# Patient Record
Sex: Male | Born: 1935 | Race: White | Hispanic: No | Marital: Married | Smoking: Former smoker
Health system: Southern US, Community
[De-identification: ages and names within clinical notes are randomized; demographics above are authoritative.]

## PROBLEM LIST (undated history)

## (undated) DIAGNOSIS — I471 Supraventricular tachycardia, unspecified: Secondary | ICD-10-CM

## (undated) DIAGNOSIS — K219 Gastro-esophageal reflux disease without esophagitis: Secondary | ICD-10-CM

## (undated) DIAGNOSIS — E785 Hyperlipidemia, unspecified: Secondary | ICD-10-CM

## (undated) DIAGNOSIS — K222 Esophageal obstruction: Secondary | ICD-10-CM

## (undated) DIAGNOSIS — I10 Essential (primary) hypertension: Secondary | ICD-10-CM

## (undated) DIAGNOSIS — N400 Enlarged prostate without lower urinary tract symptoms: Secondary | ICD-10-CM

## (undated) DIAGNOSIS — I2510 Atherosclerotic heart disease of native coronary artery without angina pectoris: Secondary | ICD-10-CM

## (undated) DIAGNOSIS — I6529 Occlusion and stenosis of unspecified carotid artery: Secondary | ICD-10-CM

## (undated) DIAGNOSIS — D126 Benign neoplasm of colon, unspecified: Secondary | ICD-10-CM

## (undated) DIAGNOSIS — K4090 Unilateral inguinal hernia, without obstruction or gangrene, not specified as recurrent: Secondary | ICD-10-CM

## (undated) DIAGNOSIS — N529 Male erectile dysfunction, unspecified: Secondary | ICD-10-CM

## (undated) DIAGNOSIS — K5790 Diverticulosis of intestine, part unspecified, without perforation or abscess without bleeding: Secondary | ICD-10-CM

## (undated) DIAGNOSIS — K589 Irritable bowel syndrome without diarrhea: Secondary | ICD-10-CM

## (undated) DIAGNOSIS — E119 Type 2 diabetes mellitus without complications: Secondary | ICD-10-CM

## (undated) DIAGNOSIS — K449 Diaphragmatic hernia without obstruction or gangrene: Secondary | ICD-10-CM

## (undated) DIAGNOSIS — F419 Anxiety disorder, unspecified: Secondary | ICD-10-CM

## (undated) HISTORY — DX: Diaphragmatic hernia without obstruction or gangrene: K44.9

## (undated) HISTORY — PX: INGUINAL HERNIA REPAIR: SUR1180

## (undated) HISTORY — DX: Diverticulosis of intestine, part unspecified, without perforation or abscess without bleeding: K57.90

## (undated) HISTORY — DX: Supraventricular tachycardia: I47.1

## (undated) HISTORY — DX: Anxiety disorder, unspecified: F41.9

## (undated) HISTORY — DX: Atherosclerotic heart disease of native coronary artery without angina pectoris: I25.10

## (undated) HISTORY — DX: Benign neoplasm of colon, unspecified: D12.6

## (undated) HISTORY — DX: Gastro-esophageal reflux disease without esophagitis: K21.9

## (undated) HISTORY — DX: Esophageal obstruction: K22.2

## (undated) HISTORY — DX: Irritable bowel syndrome, unspecified: K58.9

## (undated) HISTORY — DX: Benign prostatic hyperplasia without lower urinary tract symptoms: N40.0

## (undated) HISTORY — DX: Occlusion and stenosis of unspecified carotid artery: I65.29

## (undated) HISTORY — DX: Essential (primary) hypertension: I10

## (undated) HISTORY — DX: Supraventricular tachycardia, unspecified: I47.10

## (undated) HISTORY — DX: Male erectile dysfunction, unspecified: N52.9

## (undated) HISTORY — DX: Hyperlipidemia, unspecified: E78.5

## (undated) HISTORY — DX: Type 2 diabetes mellitus without complications: E11.9

## (undated) HISTORY — DX: Unilateral inguinal hernia, without obstruction or gangrene, not specified as recurrent: K40.90

---

## 2004-05-18 DIAGNOSIS — D126 Benign neoplasm of colon, unspecified: Secondary | ICD-10-CM

## 2004-05-18 HISTORY — DX: Benign neoplasm of colon, unspecified: D12.6

## 2004-06-14 LAB — HM COLONOSCOPY

## 2004-11-28 ENCOUNTER — Ambulatory Visit: Payer: Self-pay | Admitting: Internal Medicine

## 2004-12-17 ENCOUNTER — Ambulatory Visit: Payer: Self-pay | Admitting: Internal Medicine

## 2005-01-09 ENCOUNTER — Ambulatory Visit: Payer: Self-pay | Admitting: Internal Medicine

## 2005-04-25 ENCOUNTER — Ambulatory Visit: Payer: Self-pay | Admitting: Internal Medicine

## 2005-07-25 ENCOUNTER — Ambulatory Visit: Payer: Self-pay | Admitting: Internal Medicine

## 2005-10-23 ENCOUNTER — Ambulatory Visit: Payer: Self-pay | Admitting: Internal Medicine

## 2006-07-08 ENCOUNTER — Ambulatory Visit: Payer: Self-pay | Admitting: Internal Medicine

## 2006-11-19 ENCOUNTER — Ambulatory Visit: Payer: Self-pay | Admitting: Internal Medicine

## 2006-11-19 LAB — CONVERTED CEMR LAB
AST: 25 units/L (ref 0–37)
Albumin: 3.9 g/dL (ref 3.5–5.2)
BUN: 17 mg/dL (ref 6–23)
Bilirubin Urine: NEGATIVE
CO2: 30 meq/L (ref 19–32)
Calcium: 9.3 mg/dL (ref 8.4–10.5)
Chol/HDL Ratio, serum: 4.4
Cholesterol: 148 mg/dL (ref 0–200)
Creatinine, Ser: 1.1 mg/dL (ref 0.4–1.5)
Eosinophil percent: 3.8 % (ref 0.0–5.0)
Glucose, Bld: 135 mg/dL — ABNORMAL HIGH (ref 70–99)
Hemoglobin: 15 g/dL (ref 13.0–17.0)
Hgb A1c MFr Bld: 6.3 % — ABNORMAL HIGH (ref 4.6–6.0)
LDL Cholesterol: 89 mg/dL (ref 0–99)
Lymphocytes Relative: 21.2 % (ref 12.0–46.0)
MCV: 86.7 fL (ref 78.0–100.0)
Monocytes Relative: 8.4 % (ref 3.0–11.0)
Neutro Abs: 3.9 10*3/uL (ref 1.4–7.7)
Neutrophils Relative %: 65.8 % (ref 43.0–77.0)
PSA: 2.38 ng/mL
Potassium: 4.2 meq/L (ref 3.5–5.1)
RBC: 5.22 M/uL (ref 4.22–5.81)
TSH: 1.67 microintl units/mL (ref 0.35–5.50)
Total Bilirubin: 0.8 mg/dL (ref 0.3–1.2)
Total Protein, Urine: NEGATIVE mg/dL
Urobilinogen, UA: 0.2 (ref 0.0–1.0)

## 2006-11-24 ENCOUNTER — Ambulatory Visit: Payer: Self-pay | Admitting: Internal Medicine

## 2006-11-28 ENCOUNTER — Ambulatory Visit: Payer: Self-pay

## 2006-11-28 ENCOUNTER — Ambulatory Visit: Payer: Self-pay | Admitting: Cardiovascular Disease

## 2006-12-15 ENCOUNTER — Ambulatory Visit: Payer: Self-pay | Admitting: Cardiovascular Disease

## 2007-07-22 ENCOUNTER — Ambulatory Visit: Payer: Self-pay | Admitting: Internal Medicine

## 2007-07-22 ENCOUNTER — Encounter: Payer: Self-pay | Admitting: Internal Medicine

## 2007-07-22 DIAGNOSIS — K219 Gastro-esophageal reflux disease without esophagitis: Secondary | ICD-10-CM | POA: Insufficient documentation

## 2007-07-22 DIAGNOSIS — E119 Type 2 diabetes mellitus without complications: Secondary | ICD-10-CM | POA: Insufficient documentation

## 2007-07-22 DIAGNOSIS — J310 Chronic rhinitis: Secondary | ICD-10-CM | POA: Insufficient documentation

## 2007-07-22 DIAGNOSIS — Z8719 Personal history of other diseases of the digestive system: Secondary | ICD-10-CM

## 2007-07-22 DIAGNOSIS — K5730 Diverticulosis of large intestine without perforation or abscess without bleeding: Secondary | ICD-10-CM | POA: Insufficient documentation

## 2007-07-22 DIAGNOSIS — E785 Hyperlipidemia, unspecified: Secondary | ICD-10-CM | POA: Insufficient documentation

## 2007-07-22 DIAGNOSIS — Z860100 Personal history of colon polyps, unspecified: Secondary | ICD-10-CM | POA: Insufficient documentation

## 2007-07-22 DIAGNOSIS — I10 Essential (primary) hypertension: Secondary | ICD-10-CM | POA: Insufficient documentation

## 2007-07-22 DIAGNOSIS — M545 Low back pain: Secondary | ICD-10-CM

## 2007-07-22 DIAGNOSIS — F528 Other sexual dysfunction not due to a substance or known physiological condition: Secondary | ICD-10-CM

## 2007-07-22 DIAGNOSIS — K589 Irritable bowel syndrome without diarrhea: Secondary | ICD-10-CM

## 2007-07-22 LAB — CONVERTED CEMR LAB
Crystals: NEGATIVE
Hemoglobin, Urine: NEGATIVE
Ketones, ur: NEGATIVE mg/dL
Leukocytes, UA: NEGATIVE
Mucus, UA: NEGATIVE
Urine Glucose: NEGATIVE mg/dL

## 2007-07-23 ENCOUNTER — Encounter: Payer: Self-pay | Admitting: Internal Medicine

## 2007-07-27 ENCOUNTER — Encounter: Admission: RE | Admit: 2007-07-27 | Discharge: 2007-07-27 | Payer: Self-pay | Admitting: Internal Medicine

## 2007-10-28 ENCOUNTER — Ambulatory Visit: Payer: Self-pay | Admitting: Internal Medicine

## 2007-10-28 DIAGNOSIS — F411 Generalized anxiety disorder: Secondary | ICD-10-CM | POA: Insufficient documentation

## 2007-10-28 DIAGNOSIS — K222 Esophageal obstruction: Secondary | ICD-10-CM | POA: Insufficient documentation

## 2007-10-28 DIAGNOSIS — Z87442 Personal history of urinary calculi: Secondary | ICD-10-CM | POA: Insufficient documentation

## 2007-11-26 ENCOUNTER — Ambulatory Visit: Payer: Self-pay | Admitting: Internal Medicine

## 2007-11-27 LAB — CONVERTED CEMR LAB
BUN: 24 mg/dL — ABNORMAL HIGH (ref 6–23)
Bilirubin, Direct: 0.1 mg/dL (ref 0.0–0.3)
CO2: 27 meq/L (ref 19–32)
Calcium: 9.2 mg/dL (ref 8.4–10.5)
Chloride: 99 meq/L (ref 96–112)
Direct LDL: 114.3 mg/dL
Eosinophils Absolute: 0.2 10*3/uL (ref 0.0–0.6)
Eosinophils Relative: 2.2 % (ref 0.0–5.0)
Glucose, Bld: 129 mg/dL — ABNORMAL HIGH (ref 70–99)
HCT: 46.1 % (ref 39.0–52.0)
Ketones, ur: NEGATIVE mg/dL
Leukocytes, UA: NEGATIVE
MCV: 86.7 fL (ref 78.0–100.0)
Neutro Abs: 4.8 10*3/uL (ref 1.4–7.7)
Neutrophils Relative %: 63.4 % (ref 43.0–77.0)
Nitrite: NEGATIVE
PSA: 2.1 ng/mL (ref 0.10–4.00)
Platelets: 188 10*3/uL (ref 150–400)
Potassium: 4.2 meq/L (ref 3.5–5.1)
Total Protein, Urine: NEGATIVE mg/dL
Urobilinogen, UA: 0.2 (ref 0.0–1.0)
VLDL: 57 mg/dL — ABNORMAL HIGH (ref 0–40)
pH: 6 (ref 5.0–8.0)

## 2007-11-30 ENCOUNTER — Ambulatory Visit: Payer: Self-pay | Admitting: Internal Medicine

## 2008-03-31 ENCOUNTER — Ambulatory Visit: Payer: Self-pay | Admitting: Internal Medicine

## 2008-03-31 DIAGNOSIS — R350 Frequency of micturition: Secondary | ICD-10-CM

## 2008-04-01 LAB — CONVERTED CEMR LAB
BUN: 26 mg/dL — ABNORMAL HIGH (ref 6–23)
CO2: 30 meq/L (ref 19–32)
Calcium: 9.7 mg/dL (ref 8.4–10.5)
Chloride: 104 meq/L (ref 96–112)
Creatinine, Ser: 1.1 mg/dL (ref 0.4–1.5)
Direct LDL: 115.7 mg/dL
Glucose, Bld: 310 mg/dL — ABNORMAL HIGH (ref 70–99)
Hgb A1c MFr Bld: 9.8 % — ABNORMAL HIGH (ref 4.6–6.0)
Ketones, ur: 15 mg/dL — AB
Triglycerides: 320 mg/dL (ref 0–149)
Urine Glucose: >=1000 mg/dL — CR
Urobilinogen, UA: 1 (ref 0.0–1.0)
VLDL: 64 mg/dL — ABNORMAL HIGH (ref 0–40)

## 2008-04-06 ENCOUNTER — Encounter: Payer: Self-pay | Admitting: Internal Medicine

## 2008-05-04 ENCOUNTER — Ambulatory Visit: Payer: Self-pay | Admitting: Internal Medicine

## 2008-05-04 DIAGNOSIS — M79609 Pain in unspecified limb: Secondary | ICD-10-CM

## 2008-06-08 ENCOUNTER — Ambulatory Visit: Payer: Self-pay | Admitting: Internal Medicine

## 2008-06-09 LAB — CONVERTED CEMR LAB
AST: 18 units/L (ref 0–37)
Albumin: 3.9 g/dL (ref 3.5–5.2)
BUN: 28 mg/dL — ABNORMAL HIGH (ref 6–23)
Bilirubin, Direct: 0.1 mg/dL (ref 0.0–0.3)
Chloride: 107 meq/L (ref 96–112)
Cholesterol: 112 mg/dL (ref 0–200)
Creatinine, Ser: 1.1 mg/dL (ref 0.4–1.5)
GFR calc non Af Amer: 70 mL/min
Hgb A1c MFr Bld: 7.5 % — ABNORMAL HIGH (ref 4.6–6.0)
Triglycerides: 105 mg/dL (ref 0–149)
VLDL: 21 mg/dL (ref 0–40)

## 2008-06-16 ENCOUNTER — Telehealth (INDEPENDENT_AMBULATORY_CARE_PROVIDER_SITE_OTHER): Payer: Self-pay | Admitting: *Deleted

## 2008-09-22 ENCOUNTER — Ambulatory Visit: Payer: Self-pay | Admitting: Gastroenterology

## 2008-10-06 ENCOUNTER — Encounter: Payer: Self-pay | Admitting: Internal Medicine

## 2008-10-06 ENCOUNTER — Ambulatory Visit: Payer: Self-pay | Admitting: Gastroenterology

## 2008-10-06 ENCOUNTER — Encounter: Payer: Self-pay | Admitting: Gastroenterology

## 2008-10-07 ENCOUNTER — Encounter: Payer: Self-pay | Admitting: Gastroenterology

## 2008-10-18 HISTORY — PX: CORONARY STENT PLACEMENT: SHX1402

## 2008-10-28 ENCOUNTER — Ambulatory Visit: Payer: Self-pay | Admitting: Internal Medicine

## 2008-10-28 DIAGNOSIS — K4090 Unilateral inguinal hernia, without obstruction or gangrene, not specified as recurrent: Secondary | ICD-10-CM | POA: Insufficient documentation

## 2008-11-02 DIAGNOSIS — I2510 Atherosclerotic heart disease of native coronary artery without angina pectoris: Secondary | ICD-10-CM

## 2008-11-02 DIAGNOSIS — I25119 Atherosclerotic heart disease of native coronary artery with unspecified angina pectoris: Secondary | ICD-10-CM | POA: Insufficient documentation

## 2008-11-02 HISTORY — DX: Atherosclerotic heart disease of native coronary artery with unspecified angina pectoris: I25.119

## 2008-11-03 ENCOUNTER — Encounter: Payer: Self-pay | Admitting: Internal Medicine

## 2008-11-03 ENCOUNTER — Encounter (INDEPENDENT_AMBULATORY_CARE_PROVIDER_SITE_OTHER): Payer: Self-pay | Admitting: *Deleted

## 2008-11-03 ENCOUNTER — Ambulatory Visit: Payer: Self-pay

## 2008-11-07 ENCOUNTER — Telehealth: Payer: Self-pay | Admitting: Internal Medicine

## 2008-11-07 DIAGNOSIS — R9439 Abnormal result of other cardiovascular function study: Secondary | ICD-10-CM | POA: Insufficient documentation

## 2008-11-22 ENCOUNTER — Ambulatory Visit: Payer: Self-pay | Admitting: Cardiovascular Disease

## 2008-11-22 LAB — CONVERTED CEMR LAB
Basophils Relative: 0.6 % (ref 0.0–3.0)
Chloride: 107 meq/L (ref 96–112)
Creatinine, Ser: 1.1 mg/dL (ref 0.4–1.5)
Eosinophils Absolute: 0.2 10*3/uL (ref 0.0–0.7)
Eosinophils Relative: 2.4 % (ref 0.0–5.0)
GFR calc Af Amer: 84 mL/min
Glucose, Bld: 138 mg/dL — ABNORMAL HIGH (ref 70–99)
HCT: 46.1 % (ref 39.0–52.0)
Hemoglobin: 16.1 g/dL (ref 13.0–17.0)
Lymphocytes Relative: 24.3 % (ref 12.0–46.0)
MCHC: 35 g/dL (ref 30.0–36.0)
MCV: 86.9 fL (ref 78.0–100.0)
Monocytes Relative: 10.2 % (ref 3.0–12.0)
Platelets: 165 10*3/uL (ref 150–400)
Prothrombin Time: 10.5 s — ABNORMAL LOW (ref 10.9–13.3)

## 2008-11-25 ENCOUNTER — Ambulatory Visit: Payer: Self-pay | Admitting: Cardiovascular Disease

## 2008-11-25 ENCOUNTER — Inpatient Hospital Stay (HOSPITAL_BASED_OUTPATIENT_CLINIC_OR_DEPARTMENT_OTHER): Admission: RE | Admit: 2008-11-25 | Discharge: 2008-11-25 | Payer: Self-pay | Admitting: Cardiovascular Disease

## 2008-12-07 ENCOUNTER — Encounter: Payer: Self-pay | Admitting: Internal Medicine

## 2008-12-09 ENCOUNTER — Ambulatory Visit: Payer: Self-pay | Admitting: Cardiovascular Disease

## 2008-12-13 ENCOUNTER — Ambulatory Visit: Payer: Self-pay | Admitting: Cardiovascular Disease

## 2008-12-13 ENCOUNTER — Inpatient Hospital Stay (HOSPITAL_COMMUNITY): Admission: RE | Admit: 2008-12-13 | Discharge: 2008-12-14 | Payer: Self-pay | Admitting: Cardiovascular Disease

## 2008-12-20 ENCOUNTER — Ambulatory Visit: Payer: Self-pay | Admitting: Internal Medicine

## 2008-12-21 LAB — CONVERTED CEMR LAB
ALT: 24 units/L (ref 0–53)
Albumin: 4 g/dL (ref 3.5–5.2)
Basophils Absolute: 0.1 10*3/uL (ref 0.0–0.1)
Basophils Relative: 1.1 % (ref 0.0–3.0)
Bilirubin Urine: NEGATIVE
Cholesterol: 116 mg/dL (ref 0–200)
Creatinine, Ser: 1.2 mg/dL (ref 0.4–1.5)
Creatinine,U: 166.4 mg/dL
GFR calc Af Amer: 76 mL/min
HCT: 47.3 % (ref 39.0–52.0)
Hemoglobin: 16.4 g/dL (ref 13.0–17.0)
LDL Cholesterol: 48 mg/dL (ref 0–99)
Leukocytes, UA: NEGATIVE
Lymphocytes Relative: 16.1 % (ref 12.0–46.0)
Microalb, Ur: 1.8 mg/dL (ref 0.0–1.9)
Neutrophils Relative %: 72.9 % (ref 43.0–77.0)
Nitrite: NEGATIVE
PSA: 2.91 ng/mL (ref 0.10–4.00)
RBC: 5.41 M/uL (ref 4.22–5.81)
Sodium: 140 meq/L (ref 135–145)
Total Bilirubin: 0.6 mg/dL (ref 0.3–1.2)
Total Protein, Urine: NEGATIVE mg/dL
Total Protein: 6.8 g/dL (ref 6.0–8.3)
Urine Glucose: NEGATIVE mg/dL
Urobilinogen, UA: 1 (ref 0.0–1.0)
VLDL: 39 mg/dL (ref 0–40)
WBC: 7.9 10*3/uL (ref 4.5–10.5)

## 2008-12-29 ENCOUNTER — Encounter: Payer: Self-pay | Admitting: Cardiovascular Disease

## 2008-12-29 ENCOUNTER — Ambulatory Visit: Payer: Self-pay | Admitting: Cardiovascular Disease

## 2009-01-31 ENCOUNTER — Telehealth (INDEPENDENT_AMBULATORY_CARE_PROVIDER_SITE_OTHER): Payer: Self-pay | Admitting: *Deleted

## 2009-07-19 ENCOUNTER — Telehealth: Payer: Self-pay | Admitting: Internal Medicine

## 2009-07-31 ENCOUNTER — Ambulatory Visit: Payer: Self-pay | Admitting: Internal Medicine

## 2009-07-31 ENCOUNTER — Ambulatory Visit: Payer: Self-pay | Admitting: Cardiovascular Disease

## 2009-07-31 DIAGNOSIS — M549 Dorsalgia, unspecified: Secondary | ICD-10-CM | POA: Insufficient documentation

## 2009-11-21 ENCOUNTER — Telehealth: Payer: Self-pay | Admitting: Cardiovascular Disease

## 2009-11-29 ENCOUNTER — Encounter: Payer: Self-pay | Admitting: Internal Medicine

## 2009-11-30 ENCOUNTER — Encounter: Payer: Self-pay | Admitting: Cardiovascular Disease

## 2009-12-19 ENCOUNTER — Telehealth (INDEPENDENT_AMBULATORY_CARE_PROVIDER_SITE_OTHER): Payer: Self-pay | Admitting: *Deleted

## 2009-12-21 ENCOUNTER — Telehealth (INDEPENDENT_AMBULATORY_CARE_PROVIDER_SITE_OTHER): Payer: Self-pay | Admitting: *Deleted

## 2009-12-21 ENCOUNTER — Ambulatory Visit: Payer: Self-pay | Admitting: Internal Medicine

## 2009-12-25 ENCOUNTER — Ambulatory Visit: Payer: Self-pay

## 2009-12-25 ENCOUNTER — Encounter (HOSPITAL_COMMUNITY): Admission: RE | Admit: 2009-12-25 | Discharge: 2010-02-21 | Payer: Self-pay | Admitting: Cardiovascular Disease

## 2009-12-25 ENCOUNTER — Ambulatory Visit: Payer: Self-pay | Admitting: Cardiology

## 2010-02-16 ENCOUNTER — Ambulatory Visit: Payer: Self-pay | Admitting: Internal Medicine

## 2010-03-14 ENCOUNTER — Emergency Department (HOSPITAL_COMMUNITY): Admission: EM | Admit: 2010-03-14 | Discharge: 2010-03-14 | Payer: Self-pay | Admitting: Emergency Medicine

## 2010-03-14 ENCOUNTER — Telehealth: Payer: Self-pay | Admitting: Internal Medicine

## 2010-03-15 ENCOUNTER — Ambulatory Visit: Payer: Self-pay | Admitting: Cardiovascular Disease

## 2010-04-19 ENCOUNTER — Ambulatory Visit: Payer: Self-pay | Admitting: Internal Medicine

## 2010-04-19 DIAGNOSIS — R197 Diarrhea, unspecified: Secondary | ICD-10-CM

## 2010-06-26 ENCOUNTER — Ambulatory Visit: Payer: Self-pay | Admitting: Internal Medicine

## 2010-06-26 DIAGNOSIS — N400 Enlarged prostate without lower urinary tract symptoms: Secondary | ICD-10-CM | POA: Insufficient documentation

## 2010-06-26 LAB — CONVERTED CEMR LAB
CO2: 30 meq/L (ref 19–32)
Calcium: 9.8 mg/dL (ref 8.4–10.5)
Glucose, Bld: 126 mg/dL — ABNORMAL HIGH (ref 70–99)
Potassium: 4.8 meq/L (ref 3.5–5.1)
Sodium: 143 meq/L (ref 135–145)

## 2010-12-14 ENCOUNTER — Ambulatory Visit
Admission: RE | Admit: 2010-12-14 | Discharge: 2010-12-14 | Payer: Self-pay | Source: Home / Self Care | Attending: Internal Medicine | Admitting: Internal Medicine

## 2010-12-14 ENCOUNTER — Other Ambulatory Visit: Payer: Self-pay | Admitting: Internal Medicine

## 2010-12-14 DIAGNOSIS — R079 Chest pain, unspecified: Secondary | ICD-10-CM | POA: Insufficient documentation

## 2010-12-14 DIAGNOSIS — R109 Unspecified abdominal pain: Secondary | ICD-10-CM | POA: Insufficient documentation

## 2010-12-16 LAB — CONVERTED CEMR LAB
ALT: 29 units/L (ref 0–53)
BUN: 19 mg/dL (ref 6–23)
Basophils Relative: 0.1 % (ref 0.0–3.0)
Bilirubin Urine: NEGATIVE
Calcium: 9.5 mg/dL (ref 8.4–10.5)
Cholesterol: 153 mg/dL (ref 0–200)
Creatinine, Ser: 1 mg/dL (ref 0.4–1.5)
Eosinophils Absolute: 0.1 10*3/uL (ref 0.0–0.7)
Eosinophils Relative: 2.3 % (ref 0.0–5.0)
Glucose, Bld: 138 mg/dL — ABNORMAL HIGH (ref 70–99)
HCT: 48.8 % (ref 39.0–52.0)
Hemoglobin, Urine: NEGATIVE
Ketones, ur: NEGATIVE mg/dL
Lymphocytes Relative: 19.2 % (ref 12.0–46.0)
Microalb, Ur: 1 mg/dL (ref 0.0–1.9)
Monocytes Relative: 7.3 % (ref 3.0–12.0)
Nitrite: NEGATIVE
PSA: 2.4 ng/mL (ref 0.10–4.00)
Platelets: 174 10*3/uL (ref 150.0–400.0)
RBC: 5.32 M/uL (ref 4.22–5.81)
Total Bilirubin: 0.8 mg/dL (ref 0.3–1.2)
Total CHOL/HDL Ratio: 4
Total Protein, Urine: NEGATIVE mg/dL
Triglycerides: 120 mg/dL (ref 0.0–149.0)
Urine Glucose: NEGATIVE mg/dL
Urobilinogen, UA: 0.2 (ref 0.0–1.0)
WBC: 6 10*3/uL (ref 4.5–10.5)

## 2010-12-19 ENCOUNTER — Ambulatory Visit
Admission: RE | Admit: 2010-12-19 | Discharge: 2010-12-19 | Disposition: A | Discharge status: Home / Self Care | Payer: Medicare HMO | Source: Ambulatory Visit | Attending: Internal Medicine | Admitting: Internal Medicine

## 2010-12-19 NOTE — Progress Notes (Signed)
Summary: Nuclear Pre-Procedure  Phone Note Outgoing Call Call back at [facility] Phone [phone] Call back at [phone] Cell   Call placed by: Stanton Kidney, EMT-P,  December 21, 2009 12:54 PM Action Taken: Phone Call Completed Summary of Call: Reviewed information on Myoview Information Sheet (see scanned document for further details).  Spoke with Patient on cell#.     Nuclear Med Background Indications for Stress Test: Evaluation for Ischemia, Stent Patency   History: Heart Catheterization, Myocardial Perfusion Study, Stents  History Comments: 11/12/08 MPS: EF= 67%, abnormal, sm. apical anterolateral ischemia > Heart Cath: Severe LAD > Stents: LAD, DX1     Nuclear Pre-Procedure Cardiac Risk Factors: History of Smoking, Hypertension, Lipids, NIDDM Height (in): 72

## 2010-12-19 NOTE — Miscellaneous (Signed)
Summary: Flu Vaccination/[facility]  Flu Vaccination/[facility]   Imported By: Sherian Rein 12/14/2009 11:52:10  _____________________________________________________________________  External Attachment:    Type:   Image     Comment:   External Document

## 2010-12-19 NOTE — Assessment & Plan Note (Signed)
Summary: YEARLY FU/ LABS AFTER / MEDICARE/ NWS  #   Vital Signs:  Patient profile:   77 year old male Height:      70 inches Weight:      170 pounds BMI:     24.48 O2 Sat:      98 % on Room air Temp:     97 degrees F oral Pulse rate:   68 / minute BP sitting:   138 / 70  (left arm) Cuff size:   regular  Vitals Entered ByZella Ball Ewing (December 21, 2009 8:39 AM)  O2 Flow:  Room air  CC: yearly/RE   Primary Care Provider:  [name] MD  CC:  yearly/RE.  History of Present Illness: to see dr cooper mon ofr stress test ocnsideration and ? come off plavix;  needs DOT form filled out;  overall doing well, Pt denies CP, sob, doe, wheezing, orthopnea, pnd, worsening LE edema, palps, dizziness or syncope   Pt denies new neuro symptoms such as headache, facial or extremity weakness  Pt denies polydipsia, polyuria, or low sugar symptoms such as shakiness improved with eating.  Overall good compliance with meds, trying to follow low chol, DM diet, wt stable, little excercise however CBG;'s range form 80 to 140.  Wt overall stable - running in the low 170 's for severeal years after some initial wt loss years ago from approx 205 when fist started here. 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  Azor now too expensive due to his insurance wont pay,  Lipitor now too expensive as well.  wants to avoid diuretic effect due to his driving job.    Problems Prior to Update: 1)  Rash-nonvesicular  (ICD-782.1) 2)  Back Pain  (ICD-724.5) 3)  Preventive Health Care  (ICD-V70.0) 4)  Cad, Native Vessel  (ICD-414.01) 5)  Myocardial Perfusion Scan, With Stress Test, Abnormal  (ICD-794.39) 6)  Preoperative Examination  (ICD-V72.84) 7)  Inguinal Hernia, Right  (ICD-550.90) 8)  Leg Pain, Left  (ICD-729.5) 9)  Frequency, Urinary  (ICD-788.41) 10)  Preventive Health Care   (ICD-V70.0) 11)  Special Screening Malignant Neoplasm of Prostate  (ICD-V76.44) 12)  Routine General Medical Exam@health  Care Facl  (ICD-V70.0) 13)  Esophageal Stricture  (ICD-530.3) 14)  Anxiety  (ICD-300.00) 15)  Rash-nonvesicular  (ICD-782.1) 16)  Nephrolithiasis, Hx of  (ICD-V13.01) 17)  Diverticulosis, Colon  (ICD-562.10) 18)  Colonic Polyps, Hx of  (ICD-V12.72) 19)  Erectile Dysfunction  (ICD-302.72) 20)  Hypertension  (ICD-401.9) 21)  Gerd  (ICD-530.81) 22)  Allergic Rhinitis  (ICD-477.9) 23)  Ibs  (ICD-564.1) 24)  Hemorrhoids, Hx of  (ICD-V12.79) 25)  Low Back Pain  (ICD-724.2) 26)  Hyperlipidemia  (ICD-272.4) 27)  Diabetes Mellitus, Type II  (ICD-250.00)  Medications Prior to Update: 1)  Metformin Hcl 500 Mg Tb24 (Metformin Hcl) .... 2 By Mouth Once Daily 2)  Viagra 100 Mg Tabs (Sildenafil Citrate) .... As Directed 3)  Azor 10-40 Mg  Tabs (Amlodipine-Olmesartan) .... Take 1 By Mouth Qd 4)  Lansoprazole 30 Mg Cpdr (Lansoprazole) 5)  Aspirin 81 Mg Tbec (Aspirin) .... Take One Tablet By Mouth Daily 6)  Onetouch Ultra Test   Strp (Glucose Blood) .... Use Asd Bid 7)  Onetouch Lancets   Misc (Lancets) .... Use Asd Bid 8)  Lipitor 10 Mg Tabs (Atorvastatin Calcium) .Marland KitchenMarland KitchenMarland Kitchen  1 By Mouth Once Daily 9)  Triamcinolone Acetonide 0.1 % Crea (Triamcinolone Acetonide) .... Use Asd Two Times A Day As Needed 10)  Plavix 75 Mg Tabs (Clopidogrel Bisulfate) .... Take One Tablet By Mouth Daily  Current Medications (verified): 1)  Metformin Hcl 500 Mg Tb24 (Metformin Hcl) .... 2 By Mouth Once Daily 2)  Viagra 100 Mg Tabs (Sildenafil Citrate) .... As Directed 3)  Amlodipine Besylate 10 Mg Tabs (Amlodipine Besylate) .Marland Kitchen.. 1 By Mouth Once Daily 4)  Lansoprazole 30 Mg Cpdr (Lansoprazole) .Marland Kitchen.. 1po Once Daily 5)  Aspirin 81 Mg Tbec (Aspirin) .... Take One Tablet By Mouth Daily 6)  Onetouch Ultra Test   Strp (Glucose Blood) .... Use Asd Bid 7)  Onetouch Lancets   Misc (Lancets) .... Use Asd Two Times A  Day 8)  Lipitor 10 Mg Tabs (Atorvastatin Calcium) .Marland Kitchen.. 1 By Mouth Once Daily 9)  Plavix 75 Mg Tabs (Clopidogrel Bisulfate) .... Take One Tablet By Mouth Daily 10)  Losartan Potassium 100 Mg Tabs (Losartan Potassium) .Marland Kitchen.. 1 By Mouth Once Daily  Allergies (verified): 1)  ! Mevacor  Past History:  Past Medical History: Last updated: 07/31/2009 Diabetes mellitus, type II Hyperlipidemia Low back pain H/O Hemorrhoids IBS Allergic rhinitis GERD Hypertension Erectile Dysfunction Colonic polyps, hx of Diverticulosis, colon Nephrolithiasis, hx of Anxiety esophageal stricture Coronary artery disease, S/P PCI of the LAD and Diagonal 10/2008 - drug-eluting stents  Past Surgical History: Last updated: 12/20/2008 s/p coronary stent x 19 Oct 2008  Family History: Last updated: Nov 24, 2007 mother died at 34 with cancer father died at 92 natural causes brother died at 31 unknown cause sister died at 40 with cancer  Social History: Last updated: 11/30/2007 Former Smoker Alcohol use-no Married 3 children  drives bus for [employer]  Risk Factors: Smoking Status: quit (11/24/07)  Review of Systems  The patient denies anorexia, fever, weight loss, weight gain, vision loss, decreased hearing, hoarseness, chest pain, syncope, dyspnea on exertion, peripheral edema, prolonged cough, headaches, hemoptysis, abdominal pain, melena, hematochezia, severe indigestion/heartburn, hematuria, incontinence, muscle weakness, suspicious skin lesions, transient blindness, difficulty walking, depression, unusual weight change, abnormal bleeding, enlarged lymph nodes, and angioedema.         all otherwise negative per pt -   Physical Exam  General:  alert and well-developed.   Head:  normocephalic and atraumatic.   Eyes:  vision grossly intact, pupils equal, and pupils round.   Ears:  R ear normal and L ear normal.   Nose:  no external deformity and no nasal discharge.   Mouth:  no gingival  abnormalities and pharynx pink and moist.   Neck:  supple and no masses.   Lungs:  normal respiratory effort and normal breath sounds.   Heart:  normal rate and regular rhythm.   Abdomen:  soft, non-tender, and normal bowel sounds.   Msk:  no joint tenderness and no joint swelling.   Extremities:  no edema, no erythema  Neurologic:  cranial nerves II-XII intact and strength normal in all extremities.   Psych:  not anxious appearing and not depressed appearing.     Impression & Recommendations:  Problem # 1:  Preventive Health Care (ICD-V70.0)  Overall doing well, age appropriate education and counseling updated and referral for appropriate preventive services done unless declined, immunizations up to date or declined, diet counseling done if overweight, urged to quit smoking if smokes , most recent labs reviewed and current ordered if appropriate, ecg reviewed or declined (interpretation per ECG  scanned in the EMR if done); information regarding Medicare Prevention requirements given if appropriate   Orders: EKG w/ Interpretation (93000) TLB-BMP (Basic Metabolic Panel-BMET) (80048-METABOL) TLB-CBC Platelet - w/Differential (85025-CBCD) TLB-Hepatic/Liver Function Pnl (80076-HEPATIC) TLB-Lipid Panel (80061-LIPID) TLB-TSH (Thyroid Stimulating Hormone) (84443-TSH) TLB-PSA (Prostate Specific Antigen) (84153-PSA) TLB-Udip ONLY (81003-UDIP)  Problem # 2:  HYPERTENSION (ICD-401.9)  His updated medication list for this problem includes:    Amlodipine Besylate 10 Mg Tabs (Amlodipine besylate) .Marland Kitchen... 1 by mouth once daily    Losartan Potassium 100 Mg Tabs (Losartan potassium) .Marland Kitchen... 1 by mouth once daily due to insurance and cost, we are forced to change as above  Problem # 3:  HYPERLIPIDEMIA (ICD-272.4)  His updated medication list for this problem includes:    Lipitor 10 Mg Tabs (Atorvastatin calcium) .Marland Kitchen... 1 by mouth once daily adivsed to try to stay with the lipitor for now as it will be  generic later this year  Problem # 4:  DIABETES MELLITUS, TYPE II (ICD-250.00)  His updated medication list for this problem includes:    Metformin Hcl 500 Mg Tb24 (Metformin hcl) .Marland Kitchen... 2 by mouth once daily    Aspirin 81 Mg Tbec (Aspirin) .Marland Kitchen... Take one tablet by mouth daily    Losartan Potassium 100 Mg Tabs (Losartan potassium) .Marland Kitchen... 1 by mouth once daily  Labs Reviewed: Creat: 1.2 (12/20/2008)    Reviewed HgBA1c results: 6.9 (12/20/2008)  7.5 (06/08/2008) stable overall by hx and exam, ok to continue meds/tx as is , Pt to cont DM diet, excercise, wt loss efforts; to check labs today   Orders: TLB-A1C / Hgb A1C (Glycohemoglobin) (83036-A1C) TLB-Microalbumin/Creat Ratio, Urine (82043-MALB)  Complete Medication List: 1)  Metformin Hcl 500 Mg Tb24 (Metformin hcl) .... 2 by mouth once daily 2)  Viagra 100 Mg Tabs (Sildenafil citrate) .... As directed 3)  Amlodipine Besylate 10 Mg Tabs (Amlodipine besylate) .Marland Kitchen.. 1 by mouth once daily 4)  Lansoprazole 30 Mg Cpdr (Lansoprazole) .Marland Kitchen.. 1po once daily 5)  Aspirin 81 Mg Tbec (Aspirin) .... Take one tablet by mouth daily 6)  Onetouch Ultra Test Strp (Glucose blood) .... Use asd bid 7)  Onetouch Lancets Misc (Lancets) .... Use asd two times a day 8)  Lipitor 10 Mg Tabs (Atorvastatin calcium) .Marland Kitchen.. 1 by mouth once daily 9)  Plavix 75 Mg Tabs (Clopidogrel bisulfate) .... Take one tablet by mouth daily 10)  Losartan Potassium 100 Mg Tabs (Losartan potassium) .Marland Kitchen.. 1 by mouth once daily  Patient Instructions: 1)  stop the azor when you run out 2)  after that, start the amlodipine 10 mg and the losartan 100 mg per day 3)  Continue all previous medications as before this visit  4)  you are given samples today 5)  your form is filled out today 6)  Please schedule a follow-up appointment in 6 months with: 7)  BMP prior to visit, ICD-9: 250.02 8)  Lipid Panel prior to visit, ICD-9: 9)  HbgA1C prior to visit, ICD-9: Prescriptions: PLAVIX 75 MG TABS  (CLOPIDOGREL BISULFATE) Take one tablet by mouth daily  #90 x 3   Entered and Authorized by:   [name] MD   Signed by:   [name] MD on 12/21/2009   Method used:   Print then Give to Patient   RxID:   [id number] ONETOUCH LANCETS   MISC (LANCETS) use asd two times a day  #200 x 11   Entered and Authorized by:   [name] MD   Signed  by:   [name] MD on 12/21/2009   Method used:   Print then Give to Patient   RxID:   [id number] ONETOUCH ULTRA TEST   STRP (GLUCOSE BLOOD) use asd bid  #200 x 11   Entered and Authorized by:   [name] MD   Signed by:   [name] MD on 12/21/2009   Method used:   Print then Give to Patient   RxID:   [id number] LANSOPRAZOLE 30 MG CPDR (LANSOPRAZOLE) 1po once daily  #90 x 3   Entered and Authorized by:   [name] MD   Signed by:   [name] MD on 12/21/2009   Method used:   Print then Give to Patient   RxID:   [id number] VIAGRA 100 MG TABS (SILDENAFIL CITRATE) As directed  #5 x 11   Entered and Authorized by:   [name] MD   Signed by:   [name] MD on 12/21/2009   Method used:   Print then Give to Patient   RxID:   [id number] METFORMIN HCL 500 MG TB24 (METFORMIN HCL) 2 by mouth once daily  #180 x 3   Entered and Authorized by:   [name] MD   Signed by:   [name] MD on 12/21/2009   Method used:   Print then Give to Patient   RxID:   [id number] JJOACZYS POTASSIUM 100 MG TABS (LOSARTAN POTASSIUM) 1 by mouth once daily  #90 x 3   Entered and Authorized by:   [name] MD   Signed by:   [name] MD on 12/21/2009   Method used:   Print then Give to Patient   RxID:   [id number] LIPITOR 10 MG TABS (ATORVASTATIN CALCIUM) 1 by mouth once daily  #90 x 3   Entered and Authorized by:   [name] MD   Signed by:   [name] MD on 12/21/2009   Method used:   Print then Give to Patient   RxID:   [id number] AMLODIPINE BESYLATE 10 MG TABS (AMLODIPINE BESYLATE)  1 by mouth once daily  #90 x 3   Entered and Authorized by:   [name] MD   Signed by:   [name] MD on 12/21/2009   Method used:   Print then Give to Patient   RxID:   [id number]    Immunization History:  Influenza Immunization History:    Influenza:  historical (11/18/2009)

## 2010-12-19 NOTE — Letter (Signed)
Summary: Department of Motor [employer]icles  [employer] [facility] [employer] [facility]   Imported By: Lester Marvin 12/22/2009 09:19:23  _____________________________________________________________________  External Attachment:    Type:   Image     Comment:   External Document

## 2010-12-19 NOTE — Assessment & Plan Note (Signed)
Summary: intestinal bug?/cd   Vital Signs:  Patient profile:   77 year old male Height:      72 inches Weight:      166 pounds BMI:     22.60 O2 Sat:      96 % on Room air Temp:     98.1 degrees F oral Pulse rate:   72 / minute BP sitting:   132 / 70  (left arm) Cuff size:   regular  Vitals Entered ByZella Ball Ewing (April 19, 2010 1:36 PM)  O2 Flow:  Room air CC: Diarrhea for 2 days, discuss medications/RE   Primary Care Provider:  [name] MD  CC:  Diarrhea for 2 days and discuss medications/RE.  History of Present Illness: here with acute visit for diarrhea despite kaopectate;  symptoms began x 3-4 days with multple episode watery stool, small and larger volumes, with mild crampy abd pains; mild nausea, no blood;  food seems to go right through, has significant gas and belching;  no vomiting, no fever, no recent sick contacts  mentions vicodin causes constipation so cannot take  also 5 wks ago in [place] was working on a ladder at the sons house, when the sons ladder broke and fell on his right side causing right clavicle fracture, seeing ortho, supposed to be back to work june 27  Pt denies CP, sob, doe, wheezing, orthopnea, pnd, worsening LE edema, palps, dizziness or syncope   Pt denies new neuro symptoms such as headache, facial or extremity weakness     Problems Prior to Update: 1)  Myocardial Perfusion Scan, With Stress Test, Abnormal  (ICD-794.39) 2)  Hypertension  (ICD-401.9) 3)  Hyperlipidemia  (ICD-272.4) 4)  Gerd  (ICD-530.81) 5)  Diabetes Mellitus, Type II  (ICD-250.00) 6)  Anxiety  (ICD-300.00) 7)  Leg Pain, Left  (ICD-729.5) 8)  Cad, Native Vessel  (ICD-414.01) 9)  Back Pain  (ICD-724.5) 10)  Rash-nonvesicular  (ICD-782.1) 11)  Back Pain  (ICD-724.5) 12)  Preventive Health Care  (ICD-V70.0) 13)  Preoperative Examination  (ICD-V72.84) 14)  Inguinal Hernia, Right  (ICD-550.90) 15)  Frequency, Urinary  (ICD-788.41) 16)  Preventive Health Care   (ICD-V70.0) 17)  Special Screening Malignant Neoplasm of Prostate  (ICD-V76.44) 18)  Routine General Medical Exam@health  Care Facl  (ICD-V70.0) 19)  Esophageal Stricture  (ICD-530.3) 20)  Rash-nonvesicular  (ICD-782.1) 21)  Nephrolithiasis, Hx of  (ICD-V13.01) 22)  Diverticulosis, Colon  (ICD-562.10) 23)  Colonic Polyps, Hx of  (ICD-V12.72) 24)  Erectile Dysfunction  (ICD-302.72) 25)  Allergic Rhinitis  (ICD-477.9) 26)  Ibs  (ICD-564.1) 27)  Hemorrhoids, Hx of  (ICD-V12.79) 28)  Low Back Pain  (ICD-724.2)  Medications Prior to Update: 1)  Metformin Hcl 500 Mg Tb24 (Metformin Hcl) .... 2 By Mouth Once Daily 2)  Viagra 100 Mg Tabs (Sildenafil Citrate) .... As Directed 3)  Amlodipine Besylate 10 Mg Tabs (Amlodipine Besylate) .Marland Kitchen.. 1 By Mouth Once Daily 4)  Lansoprazole 30 Mg Cpdr (Lansoprazole) .Marland Kitchen.. 1po Once Daily 5)  Aspirin 81 Mg Tbec (Aspirin) .... Take One Tablet By Mouth Daily 6)  Onetouch Ultra Test   Strp (Glucose Blood) .... Use Asd Bid 7)  Onetouch Lancets   Misc (Lancets) .... Use Asd Two Times A Day 8)  Lipitor 10 Mg Tabs (Atorvastatin Calcium) .Marland Kitchen.. 1 By Mouth Once Daily 9)  Plavix 75 Mg Tabs (Clopidogrel Bisulfate) .... Take One Tablet By Mouth Daily 10)  Losartan Potassium 100 Mg Tabs (Losartan Potassium) .Marland Kitchen.. 1 By Mouth  Once Daily 11)  Fluticasone Propionate 50 Mcg/act Susp (Fluticasone Propionate) .... 2 Spray/side Once Daily  Current Medications (verified): 1)  Metformin Hcl 500 Mg Tb24 (Metformin Hcl) .... 2 By Mouth Once Daily 2)  Viagra 100 Mg Tabs (Sildenafil Citrate) .... As Directed 3)  Amlodipine Besylate 10 Mg Tabs (Amlodipine Besylate) .Marland Kitchen.. 1 By Mouth Once Daily 4)  Lansoprazole 30 Mg Cpdr (Lansoprazole) .Marland Kitchen.. 1po Once Daily 5)  Aspirin 81 Mg Tbec (Aspirin) .... Take One Tablet By Mouth Daily 6)  Onetouch Ultra Test   Strp (Glucose Blood) .... Use Asd Bid 7)  Onetouch Lancets   Misc (Lancets) .... Use Asd Two Times A Day 8)  Lipitor 10 Mg Tabs (Atorvastatin  Calcium) .Marland Kitchen.. 1 By Mouth Once Daily 9)  Plavix 75 Mg Tabs (Clopidogrel Bisulfate) .... Take One Tablet By Mouth Daily 10)  Losartan Potassium 100 Mg Tabs (Losartan Potassium) .Marland Kitchen.. 1 By Mouth Once Daily 11)  Fluticasone Propionate 50 Mcg/act Susp (Fluticasone Propionate) .... 2 Spray/side Once Daily 12)  Lomotil 2.5-0.025 Mg Tabs (Diphenoxylate-Atropine) .Marland Kitchen.. 1 By Mouth As Needed Loose Stool - Max 8 Tabs Per 24 Hrs  Allergies (verified): 1)  ! Mevacor 2)  ! Crestor  Past History:  Past Medical History: Last updated: 03/14/2010 Current Problems:  MYOCARDIAL PERFUSION SCAN, WITH STRESS TEST, ABNORMAL (ICD-794.39) HYPERTENSION (ICD-401.9) HYPERLIPIDEMIA (ICD-272.4) GERD (ICD-530.81) DIABETES MELLITUS, TYPE II (ICD-250.00) ANXIETY (ICD-300.00) LEG PAIN, LEFT (ICD-729.5) CAD, NATIVE VESSEL (ICD-414.01) BACK PAIN (ICD-724.5) RASH-NONVESICULAR (ICD-782.1) BACK PAIN (ICD-724.5) PREVENTIVE HEALTH CARE (ICD-V70.0) PREOPERATIVE EXAMINATION (ICD-V72.84) INGUINAL HERNIA, RIGHT (ICD-550.90) FREQUENCY, URINARY (ICD-788.41) PREVENTIVE HEALTH CARE (ICD-V70.0) SPECIAL SCREENING MALIGNANT NEOPLASM OF PROSTATE (ICD-V76.44) ROUTINE GENERAL MEDICAL EXAM@HEALTH  CARE FACL (ICD-V70.0) ESOPHAGEAL STRICTURE (ICD-530.3) RASH-NONVESICULAR (ICD-782.1) NEPHROLITHIASIS, HX OF (ICD-V13.01) DIVERTICULOSIS, COLON (ICD-562.10) COLONIC POLYPS, HX OF (ICD-V12.72) ERECTILE DYSFUNCTION (ICD-302.72) ALLERGIC RHINITIS (ICD-477.9) IBS (ICD-564.1) HEMORRHOIDS, HX OF (ICD-V12.79) LOW BACK PAIN (ICD-724.2)  Past Surgical History: Last updated: 03/14/2010 s/p coronary stent x 19 Oct 2008  Social History: Last updated: 03/14/2010 Former Smoker Alcohol use-no 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   Head:  normocephalic and  atraumatic.   Eyes:  vision grossly intact, pupils equal, and pupils round.   Ears:  R ear normal and L ear normal.   Nose:  no external deformity and no nasal discharge.   Mouth:  no gingival abnormalities and pharynx pink and moist.   Neck:  supple and no masses.   Lungs:  normal respiratory effort and normal breath sounds.   Heart:  normal rate and regular rhythm.   Abdomen:  soft and normal bowel sounds.  with mild diffuse abd tender, without guarding, rebound Msk:  no joint tenderness and no joint swelling.  except for fractured right clavice deformity mild tender Extremities:  no edema, no erythema  Psych:  not depressed appearing and slightly anxious.     Impression & Recommendations:  Problem # 1:  DIARRHEA OF PRESUMED INFECTIOUS ORIGIN (ICD-009.3)  His updated medication list for this problem includes:    Lomotil 2.5-0.025 Mg Tabs (Diphenoxylate-atropine) .Marland Kitchen... 1 by mouth as needed loose stool - max 8 tabs per 24 hrs c/w viral;  treat as above, f/u any worsening signs or symptoms , currently afebrile and exam relatively benign  Problem # 2:  HYPERTENSION (ICD-401.9)  His updated medication list for this problem includes:    Amlodipine Besylate 10 Mg Tabs (Amlodipine besylate) .Marland Kitchen... 1 by mouth once daily    Losartan Potassium 100 Mg Tabs (Losartan potassium) .Marland Kitchen... 1 by mouth once daily  BP today: 132/70 Prior BP: 142/74 (03/15/2010)  Labs Reviewed: K+: 4.4 (12/21/2009) Creat: : 1.0 (12/21/2009)   Chol: 153 (12/21/2009)   HDL: 39.90 (12/21/2009)   LDL: 89 (12/21/2009)   TG: 120.0 (12/21/2009) improved and stable overall by hx and exam, ok to continue meds/tx as is   Problem # 3:  DIABETES MELLITUS, TYPE II (ICD-250.00)  His updated medication list for this problem includes:    Metformin Hcl 500 Mg Tb24 (Metformin hcl) .Marland Kitchen... 2 by mouth once daily    Aspirin 81 Mg Tbec (Aspirin) .Marland Kitchen... Take one tablet by mouth daily    Losartan Potassium 100 Mg Tabs (Losartan potassium)  .Marland Kitchen... 1 by mouth once daily  Labs Reviewed: Creat: 1.0 (12/21/2009)    Reviewed HgBA1c results: 6.6 (12/21/2009)  6.9 (12/20/2008) stable overall by hx and exam, ok to continue meds/tx as is, to call for polydipsia, polyuria or cbg > 200  Complete Medication List: 1)  Metformin Hcl 500 Mg Tb24 (Metformin hcl) .... 2 by mouth once daily 2)  Viagra 100 Mg Tabs (Sildenafil citrate) .... As directed 3)  Amlodipine Besylate 10 Mg Tabs (Amlodipine besylate) .Marland Kitchen.. 1 by mouth once daily 4)  Lansoprazole 30 Mg Cpdr (Lansoprazole) .Marland Kitchen.. 1po once daily 5)  Aspirin 81 Mg Tbec (Aspirin) .... Take one tablet by mouth daily 6)  Onetouch Ultra Test Strp (Glucose blood) .... Use asd bid 7)  Onetouch Lancets Misc (Lancets) .... Use asd two times a day 8)  Lipitor 10 Mg Tabs (Atorvastatin calcium) .Marland Kitchen.. 1 by mouth once daily 9)  Plavix 75 Mg Tabs (Clopidogrel bisulfate) .... Take one tablet by mouth daily 10)  Losartan Potassium 100 Mg Tabs (Losartan potassium) .Marland Kitchen.. 1 by mouth once daily 11)  Fluticasone Propionate 50 Mcg/act Susp (Fluticasone propionate) .... 2 spray/side once daily 12)  Lomotil 2.5-0.025 Mg Tabs (Diphenoxylate-atropine) .Marland Kitchen.. 1 by mouth as needed loose stool - max 8 tabs per 24 hrs  Patient Instructions: 1)  please hold on taking the kaopectate for now 2)  Please take all new medications as prescribed 3)  Continue all previous medications as before this visit  4)  Please schedule a follow-up appointment as needed, for any worsening or persistence Prescriptions: LOMOTIL 2.5-0.025 MG TABS (DIPHENOXYLATE-ATROPINE) 1 by mouth as needed loose stool - max 8 tabs per 24 hrs  #50 x 1   Entered and Authorized by:   [name] MD   Signed by:   [name] MD on 04/19/2010   Method used:   Print then Give to Patient   RxID:   [id number]

## 2010-12-19 NOTE — Assessment & Plan Note (Signed)
Summary: Cardiology Nuclear Study  Nuclear Med Background Indications for Stress Test: Evaluation for Ischemia, Stent Patency   History: Heart Catheterization, Myocardial Perfusion Study, Stents  History Comments: 12/09 ZOX:WRUEA apical antero-lateral ischemia, EF=67%>Stent-prox. LAD, DX1   Symptoms Comments: No cardiac complaints.   Nuclear Pre-Procedure Cardiac Risk Factors: History of Smoking, Hypertension, Lipids, NIDDM Caffeine/Decaff Intake: None NPO After: 8:00 PM Lungs: Clear IV 0.9% NS with Angio Cath: 22g     IV Site: (R) AC IV Started by: Irean Hong RN Chest Size (in) 42     Height (in): 72 Weight (lb): 169 BMI: 23.00  Nuclear Med Study 1 or 2 day study:  1 day     Stress Test Type:  Stress Reading MD:  Olga Millers, MD     Referring MD:  [name], MD Resting Radionuclide:  Technetium 7m Tetrofosmin     Resting Radionuclide Dose:  10.4 mCi  Stress Radionuclide:  Technetium 68m Tetrofosmin     Stress Radionuclide Dose:  33.0 mCi   Stress Protocol Exercise Time (min):  8:00 min     Max HR:  142 bpm     Predicted Max HR:  145 bpm  Max Systolic BP: 185 mm Hg     Percent Max HR:  97.93 %     METS: 10.1 Rate Pressure Product:  54098    Stress Test Technologist:  Rea College CMA-N     Nuclear Technologist:  Burna Mortimer Deal RT-N  Rest Procedure  Myocardial perfusion imaging was performed at rest 45 minutes following the intravenous administration of Myoview Technetium 12m Tetrofosmin.  Stress Procedure  The patient exercised for eight minutes.  The patient stopped due to fatigue and denied any chest pain.  There were  marked ST-T wave changes with exercise.  Myoview was injected at peak exercise and myocardial perfusion imaging was performed after a brief delay.  QPS Raw Data Images:  Acuisition technically good; normal left ventricular size. Stress Images:  There is decreased uptake in the inferoseptal wall. Rest Images:  There is decreased uptake in the  inferoseptal wall. Subtraction (SDS):  No evidence of ischemia. Transient Ischemic Dilatation:  1.03  (Normal <1.22)  Lung/Heart Ratio:  .23  (Normal <0.45)  Quantitative Gated Spect Images QGS EDV:  65 ml QGS ESV:  22 ml QGS EF:  66 % QGS cine images:  Normal wall motion.   Overall Impression  Exercise Capacity: Fair exercise capacity. BP Response: Normal blood pressure response. Clinical Symptoms: No chest pain ECG Impression: Significant ST abnormalities consistent with ischemia. Overall Impression: Low risk stress nuclear study with inferoseptal thinning; no ischemia.  Appended Document: Cardiology Nuclear Study low-risk study - continue current medical management.  Appended Document: Cardiology Nuclear Study Pt's wife aware of results by phone.  Pt has upcoming appt with Dr [name] on 01/16/10.

## 2010-12-19 NOTE — Assessment & Plan Note (Signed)
Summary: PER WIFE BACK PAIN  D/T  STC   Vital Signs:  Patient profile:   77 year old male Height:      72 inches Weight:      175 pounds BMI:     23.82 O2 Sat:      97 % on Room air Temp:     97.7 degrees F oral Pulse rate:   68 / minute BP sitting:   136 / 70  (left arm) Cuff size:   regular  Vitals Entered ByZella Ball Ewing (February 16, 2010 9:53 AM)  O2 Flow:  Room air  CC: Sinus congestion, prescription refills/RE   Primary Care Provider:  [name] MD  CC:  Sinus congestion and prescription refills/RE.  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 allergies involved, but seemed to start after getting a URI a few wks ago as well.  Claritin OTC no help.  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    asks for azor samples since he is here; Pt denies new neuro symptoms such as headache, facial or extremity weakness   also wtih rash flare to the bilat lower back areas with diffuse erythema, quite pruritic and similar to that he had in the past better with a steroid cream in the past per derm;  nonvesicaular and no fever,  pain  Problems Prior to Update: 1)  Back Pain  (ICD-724.5) 2)  Rash-nonvesicular  (ICD-782.1) 3)  Back Pain  (ICD-724.5) 4)  Preventive Health Care  (ICD-V70.0) 5)  Cad, Native Vessel  (ICD-414.01) 6)  Myocardial Perfusion Scan, With Stress Test, Abnormal  (ICD-794.39) 7)  Preoperative Examination  (ICD-V72.84) 8)  Inguinal Hernia, Right  (ICD-550.90) 9)  Leg Pain, Left  (ICD-729.5) 10)  Frequency, Urinary  (ICD-788.41) 11)  Preventive Health Care  (ICD-V70.0) 12)  Special Screening Malignant Neoplasm of Prostate  (ICD-V76.44) 13)  Routine General Medical Exam@health  Care [facility]  (ICD-V70.0) 14)  Esophageal Stricture  (ICD-530.3) 15)  Anxiety  (ICD-300.00) 16)  Rash-nonvesicular  (ICD-782.1) 17)  Nephrolithiasis, Hx of  (ICD-V13.01) 18)  Diverticulosis, Colon  (ICD-562.10) 19)  Colonic Polyps, Hx of  (ICD-V12.72) 20)  Erectile Dysfunction  (ICD-302.72) 21)  Hypertension  (ICD-401.9) 22)  Gerd  (ICD-530.81) 23)  Allergic Rhinitis  (ICD-477.9) 24)  Ibs  (ICD-564.1) 25)  Hemorrhoids, Hx of  (ICD-V12.79) 26)  Low Back Pain  (ICD-724.2) 27)  Hyperlipidemia  (ICD-272.4) 28)  Diabetes Mellitus, Type II  (ICD-250.00)  Medications Prior to Update: 1)  Metformin Hcl 500 Mg  Tb24 (Metformin Hcl) .... 2 By Mouth Once Daily 2)  Viagra 100 Mg Tabs (Sildenafil Citrate) .... As Directed 3)  Amlodipine Besylate 10 Mg Tabs (Amlodipine Besylate) .Marland Kitchen.. 1 By Mouth Once Daily 4)  Lansoprazole 30 Mg Cpdr (Lansoprazole) .Marland Kitchen.. 1po Once Daily 5)  Aspirin 81 Mg Tbec (Aspirin) .... Take One Tablet By Mouth Daily 6)  Onetouch Ultra Test   Strp (Glucose Blood) .... Use Asd Bid 7)  Onetouch Lancets   Misc (Lancets) .... Use Asd Two Times A Day 8)  Lipitor 10 Mg Tabs (Atorvastatin Calcium) .Marland Kitchen.. 1 By Mouth Once Daily 9)  Plavix 75 Mg Tabs (Clopidogrel Bisulfate) .... Take One Tablet By Mouth Daily 10)  Losartan Potassium 100 Mg Tabs (Losartan Potassium) .Marland Kitchen.. 1 By Mouth Once Daily  Current Medications  (verified): 1)  Metformin Hcl 500 Mg Tb24 (Metformin Hcl) .... 2 By Mouth Once Daily 2)  Viagra 100 Mg Tabs (Sildenafil Citrate) .... As Directed 3)  Amlodipine Besylate 10 Mg Tabs (Amlodipine Besylate) .Marland Kitchen.. 1 By Mouth Once Daily 4)  Lansoprazole 30 Mg Cpdr (Lansoprazole) .Marland Kitchen.. 1po Once Daily 5)  Aspirin 81 Mg Tbec (Aspirin) .... Take One Tablet By Mouth Daily 6)  Onetouch Ultra Test   Strp (Glucose Blood) .... Use Asd Bid 7)  Onetouch Lancets   Misc (Lancets) .... Use Asd Two Times A Day 8)  Lipitor 10 Mg Tabs (Atorvastatin Calcium) .Marland Kitchen.. 1 By Mouth Once Daily 9)  Plavix 75 Mg Tabs (Clopidogrel Bisulfate) .... Take One Tablet By Mouth Daily 10)  Losartan Potassium 100 Mg Tabs (Losartan Potassium) .Marland Kitchen.. 1 By Mouth Once Daily 11)  Fluticasone Propionate 50 Mcg/act Susp (Fluticasone Propionate) .... 2 Spray/side Once Daily 12)  Prednisone 10 Mg Tabs (Prednisone) .... 3po Qd For 3days, Then 2po Qd For 3days, Then 1po Qd For 3days, Then Stop  Allergies (verified): 1)  ! Mevacor 2)  ! Crestor  Past History:  Past Surgical History: Last updated: 12/20/2008 s/p coronary stent x 19 Oct 2008  Social History: Last updated: 11/30/2007 Former Smoker Alcohol use-no Married 3 children  drives bus for [employer]  Risk Factors: Smoking Status: quit (10/28/2007)  Past Medical History: Diabetes mellitus, type II Hyperlipidemia Low back pain H/O Hemorrhoids IBS Allergic rhinitis GERD Hypertension Erectile Dysfunction Colonic polyps, hx of Diverticulosis, colon Nephrolithiasis, hx of Anxiety esophageal stricture Coronary artery disease, S/P PCI of the LAD and Diagonal 10/2008 - drug-eluting stents rash - Dr nolan/derm  Review of Systems       all otherwise negative per pt -    Physical Exam  General:  alert and well-developed.   Head:  normocephalic and atraumatic.   Eyes:  vision grossly intact, pupils equal, and pupils round.   Ears:  bilat tm's mild red, sinus  nontender Nose:  nasal dischargemucosal pallor and mucosal edema.   Mouth:  pharyngeal erythema and fair dentition.   Neck:  supple and no masses.   Lungs:  normal respiratory effort and normal breath sounds.   Heart:  normal rate and regular rhythm.   Abdomen:  soft, non-tender, and normal bowel sounds.   Msk:  no joint tenderness and no joint swelling.  , does have some left lateral side tender just above the ischium  Extremities:  no edema, no erythema  Skin:  color normal.  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 2 spray/side once daily  Orders: Depo- Medrol 40mg  (J1030) Depo- Medrol 80mg  (J1040) Admin of Therapeutic Inj  intramuscular or subcutaneous (66440)  Problem # 3:  BACK PAIN (ICD-724.5)  His updated medication list for this problem includes:    Aspirin 81 Mg Tbec (Aspirin) .Marland Kitchen... Take one tablet by mouth daily c/w msk strain, ok for tylenol, has been takin advil as needed - to use sparingly if at all in the face of age and plavix use  Problem # 4:  HYPERTENSION (ICD-401.9)  His updated medication list for this problem includes:    Amlodipine Besylate 10 Mg Tabs (Amlodipine besylate) .Marland Kitchen... 1 by mouth once daily    Losartan Potassium 100 Mg Tabs (Losartan potassium) .Marland Kitchen... 1 by mouth once daily ok for azor samples today, to hold meds above while taking azor 10/40 - 1 per day  BP today: 136/70 Prior BP: 138/70 (12/21/2009)  Labs Reviewed: K+: 4.4 (12/21/2009) Creat: :  1.0 (12/21/2009)   Chol: 153 (12/21/2009)   HDL: 39.90 (12/21/2009)   LDL: 89 (12/21/2009)   TG: 120.0 (12/21/2009)  Complete Medication List: 1)  Metformin Hcl 500 Mg Tb24 (Metformin hcl) .... 2 by mouth once daily 2)  Viagra 100 Mg Tabs (Sildenafil citrate) .... As directed 3)  Amlodipine Besylate 10 Mg Tabs (Amlodipine besylate) .Marland Kitchen.. 1 by mouth once daily 4)  Lansoprazole 30 Mg Cpdr (Lansoprazole) .Marland Kitchen.. 1po once daily 5)  Aspirin 81 Mg Tbec (Aspirin) .... Take one tablet by mouth daily 6)  Onetouch Ultra Test Strp (Glucose blood) .... Use asd bid 7)  Onetouch Lancets Misc (Lancets) .... Use asd two times a day 8)  Lipitor 10 Mg Tabs (Atorvastatin calcium) .Marland Kitchen.. 1 by mouth once daily 9)  Plavix 75 Mg Tabs (Clopidogrel bisulfate) .... Take one tablet by mouth daily 10)  Losartan Potassium 100 Mg Tabs (Losartan potassium) .Marland Kitchen.. 1 by mouth once daily 11)  Fluticasone Propionate 50 Mcg/act Susp (Fluticasone propionate) .... 2 spray/side once daily 12)  Prednisone 10 Mg Tabs (Prednisone) .... 3po qd for 3days, then 2po qd for 3days, then 1po qd for 3days, then stop  Patient Instructions: 1)  you had the steroid shot today 2)  Please take all new medications as prescribed - the prednisone, and the nasal spray 3)  You should also take the Allegra 180 mg per day (OTC) as long as it does not cause sleepiness (it should not) 4)  please stop the OTC nasal decongestant 5)  you are also given the azor sample - 1 per day;  remember to NOT take the losartan AND the amlodipine while you take the samples 6)  Continue all previous medications as before this visit  7)  Please schedule a follow-up appointment as needed. Prescriptions: PREDNISONE 10 MG TABS (PREDNISONE) 3po qd for 3days, then 2po qd for 3days, then 1po qd for 3days, then stop  #18 x 0   Entered and Authorized by:   [name] MD   Signed by:   [name] MD on 02/16/2010   Method used:   Print then Give to Patient   RxID:    [id number]  FLUTICASONE PROPIONATE 50 MCG/ACT SUSP (FLUTICASONE PROPIONATE) 2 spray/side once daily  #1 x 11   Entered and Authorized by:   [name] MD   Signed by:   [name] MD on 02/16/2010   Method used:   Print then Give to Patient   RxID:   [id number]    Medication Administration  Injection # 1:    Medication: Depo- Medrol 40mg     Diagnosis: ALLERGIC RHINITIS (ICD-477.9)    Route: IM    Site: LUOQ gluteus    Exp Date: 09/2012    Lot #: [id number]    Mfr: [employer]    Given by: Zella Ball Ewing (February 16, 2010 11:07 AM)  Injection # 2:    Medication: Depo- Medrol 80mg     Diagnosis: ALLERGIC RHINITIS (ICD-477.9)    Route: IM    Site: LUOQ gluteus    Exp Date: 09/2012    Lot #: [id number]    Mfr: [employer]    Given by: Zella Ball Ewing (February 16, 2010 11:07 AM)  Orders Added: 1)  Depo- Medrol 40mg  [J1030] 2)  Depo- Medrol 80mg  [J1040] 3)  Admin of Therapeutic Inj  intramuscular or subcutaneous [96372] 4)  Est. Patient Level IV [32440]

## 2010-12-19 NOTE — Assessment & Plan Note (Signed)
Summary: 6 MOROV /NWS  #   RS'D PER PT/NWS   Vital Signs:  Patient profile:   77 year old male Height:      72 inches Weight:      172.25 pounds BMI:     23.45 O2 Sat:      97 % on Room air Temp:     97.5 degrees F oral Pulse rate:   61 / minute BP sitting:   140 / 74  (left arm) Cuff size:   regular  Vitals Entered By: Zella Ball Ewing CMA Duncan Dull) (June 26, 2010 8:13 AM)  O2 Flow:  Room air  CC: 6 month ROV/RE   Primary Care Provider:  [name] MD  CC:  6 month ROV/RE.  History of Present Illness: here to f/u; does mention chronic left side abd pain that seems worse to stand up, but with BM's as well.  Recnt CT april 2011 neg from apr 27. No bowel change.  no blood. No fever, wt loss, night sweats, loss of appetite or other constitutional symptoms .  Last colonsocpy 2009 - not due for f/u until 2014.  Has knoen RIH but asympt and deferring surgury for now.  Has known BPH but no prostatism symptoms such as urinary retention.  Even LBP chronic due due deg changes not active in the past few motnhs exccep when sitting too long and still and pain to get up and walk.  No LE pain, weak. numb or falls,  or injury.  Currently off the plavix - no overt bleeding, bruising. Taking ASA 81 mg - Pt denies CP, sob, doe, wheezing, orthopnea, pnd, worsening LE edema, palps, dizziness or syncope  Pt denies new neuro symptoms such as headache, facial or extremity weakness  Pt denies polydipsia, polyuria, or low sugar symptoms such as shakiness improved with eating.  Overall good compliance with meds, trying to follow low chol, DM diet, wt stable, little excercise however   CBG's most usually in the very loww 100's.    Preventive Screening-Counseling & Management      Drug Use:  no.    Problems Prior to Update: 1)  Inguinal Hernia, Right  (ICD-550.90) 2)  Benign Prostatic Hypertrophy  (ICD-600.00) 3)  Diarrhea of Presumed Infectious Origin  (ICD-009.3) 4)  Myocardial Perfusion Scan, With Stress  Test, Abnormal  (ICD-794.39) 5)  Hypertension  (ICD-401.9) 6)  Hyperlipidemia  (ICD-272.4) 7)  Gerd  (ICD-530.81) 8)  Diabetes Mellitus, Type II  (ICD-250.00) 9)  Anxiety  (ICD-300.00) 10)  Leg Pain, Left  (ICD-729.5) 11)  Cad, Native Vessel  (ICD-414.01) 12)  Back Pain  (ICD-724.5) 13)  Rash-nonvesicular  (ICD-782.1) 14)  Back Pain  (ICD-724.5) 15)  Preventive Health Care  (ICD-V70.0) 16)  Preoperative Examination  (ICD-V72.84) 17)  Inguinal Hernia, Right  (ICD-550.90) 18)  Frequency, Urinary  (ICD-788.41) 19)  Preventive Health Care  (ICD-V70.0) 20)  Special Screening Malignant Neoplasm of Prostate  (ICD-V76.44) 21)  Routine General Medical Exam@health  Care Facl  (ICD-V70.0) 22)  Esophageal Stricture  (ICD-530.3) 23)  Rash-nonvesicular  (ICD-782.1) 24)  Nephrolithiasis, Hx of  (ICD-V13.01) 25)  Diverticulosis, Colon  (ICD-562.10) 26)  Colonic Polyps, Hx of  (ICD-V12.72) 27)  Erectile Dysfunction  (ICD-302.72) 28)  Allergic Rhinitis  (ICD-477.9) 29)  Ibs  (ICD-564.1) 30)  Hemorrhoids, Hx of  (ICD-V12.79) 31)  Low Back Pain  (ICD-724.2)  Medications Prior to Update: 1)  Metformin Hcl 500 Mg Tb24 (Metformin Hcl) .... 2 By Mouth Once Daily 2)  Viagra 100 Mg Tabs (Sildenafil Citrate) .... As Directed 3)  Amlodipine Besylate 10 Mg Tabs (Amlodipine Besylate) .Marland Kitchen.. 1 By Mouth Once Daily 4)  Lansoprazole 30 Mg Cpdr (Lansoprazole) .Marland Kitchen.. 1po Once Daily 5)  Aspirin 81 Mg Tbec (Aspirin) .... Take One Tablet By Mouth Daily 6)  Onetouch Ultra Test   Strp (Glucose Blood) .... Use Asd Bid 7)  Onetouch Lancets   Misc (Lancets) .... Use Asd Two Times A Day 8)  Lipitor 10 Mg Tabs (Atorvastatin Calcium) .Marland Kitchen.. 1 By Mouth Once Daily 9)  Plavix 75 Mg Tabs (Clopidogrel Bisulfate) .... Take One Tablet By Mouth Daily 10)  Losartan Potassium 100 Mg Tabs (Losartan Potassium) .Marland Kitchen.. 1 By Mouth Once Daily 11)  Fluticasone Propionate 50 Mcg/act Susp (Fluticasone Propionate) .... 2 Spray/side Once Daily 12)   Lomotil 2.5-0.025 Mg Tabs (Diphenoxylate-Atropine) .Marland Kitchen.. 1 By Mouth As Needed Loose Stool - Max 8 Tabs Per 24 Hrs  Current Medications (verified): 1)  Metformin Hcl 500 Mg Tb24 (Metformin Hcl) .... 2 By Mouth Once Daily 2)  Viagra 100 Mg Tabs (Sildenafil Citrate) .... As Directed 3)  Amlodipine Besylate 10 Mg Tabs (Amlodipine Besylate) .Marland Kitchen.. 1 By Mouth Once Daily 4)  Lansoprazole 30 Mg Cpdr (Lansoprazole) .Marland Kitchen.. 1po Once Daily 5)  Aspirin 81 Mg Tbec (Aspirin) .... Take One Tablet By Mouth Daily 6)  Onetouch Ultra Test   Strp (Glucose Blood) .... Use Asd Bid 7)  Onetouch Lancets   Misc (Lancets) .... Use Asd Two Times A Day 8)  Lipitor 10 Mg Tabs (Atorvastatin Calcium) .Marland Kitchen.. 1 By Mouth Once Daily 9)  Losartan Potassium 100 Mg Tabs (Losartan Potassium) .Marland Kitchen.. 1 By Mouth Once Daily 10)  Fluticasone Propionate 50 Mcg/act Susp (Fluticasone Propionate) .... 2 Spray/side Once Daily 11)  Lomotil 2.5-0.025 Mg Tabs (Diphenoxylate-Atropine) .Marland Kitchen.. 1 By Mouth As Needed Loose Stool - Max 8 Tabs Per 24 Hrs  Allergies (verified): 1)  ! Mevacor 2)  ! 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[employer] Drug use-no  Risk Factors: Smoking Status: quit (10/28/2007)  Past Medical History: Current Problems:  MYOCARDIAL PERFUSION SCAN, WITH STRESS TEST, ABNORMAL (ICD-794.39) HYPERTENSION (ICD-401.9) HYPERLIPIDEMIA (ICD-272.4) GERD (ICD-530.81) DIABETES MELLITUS, TYPE II (ICD-250.00) ANXIETY (ICD-300.00) LEG PAIN, LEFT (ICD-729.5) CAD, NATIVE VESSEL (ICD-414.01) BACK PAIN (ICD-724.5) RASH-NONVESICULAR (ICD-782.1) BACK PAIN (ICD-724.5) PREVENTIVE HEALTH CARE (ICD-V70.0) PREOPERATIVE EXAMINATION (ICD-V72.84) INGUINAL HERNIA, RIGHT (ICD-550.90) FREQUENCY, URINARY (ICD-788.41) PREVENTIVE HEALTH CARE (ICD-V70.0) SPECIAL SCREENING  MALIGNANT NEOPLASM OF PROSTATE (ICD-V76.44) ROUTINE GENERAL MEDICAL EXAM@HEALTH  CARE FACL (ICD-V70.0) ESOPHAGEAL STRICTURE (ICD-530.3) RASH-NONVESICULAR (ICD-782.1) NEPHROLITHIASIS, HX OF (ICD-V13.01) DIVERTICULOSIS, COLON (ICD-562.10) COLONIC POLYPS, HX OF (ICD-V12.72) ERECTILE DYSFUNCTION (ICD-302.72) ALLERGIC RHINITIS (ICD-477.9) IBS (ICD-564.1) HEMORRHOIDS, HX OF (ICD-V12.79) LOW BACK PAIN (ICD-724.2) chronic left abd pain - c/w ? MSK 2011 Benign prostatic hypertrophy right inguinal hernia - known from 2009 - deferring surgury   MD Roster:   Dr [name] - card                       optho -  Dr on [facility]                      Derm -  Dr Lonni Fix  Gen Surgury - [employer]  Social History: Reviewed history from 03/14/2010 and no changes required. 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   Head:  normocephalic and atraumatic.   Eyes:  vision grossly intact, pupils equal, and pupils round.   Ears:  R ear normal and L ear normal.   Nose:  no external deformity and no nasal discharge.   Mouth:  no gingival abnormalities and pharynx pink and moist.   Neck:  supple and no masses.   Lungs:  normal respiratory effort and normal breath sounds.   Heart:  normal rate and regular rhythm.   Abdomen:  soft, non-tender, and normal bowel sounds.   Msk:  no spine tender, or paravertebral tender, swelling or erythema Extremities:  no edema, no erythema  Neurologic:  cranial nerves II-XII intact and strength normal in all extremities.     Impression & Recommendations:  Problem # 1:  HYPERTENSION (ICD-401.9)  His updated medication list for this problem includes:    Amlodipine Besylate 10 Mg Tabs (Amlodipine besylate) .Marland Kitchen... 1 by mouth once daily    Losartan Potassium 100  Mg Tabs (Losartan potassium) .Marland Kitchen... 1 by mouth once daily  BP today: 140/74 Prior BP: 132/70 (04/19/2010)  Labs Reviewed: K+: 4.4 (12/21/2009) Creat: : 1.0 (12/21/2009)   Chol: 153 (12/21/2009)   HDL: 39.90 (12/21/2009)   LDL: 89 (12/21/2009)   TG: 120.0 (12/21/2009) stable overall by hx and exam, ok to continue meds/tx as is   Problem # 2:  HYPERLIPIDEMIA (ICD-272.4)  His updated medication list for this problem includes:    Lipitor 10 Mg Tabs (Atorvastatin calcium) .Marland Kitchen... 1 by mouth once daily  Labs Reviewed: SGOT: 25 (12/21/2009)   SGPT: 29 (12/21/2009)   HDL:39.90 (12/21/2009), 29.1 (12/20/2008)  LDL:89 (12/21/2009), 48 (12/20/2008)  Chol:153 (12/21/2009), 116 (12/20/2008)  Trig:120.0 (12/21/2009), 197 (12/20/2008) stable overall by hx and exam, ok to continue meds/tx as is   Problem # 3:  DIABETES MELLITUS, TYPE II (ICD-250.00)  His updated medication list for this problem includes:    Metformin Hcl 500 Mg Tb24 (Metformin hcl) .Marland Kitchen... 2 by mouth once daily    Aspirin 81 Mg Tbec (Aspirin) .Marland Kitchen... Take one tablet by mouth daily    Losartan Potassium 100 Mg Tabs (Losartan potassium) .Marland Kitchen... 1 by mouth once daily  Orders: TLB-BMP (Basic Metabolic Panel-BMET) (80048-METABOL) TLB-Lipid Panel (80061-LIPID) TLB-A1C / Hgb A1C (Glycohemoglobin) (83036-A1C)  Labs Reviewed: Creat: 1.0 (12/21/2009)    Reviewed HgBA1c results: 6.6 (12/21/2009)  6.9 (12/20/2008) stable overall by hx and exam, ok to continue meds/tx as is   Complete Medication List: 1)  Metformin Hcl 500 Mg Tb24 (Metformin hcl) .... 2 by mouth once daily 2)  Viagra 100 Mg Tabs (Sildenafil citrate) .... As directed 3)  Amlodipine Besylate 10 Mg Tabs (Amlodipine besylate) .Marland Kitchen.. 1 by mouth once daily 4)  Lansoprazole 30 Mg Cpdr (Lansoprazole) .Marland Kitchen.. 1po once daily 5)  Aspirin 81 Mg Tbec (Aspirin) .... Take one tablet by mouth daily 6)  Onetouch Ultra Test Strp (Glucose blood) .... Use asd bid 7)  Onetouch Lancets Misc (Lancets)  .... Use asd two times a day 8)  Lipitor 10 Mg Tabs (Atorvastatin calcium) .Marland Kitchen.. 1 by mouth once daily 9)  Losartan Potassium 100 Mg Tabs (Losartan potassium) .Marland KitchenMarland KitchenMarland Kitchen  1 by mouth once daily 10)  Fluticasone Propionate 50 Mcg/act Susp (Fluticasone propionate) .... 2 spray/side once daily 11)  Lomotil 2.5-0.025 Mg Tabs (Diphenoxylate-atropine) .Marland Kitchen.. 1 by mouth as needed loose stool - max 8 tabs per 24 hrs  Patient Instructions: 1)  Continue all previous medications as before this visit  2)  Please go to the Lab in the basement for your blood and/or urine tests today 3)  Please schedule a follow-up appointment in 6 months for your "yearly medicare exam'

## 2010-12-19 NOTE — Letter (Signed)
Summary: Generic Letter  Architectural technologist, Main Office  [address]   [place], [place] [address]   Phone: [phone]  Fax: [phone]        March 15, 2010 MRN: [id number]    Keith Herrera 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  If you have any further questions please call the office at [phone].  Sincerely,    Veverly Fells. [name], MD

## 2010-12-19 NOTE — Assessment & Plan Note (Signed)
Summary: f14m      Allergies Added:   Visit Type:  Follow-up Primary Provider:  [name] MD  CC:  Follow up on stress test.  6 month follow up.  Pt has no cardiac concerns at this time.  Does have questions about Plavix.  History of Present Illness: Keith Herrera is a 77 year old gentleman with coronary artery disease who initially presented with an abnormal Myoview stress scan in the absence of chest pain. He was found to have severe proximal LAD stenosis at cardiac catheterization. He underwent two-vessel PCI involving the proximal LAD and first diagonal branches. He was treated with drug-eluting stents - the procedure was performed in January 2010.  The patient is been doing well from a cardiac perspective. He denies chest pain, dyspnea, edema, palpitations, or syncope. He has had problems with left flank pain and was evaluated at [facility] yesterday.  He reportedly underwent a CT scan which was normal by his report.  The patient has a right inguinal hernia that has been stable over the past year. He has minimal pain. He may move forward with surgery in the near future.      Current Medications (verified): 1)  Metformin Hcl 500 Mg Tb24 (Metformin Hcl) .... 2 By Mouth Once Daily 2)  Viagra 100 Mg Tabs (Sildenafil Citrate) .... As Directed 3)  Amlodipine Besylate 10 Mg Tabs (Amlodipine Besylate) .Marland Kitchen.. 1 By Mouth Once Daily 4)  Lansoprazole 30 Mg Cpdr (Lansoprazole) .Marland Kitchen.. 1po Once Daily 5)  Aspirin 81 Mg Tbec (Aspirin) .... Take One Tablet By Mouth Daily 6)  Onetouch Ultra Test   Strp (Glucose Blood) .... Use Asd Bid 7)  Onetouch Lancets   Misc (Lancets) .... Use Asd Two Times A Day 8)  Lipitor 10 Mg Tabs (Atorvastatin Calcium) .Marland Kitchen.. 1 By Mouth Once Daily 9)  Plavix 75 Mg Tabs (Clopidogrel Bisulfate) .... Take One Tablet By Mouth Daily 10)  Losartan Potassium 100 Mg Tabs (Losartan Potassium) .Marland Kitchen.. 1 By Mouth Once Daily 11)  Fluticasone Propionate 50 Mcg/act Susp (Fluticasone  Propionate) .... 2 Spray/side Once Daily  Allergies (verified): 1)  ! Mevacor 2)  ! Crestor  Past History:  Past medical history reviewed for relevance to current acute and chronic problems.  Past Medical History: Reviewed history from 03/14/2010 and no changes required. Current Problems:  MYOCARDIAL PERFUSION SCAN, WITH STRESS TEST, ABNORMAL (ICD-794.39) HYPERTENSION (ICD-401.9) HYPERLIPIDEMIA (ICD-272.4) GERD (ICD-530.81) DIABETES MELLITUS, TYPE II (ICD-250.00) ANXIETY (ICD-300.00) LEG PAIN, LEFT (ICD-729.5) CAD, NATIVE VESSEL (ICD-414.01) BACK PAIN (ICD-724.5) RASH-NONVESICULAR (ICD-782.1) BACK PAIN (ICD-724.5) PREVENTIVE HEALTH CARE (ICD-V70.0) PREOPERATIVE EXAMINATION (ICD-V72.84) INGUINAL HERNIA, RIGHT (ICD-550.90) FREQUENCY, URINARY (ICD-788.41) PREVENTIVE HEALTH CARE (ICD-V70.0) SPECIAL SCREENING MALIGNANT NEOPLASM OF PROSTATE (ICD-V76.44) ROUTINE GENERAL MEDICAL EXAM@HEALTH  CARE FACL (ICD-V70.0) ESOPHAGEAL STRICTURE (ICD-530.3) RASH-NONVESICULAR (ICD-782.1) NEPHROLITHIASIS, HX OF (ICD-V13.01) DIVERTICULOSIS, COLON (ICD-562.10) COLONIC POLYPS, HX OF (ICD-V12.72) ERECTILE DYSFUNCTION (ICD-302.72) ALLERGIC RHINITIS (ICD-477.9) IBS (ICD-564.1) HEMORRHOIDS, HX OF (ICD-V12.79) LOW BACK PAIN (ICD-724.2)  Review of Systems       Negative except as per HPI   Vital Signs:  Patient profile:   77 year old male Height:      72 inches Weight:      165 pounds BMI:     22.46 Pulse rate:   76 / minute Pulse rhythm:   regular Resp:     16 per minute BP sitting:   142 / 74  (left arm) Cuff size:   regular  Vitals Entered By: Judithe Modest CMA (March 15, 2010 10:09 AM)  Physical Exam  General:  Pt is alert and oriented, in no acute distress. HEENT: normal Neck: normal carotid upstrokes without bruits, JVP normal Lungs: CTA CV: RRR without murmur or gallop Abd: soft, NT, positive BS, no bruit, no organomegaly Ext: no clubbing, cyanosis, or edema. peripheral  pulses 2+ and equal Skin: warm and dry without rash     Nuclear ETT  Procedure date:  12/25/2009  Findings:      Exercise Capacity: Fair exercise capacity. BP Response: Normal blood pressure response. Clinical Symptoms: No chest pain ECG Impression: Significant ST abnormalities consistent with ischemia. Overall Impression: Low risk stress nuclear study with inferoseptal thinning; no ischemia.   EKG  Procedure date:  03/15/2010  Findings:      Normal sinus rhythm, heart rate 76 beats per minute, within normal limits  Impression & Recommendations:  Problem # 1:  CAD, NATIVE VESSEL (ICD-414.01) The patient is stable without angina. Myoview stress test results were reviewed and showed no ischemia. Recommend continue current medical therapy. We had a long discussion regarding the risks and benefits of continued dual antiplatelet therapy with Plavix. The patient's drug eluding stents were implanted greater than one year ago and it would be acceptable for him to discontinue Plavix at this point. We discussed the finite risk of late stent thrombosis weight against increased bleeding risk on both antiplatelet medications. He wishes to continue Plavix at present, but could hold this medication perioperatively if he requires inguinal hernia surgery.  His updated medication list for this problem includes:    Amlodipine Besylate 10 Mg Tabs (Amlodipine besylate) .Marland Kitchen... 1 by mouth once daily    Aspirin 81 Mg Tbec (Aspirin) .Marland Kitchen... Take one tablet by mouth daily    Plavix 75 Mg Tabs (Clopidogrel bisulfate) .Marland Kitchen... Take one tablet by mouth daily  Orders: EKG w/ Interpretation (93000)  Problem # 2:  HYPERTENSION (ICD-401.9) Blood pressure very mildly increased today. We'll continue to monitor on an ambulatory basis and increase antihypertensive medications if systolic readings remain above 140  His updated medication list for this problem includes:    Amlodipine Besylate 10 Mg Tabs (Amlodipine  besylate) .Marland Kitchen... 1 by mouth once daily    Aspirin 81 Mg Tbec (Aspirin) .Marland Kitchen... Take one tablet by mouth daily    Losartan Potassium 100 Mg Tabs (Losartan potassium) .Marland Kitchen... 1 by mouth once daily  BP today: 142/74 Prior BP: 136/70 (02/16/2010)  Labs Reviewed: K+: 4.4 (12/21/2009) Creat: : 1.0 (12/21/2009)   Chol: 153 (12/21/2009)   HDL: 39.90 (12/21/2009)   LDL: 89 (12/21/2009)   TG: 120.0 (12/21/2009)  Problem # 3:  HYPERLIPIDEMIA (ICD-272.4) LDL cholesterol is less than 100. He did not tolerate Crestor secondary to myalgias. Lipids followed by [name]. Continue low dose Lipitor His updated medication list for this problem includes:    Lipitor 10 Mg Tabs (Atorvastatin calcium) .Marland Kitchen... 1 by mouth once daily  CHOL: 153 (12/21/2009)   LDL: 89 (12/21/2009)   HDL: 39.90 (12/21/2009)   TG: 120.0 (12/21/2009)  Patient Instructions: 1)  Your physician recommends that you schedule a follow-up appointment in: 12 months. The office will mail you a reminder letter  2 months prior appointment date 2)  Your physician recommends that you continue on your current medications as directed. Please refer to the Current Medication list given to you today.

## 2010-12-19 NOTE — Progress Notes (Signed)
Summary: LEFT SIDE PAIN GOING TO CHEST  Phone Note Call from Patient   Caller: Patient Summary of Call: PT CALLED FOR AN APPT.  HE WAS HAVING SEVERE PAIN IN LEFT SIDE GOING UP TO CHEST CAUSING HIM TO BREAK OUT IN SWEATS DURING THE NIGHT.  HE WAS TOLD TO GO TO THE ER PER ROBIN.  PT SAID HE WILL GO. Initial call taken by: Hilarie Fredrickson,  March 14, 2010 8:45 AM  Follow-up for Phone Call        i agree Follow-up by: [name] MD,  March 14, 2010 11:57 AM

## 2010-12-19 NOTE — Progress Notes (Signed)
Summary: refill  Medications Added PLAVIX 75 MG TABS (CLOPIDOGREL BISULFATE) Take one tablet by mouth daily       Phone Note Refill Request Message from:  Pharmacy/Jennifer on November 21, 2009 3:28 PM  Refills Requested: Medication #1:  plavix 75 mg 1 tab daily   [facility]ly Requested[facility] [place] [id number]   Method Requested: Fax to Local Pharmacy Initial call taken by: Migdalia Dk,  November 21, 2009 3:29 PM  Follow-up for Phone Call        According  to med list, pt. is not on plavix.  Message left pt. to call me back Follow-up by: Vikki Ports,  November 21, 2009 4:14 PM  Additional Follow-up for Phone Call Additional follow up Details #1::        Patient phoned.Marland KitchenMarland KitchenAccording to patient he does take Plavix 75 mg please call patient [phone] Additional Follow-up by: Burnard Leigh,  November 22, 2009 8:34 AM    Additional Follow-up for Phone Call Additional follow up Details #2::    I spoke with the pt's wife and she said the pt had not stopped Plavix.  [facility] Follow-up by: Julieta Gutting, RN, BSN,  November 22, 2009 1:28 PM  New/Updated Medications: PLAVIX 75 MG TABS (CLOPIDOGREL BISULFATE) Take one tablet by mouth daily Prescriptions: PLAVIX 75 MG TABS (CLOPIDOGREL BISULFATE) Take one tablet by mouth daily  #30 x 11   Entered by:   Julieta Gutting, RN, BSN   Authorized by:   Norva Karvonen, MD   Signed by:   Julieta Gutting, RN, BSN on 11/22/2009   Method used:   [facility]        [facility] [phone][facility] (retail)       [address]       [place], [place]  [address]       Ph: [phone] or [phone]       Fax: [phone]   RxID:   [id number]

## 2010-12-19 NOTE — Progress Notes (Signed)
Summary: refill   Phone Note Refill Request   Refills Requested: Medication #1:  PLAVIX 75 MG TABS Take one tablet by mouth daily.   [facility] [place]   Method Requested: Fax to Local Pharmacy Initial call taken by: Migdalia Dk,  December 19, 2009 1:30 PM  Follow-up for Phone Call        Rx faxed to pharmacy Follow-up by: Vikki Ports,  December 19, 2009 4:54 PM    Prescriptions: PLAVIX 75 MG TABS (CLOPIDOGREL BISULFATE) Take one tablet by mouth daily  #7 x 0   Entered by:   Vikki Ports   Authorized by:   Norva Karvonen, MD   Signed by:   Vikki Ports on 12/19/2009   Method used:   [facility] ...       [facility]  [address] [phone][facility] ([facility]ail[facility]       [address]       [place], [place]  [address]       Ph: [phone] or [phone]       Fax: [phone]   RxID:   [id number]

## 2010-12-19 NOTE — Miscellaneous (Signed)
Summary: Orders Update--Myoview  Clinical Lists Changes  Orders: Added new Referral order of Nuclear Stress Test (Nuc Stress Test) - Signed

## 2010-12-20 NOTE — Assessment & Plan Note (Signed)
Summary: [facility]/CD   Vital Signs:  Patient profile:   77 year old male Height:      72 inches Weight:      177.25 pounds BMI:     24.13 O2 Sat:      97 % on Room air Temp:     97.8 degrees F oral Pulse rate:   69 / minute BP sitting:   144 / 70  (left arm) Cuff size:   regular  Vitals Entered By: Zella Ball Ewing CMA Duncan Dull) (December 14, 2010 1:33 PM)  O2 Flow:  Room air [facility]: [facility]   Primary Care Provider:  [name] MD  [facility].  History of Present Illness: here to f/u afer attending a fmily wedding near [place], grabbed sopme cheese and food from the table and went into the church, while sitting in the church developed mid ant CP, taken by EMS to the ER with ECG, blood work and Ct chest ;  given ham sandwich in the ER without diffictuly and released to go home; still having "gas" after the contrast for the CT, and requesting eval further for GB;  has appt on 14th here but came early due to his symptoms and ER MD recommendation;  Pt denies futher CP, worsening sob, doe, wheezing, orthopnea, pnd, worsening LE edema, palps, dizziness or syncope Pt denies new neuro symptoms such as headache, facial or extremity weakness  Pt denies polydipsia, polyuria,  Overall good compliance with meds, trying to follow low chol, DM diet, wt stable, little excercise however .  Pt also remembers blood sugar 123 per EMS when he was first checked out; since then cbg's have been in the very low 100's.   Feb 2o11 stress neg.  Does not have reflux or esoph spasm or dysphagia in the past.  Did have EGD in the past with Sam Sterling - has hiatal hernia, and cont's with PPI.  No fever, wt loss, night sweats, loss of appetite or other constitutional symptoms   Problems Prior to Update: 1)  Abdominal Pain, Upper  (ICD-789.09) 2)  Chest Pain  (ICD-786.50) 3)  Inguinal Hernia, Right  (ICD-550.90) 4)  Benign Prostatic Hypertrophy  (ICD-600.00) 5)  Diarrhea of Presumed Infectious  Origin  (ICD-009.3) 6)  Myocardial Perfusion Scan, With Stress Test, Abnormal  (ICD-794.39) 7)  Hypertension  (ICD-401.9) 8)  Hyperlipidemia  (ICD-272.4) 9)  Gerd  (ICD-530.81) 10)  Diabetes Mellitus, Type II  (ICD-250.00) 11)  Anxiety  (ICD-300.00) 12)  Leg Pain, Left  (ICD-729.5) 13)  Cad, Native Vessel  (ICD-414.01) 14)  Back Pain  (ICD-724.5) 15)  Rash-nonvesicular  (ICD-782.1) 16)  Back Pain  (ICD-724.5) 17)  Preventive Health Care  (ICD-V70.0) 18)  Preoperative Examination  (ICD-V72.84) 19)  Inguinal Hernia, Right  (ICD-550.90) 20)  Frequency, Urinary  (ICD-788.41) 21)  Preventive Health Care  (ICD-V70.0) 22)  Special Screening Malignant Neoplasm of Prostate  (ICD-V76.44) 23)  Routine General Medical Exam@health  Care Facl  (ICD-V70.0) 24)  Esophageal Stricture  (ICD-530.3) 25)  Rash-nonvesicular  (ICD-782.1) 26)  Nephrolithiasis, Hx of  (ICD-V13.01) 27)  Diverticulosis, Colon  (ICD-562.10) 28)  Colonic Polyps, Hx of  (ICD-V12.72) 29)  Erectile Dysfunction  (ICD-302.72) 30)  Allergic Rhinitis  (ICD-477.9) 31)  Ibs  (ICD-564.1) 32)  Hemorrhoids, Hx of  (ICD-V12.79) 33)  Low Back Pain  (ICD-724.2)  Medications Prior to Update: 1)  Metformin Hcl 500 Mg Tb24 (Metformin Hcl) .... 2 By Mouth Once Daily 2)  Viagra 100 Mg  Tabs (Sildenafil Citrate) .... As Directed 3)  Amlodipine Besylate 10 Mg Tabs (Amlodipine Besylate) .Marland Kitchen.. 1 By Mouth Once Daily 4)  Lansoprazole 30 Mg Cpdr (Lansoprazole) .Marland Kitchen.. 1po Once Daily 5)  Aspirin 81 Mg Tbec (Aspirin) .... Take One Tablet By Mouth Daily 6)  Onetouch Ultra Test   Strp (Glucose Blood) .... Use Asd Bid 7)  Onetouch Lancets   Misc (Lancets) .... Use Asd Two Times A Day 8)  Lipitor 10 Mg Tabs (Atorvastatin Calcium) .Marland Kitchen.. 1 By Mouth Once Daily 9)  Losartan Potassium 100 Mg Tabs (Losartan Potassium) .Marland Kitchen.. 1 By Mouth Once Daily 10)  Fluticasone Propionate 50 Mcg/act Susp (Fluticasone Propionate) .... 2 Spray/side Once Daily 11)  Lomotil 2.5-0.025 Mg  Tabs (Diphenoxylate-Atropine) .Marland Kitchen.. 1 By Mouth As Needed Loose Stool - Max 8 Tabs Per 24 Hrs  Current Medications (verified): 1)  Metformin Hcl 500 Mg Tb24 (Metformin Hcl) .... 2 By Mouth Once Daily 2)  Viagra 100 Mg Tabs (Sildenafil Citrate) .... As Directed 3)  Amlodipine Besylate 10 Mg Tabs (Amlodipine Besylate) .Marland Kitchen.. 1 By Mouth Once Daily 4)  Lansoprazole 30 Mg Cpdr (Lansoprazole) .Marland Kitchen.. 1po Once Daily 5)  Aspirin 81 Mg Tbec (Aspirin) .... Take One Tablet By Mouth Daily 6)  Onetouch Ultra Test   Strp (Glucose Blood) .... Use Asd Bid 7)  Onetouch Lancets   Misc (Lancets) .... Use Asd Two Times A Day 8)  Lipitor 10 Mg Tabs (Atorvastatin Calcium) .Marland Kitchen.. 1 By Mouth Once Daily 9)  Losartan Potassium 100 Mg Tabs (Losartan Potassium) .Marland Kitchen.. 1 By Mouth Once Daily 10)  Fluticasone Propionate 50 Mcg/act Susp (Fluticasone Propionate) .... 2 Spray/side Once Daily 11)  Lomotil 2.5-0.025 Mg Tabs (Diphenoxylate-Atropine) .Marland Kitchen.. 1 By Mouth As Needed Loose Stool - Max 8 Tabs Per 24 Hrs 12)  Triamcinolone Acetonide 0.1 % Crea (Triamcinolone Acetonide) .... Use Asd Two Times A Day As Needed  Allergies (verified): 1)  ! Mevacor 2)  ! Crestor  Past History:  Past Medical History: Last updated: 06/26/2010 Current Problems:  MYOCARDIAL PERFUSION SCAN, WITH STRESS TEST, ABNORMAL (ICD-794.39) HYPERTENSION (ICD-401.9) HYPERLIPIDEMIA (ICD-272.4) GERD (ICD-530.81) DIABETES MELLITUS, TYPE II (ICD-250.00) ANXIETY (ICD-300.00) LEG PAIN, LEFT (ICD-729.5) CAD, NATIVE VESSEL (ICD-414.01) BACK PAIN (ICD-724.5) RASH-NONVESICULAR (ICD-782.1) BACK PAIN (ICD-724.5) PREVENTIVE HEALTH CARE (ICD-V70.0) PREOPERATIVE EXAMINATION (ICD-V72.84) INGUINAL HERNIA, RIGHT (ICD-550.90) FREQUENCY, URINARY (ICD-788.41) PREVENTIVE HEALTH CARE (ICD-V70.0) SPECIAL SCREENING MALIGNANT NEOPLASM OF PROSTATE (ICD-V76.44) ROUTINE GENERAL MEDICAL EXAM@HEALTH  CARE FACL (ICD-V70.0) ESOPHAGEAL STRICTURE (ICD-530.3) RASH-NONVESICULAR  (ICD-782.1) NEPHROLITHIASIS, HX OF (ICD-V13.01) DIVERTICULOSIS, COLON (ICD-562.10) COLONIC POLYPS, HX OF (ICD-V12.72) ERECTILE DYSFUNCTION (ICD-302.72) ALLERGIC RHINITIS (ICD-477.9) IBS (ICD-564.1) HEMORRHOIDS, HX OF (ICD-V12.79) LOW BACK PAIN (ICD-724.2) chronic left abd pain - c/w ? [facility] 2011 Benign prostatic hypertrophy right inguinal hernia - known from 2009 - deferring surgury   MD Roster:   Dr [name] - card                       optho -  Dr on [address]                      Derm -  Dr Pat Patrick Surgury - [facility] [place]  Past Surgical History: Last updated: 03/14/2010 s/p coronary stent x 19 Oct 2008  Social History: Last updated: 06/26/2010 Former Smoker Alcohol use-no Married 3 children  drives bus for [employer]  Drug use-no  Risk Factors: Smoking Status: quit (10/28/2007)  Review of Systems       all otherwise negative per pt -    Physical Exam  General:  alert and well-developed.   Head:  normocephalic and atraumatic.   Eyes:  vision grossly intact, pupils equal, and pupils round.   Ears:  R ear normal and L ear normal.   Nose:  no external deformity and no nasal discharge.   Mouth:  no gingival abnormalities and pharynx pink and moist.   Neck:  supple and no masses.   Lungs:  normal respiratory effort and normal breath sounds.   Heart:  normal rate and regular rhythm.   Abdomen:  soft, non-tender, and normal bowel sounds.   Msk:  no spine tender, or paravertebral tender, swelling or erythema Extremities:  no edema, no erythema    Impression & Recommendations:  Problem # 1:  CHEST PAIN (ICD-786.50) ok for GB u/s, but suspect esoph spasm by hx - Continue all previous medications as before this visit   Problem # 2:  HYPERTENSION (ICD-401.9)  His updated medication list for this problem includes:    Amlodipine Besylate 10 Mg Tabs (Amlodipine besylate) .Marland Kitchen... 1 by mouth once daily    Losartan Potassium 100 Mg Tabs  (Losartan potassium) .Marland Kitchen... 1 by mouth once daily  BP today: 144/70 Prior BP: 140/74 (06/26/2010)  Labs Reviewed: K+: 4.8 (06/26/2010) Creat: : 1.0 (06/26/2010)   Chol: 158 (06/26/2010)   HDL: 34.40 (06/26/2010)   LDL: 88 (06/26/2010)   TG: 176.0 (06/26/2010) stable overall by hx and exam, ok to continue meds/tx as is   Problem # 3:  DIABETES MELLITUS, TYPE II (ICD-250.00)  His updated medication list for this problem includes:    Metformin Hcl 500 Mg Tb24 (Metformin hcl) .Marland Kitchen... 2 by mouth once daily    Aspirin 81 Mg Tbec (Aspirin) .Marland Kitchen... Take one tablet by mouth daily    Losartan Potassium 100 Mg Tabs (Losartan potassium) .Marland Kitchen... 1 by mouth once daily  Labs Reviewed: Creat: 1.0 (06/26/2010)    Reviewed HgBA1c results: 6.2 (06/26/2010)  6.6 (12/21/2009) stable overall by hx and exam, ok to continue meds/tx as is  - Pt to cont DM diet, excercise, wt control efforts; to check labs next visit - declines labs today  Problem # 4:  HYPERLIPIDEMIA (ICD-272.4)  His updated medication list for this problem includes:    Lipitor 10 Mg Tabs (Atorvastatin calcium) .Marland Kitchen... 1 by mouth once daily  Labs Reviewed: SGOT: 25 (12/21/2009)   SGPT: 29 (12/21/2009)   HDL:34.40 (06/26/2010), 39.90 (12/21/2009)  LDL:88 (06/26/2010), 89 (12/21/2009)  Chol:158 (06/26/2010), 153 (12/21/2009)  Trig:176.0 (06/26/2010), 120.0 (12/21/2009) stable overall by hx and exam, ok to continue meds/tx as is , Pt to continue diet efforts, good med tolerance; to check labs - goal LDL less than 70   Complete Medication List: 1)  Metformin Hcl 500 Mg Tb24 (Metformin hcl) .... 2 by mouth once daily 2)  Viagra 100 Mg Tabs (Sildenafil citrate) .... As directed 3)  Amlodipine Besylate 10 Mg Tabs (Amlodipine besylate) .Marland Kitchen.. 1 by mouth once daily 4)  Lansoprazole 30 Mg Cpdr (Lansoprazole) .Marland Kitchen.. 1po once daily 5)  Aspirin 81 Mg Tbec (Aspirin) .... Take one tablet by mouth daily 6)  Onetouch Ultra Test Strp (Glucose blood) .... Use asd  bid 7)  Onetouch Lancets Misc (Lancets) .... Use asd two times a day 8)  Lipitor 10 Mg Tabs (Atorvastatin calcium) .Marland Kitchen.. 1 by mouth once daily 9)  Losartan Potassium 100 Mg Tabs (Losartan potassium) .Marland Kitchen.. 1 by mouth once daily 10)  Fluticasone Propionate 50 Mcg/act Susp (Fluticasone propionate) .... 2 spray/side once daily 11)  Lomotil 2.5-0.025 Mg Tabs (Diphenoxylate-atropine) .Marland Kitchen.. 1 by mouth as needed loose stool - max 8 tabs per 24 hrs 12)  Triamcinolone Acetonide 0.1 % Crea (Triamcinolone acetonide) .... Use asd two times a day as needed  Other Orders: Radiology Referral (Radiology)  Patient Instructions: 1)  You will be contacted about the referral(s) to: ultrasound for the gallbladder and abdomen 2)  Continue all previous medications as before this visit  3)  Please schedule a follow-up appointment as planned next month Prescriptions: TRIAMCINOLONE ACETONIDE 0.1 % CREA (TRIAMCINOLONE ACETONIDE) use asd two times a day as needed  #1large x 1   Entered and Authorized by:   [name] MD   Signed by:   [name] MD on 12/14/2010   Method used:   Electronically to        [facility] [phone]* (retail)       [address]       [place], [place]  [address]       Ph: [phone] or [phone]       Fax: [phone]   RxID:   [id number]    Orders Added: 1)  Radiology Referral [Radiology] 2)  Est. Patient Level IV [93818]

## 2010-12-31 ENCOUNTER — Encounter (INDEPENDENT_AMBULATORY_CARE_PROVIDER_SITE_OTHER): Payer: Self-pay | Admitting: *Deleted

## 2010-12-31 ENCOUNTER — Encounter: Payer: Self-pay | Admitting: Internal Medicine

## 2010-12-31 ENCOUNTER — Other Ambulatory Visit: Payer: Medicare HMO

## 2010-12-31 ENCOUNTER — Ambulatory Visit (INDEPENDENT_AMBULATORY_CARE_PROVIDER_SITE_OTHER): Payer: Medicare HMO | Admitting: Internal Medicine

## 2010-12-31 ENCOUNTER — Other Ambulatory Visit: Payer: Self-pay | Admitting: Internal Medicine

## 2010-12-31 DIAGNOSIS — E119 Type 2 diabetes mellitus without complications: Secondary | ICD-10-CM

## 2010-12-31 DIAGNOSIS — Z0000 Encounter for general adult medical examination without abnormal findings: Secondary | ICD-10-CM

## 2010-12-31 DIAGNOSIS — Z125 Encounter for screening for malignant neoplasm of prostate: Secondary | ICD-10-CM

## 2010-12-31 LAB — CBC WITH DIFFERENTIAL/PLATELET
Basophils Absolute: 0 10*3/uL (ref 0.0–0.1)
Eosinophils Absolute: 0.1 10*3/uL (ref 0.0–0.7)
Eosinophils Relative: 1.9 % (ref 0.0–5.0)
HCT: 49 % (ref 39.0–52.0)
Monocytes Absolute: 0.5 10*3/uL (ref 0.1–1.0)
Monocytes Relative: 7 % (ref 3.0–12.0)
Neutro Abs: 4.9 10*3/uL (ref 1.4–7.7)
Neutrophils Relative %: 67.9 % (ref 43.0–77.0)
WBC: 7.2 10*3/uL (ref 4.5–10.5)

## 2010-12-31 LAB — HEMOGLOBIN A1C: Hgb A1c MFr Bld: 7.1 % — ABNORMAL HIGH (ref 4.6–6.5)

## 2010-12-31 LAB — MICROALBUMIN / CREATININE URINE RATIO: Creatinine,U: 223.4 mg/dL

## 2010-12-31 LAB — URINALYSIS
Leukocytes, UA: NEGATIVE
Specific Gravity, Urine: >=1.03 (ref 1.000–1.030)
Total Protein, Urine: NEGATIVE

## 2010-12-31 LAB — BASIC METABOLIC PANEL
CO2: 26 mEq/L (ref 19–32)
Chloride: 101 mEq/L (ref 96–112)
GFR: 73.64 mL/min (ref >60.00)
Glucose, Bld: 133 mg/dL — ABNORMAL HIGH (ref 70–99)

## 2010-12-31 LAB — LIPID PANEL
Cholesterol: 149 mg/dL (ref 0–200)
Triglycerides: 186 mg/dL — ABNORMAL HIGH (ref 0.0–149.0)
VLDL: 37.2 mg/dL (ref 0.0–40.0)

## 2010-12-31 LAB — HEPATIC FUNCTION PANEL: Albumin: 3.9 g/dL (ref 3.5–5.2)

## 2010-12-31 LAB — TSH: TSH: 1.53 u[IU]/mL (ref 0.35–5.50)

## 2011-01-15 NOTE — Assessment & Plan Note (Signed)
Summary: YEARLY FU/MEDIARE/LABS SAME DAY/NWS #   Vital Signs:  Patient profile:   77 year old male Height:      72 inches Weight:      172.25 pounds BMI:     23.45 O2 Sat:      97 % on Room air Temp:     97.7 degrees F oral Pulse rate:   67 / minute BP sitting:   120 / 70  (left arm) Cuff size:   regular  Vitals Entered By: Zella Ball Ewing CMA Duncan Dull) (December 31, 2010 8:14 AM)  O2 Flow:  Room air  CC: Yearly/RE   Primary Care Provider:  [name] MD  CC:  Ulla Potash.  History of Present Illness: here for wellness and f/u -   c/o ongoing months of  burping and reflux despite good complaince with PPI;  no haorseness, cough, wt loss, abd pain, no f/c/n/v/d/c, dysphagisa or blood.     Had some soreness to the upper abd once last wk now improved.   No overt blood.   Last EGD yrs ago.    Pt denies CP, worsening sob, doe, wheezing, orthopnea, pnd, worsening LE edema, palps, dizziness or syncope  Pt denies new neuro symptoms such as headache, facial or extremity weakness  Pt denies polydipsia, polyuria  Overall good compliance with meds, trying to follow low chol, DM diet, wt stable, little excercise however .  No fever, wt loss, night sweats, loss of appetite or other constitutional symptoms  Overall good compliance with meds, and good tolerability.  Denies worsening depressive symptoms, suicidal ideation, or panic.   Pt states good ability with ADL's, low fall risk, home safety reviewed and adequate, no significant change in hearing or vision, trying to follow lower chol diet, and occasionally active only with regular excercise.  Has known right inguinal hernia, still asymptomatic -   Problems Prior to Update: 1)  Abdominal Pain, Upper  (ICD-789.09) 2)  Chest Pain  (ICD-786.50) 3)  Inguinal Hernia, Right  (ICD-550.90) 4)  Benign Prostatic Hypertrophy  (ICD-600.00) 5)  Diarrhea of Presumed Infectious Origin  (ICD-009.3) 6)  Myocardial Perfusion Scan, With Stress Test, Abnormal   (ICD-794.39) 7)  Hypertension  (ICD-401.9) 8)  Hyperlipidemia  (ICD-272.4) 9)  Gerd  (ICD-530.81) 10)  Diabetes Mellitus, Type II  (ICD-250.00) 11)  Anxiety  (ICD-300.00) 12)  Leg Pain, Left  (ICD-729.5) 13)  Cad, Native Vessel  (ICD-414.01) 14)  Back Pain  (ICD-724.5) 15)  Rash-nonvesicular  (ICD-782.1) 16)  Back Pain  (ICD-724.5) 17)  Preventive Health Care  (ICD-V70.0) 18)  Preoperative Examination  (ICD-V72.84) 19)  Inguinal Hernia, Right  (ICD-550.90) 20)  Frequency, Urinary  (ICD-788.41) 21)  Preventive Health Care  (ICD-V70.0) 22)  Special Screening Malignant Neoplasm of Prostate  (ICD-V76.44) 23)  Routine General Medical Exam@health  Care Facl  (ICD-V70.0) 24)  Esophageal Stricture  (ICD-530.3) 25)  Rash-nonvesicular  (ICD-782.1) 26)  Nephrolithiasis, Hx of  (ICD-V13.01) 27)  Diverticulosis, Colon  (ICD-562.10) 28)  Colonic Polyps, Hx of  (ICD-V12.72) 29)  Erectile Dysfunction  (ICD-302.72) 30)  Allergic Rhinitis  (ICD-477.9) 31)  Ibs  (ICD-564.1) 32)  Hemorrhoids, Hx of  (ICD-V12.79) 33)  Low Back Pain  (ICD-724.2)  Medications Prior to Update: 1)  Metformin Hcl 500 Mg Tb24 (Metformin Hcl) .... 2 By Mouth Once Daily 2)  Viagra 100 Mg Tabs (Sildenafil Citrate) .... As Directed 3)  Amlodipine Besylate 10 Mg Tabs (Amlodipine Besylate) .Marland Kitchen.. 1 By Mouth Once Daily 4)  Lansoprazole 30 Mg Cpdr (  Lansoprazole) .Marland Kitchen.. 1po Once Daily 5)  Aspirin 81 Mg Tbec (Aspirin) .... Take One Tablet By Mouth Daily 6)  Onetouch Ultra Test   Strp (Glucose Blood) .... Use Asd Bid 7)  Onetouch Lancets   Misc (Lancets) .... Use Asd Two Times A Day 8)  Lipitor 10 Mg Tabs (Atorvastatin Calcium) .Marland Kitchen.. 1 By Mouth Once Daily 9)  Losartan Potassium 100 Mg Tabs (Losartan Potassium) .Marland Kitchen.. 1 By Mouth Once Daily 10)  Fluticasone Propionate 50 Mcg/act Susp (Fluticasone Propionate) .... 2 Spray/side Once Daily 11)  Lomotil 2.5-0.025 Mg Tabs (Diphenoxylate-Atropine) .Marland Kitchen.. 1 By Mouth As Needed Loose Stool - Max 8  Tabs Per 24 Hrs 12)  Triamcinolone Acetonide 0.1 % Crea (Triamcinolone Acetonide) .... Use Asd Two Times A Day As Needed  Current Medications (verified): 1)  Metformin Hcl 500 Mg Tb24 (Metformin Hcl) .... 2 By Mouth Once Daily 2)  Viagra 100 Mg Tabs (Sildenafil Citrate) .... As Directed 3)  Amlodipine Besylate 10 Mg Tabs (Amlodipine Besylate) .Marland Kitchen.. 1 By Mouth Once Daily 4)  Lansoprazole 30 Mg Cpdr (Lansoprazole) .Marland Kitchen.. 1po Once Two Times A Day 5)  Aspirin 81 Mg Tbec (Aspirin) .... Take One Tablet By Mouth Daily 6)  Onetouch Ultra Test   Strp (Glucose Blood) .... Use Asd Bid 7)  Onetouch Lancets   Misc (Lancets) .... Use Asd Two Times A Day 8)  Lipitor 10 Mg Tabs (Atorvastatin Calcium) .Marland Kitchen.. 1 By Mouth Once Daily 9)  Losartan Potassium 100 Mg Tabs (Losartan Potassium) .Marland Kitchen.. 1 By Mouth Once Daily 10)  Fluticasone Propionate 50 Mcg/act Susp (Fluticasone Propionate) .... 2 Spray/side Once Daily 11)  Lomotil 2.5-0.025 Mg Tabs (Diphenoxylate-Atropine) .Marland Kitchen.. 1 By Mouth As Needed Loose Stool - Max 8 Tabs Per 24 Hrs 12)  Triamcinolone Acetonide 0.1 % Crea (Triamcinolone Acetonide) .... Use Asd Two Times A Day As Needed  Allergies (verified): 1)  ! Mevacor 2)  ! Crestor  Past History:  Past Medical History: Last updated: 06/26/2010 Current Problems:  MYOCARDIAL PERFUSION SCAN, WITH STRESS TEST, ABNORMAL (ICD-794.39) HYPERTENSION (ICD-401.9) HYPERLIPIDEMIA (ICD-272.4) GERD (ICD-530.81) DIABETES MELLITUS, TYPE II (ICD-250.00) ANXIETY (ICD-300.00) LEG PAIN, LEFT (ICD-729.5) CAD, NATIVE VESSEL (ICD-414.01) BACK PAIN (ICD-724.5) RASH-NONVESICULAR (ICD-782.1) BACK PAIN (ICD-724.5) PREVENTIVE HEALTH CARE (ICD-V70.0) PREOPERATIVE EXAMINATION (ICD-V72.84) INGUINAL HERNIA, RIGHT (ICD-550.90) FREQUENCY, URINARY (ICD-788.41) PREVENTIVE HEALTH CARE (ICD-V70.0) SPECIAL SCREENING MALIGNANT NEOPLASM OF PROSTATE (ICD-V76.44) ROUTINE GENERAL MEDICAL EXAM@HEALTH  CARE FACL (ICD-V70.0) ESOPHAGEAL STRICTURE  (ICD-530.3) RASH-NONVESICULAR (ICD-782.1) NEPHROLITHIASIS, HX OF (ICD-V13.01) DIVERTICULOSIS, COLON (ICD-562.10) COLONIC POLYPS, HX OF (ICD-V12.72) ERECTILE DYSFUNCTION (ICD-302.72) ALLERGIC RHINITIS (ICD-477.9) IBS (ICD-564.1) HEMORRHOIDS, HX OF (ICD-V12.79) LOW BACK PAIN (ICD-724.2) chronic left abd pain - c/w ? [facility] 2011 Benign prostatic hypertrophy right inguinal hernia - known from 2009 - deferring surgury   MD Roster:   Dr [name] - card                       optho -  Dr on [address]                      Derm -  Dr Pat Patrick Surgury - [facility]  Past Surgical History: Last updated: Apr 07, 2010 s/p coronary stent x 19 Oct 2008  Family History: Last updated: 04/07/2010 mother died at 37 with cancer father died at 39 natural causes brother died at 74 unknown cause sister  died at 53 with cancer  Social History: Last updated: 06/26/2010 Former Smoker Alcohol use-no Married 3 children  drives bus for [employer] Drug use-no  Risk Factors: Smoking Status: quit (10/28/2007)  Review of Systems  The patient denies anorexia, fever, vision loss, decreased hearing, hoarseness, chest pain, syncope, dyspnea on exertion, peripheral edema, prolonged cough, headaches, hemoptysis, melena, hematochezia, severe indigestion/heartburn, hematuria, muscle weakness, suspicious skin lesions, transient blindness, difficulty walking, depression, unusual weight change, abnormal bleeding, enlarged lymph nodes, and angioedema.         Marland Kitchenall otherwise negative per pt -    Physical Exam  General:  alert and well-developed.   Head:  normocephalic and atraumatic.   Eyes:  vision grossly intact, pupils equal, and pupils round.   Ears:  R ear normal and L ear normal.   Nose:  no external deformity and no nasal discharge.   Mouth:  no gingival abnormalities and pharynx pink and moist.   Neck:  supple and no masses.   Lungs:  normal respiratory effort and  normal breath sounds.   Heart:  normal rate and regular rhythm.   Abdomen:  soft, non-tender, and normal bowel sounds.   Genitalia:  Testes bilaterally descended without nodularity, tenderness or masses. No scrotal masses or lesions. No penis lesions or urethral discharge. No hernia noted today, nontender, nonswollen Msk:  no spine tender, or paravertebral tender, swelling or erythema Extremities:  no edema, no erythema  Neurologic:  cranial nerves II-XII intact and strength normal in all extremities.   Skin:  color normalno suspicious lesions.     Impression & Recommendations:  Problem # 1:  Preventive Health Care (ICD-V70.0) Overall doing well, age appropriate education and counseling updated, referral for preventive services and immunizations addressed, dietary counseling and smoking status adressed , most recent labs reviewed I have personally reviewed and have noted 1.The patient's medical and social history 2.Their use of alcohol, tobacco or illicit drugs 3.Their current medications and supplements 4. Functional ability including ADL's, fall risk, home safety risk, hearing & visual impairment  5.Diet and physical activities 6.Evidence for depression or mood disorders The patients weight, height, BMI  have been recorded in the chart I have made referrals, counseling and provided education to the patient based review of the above  Orders: TLB-BMP (Basic Metabolic Panel-BMET) (80048-METABOL) TLB-CBC Platelet - w/Differential (85025-CBCD) TLB-Hepatic/Liver Function Pnl (80076-HEPATIC) TLB-Lipid Panel (80061-LIPID) TLB-TSH (Thyroid Stimulating Hormone) (84443-TSH) TLB-PSA (Prostate Specific Antigen) (84153-PSA) TLB-Udip ONLY (81003-UDIP)  Problem # 2:  INGUINAL HERNIA, RIGHT (ICD-550.90) asympt in terms of pain, I suspect can hold on further eval and tx at this time  Problem # 3:  DIABETES MELLITUS, TYPE II (ICD-250.00)  His updated medication list for this problem includes:     Metformin Hcl 500 Mg Tb24 (Metformin hcl) .Marland Kitchen... 2 by mouth once daily    Aspirin 81 Mg Tbec (Aspirin) .Marland Kitchen... Take one tablet by mouth daily    Losartan Potassium 100 Mg Tabs (Losartan potassium) .Marland Kitchen... 1 by mouth once daily  Orders: TLB-A1C / Hgb A1C (Glycohemoglobin) (83036-A1C) TLB-Microalbumin/Creat Ratio, Urine (82043-MALB)  Labs Reviewed: Creat: 1.0 (06/26/2010)    Reviewed HgBA1c results: 6.2 (06/26/2010)  6.6 (12/21/2009) stable overall by hx and exam, ok to continue meds/tx as is ,Pt to cont DM diet, excercise, wt control efforts; to check labs today   Problem # 4:  GERD (ICD-530.81)  His updated medication list for this problem includes:    Lansoprazole 30 Mg Cpdr (Lansoprazole) .Marland Kitchen... 1po once two times  a day still symptomatic  - fo two times a day dosing, refer GI per pt request  Orders: Gastroenterology Referral (GI)  Labs Reviewed: Hgb: 15.6 (12/21/2009)   Hct: 48.8 (12/21/2009)  Complete Medication List: 1)  Metformin Hcl 500 Mg Tb24 (Metformin hcl) .... 2 by mouth once daily 2)  Viagra 100 Mg Tabs (Sildenafil citrate) .... As directed 3)  Amlodipine Besylate 10 Mg Tabs (Amlodipine besylate) .Marland Kitchen.. 1 by mouth once daily 4)  Lansoprazole 30 Mg Cpdr (Lansoprazole) .Marland Kitchen.. 1po once two times a day 5)  Aspirin 81 Mg Tbec (Aspirin) .... Take one tablet by mouth daily 6)  Onetouch Ultra Test Strp (Glucose blood) .... Use asd bid 7)  Onetouch Lancets Misc (Lancets) .... Use asd two times a day 8)  Lipitor 10 Mg Tabs (Atorvastatin calcium) .Marland Kitchen.. 1 by mouth once daily 9)  Losartan Potassium 100 Mg Tabs (Losartan potassium) .Marland Kitchen.. 1 by mouth once daily 10)  Fluticasone Propionate 50 Mcg/act Susp (Fluticasone propionate) .... 2 spray/side once daily 11)  Lomotil 2.5-0.025 Mg Tabs (Diphenoxylate-atropine) .Marland Kitchen.. 1 by mouth as needed loose stool - max 8 tabs per 24 hrs 12)  Triamcinolone Acetonide 0.1 % Crea (Triamcinolone acetonide) .... Use asd two times a day as needed  Patient  Instructions: 1)  increase the acid reflux medication to two times a day  2)  You will be contacted about the referral(s) to: GI 3)  Please go to the Lab in the basement for your blood and/or urine tests today 4)  Please call the number on the [facility] Card for results of your testing  5)  Continue all previous medications as before this visit  6)  Please schedule a follow-up appointment in 6 months with: 7)  BMP prior to visit, ICD-9: 250.02 8)  Lipid Panel prior to visit, ICD-9: 9)  HbgA1C prior to visit, ICD-9: Prescriptions: LANSOPRAZOLE 30 MG CPDR (LANSOPRAZOLE) 1po once two times a day  #60 x 11   Entered and Authorized by:   [name] MD   Signed by:   [name] MD on 12/31/2010   Method used:   Print then Give to Patient   RxID:   [id number]    Orders Added: 1)  TLB-BMP (Basic Metabolic Panel-BMET) [80048-METABOL] 2)  TLB-CBC Platelet - w/Differential [85025-CBCD] 3)  TLB-Hepatic/Liver Function Pnl [80076-HEPATIC] 4)  TLB-Lipid Panel [80061-LIPID] 5)  TLB-TSH (Thyroid Stimulating Hormone) [84443-TSH] 6)  TLB-PSA (Prostate Specific Antigen) [84153-PSA] 7)  TLB-Udip ONLY [81003-UDIP] 8)  TLB-A1C / Hgb A1C (Glycohemoglobin) [83036-A1C] 9)  TLB-Microalbumin/Creat Ratio, Urine [82043-MALB] 10)  Gastroenterology Referral [GI] 11)  Est. Patient 65& > [56213]

## 2011-01-30 ENCOUNTER — Encounter: Payer: Self-pay | Admitting: Gastroenterology

## 2011-02-05 LAB — POCT I-STAT, CHEM 8
Calcium, Ion: 1.18 mmol/L (ref 1.12–1.32)
Chloride: 107 mEq/L (ref 96–112)
HCT: 49 % (ref 39.0–52.0)
Sodium: 141 mEq/L (ref 135–145)
TCO2: 28 mmol/L (ref 0–100)

## 2011-02-05 LAB — DIFFERENTIAL
Lymphocytes Relative: 17 % (ref 12–46)
Lymphs Abs: 1.2 10*3/uL (ref 0.7–4.0)
Neutrophils Relative %: 75 % (ref 43–77)

## 2011-02-05 LAB — HEPATIC FUNCTION PANEL
ALT: 24 U/L (ref 0–53)
Alkaline Phosphatase: 47 U/L (ref 39–117)
Indirect Bilirubin: 0.6 mg/dL (ref 0.3–0.9)
Total Protein: 6.7 g/dL (ref 6.0–8.3)

## 2011-02-05 LAB — CBC
Platelets: 176 10*3/uL (ref 150–400)
WBC: 7.2 10*3/uL (ref 4.0–10.5)

## 2011-02-05 LAB — URINALYSIS, ROUTINE W REFLEX MICROSCOPIC
Bilirubin Urine: NEGATIVE
Hgb urine dipstick: NEGATIVE
Specific Gravity, Urine: 1.02 (ref 1.005–1.030)
Urobilinogen, UA: 1 mg/dL (ref 0.0–1.0)

## 2011-02-05 LAB — POCT CARDIAC MARKERS
CKMB, poc: 1.4 ng/mL (ref 1.0–8.0)
Myoglobin, poc: 62.6 ng/mL (ref 12–200)
Troponin i, poc: <0.05 ng/mL (ref 0.00–0.09)

## 2011-02-06 ENCOUNTER — Ambulatory Visit: Payer: Medicare HMO | Admitting: Gastroenterology

## 2011-02-08 ENCOUNTER — Encounter: Payer: Self-pay | Admitting: Gastroenterology

## 2011-03-04 LAB — GLUCOSE, CAPILLARY
Glucose-Capillary: 156 mg/dL — ABNORMAL HIGH (ref 70–99)
Glucose-Capillary: 169 mg/dL — ABNORMAL HIGH (ref 70–99)
Glucose-Capillary: 185 mg/dL — ABNORMAL HIGH (ref 70–99)
Glucose-Capillary: 226 mg/dL — ABNORMAL HIGH (ref 70–99)

## 2011-03-04 LAB — CBC
Hemoglobin: 15.4 g/dL (ref 13.0–17.0)
MCHC: 34.5 g/dL (ref 30.0–36.0)
MCV: 85.8 fL (ref 78.0–100.0)
Platelets: 165 10*3/uL (ref 150–400)
RBC: 5.23 MIL/uL (ref 4.22–5.81)
WBC: 6 10*3/uL (ref 4.0–10.5)

## 2011-03-04 LAB — BASIC METABOLIC PANEL
BUN: 16 mg/dL (ref 6–23)
CO2: 21 mEq/L (ref 19–32)
CO2: 26 mEq/L (ref 19–32)
Calcium: 8.8 mg/dL (ref 8.4–10.5)
Calcium: 9.3 mg/dL (ref 8.4–10.5)
Creatinine, Ser: 0.97 mg/dL (ref 0.4–1.5)
GFR calc Af Amer: >60 mL/min (ref >60)
GFR calc Af Amer: >60 mL/min (ref >60)
GFR calc non Af Amer: >60 mL/min (ref >60)
Sodium: 138 mEq/L (ref 135–145)

## 2011-03-04 LAB — PROTIME-INR: INR: 1 (ref 0.00–1.49)

## 2011-03-08 ENCOUNTER — Other Ambulatory Visit: Payer: Self-pay | Admitting: Internal Medicine

## 2011-03-29 ENCOUNTER — Other Ambulatory Visit: Payer: Self-pay | Admitting: Internal Medicine

## 2011-04-02 NOTE — Cardiovascular Report (Signed)
NAMEMIKLOS, BIDINGER              ACCOUNT NO.:  [id number]   MEDICAL RECORD NO.:  [id number]          PATIENT TYPE:  INP   LOCATION:  2507                         FACILITY:  [facility]   PHYSICIAN:  Veverly Fells. [name], MD  DATE OF BIRTH:  03/14/1932   DATE OF PROCEDURE:  12/13/2008  DATE OF DISCHARGE:                            CARDIAC CATHETERIZATION   PROCEDURES:  1. Percutaneous transluminal coronary angioplasty and stenting of the      first diagonal.  2. Percutaneous transluminal coronary angioplasty and stenting of the      proximal left anterior descending.  3. Angio-Seal right femoral artery.   INDICATION:  Mr. Dollar is a 77 year old gentleman who presented with  an abnormal Myoview.  It demonstrated anterolateral ischemia.  He  underwent diagnostic catheterization that showed severe stenosis of the  proximal LAD and first diagonal branches.  He has a small right inguinal  hernia and was evaluated by Dr. Dwain Sarna who felt that surgery could be  deferred as the patient was asymptomatic.  After careful review with the  patient, we elected to proceed with PCI of the proximal LAD and first  diagonal bifurcation.   Risks and indications of procedure were reviewed with the patient.  Informed consent was obtained.  The right groin was prepped and draped,  anesthetized 1% lidocaine.  The left groin then used for the previous  procedure and I felt that right groin could be safely accessed below the  area of the inguinal hernia.  Using modified Seldinger technique, a 6-  French sheath was placed in the right femoral artery without difficulty.  A 6-French XB LAD 3.0-cm guide catheter was used.  Angiomax was used for  anticoagulation.  Angiography was performed in multiple projections to  lay out the lesion.  There is tight stenosis involving the most proximal  portion of the LAD as well as the proximal first diagonal, which has a  very high origin and nearly represents an intermediate  vessel.  It  appears there is enough room to stent the diagonal branch and stay away  from the ostium of that vessel.  So, that the LAD can be treated as  well.  The LAD will have to be stented across the diagonal origin.  Initially, a Cougar guidewire was advanced into the diagonal branch.  A  2.0 x 12-mm apex balloon was used to predilate the vessel and the vessel  was dilated to 6 atmospheres.  The vessel appeared to be less than 2.5  mm and I  treated it with a 2.25 x 13-mm Cypher stent.  The stent was  carefully positioned, so that it was clear of the LAD and it was then  deployed at 12 atmospheres.  There was a good angiographic result.  The  stented segment was then postdilated with a 2.5 x 8-mm Voyager Clifton, which  was taken to 16 atmospheres in the midportion of the stent and the  proximal and distal edges were both dilated to 12 atmospheres.  There  was a total of 3 inflations performed and the stent appeared well  expanded.  At  that point, the LAD was wired with a second Cougar  guidewire.  The diagonal wire was left in place.  I predilated the LAD  with a 2.5 x 15-mm apex balloon, which was taken to 8 atmospheres.  It  appeared that lesion could be easily covered with a shorter stent.  I  elected to use a 3.0 x 13-mm Cypher, which was carefully positioned and  deployed at 14 atmospheres.  It also appeared well expanded.  The  diagonal was not compromised.  This LAD stent was postdilated with a  3.25 x 12-mm Voyager Bennington balloon, which was taken to 16 atmospheres on a  single inflation.  At the completion of the procedure, there was an  excellent angiographic result with widely patent stents in the LAD and  first diagonal branches.  The patient tolerated the procedure well.  A  femoral angiogram demonstrated common femoral artery access and the  arteriotomy was closed with an Angio-Seal device.   CONCLUSION:  Successful stenting of the proximal LAD and first diagonal  branches  using Cypher drug-eluting stents.   RECOMMENDATIONS:  Recommend 12 months dual antiplatelet therapy with  aspirin and Plavix.      Veverly Fells. [name], MD  Electronically Signed     MDC/MEDQ  D:  12/13/2008  T:  12/13/2008  Job:  [id number]   cc:   [name], MD  Juanetta Gosling, MD

## 2011-04-02 NOTE — Assessment & Plan Note (Signed)
[facility] HEALTHCARE                            CARDIOLOGY OFFICE NOTE   Keith, Herrera                     MRN:          [id number]  DATE:11/22/2008                            DOB:          Dec 22, 1931    REASON FOR VISIT:  Abnormal Myoview and stress scan.   HISTORY OF PRESENT ILLNESS:  Keith Herrera is a very nice 77 year old  gentleman who was seen here in 2008 for symptomatic palpitations.  His  palpitations have resolved.  His only symptom at present is that of  exertional dyspnea.  He denies chest pain or chest pressure.  He  recently underwent an exercise Myoview stress scan on November 03, 2008,  for preoperative evaluation for possible right inguinal hernia repair.  He exercised for just over 5 minutes and had ST depression with  exercise.  His stress images showed a small reversible defect in the  apical and anterolateral walls.  His EKG changes persisted 4 minutes  into recovery.  The ischemic region appeared relatively small based on  the imaging portion of the exam.   The patient is otherwise felt well.  He denies lightheadedness,  palpitations, orthopnea, PND, or edema.  He has been able to do physical  work such as chopping wood without chest pain.  He describes exertional  dyspnea with moderate level activity such as walking in a brisk pace.   CURRENT MEDICATIONS:  1. Prevacid 30 mg daily.  2. Azor 10/40 mg daily.  3. Metformin 500 mg b.i.d.  4. Aspirin 81 mg daily.  5. Crestor 20 mg daily.  6. Multivitamin 1 daily.   ALLERGIES:  LOVASTATIN.   PAST MEDICAL HISTORY:  Hypertension, dyslipidemia, low back pain, type 2  diabetes, gastroesophageal reflux disease, esophageal stricture, and  allergic rhinitis.   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  He is married, with 3 grown children.  He is a previous  smoker.   PHYSICAL EXAMINATION:  GENERAL:  The patient is alert and oriented.  He  is a very pleasant male, in no acute distress.  VITAL SIGNS:  Weight 177 pounds, blood pressure 140/70, heart rate 76,  and respiratory rate 16.  HEENT:  Normal.  NECK:  Normal carotid upstrokes.  No bruits.  JVP normal.  LUNGS:  Clear bilaterally.  HEART:  Regular rate and rhythm.  No murmurs or gallops.  ABDOMEN:  Soft, nontender.  No organomegaly.  EXTREMITIES:  No clubbing, cyanosis, or edema.  Femoral pulses are 2+  with bilateral bruits.  Pedal pulses are 2+ and equal.  SKIN:  Warm and  dry without rash.   ASSESSMENT:  1. This is a 77 year old gentleman with hypertension, diabetes, and      dyslipidemia who presents for evaluation of an abnormal Myoview      stress study.  Keith Herrera does not have major symptoms.  However,      he is a diabetic and certainly could have silent ischemia.  With      his significant ST changes as well as his ischemic defect on a  stress imaging study, I think cardiac catheterization is warranted.      In addition, he works as a Pharmacologist and I think, defining      his coronary anatomy is important.  This was all reviewed with the      patient and his wife in detail and they agreed to proceed.  Risks,      indications, and alternatives of cardiac catheterization were      reviewed.  The patient will be scheduled in the outpatient cardiac      catheterization lab.  He should continue on his current therapy,      which includes daily aspirin.  2. Hypertension.  The patient is treated by [name] and is currently      on Azor, continue at present.  3. Hyperlipidemia.  Continue with Crestor and further followup per Dr.      Jonny Ruiz.   We will determine further followup based on findings at cardiac  catheterization.     Veverly Fells. [name], MD  Electronically Signed    MDC/MedQ  DD: 11/22/2008  DT: 11/23/2008  Job #: [id number]   cc:   [name], MD

## 2011-04-02 NOTE — Assessment & Plan Note (Signed)
[facility]                                 ON-CALL NOTE   JARMEL, LINHARDT                     MRN:          [id number]  DATE:12/10/2008                            DOB:          Aug 08, 1932    PRIMARY CARDIOLOGIST:  Veverly Fells. [name], MD   I was contacted by [facility] because they had never received a  prescription for Plavix from Dr. Earmon Phoenix office.  Dr. Earmon Phoenix note  from December 09, 2008 states that the patient is supposed to be on  Plavix 75 mg daily.  This was called in with 30 tablets and 11 refills.      Theodore Demark, PA-C  Electronically Signed      Luis Abed, MD, [facility]  Electronically Signed   RB/MedQ  DD: 12/10/2008  DT: 12/10/2008  Job #: [id number]

## 2011-04-02 NOTE — Discharge Summary (Signed)
NAMEHARTFORD, MAULDEN              ACCOUNT NO.:  [id number]   MEDICAL RECORD NO.:  [id number]          PATIENT TYPE:  INP   LOCATION:  2507                         FACILITY:  [facility]   PHYSICIAN:  Veverly Fells. [name], MD  DATE OF BIRTH:  [birth year]   DATE OF ADMISSION:  12/13/2008  DATE OF DISCHARGE:  12/14/2008                               DISCHARGE SUMMARY   FINAL DIAGNOSIS:  Coronary artery disease.   SECONDARY DIAGNOSES:  1. Type 2 diabetes.  2. Hypertension.  3. Dyslipidemia.  4. Gastroesophageal reflux disease.   REASON FOR ADMISSION:  Mr. Kinker is a 77 year old gentleman with  obstructive CAD.  He had an abnormal stress test and underwent  diagnostic angiography that showed severe stenosis in the LAD and  diagonal branches.  He was admitted for planned angioplasty and stenting  of those vessels.   HOSPITAL COURSE:  The patient underwent two-vessel PCI with stenting of  the proximal LAD and first diagonal branches.  Cypher drug-eluting  stents were placed in each vessel.  The patient's procedure was  uncomplicated.  An Angio-Seal device was used to close his femoral  arteriotomy.  He had an uncomplicated overnight hospital course and was  stable the following morning with no complaints.  He will continue on  aspirin and Plavix and follow up in the office in 2 weeks.   Lab work at the day of discharge showed a potassium of 5.8, creatinine  0.97, BUN 16, glucose 153, and sodium was 137.  The potassium the day  before was 3.8.  I suspect this is a hemolyzed specimen.  The patient  was given a potassium supplementation and has normal creatinine.  We  will obtain a followup potassium when he returns for followup.  His EKG  showed no signs of hyperkalemia.   EKG showed normal sinus rhythm with sinus arrhythmia and nonspecific ST  change.   DISCHARGE MEDICATIONS:  1. Prevacid 30 mg daily.  2. Azor 10/40 mg daily.  3. Crestor 20 mg daily.  4. Multivitamin 1 daily.  5.  Metformin 500 mg b.i.d. to be resumed on December 16, 2008.  6. Plavix 75 mg daily.  7. Aspirin 325 mg daily.   HOSPITAL FOLLOWUP:  The patient will see Dr. [name] on December 29, 2008  at 2:15 p.m.  He should keep his regularly scheduled followup with Dr.  Jonny Ruiz.   SPECIAL INSTRUCTIONS:  He was instructed that he could return to work in  1 week on December 21, 2008.  Other routine post PCI instructions were  given.   CONDITION ON DISCHARGE:  Stable.      Veverly Fells. [name], MD  Electronically Signed     MDC/MEDQ  D:  12/14/2008  T:  12/14/2008  Job:  [id number]   cc:   [name], MD

## 2011-04-02 NOTE — Cardiovascular Report (Signed)
NAMEMARX, DOIG              ACCOUNT NO.:  [id number]   MEDICAL RECORD NO.:  [id number]          PATIENT TYPE:  OIB   LOCATION:  1966                         FACILITY:  [facility]   PHYSICIAN:  Veverly Fells. [name], MD  DATE OF BIRTH:  01/13/1932   DATE OF PROCEDURE:  11/25/2008  DATE OF DISCHARGE:  11/25/2008                            CARDIAC CATHETERIZATION   PROCEDURES:  1. Left heart catheterization.  2. Selective coronary angiography.  3. Left ventricular angiography.   INDICATIONS:  Mr. Gidney is a 77 year old gentleman who was recently  diagnosed with a right inguinal hernia.  He has multiple cardiac risk  factors and was referred for a Myoview stress scan.  His stress study  showed anterolateral ischemia and he was referred for cardiac  catheterization.   Risks and indications of the procedure were reviewed with the patient  and informed consent was obtained.  The right groin was prepped, draped,  and anesthetized with 1% lidocaine using modified Seldinger technique.  A 4-French sheath was placed in the right femoral artery.  Standard 4-  French Judkins catheters were used for coronary angiography and left  ventriculography.  A pullback across the aortic valve was performed.  All catheter exchanges were performed over a guidewire.  The patient  tolerated the procedure well.  There were no immediate complications.   FINDINGS:  Aortic pressure 130/66 with a mean of 92 and left ventricular  pressure 131/18.   Left ventriculography:  Left ventricular function is normal.  The LV is  poorly visualized due to catheter placement just across the aortic  valve.  However, there did not appear to be any regional wall motion  abnormalities and the LVEF is estimated at 60%.  There is no mitral  regurgitation.   Coronary angiography:  The LAD and left circumflex have separate ostia.  Left circumflex:  The circumflex is dominant.  The vessel has minor  luminal irregularities, but no  significant stenoses.  The mid-to-distal  AV groove circumflex has no more than 20% stenosis with luminal  irregularities throughout that area.  There are small first and second  OM branches.  There is a large left posterolateral branch and a moderate-  size left PDA.  There are no significant stenoses identified.   LAD:  The LAD has its own origin.  The vessel supplies a high diagonal  branch that has an 80% proximal stenosis.  The diagonal is moderate  size.  The most proximal portion of the LAD has a 75-80% stenosis  present.  The vessel then has luminal irregularity through the remaining  proximal and midportions.  There are no other significant stenoses  identified.  The second diagonal branch also arises from the proximal  portion of the LAD.  That branch is moderate size and has some diffuse  disease.  There is a 70% ostial stenosis followed by an 80% mid stenosis  just where the vessel bifurcates into 2 small branches.   Right coronary artery:  The right coronary artery is codominant with the  circumflex.  The vessel is small and diffusely diseased.  The midportion  has 50% stenosis and the distal portion has diffuse nonobstructive  stenosis present.   ASSESSMENT:  1. Severe left anterior descending artery and diagonal stenosis.  2. Nonobstructive left circumflex and right coronary artery stenosis.  3. Normal left ventricular function.   PLAN:  Mr. Heinz has significant LAD territory stenosis.  He has an  upcoming surgical evaluation for his right inguinal hernia.  We will  consider his revascularization options after his surgical assessment.  The patient is asymptomatic, but his anatomy is fairly high risk.  The  fact that the most proximal portion of the LAD is involved with 2  diagonal branches makes his anatomy somewhat unfavorable for PCI, but  this can still be considered.  We will continue his current medical  therapy and review his revascularization options as  detailed.      Veverly Fells. [name], MD  Electronically Signed     MDC/MEDQ  D:  11/30/2008  T:  11/30/2008  Job:  [id number]   cc:   [name], MD

## 2011-04-02 NOTE — Assessment & Plan Note (Signed)
[facility]                            CARDIOLOGY OFFICE NOTE   ZAKIAH, BECKERMAN                     MRN:          [id number]  DATE:12/09/2008                            DOB:          [birth year]    REASON FOR VISIT:  Followup CAD.   HISTORY OF PRESENT ILLNESS:  Mr. Gaus is a 77 year old gentleman who  underwent a Myoview stress scan demonstrating anterolateral ischemia.  He has multiple cardiac risk factors including diabetes, dyslipidemia,  and hypertension.  He had no cardiac symptoms, but had the stress test  performed for potential upcoming inguinal hernia repair.  He had  ischemic EKG changes with low-level exercise at 5 minutes and 15  seconds.  He did not have chest pain.  There was a reversible defect in  the apical and anterolateral walls.   Mr. Cape cardiac catheterization demonstrated severe stenosis of  the proximal LAD and first diagonal branch.  Since he was not having  symptoms, I elected to wait until he was seen by Dr. Dwain Sarna for  evaluation of his inguinal hernia in order to help determine his best  revascularization strategy.  The patient has a small asymptomatic right  inguinal hernia.  I discussed this with Dr. Dwain Sarna this morning and  he feels like this can be watched at the present time.  His risk of an  incarcerated hernia is extremely low.  The patient continues to have no  symptoms of chest pain, dyspnea, orthopnea, PND, or edema.   CURRENT MEDICATIONS:  1. Prevacid 30 mg daily.  2. AZOR 10/40 mg daily.  3. Metformin 500 mg b.i.d.  4. Aspirin 81 mg daily.  5. Crestor 20 mg daily.  6. Multivitamin 1 daily.   ALLERGIES:  LOVASTATIN.   PHYSICAL EXAMINATION:  GENERAL:  On exam, the patient is alert and  oriented.  No acute distress.  VITAL SIGNS:  Weight 179 pounds, blood pressure 144/80, heart rate 67,  respiratory rate 12.  HEENT:  Normal.  NECK:  Normal carotid upstrokes.  JVP normal.  LUNGS:   Clear.  HEART:  Regular rate and rhythm.  ABDOMEN:  Soft, nontender.  EXTREMITIES:  No clubbing, cyanosis, or edema.  SKIN:  Warm and dry without rash.   ASSESSMENT:  Mr. Ausborn has coronary artery disease as outlined.  He  has severe stenosis of the proximal left anterior descending and first  diagonal branches.  His risk of needing surgery in the next year is very  low based on my discussion with Dr. Dwain Sarna.  I think we should treat  him with percutaneous intervention and he will require stenting of the  LAD and diagonal branches.  I will plan on treating him with drug-  eluting stents.  The cath films were reviewed with both the patient and  his wife today.  There is some increased risk based on his anatomy.  The  lesion involves the very proximal aspect of the LAD, but there is a  separate origin of the LAD and left circumflex.  The diagonal lesion  spares the ostium, but it also is in the  proximal portion of the  diagonal branch.  The patient was started on Plavix today in the office.  He was given 300 mg and then will start 75 mg daily.  He was scheduled  for PCI next week.  Risks and indications were reviewed in detail and  the patient and his wife agreed to proceed.  Further follow up at the  time of PCI.     Veverly Fells. [name], MD  Electronically Signed    MDC/MedQ  DD: 12/09/2008  DT: 12/10/2008  Job #: [id number]   cc:   [name], MD  Juanetta Gosling, MD

## 2011-04-05 NOTE — Assessment & Plan Note (Signed)
[facility]                            CARDIOLOGY OFFICE NOTE   COSTA, JHA                     MRN:          [id number]  DATE:12/15/2006                            DOB:          June 06, 1932    Mr. Kuhnert is a 77 year old gentleman who presents today for followup  after a recent Holter monitoring.  He was initially evaluated here on  November 28, 2006 for palpitations.  He also has had some difficulty with  blood pressure control and has been on a few different anti-hypertensive  regimens for that.   He continues to have occasional resting palpitations.  Otherwise, he has  no problems.  Mr. Savage remains physically active.  He was recently  out chopping wood for several hours and had no chest pain, dyspnea or  other symptoms with that.   Regarding his anti-hypertensive regimen, he had been on Azor which is a  combination antihypertensive and had some difficulty tolerating that.  He switched back to Exforge on his own, but again has changed back to  Azor and has been doing fine on this medicine for the last 4 days.  He  reports that after doing work, he has elevated blood pressures in the  range of 140 to 160s systolic and at rest his blood pressure is  generally within normal limits.   CURRENT MEDICINES:  Include:  1. Metformin 500 mg daily.  2. Prevacid 30 mg daily.  3. Lovastatin 40 mg daily.  4. Aspirin 81 mg daily.  5. Azor.  6. Nasacort.   ALLERGIES:  NKDA.   PHYSICAL EXAMINATION:  The patient is alert and oriented.  He is in no  acute distress.  His initial blood pressure today was 149/77.  Blood pressure on my  recheck was 136/76.  Heart rate is 76.  Respiratory rate is 12.  HEENT:  Is normal.  NECK:  Normal carotid upstrokes without bruits.  Jugular venous pressure  is normal.  LUNGS:  Are clear to auscultation bilaterally.  CARDIOVASCULAR:  The heart is regular rate and rhythm without murmurs or  gallops.  ABDOMEN:   Is soft, nontender, no organomegaly.  EXTREMITIES: No clubbing, cyanosis or edema. Peripheral pulses are 2+  and equal throughout.   A 24 hour Holter monitor was reviewed. This was performed on January 11  and demonstrated that the basic rhythm was normal sinus.  There were  occasional single PVCs as well as occasional runs of SVT.  There was no  complex arrhythmia and there were no recordings in the diary.   ASSESSMENT:  1. Benign palpitations.  2. Essential hypertension.  3. Dyslipidemia.  4. Type 2 diabetes.   I have recommended a trial of beta blocker therapy, both for better  blood pressure control as well as relief of palpitations.  The patient  was started on atenolol 25 mg daily.  This could be titrated upward as  tolerated and as needed for symptoms and blood pressure control. I asked  him to continue his Azor at the current dose.  No other changes in his  medication regimen were made  today.  Mr. Erbes will follow up with Dr.  Jonny Ruiz for his regular medical care.  If he has any problems I would be  happy to see him in the future.  I did not schedule any follow up today  as I will plan on returning his regular medical care to [name] and I  will see him on a p.r.n. basis in the future.     Veverly Fells. [name], MD  Electronically Signed    MDC/MedQ  DD: 12/15/2006  DT: 12/15/2006  Job #: [id number]   cc:   [name], MD

## 2011-04-05 NOTE — Assessment & Plan Note (Signed)
[facility]                            CARDIOLOGY OFFICE NOTE   Keith Herrera, Keith Herrera                     MRN:          [id number]  DATE:11/28/2006                            DOB:          1932-04-21    CHIEF COMPLAINT:  Palpitations.   HISTORY OF PRESENT ILLNESS:  [name] is a very nice 77 year old  gentleman who presents today in self-referral for palpitations.  He has  had recurrent episodes of feeling as if his heart races and skips beats.  His symptoms have always occurred at rest.  They are fairly new onset  and he has only noticed them over the past few weeks.  They occur on a  daily basis.  He has no associated symptoms.   He has had some difficulty with tolerance of his blood pressure medicine  recently.  He has been on a combination medication called Exforge that  is a combination of amlodipine and an angiotensin receptor blocker.  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  He was then switched to a medication called Azor, which  is a similar combination antihypertensive.  He did not tolerate this  medication well secondary to side effects of feeling breathlessness, as  well as feeling like his heart is pounding in his head.  He has switched  back to Exforge on his own.   His final complaint is that of ringing in his ears and dizziness when he  lies in a supine position.   [name] has remained very active over the years.  He still runs a  tractor and cuts wood.  He works on his farm frequently and is  asymptomatic with vigorous physical activity.   CURRENT MEDICATIONS:  1. Metformin 500 mg daily.  2. Prevacid 30 mg daily.  3. Lovastatin 40 mg daily.  4. Aspirin 81 mg daily.  5. Exforge 5/320 mg daily.  6. Nasacort daily.   ALLERGIES:  NKDA.   PAST MEDICAL HISTORY:  1. Essential hypertension.  2.  Dyslipidemia.  3. Type 2 diabetes.  4. Gastroesophageal reflux disease.  5. Remote back injury.   SOCIAL HISTORY:  The patient lives alone.  He has 3 children.  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  He does not smoke  cigarettes or drink alcohol.   FAMILY HISTORY:  His mother died at age 54 of cancer.  His father died  at age 77 of natural causes.  He has a brother who passed away at age 72  of unknown causes, and a sister who passed away at age 20 of cancer.   REVIEW OF SYSTEMS:  A complete 12-point review of systems was performed.  Pertinent positives included light-headedness and gastroesophageal  reflux disease.  All other systems are reviewed and are negative, except  as described above.   PHYSICAL EXAMINATION:  The patient is alert and oriented.  He is in no  acute distress.  Weight is 174  pounds, blood pressure 139/70, heart rate 72, respiratory  rate is 12.  HEENT:  Normal.  NECK:  Normal carotid upstrokes without bruits.  Jugular venous pressure  is normal.  LUNGS:  Clear to auscultation bilaterally.  HEART:  PMI is non-displaced.  Heart is regular rate and rhythm without  murmurs or gallops.  ABDOMEN:  Soft and nontender.  No organomegaly.  No abdominal bruits.  EXTREMITIES:  No cyanosis, clubbing, or edema.  Peripheral pulses are 2+  and equal throughout.  SKIN:  Warm and dry.  LYMPHATICS:  No adenopathy.  NEUROLOGIC:  Cranial nerves 2-12 are intact.  Strength is 5/5 in the  arms and legs bilaterally.   Orthostatic vital signs showed a supine heart rate of 72, blood pressure  138/72; Sitting: Heart rate 74, blood pressure 152/74; Standing:  Blood  pressure 147/73, heart rate 71.   EKG shows normal sinus rhythm and is within normal limits.   ASSESSMENT:  [name] is a 77 year old gentleman with the following  cardiovascular problems.  1. Palpitations.  2. Essential hypertension.  3. Dyslipidemia.  4. Type 2 diabetes.    DISCUSSION:  I suspect his palpitations are benign.  We will order a 24h  Holter monitor since he has been having daily symptoms to rule out any  significant arrhythmia.  Could consider a trial of low-dose beta blocker  if he is having symptomatic PVCs or even premature atrial contractions.  Will use the Holter monitor to guide whether any therapy is necessary.  In regard to his hypertension, he appears to have reasonable control at  present with his current therapy.  Will not make any changes, and will  leave this to Dr. Raphael Gibney discretion.   I will plan on seeing [name] back in the clinic after the results  of his Holter monitor are available.     Veverly Fells. [name], MD  Electronically Signed    MDC/MedQ  DD: 11/28/2006  DT: 11/29/2006  Job #: [id number]   cc:   [name], MD

## 2011-04-29 ENCOUNTER — Encounter: Payer: Self-pay | Admitting: Cardiovascular Disease

## 2011-04-30 ENCOUNTER — Encounter: Payer: Self-pay | Admitting: Cardiovascular Disease

## 2011-04-30 ENCOUNTER — Ambulatory Visit (INDEPENDENT_AMBULATORY_CARE_PROVIDER_SITE_OTHER): Payer: Medicare HMO | Admitting: Cardiovascular Disease

## 2011-04-30 VITALS — BP 144/86 | HR 75 | Ht 72.0 in | Wt 170.0 lb

## 2011-04-30 DIAGNOSIS — I10 Essential (primary) hypertension: Secondary | ICD-10-CM

## 2011-04-30 DIAGNOSIS — I2510 Atherosclerotic heart disease of native coronary artery without angina pectoris: Secondary | ICD-10-CM

## 2011-04-30 NOTE — Assessment & Plan Note (Signed)
The patient is stable without angina. His electrocardiogram is normal. However, his initial presentation was also without symptoms. Recommend exercise treadmill stress testing to evaluate exercise capacity and rule out significant angina or EKG changes with exertion. Otherwise he should continue his current medical program. The patient is on low-dose aspirin and has been off of Plavix for about one year now.

## 2011-04-30 NOTE — Assessment & Plan Note (Signed)
The patient is on losartan. His office blood pressure is slightly above goal. Will continue to monitor and will assess his blood pressure response to exercise with his treadmill study.

## 2011-04-30 NOTE — Progress Notes (Signed)
HPI:  This is a 77 year old gentleman presented for followup evaluation. Patient has CAD and he underwent stenting of the proximal LAD and proximal diagonal branch back in 2010. The procedure was uncomplicated and he has done well without recurrent ischemia since then. He discontinued Plavix about one year ago. He denies chest pain, dyspnea, edema, palpitations, lightheadedness, or syncope. He exercises regularly and denies exertional symptoms. He has had a right inguinal hernia that has been followed conservatively and really does not cause him much pain or difficulty.  Outpatient Encounter Prescriptions as of 04/30/2011  Medication Sig Dispense Refill  . aspirin 81 MG tablet Take 81 mg by mouth daily.        Marland Kitchen atorvastatin (LIPITOR) 10 MG tablet Take 10 mg by mouth daily.        . lansoprazole (PREVACID) 30 MG capsule 30 mg. One tablet by mouth twice a day       . losartan (COZAAR) 100 MG tablet TAKE 1 TABLET EVERY DAY  90 tablet  3  . metFORMIN (GLUCOPHAGE) 500 MG tablet 500 mg. 2 tablets by mouth once daily       . sildenafil (VIAGRA) 100 MG tablet Take 100 mg by mouth daily as needed.        . triamcinolone (KENALOG) 0.1 % cream Apply 1 application topically as needed.       Marland Kitchen DISCONTD: amLODipine (NORVASC) 10 MG tablet Take 10 mg by mouth daily.        Marland Kitchen DISCONTD: diphenoxylate-atropine (LOMOTIL) 2.5-0.025 MG per tablet One tablet by mouth as needed for loose stool-max 8 tabs per 24 hours       . DISCONTD: fluticasone (FLONASE) 50 MCG/ACT nasal spray 2 sprays by Nasal route daily.        Marland Kitchen DISCONTD: glucose blood test strip 1 each. Use two times daily       . DISCONTD: LIPITOR 10 MG tablet TAKE 1 TABLET EVERY DAY  90 tablet  1  . DISCONTD: ONE TOUCH LANCETS MISC Used two times daily         Allergies  Allergen Reactions  . Crestor (Rosuvastatin Calcium)   . Lovastatin     REACTION: rash    Past Medical History  Diagnosis Date  . GERD (gastroesophageal reflux disease)   . Hiatal  hernia   . Hemorrhoids   . Adenomatous polyp of colon 05/2004  . Diverticulosis   . Hypertension   . Hyperlipidemia   . Diabetes type 2, controlled   . Anxiety   . CAD (coronary artery disease)   . Right inguinal hernia   . Esophageal stricture   . Personal history of urinary calculi   . ED (erectile dysfunction)   . Allergic rhinitis   . IBS (irritable bowel syndrome)   . BPH (benign prostatic hyperplasia)     ROS: Negative except as per HPI  BP 144/86  Pulse 75  Ht 6' (1.829 m)  Wt 170 lb (77.111 kg)  BMI 23.06 kg/m2  PHYSICAL EXAM: Pt is alert and oriented, NAD HEENT: normal Neck: JVP - normal, carotids 2+= without bruits Lungs: CTA bilaterally CV: RRR without murmur or gallop Abd: soft, NT, Positive BS, no hepatomegaly Ext: no C/C/E, distal pulses intact and equal Skin: warm/dry no rash  EKG:  Normal sinus rhythm 64 beats per minute, within normal limits.  ASSESSMENT AND PLAN:

## 2011-04-30 NOTE — Patient Instructions (Signed)
Your physician has requested that you have an exercise tolerance test with Tereso Newcomer PA-C. For further information please visit [email or web address] Please also follow instruction sheet, as given.  Your physician wants you to follow-up in: 1 YEAR with Dr [name].  You will receive a reminder letter in the mail two months in advance. If you don't receive a letter, please call our office to schedule the follow-up appointment.  Your physician recommends that you continue on your current medications as directed. Please refer to the Current Medication list given to you today.

## 2011-05-14 ENCOUNTER — Encounter: Payer: Self-pay | Admitting: *Deleted

## 2011-05-14 ENCOUNTER — Ambulatory Visit (INDEPENDENT_AMBULATORY_CARE_PROVIDER_SITE_OTHER): Payer: Medicare HMO | Admitting: Physician Assistant

## 2011-05-14 DIAGNOSIS — I2510 Atherosclerotic heart disease of native coronary artery without angina pectoris: Secondary | ICD-10-CM

## 2011-05-14 LAB — BASIC METABOLIC PANEL
CO2: 27 mEq/L (ref 19–32)
Calcium: 9.3 mg/dL (ref 8.4–10.5)
Chloride: 105 mEq/L (ref 96–112)
Glucose, Bld: 91 mg/dL (ref 70–99)
Sodium: 142 mEq/L (ref 135–145)

## 2011-05-14 LAB — CBC WITH DIFFERENTIAL/PLATELET
Basophils Absolute: 0.1 10*3/uL (ref 0.0–0.1)
Basophils Relative: 1.4 % (ref 0.0–3.0)
Eosinophils Absolute: 0.1 10*3/uL (ref 0.0–0.7)
Hemoglobin: 16.7 g/dL (ref 13.0–17.0)
Lymphocytes Relative: 25.6 % (ref 12.0–46.0)
Lymphs Abs: 1.7 10*3/uL (ref 0.7–4.0)
MCHC: 34.4 g/dL (ref 30.0–36.0)
MCV: 89.3 fl (ref 78.0–100.0)
Monocytes Absolute: 0.6 10*3/uL (ref 0.1–1.0)
Neutro Abs: 4.3 10*3/uL (ref 1.4–7.7)
RDW: 13.3 % (ref 11.5–14.6)

## 2011-05-14 NOTE — Patient Instructions (Signed)
Your physician has requested that you have a cardiac catheterization 05/17/11 @ 10:30 with Dr. [name]. Cardiac catheterization is used to diagnose and/or treat various heart conditions. Doctors may recommend this procedure for a number of different reasons. The most common reason is to evaluate chest pain. Chest pain can be a symptom of coronary artery disease (CAD), and cardiac catheterization can show whether plaque is narrowing or blocking your heart's arteries. This procedure is also used to evaluate the valves, as well as measure the blood flow and oxygen levels in different parts of your heart. For further information please visit [email or web address] Please follow instruction sheet, as given.   Your physician recommends that you return for lab work in: TODAY BMET, CBC W/DIFF, PT/INR PRE CATH LABS

## 2011-05-14 NOTE — Progress Notes (Signed)
Exercise Treadmill Test  Pre-Exercise Testing Evaluation Rhythm: normal sinus  Rate: 66   PR:  .13 QRS:  .08  QT:  .39 QTc: .41     Test  Exercise Tolerance Test Ordering MD: [name], MD  Interpreting MD:  Tereso Newcomer PA-C  Unique Test No: 1  Treadmill:  1  Indication for ETT: CAD  Contraindication to ETT: No   Stress Modality: exercise - treadmill  Cardiac Imaging Performed: non   Protocol: standard Bruce - maximal  Max BP:192/72  Max MPHR (bpm):  143 85% MPR (bpm):  121  MPHR obtained (bpm):142 % MPHR obtained:  99  Reached 85% MPHR (min:sec): 4:50 Total Exercise Time (min-sec):  7:30  Workload in METS:  9.1 Borg Scale: 15  Reason ETT Terminated:  desired heart rate attained    ST Segment Analysis At Rest: normal ST segments - no evidence of significant ST depression With Exercise: significant ischemic ST depression  Other Information Arrhythmia:  No Angina during ETT:  absent (0) Quality of ETT:  diagnostic  ETT Interpretation:  abnormal - evidence of ST depression consistent with ischemia  Comments: Good exercise tolerance. No chest pain. Normal BP response to exercise. 1 mm ST segment depression in infero-lateral leads.  Recommendations: Discussed with Dr. [name] who also saw patient. Patient contemplating hernia repair. He had no symptoms with prior event. Will set him up for cardiac cath with Dr. [name]. Risks and benefits of cardiac catheterization have been discussed with the patient.  These include bleeding, infection, kidney damage, stroke, heart attack, death.  The patient understands these risks and is willing to proceed. Remain on ASA.  Will give him NTG to use PRN. 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  We have advised him to cancel his trip until he has his cardiac cath.

## 2011-05-15 ENCOUNTER — Telehealth: Payer: Self-pay | Admitting: *Deleted

## 2011-05-15 NOTE — Telephone Encounter (Signed)
Pre- cath labs reviewed with Dr. Johney Frame DOD. BUN 25(H) Dr. Johney Frame recommends for pt. To drink plenty of water prior cath. Pt' wife aware. She verbalized understanding.

## 2011-05-17 ENCOUNTER — Encounter: Payer: Self-pay | Admitting: *Deleted

## 2011-05-17 ENCOUNTER — Inpatient Hospital Stay (HOSPITAL_BASED_OUTPATIENT_CLINIC_OR_DEPARTMENT_OTHER)
Admission: RE | Admit: 2011-05-17 | Discharge: 2011-05-17 | Disposition: A | Discharge status: Home / Self Care | Payer: Medicare HMO | Source: Ambulatory Visit | Attending: Cardiovascular Disease | Admitting: Cardiovascular Disease

## 2011-05-17 DIAGNOSIS — E119 Type 2 diabetes mellitus without complications: Secondary | ICD-10-CM | POA: Insufficient documentation

## 2011-05-17 DIAGNOSIS — Z9861 Coronary angioplasty status: Secondary | ICD-10-CM | POA: Insufficient documentation

## 2011-05-17 DIAGNOSIS — I2510 Atherosclerotic heart disease of native coronary artery without angina pectoris: Secondary | ICD-10-CM

## 2011-05-17 DIAGNOSIS — R9439 Abnormal result of other cardiovascular function study: Secondary | ICD-10-CM | POA: Insufficient documentation

## 2011-05-30 ENCOUNTER — Encounter: Payer: Self-pay | Admitting: Physician Assistant

## 2011-06-05 ENCOUNTER — Encounter (INDEPENDENT_AMBULATORY_CARE_PROVIDER_SITE_OTHER): Payer: Self-pay | Admitting: General Surgery

## 2011-06-05 ENCOUNTER — Ambulatory Visit (INDEPENDENT_AMBULATORY_CARE_PROVIDER_SITE_OTHER): Payer: Medicare HMO | Admitting: General Surgery

## 2011-06-05 ENCOUNTER — Ambulatory Visit: Payer: Medicare HMO | Admitting: Physician Assistant

## 2011-06-05 VITALS — BP 148/68 | HR 64 | Temp 96.2°F | Ht 70.5 in | Wt 171.4 lb

## 2011-06-05 DIAGNOSIS — R21 Rash and other nonspecific skin eruption: Secondary | ICD-10-CM

## 2011-06-05 DIAGNOSIS — K4030 Unilateral inguinal hernia, with obstruction, without gangrene, not specified as recurrent: Secondary | ICD-10-CM

## 2011-06-05 NOTE — Progress Notes (Signed)
Keith Herrera is a 77 y.o. male.    Chief Complaint  Patient presents with  . Other    eval of rih    HPI HPI This is a 77 year old male who Y. know from a previous visit. He has coronary artery disease and is followed by Dr. [name]. He had a right groin hernia previously that has been present for some time now. This causes some occasional soreness. It still goes back in at night per his report. He had an episode and sure whether the last couple of months where it sounds like this area would not reduce and it was eventually reduced by the emergency room he felt much better. He's had a recent cardiac catheterization which he said was normal also. He comes in today to discuss having this repaired. He has no trouble voiding and has no change in his bowel movements.  Past Medical History  Diagnosis Date  . GERD (gastroesophageal reflux disease)   . Hiatal hernia   . Hemorrhoids   . Adenomatous polyp of colon 05/2004  . Diverticulosis     colon  . Hypertension   . Hyperlipidemia   . Diabetes type 2, controlled   . Anxiety   . CAD (coronary artery disease)   . Right inguinal hernia   . Esophageal stricture   . Personal history of urinary calculi   . ED (erectile dysfunction)   . Allergic rhinitis   . IBS (irritable bowel syndrome)   . BPH (benign prostatic hyperplasia)   . Other nonspecific abnormal cardiovascular system function study   . Pain in limb     left leg  . Back pain   . Routine general medical examination at a health care facility   . Preoperative examination, unspecified   . Special screening for malignant neoplasm of prostate   . Rash and other nonspecific skin eruption   . Urinary frequency     Past Surgical History  Procedure Date  . Coronary stent placement 10/2008    x 2    Family History  Problem Relation Age of Onset  . Cancer Mother 74    unknown cancer  . Cancer Sister 70    unknown cancer    Social History History  Substance Use  Topics  . Smoking status: Former Games developer  . Smokeless tobacco: Not on file  . Alcohol Use: No    Allergies  Allergen Reactions  . Crestor (Rosuvastatin Calcium) Rash    All over body  . Lovastatin     REACTION: rash    Current Outpatient Prescriptions  Medication Sig Dispense Refill  . aspirin 81 MG tablet Take 81 mg by mouth daily.        Marland Kitchen atorvastatin (LIPITOR) 10 MG tablet Take 10 mg by mouth daily.        . lansoprazole (PREVACID) 30 MG capsule 30 mg. One tablet by mouth twice a day       . losartan (COZAAR) 100 MG tablet TAKE 1 TABLET EVERY DAY  90 tablet  3  . metFORMIN (GLUCOPHAGE) 500 MG tablet 500 mg. 2 tablets by mouth once daily       . ONE TOUCH LANCETS MISC by Does not apply route 2 (two) times daily as needed.        . sildenafil (VIAGRA) 100 MG tablet Take 100 mg by mouth daily as needed.        Marland Kitchen amLODipine (NORVASC) 10 MG tablet Take 10 mg by mouth daily.        Marland Kitchen  diphenoxylate-atropine (LOMOTIL) 2.5-0.025 MG per tablet Take 1 tablet by mouth 4 (four) times daily as needed.        . fluticasone (FLONASE) 50 MCG/ACT nasal spray Place 2 sprays into the nose daily.        Marland Kitchen triamcinolone (KENALOG) 0.1 % cream Apply 1 application topically as needed.         Review of Systems Review of Systems  Constitutional: Negative.   HENT: Negative.   Eyes: Negative.   Respiratory: Negative.   Cardiovascular: Negative.   Gastrointestinal: Negative.   Genitourinary: Negative.   Musculoskeletal: Negative.   Neurological: Negative.   Endo/Heme/Allergies: Negative.     Physical Exam Physical Exam  Constitutional: He appears well-developed and well-nourished.  HENT:  Head: Normocephalic and atraumatic.  Eyes: No scleral icterus.  Neck: Neck supple.  Cardiovascular: Normal rate, regular rhythm and normal heart sounds.   Respiratory: Effort normal and breath sounds normal.  GI: Soft. Bowel sounds are normal. He exhibits no distension. There is no tenderness. There is no  rebound and no guarding. A hernia is present. Hernia confirmed positive in the right inguinal area (chronically incarcerated scrotal hernia). Hernia confirmed negative in the left inguinal area.  Lymphadenopathy:    He has no cervical adenopathy.       Right: No inguinal adenopathy present.       Left: No inguinal adenopathy present.     Blood pressure 148/68, pulse 64, temperature 96.2 F (35.7 C), temperature source Temporal, height 5' 10.5" (1.791 m), weight 171 lb 6.4 oz (77.747 kg).  Assessment/Plan Right inguinal hernia, chronically incarcerated  I recommended today and open right heel repair. This is a scrotal hernia that has become very symptomatic. I told him that observation was not really an option for this and I strongly recommended in having it repaired at this point. Discussed the risk including bleeding, infection, recurrence and chronic groin pain. I also discussed the cardiac risks. He's been catheterized recently by Dr. [name] and he appears to be as well as ischemic it from his standpoint. I may keep him on his 81 mg of aspirin throughout his operation.  Donyale Falcon 06/05/2011, 2:22 PM

## 2011-06-06 ENCOUNTER — Ambulatory Visit: Payer: Medicare HMO | Admitting: Physician Assistant

## 2011-06-06 NOTE — Cardiovascular Report (Signed)
Keith Herrera, Keith Herrera              ACCOUNT NO.:  [id number]  MEDICAL RECORD NO.:  [id number]  LOCATION:                                 FACILITY:  PHYSICIAN:  Veverly Fells. [name], MD  DATE OF BIRTH:  January 03, 1932  DATE OF PROCEDURE:  05/17/2011 DATE OF DISCHARGE:                           CARDIAC CATHETERIZATION   PROCEDURES: 1. Left heart catheterization. 2. Selective coronary angiography. 3. Left ventricular angiography.  PROCEDURAL INDICATION:  Keith Herrera is a 77 year old gentleman with type 2 diabetes who has previously had significant asymptomatic coronary ischemia.  He underwent a complex stenting procedure of the LAD/diagonal branch a few years back.  He underwent an exercise treadmill study for coronary surveillance in the setting of his previous asymptomatic presentation.  The study was abnormal with significant ST depression at peak exercise.  He was referred back for cardiac cath to fully assess.  PROCEDURAL DETAILS:  Risks and indications of procedure were reviewed with the patient.  Informed consent was obtained.  The left groin was prepped, draped, and anesthetized with 1% lidocaine using modified Seldinger technique.  A 4-French sheath was placed in the left femoral artery.  Standard Judkins catheters were used for coronary angiography and left ventriculography.  The procedure was well tolerated without immediate complications.  PROCEDURAL FINDINGS:  Aortic pressure 142/68 with a mean of 97, left ventricular pressure is 148/15.  Left ventriculography shows normal LV function.  The ejection fraction is 65%.  Coronary angiography.  The LAD and left circumflex have separate ostia. The circumflex has no obstructive disease.  It courses down and supplies a small first OM branch, a large left posterolateral branch and the left PDA.  There is no obstructive disease throughout the course of the left circumflex.  LAD:  The LAD has a proximal/ostial stent that is  widely patent.  There is no restenosis.  Just beyond the stent, there is 30% stenosis at the origin of the first diagonal.  The remaining portions of the mid distal LAD have no significant obstructive disease.  The first diagonal which is very proximally originating diagonal has a widely patent stent with no significant in-stent restenosis.  The second diagonal branch has severe ostial stenosis of 90% and mid stenosis of 80-90%.  This is a small caliber, diffusely diseased vessel.  Right coronary artery.  The right coronary artery is of moderate caliber.  It is a nondominant RCA with an 80-90% mid stenosis.  FINAL ASSESSMENT: 1. Wide patency of the stents in the left anterior descending and     diagonal branches. 2. Patent, dominant left circumflex with no obstructive disease. 3. Severe second diagonal stenosis (small vessel). 4. Severe stenosis of a nondominant right coronary artery. 5. Normal left ventricular function.  The patient stents are widely patent.  His diagonal is small and not amenable to PCI.  The right coronary artery is also a small vessel width and it is nondominant.  The patient should continue with medical therapy.  He has an upcoming evaluation for right inguinal hernia repair and considering his lack of symptoms and stability of his coronary artery disease, he should be at low risk of perioperative complications and can proceed without further  testing.     Veverly Fells. [name], MD     MDC/MEDQ  D:  05/17/2011  T:  05/18/2011  Job:  [id number]  cc:   [name], MD Juanetta Gosling, MD Tereso Newcomer, PA-C  Electronically Signed by [name] MD on 06/06/2011 12:36:21 AM

## 2011-06-11 ENCOUNTER — Encounter (INDEPENDENT_AMBULATORY_CARE_PROVIDER_SITE_OTHER): Payer: Self-pay

## 2011-06-13 ENCOUNTER — Encounter: Payer: Self-pay | Admitting: Physician Assistant

## 2011-06-13 ENCOUNTER — Encounter: Payer: Self-pay | Admitting: Cardiovascular Disease

## 2011-06-19 ENCOUNTER — Ambulatory Visit: Payer: Medicare HMO | Admitting: Physician Assistant

## 2011-06-24 ENCOUNTER — Other Ambulatory Visit: Payer: Self-pay | Admitting: Internal Medicine

## 2011-06-24 ENCOUNTER — Other Ambulatory Visit: Payer: Medicare HMO

## 2011-06-25 ENCOUNTER — Telehealth: Payer: Self-pay | Admitting: Cardiovascular Disease

## 2011-06-25 NOTE — Telephone Encounter (Signed)
All Cardiac faxed to [phone]  06/25/11/km

## 2011-07-01 ENCOUNTER — Other Ambulatory Visit (INDEPENDENT_AMBULATORY_CARE_PROVIDER_SITE_OTHER): Payer: Self-pay | Admitting: General Surgery

## 2011-07-01 ENCOUNTER — Ambulatory Visit (INDEPENDENT_AMBULATORY_CARE_PROVIDER_SITE_OTHER): Payer: Medicare HMO | Admitting: Internal Medicine

## 2011-07-01 ENCOUNTER — Encounter: Payer: Self-pay | Admitting: Internal Medicine

## 2011-07-01 ENCOUNTER — Encounter (HOSPITAL_COMMUNITY): Payer: Medicare HMO

## 2011-07-01 ENCOUNTER — Ambulatory Visit (HOSPITAL_COMMUNITY)
Admission: RE | Admit: 2011-07-01 | Discharge: 2011-07-01 | Disposition: A | Discharge status: Home / Self Care | Payer: Medicare HMO | Source: Ambulatory Visit | Attending: General Surgery | Admitting: General Surgery

## 2011-07-01 VITALS — BP 138/70 | HR 63 | Temp 97.0°F | Ht 71.0 in | Wt 174.1 lb

## 2011-07-01 DIAGNOSIS — K4090 Unilateral inguinal hernia, without obstruction or gangrene, not specified as recurrent: Secondary | ICD-10-CM | POA: Insufficient documentation

## 2011-07-01 DIAGNOSIS — Z01812 Encounter for preprocedural laboratory examination: Secondary | ICD-10-CM | POA: Insufficient documentation

## 2011-07-01 DIAGNOSIS — I10 Essential (primary) hypertension: Secondary | ICD-10-CM

## 2011-07-01 DIAGNOSIS — Z0000 Encounter for general adult medical examination without abnormal findings: Secondary | ICD-10-CM | POA: Insufficient documentation

## 2011-07-01 DIAGNOSIS — Z01818 Encounter for other preprocedural examination: Secondary | ICD-10-CM

## 2011-07-01 DIAGNOSIS — E785 Hyperlipidemia, unspecified: Secondary | ICD-10-CM

## 2011-07-01 DIAGNOSIS — Z0001 Encounter for general adult medical examination with abnormal findings: Secondary | ICD-10-CM | POA: Insufficient documentation

## 2011-07-01 DIAGNOSIS — E119 Type 2 diabetes mellitus without complications: Secondary | ICD-10-CM

## 2011-07-01 LAB — BASIC METABOLIC PANEL
BUN: 17 mg/dL (ref 6–23)
CO2: 29 mEq/L (ref 19–32)
Calcium: 9.9 mg/dL (ref 8.4–10.5)
Chloride: 105 mEq/L (ref 96–112)
Creatinine, Ser: 0.9 mg/dL (ref 0.50–1.35)

## 2011-07-01 LAB — HEMOGLOBIN A1C: Hgb A1c MFr Bld: 7.7 % — ABNORMAL HIGH (ref <5.7)

## 2011-07-01 LAB — SURGICAL PCR SCREEN
MRSA, PCR: NEGATIVE
Staphylococcus aureus: NEGATIVE

## 2011-07-01 LAB — DIFFERENTIAL
Eosinophils Absolute: 0.1 10*3/uL (ref 0.0–0.7)
Eosinophils Relative: 2 % (ref 0–5)
Lymphocytes Relative: 21 % (ref 12–46)
Lymphs Abs: 1.3 10*3/uL (ref 0.7–4.0)
Monocytes Relative: 8 % (ref 3–12)
Neutrophils Relative %: 69 % (ref 43–77)

## 2011-07-01 LAB — LIPID PANEL
LDL Cholesterol: 91 mg/dL (ref 0–99)
Total CHOL/HDL Ratio: 4.4 RATIO
VLDL: 33 mg/dL (ref 0–40)

## 2011-07-01 LAB — CBC
HCT: 45.5 % (ref 39.0–52.0)
MCH: 29.5 pg (ref 26.0–34.0)
MCV: 86.5 fL (ref 78.0–100.0)
Platelets: 154 10*3/uL (ref 150–400)
RBC: 5.26 MIL/uL (ref 4.22–5.81)
WBC: 6.3 10*3/uL (ref 4.0–10.5)

## 2011-07-01 NOTE — Assessment & Plan Note (Signed)
stable overall by hx and exam, most recent data reviewed with pt, and pt to continue medical treatment as before  Lab Results  Component Value Date   LDLCALC 81 12/31/2010

## 2011-07-01 NOTE — Assessment & Plan Note (Signed)
stable overall by hx and exam, most recent data reviewed with pt, and pt to continue medical treatment as before  Lab Results  Component Value Date   HGBA1C 7.1* 12/31/2010   To  Check a1c and lipids today with preop labs at [facility] long lab

## 2011-07-01 NOTE — Assessment & Plan Note (Signed)
stable overall by hx and exam, most recent data reviewed with pt, and pt to continue medical treatment as before  BP Readings from Last 3 Encounters:  07/01/11 138/70  06/05/11 148/68  04/30/11 144/86

## 2011-07-01 NOTE — Patient Instructions (Signed)
Continue all other medications as before Please present the order for your labs today at the [facility] after you leave here today Please return in 6 mo with Lab testing done 3-5 days before

## 2011-07-01 NOTE — Progress Notes (Signed)
Subjective:    Patient ID: Keith Herrera, male    DOB: 08-06-1932, 77 y.o.   MRN: [id number]  HPI  Here to f/u; overall doing ok,  Pt denies chest pain, increased sob or doe, wheezing, orthopnea, PND, increased LE swelling, palpitations, dizziness or syncope.  Pt denies new neurological symptoms such as new headache, or facial or extremity weakness or numbness   Pt denies polydipsia, polyuria, or low sugar symptoms such as weakness or confusion improved with po intake.  Pt states overall good compliance with meds, trying to follow lower cholesterol, diabetic diet, wt overall stable but little exercise however.  Most cbg's in the lower 10-0's  Due for preop lbas later today, due to Inguinald hernia repair aug 22 per Dr Dwain Sarna.  S/p recent catheterization. Past Medical History  Diagnosis Date  . GERD (gastroesophageal reflux disease)   . Hiatal hernia   . Hemorrhoids   . Adenomatous polyp of colon 05/2004  . Diverticulosis     colon  . Hypertension   . Hyperlipidemia   . Diabetes type 2, controlled   . Anxiety   . CAD (coronary artery disease)   . Right inguinal hernia   . Esophageal stricture   . Personal history of urinary calculi   . ED (erectile dysfunction)   . Allergic rhinitis   . IBS (irritable bowel syndrome)   . BPH (benign prostatic hyperplasia)   . Other nonspecific abnormal cardiovascular system function study   . Pain in limb     left leg  . Back pain   . Routine general medical examination at a health care facility   . Preoperative examination, unspecified   . Special screening for malignant neoplasm of prostate   . Rash and other nonspecific skin eruption   . Urinary frequency    Past Surgical History  Procedure Date  . Coronary stent placement 10/2008    x 2    reports that he has quit smoking. He does not have any smokeless tobacco history on file. He reports that he does not drink alcohol or use illicit drugs. family history includes Cancer (age of  onset:79) in his mother and Cancer (age of onset:80) in his sister. Allergies  Allergen Reactions  . Crestor (Rosuvastatin Calcium) Rash    All over body  . Lovastatin     REACTION: rash   Current Outpatient Prescriptions on File Prior to Visit  Medication Sig Dispense Refill  . amLODipine (NORVASC) 10 MG tablet Take 10 mg by mouth daily.        Marland Kitchen aspirin 81 MG tablet Take 81 mg by mouth daily.        Marland Kitchen atorvastatin (LIPITOR) 10 MG tablet Take 10 mg by mouth daily.        . lansoprazole (PREVACID) 30 MG capsule 30 mg. One tablet by mouth twice a day       . losartan (COZAAR) 100 MG tablet TAKE 1 TABLET EVERY DAY  90 tablet  3  . metFORMIN (GLUCOPHAGE) 500 MG tablet 500 mg. 2 tablets by mouth once daily       . ONE TOUCH LANCETS MISC by Does not apply route 2 (two) times daily as needed.        . sildenafil (VIAGRA) 100 MG tablet Take 100 mg by mouth daily as needed.        . triamcinolone (KENALOG) 0.1 % cream Apply 1 application topically as needed.        Review of Systems  Review of Systems  Constitutional: Negative for diaphoresis and unexpected weight change.  HENT: Negative for drooling and tinnitus.   Eyes: Negative for photophobia and visual disturbance.  Respiratory: Negative for choking and stridor.   Gastrointestinal: Negative for vomiting and blood in stool.  Genitourinary: Negative for hematuria and decreased urine volume.       Objective:   Physical Exam BP 138/70  Pulse 63  Temp(Src) 97 F (36.1 C) (Oral)  Ht 5\' 11"  (1.803 m)  Wt 174 lb 2 oz (78.983 kg)  BMI 24.29 kg/m2  SpO2 96% Physical Exam  VS noted Constitutional: Pt appears well-developed and well-nourished.  HENT: Head: Normocephalic.  Right Ear: External ear normal.  Left Ear: External ear normal.  Eyes: Conjunctivae and EOM are normal. Pupils are equal, round, and reactive to light.  Neck: Normal range of motion. Neck supple.  Cardiovascular: Normal rate and regular rhythm.   Pulmonary/Chest:  Effort normal and breath sounds normal.  Abd:  Soft, NT, non-distended, + BS Neurological: Pt is alert. No cranial nerve deficit.  Skin: Skin is warm. No erythema.  Psychiatric: Pt behavior is normal. Thought content normal.  Inguinal area not examined       Assessment & Plan:

## 2011-07-10 ENCOUNTER — Inpatient Hospital Stay (HOSPITAL_COMMUNITY)
Admission: RE | Admit: 2011-07-10 | Discharge: 2011-07-11 | DRG: 988 | Disposition: A | Discharge status: Home / Self Care | Payer: Medicare HMO | Source: Ambulatory Visit | Attending: General Surgery | Admitting: General Surgery

## 2011-07-10 DIAGNOSIS — Z01812 Encounter for preprocedural laboratory examination: Secondary | ICD-10-CM

## 2011-07-10 DIAGNOSIS — Z79899 Other long term (current) drug therapy: Secondary | ICD-10-CM

## 2011-07-10 DIAGNOSIS — Z7982 Long term (current) use of aspirin: Secondary | ICD-10-CM

## 2011-07-10 DIAGNOSIS — K589 Irritable bowel syndrome without diarrhea: Secondary | ICD-10-CM | POA: Diagnosis present

## 2011-07-10 DIAGNOSIS — N400 Enlarged prostate without lower urinary tract symptoms: Secondary | ICD-10-CM | POA: Diagnosis present

## 2011-07-10 DIAGNOSIS — E119 Type 2 diabetes mellitus without complications: Secondary | ICD-10-CM | POA: Diagnosis present

## 2011-07-10 DIAGNOSIS — E785 Hyperlipidemia, unspecified: Secondary | ICD-10-CM | POA: Diagnosis present

## 2011-07-10 DIAGNOSIS — Z9861 Coronary angioplasty status: Secondary | ICD-10-CM

## 2011-07-10 DIAGNOSIS — F411 Generalized anxiety disorder: Secondary | ICD-10-CM | POA: Diagnosis present

## 2011-07-10 DIAGNOSIS — I2510 Atherosclerotic heart disease of native coronary artery without angina pectoris: Secondary | ICD-10-CM | POA: Diagnosis present

## 2011-07-10 DIAGNOSIS — K5730 Diverticulosis of large intestine without perforation or abscess without bleeding: Secondary | ICD-10-CM | POA: Diagnosis present

## 2011-07-10 DIAGNOSIS — I447 Left bundle-branch block, unspecified: Secondary | ICD-10-CM | POA: Diagnosis present

## 2011-07-10 DIAGNOSIS — I4949 Other premature depolarization: Secondary | ICD-10-CM

## 2011-07-10 DIAGNOSIS — K4030 Unilateral inguinal hernia, with obstruction, without gangrene, not specified as recurrent: Secondary | ICD-10-CM | POA: Diagnosis present

## 2011-07-10 DIAGNOSIS — K219 Gastro-esophageal reflux disease without esophagitis: Secondary | ICD-10-CM | POA: Diagnosis present

## 2011-07-10 DIAGNOSIS — K4090 Unilateral inguinal hernia, without obstruction or gangrene, not specified as recurrent: Secondary | ICD-10-CM

## 2011-07-10 DIAGNOSIS — I10 Essential (primary) hypertension: Secondary | ICD-10-CM | POA: Diagnosis present

## 2011-07-10 DIAGNOSIS — K449 Diaphragmatic hernia without obstruction or gangrene: Secondary | ICD-10-CM | POA: Diagnosis present

## 2011-07-10 DIAGNOSIS — I491 Atrial premature depolarization: Principal | ICD-10-CM | POA: Diagnosis present

## 2011-07-10 LAB — BASIC METABOLIC PANEL
BUN: 24 mg/dL — ABNORMAL HIGH (ref 6–23)
CO2: 28 mEq/L (ref 19–32)
Chloride: 104 mEq/L (ref 96–112)
GFR calc non Af Amer: >60 mL/min (ref >60)
Glucose, Bld: 158 mg/dL — ABNORMAL HIGH (ref 70–99)
Potassium: 4.2 mEq/L (ref 3.5–5.1)
Sodium: 139 mEq/L (ref 135–145)

## 2011-07-10 LAB — CARDIAC PANEL(CRET KIN+CKTOT+MB+TROPI)
CK, MB: 3.4 ng/mL (ref 0.3–4.0)
CK, MB: 3.2 ng/mL (ref 0.3–4.0)
Relative Index: INVALID (ref 0.0–2.5)
Total CK: 85 U/L (ref 7–232)
Troponin I: <0.3 ng/mL (ref <0.30)

## 2011-07-10 LAB — GLUCOSE, CAPILLARY
Glucose-Capillary: 168 mg/dL — ABNORMAL HIGH (ref 70–99)
Glucose-Capillary: 224 mg/dL — ABNORMAL HIGH (ref 70–99)

## 2011-07-11 LAB — GLUCOSE, CAPILLARY: Glucose-Capillary: 160 mg/dL — ABNORMAL HIGH (ref 70–99)

## 2011-07-15 NOTE — Consult Note (Signed)
NAMEZAKIAH, BECKERMAN NO.:  [id number]  MEDICAL RECORD NO.:  [id number]  LOCATION:  1441                         FACILITY:  [facility]  PHYSICIAN:  Berdia Lachman M. [place], M.D.  DATE OF BIRTH:  03/22/1932  DATE OF CONSULTATION:  07/10/2011 DATE OF DISCHARGE:                                CONSULTATION   HISTORY OF PRESENT ILLNESS:  Mr. Pidcock is a 77 year old white male with a known history of coronary artery disease who underwent a right inguinal herniorrhaphy today.  In the postoperative acute care unit, the patient was noted to have wide-complex rhythm with a rate of 60 beats per minute.  No P waves were seen.  His rhythm was transient and then return to sinus rhythm.  The patient has been asymptomatic.  He denies any chest pain, shortness of breath, palpitations, or dizziness.  His blood pressure was stable throughout.  He feels well now.  The patient has been on no rate slowing medications.  PAST MEDICAL HISTORY: 1. Coronary artery disease, status post stenting of the proximal LAD     and the first diagonal branch in 2010 with Cypher drug-eluting     stents.  Cardiac catheterization in February 2012 showed that the     stents were patent. 2. Hyperlipidemia. 3. Hypertension. 4. Diabetes mellitus, type 2. 5. GERD. 6. History of esophageal stricture.  He also has a history of, 1. Hiatal hernia. 2. Hemorrhoids. 3. Colon polyps. 4. Diverticulosis. 5. Anxiety. 6. Renal calculi. 7. Irritable bowel syndrome. 8. BPH.  ALLERGIES:  He is allergic to CRESTOR and LOVASTATIN.  MEDICATIONS PRIOR TO ADMISSION: 1. Aspirin 81 mg daily. 2. Lipitor 10 mg daily. 3. Prevacid 30 mg daily. 4. Losartan 100 mg daily. 5. Metformin 500 mg 2 tablets by mouth daily. 6. Viagra p.r.n.  PAST SURGICAL HISTORY:  None.  FAMILY HISTORY:  There is a family history of cancer.  There is no family history of coronary artery disease.  SOCIAL HISTORY:  The patient is retired.  He is a  former smoker.  He currently denies any alcohol or tobacco use.  REVIEW OF SYSTEMS:  He does have some soreness in his right groin site. All other systems were reviewed and are negative.  PHYSICAL EXAMINATION:  GENERAL:  The patient is a well-developed white male in no acute distress. VITAL SIGNS:  His blood pressure is 134/64, his pulse is 68 and irregular. HEENT:  Normocephalic, atraumatic.  Pupils are equal, round, and reactive to light accommodation.  Sclerae clear.  Oropharynx is clear. NECK:  Supple without JVD, adenopathy, thyromegaly, or bruits. LUNGS:  Clear. CARDIAC:  A regular rate and rhythm without gallop, murmur, or click. ABDOMEN:  Soft and nontender.  There are no masses or bruits. EXTREMITIES:  Femoral and pedal pulses are 2+ and symmetric.  Pedal pulses are good.  He has no edema.  There is no cyanosis. SKIN:  Warm and dry. NEUROLOGIC:  He is alert and oriented x3.  Cranial nerves II through XII are intact.  He has no focal neurologic findings.  LABORATORY DATA:  Review of his monitor demonstrates sinus rhythm with PACs and sinus arrhythmia.  His preoperative ECG was normal. Postoperative  ECG showed an idioventricular rhythm transitioning to sinus rhythm.  Cardiac enzymes are negative x1.  Magnesium is 1.9, sodium 139, potassium 4.2, chloride 104, CO2 of 28, BUN 24, creatinine 1.05, glucose is 158.  IMPRESSION: 1. Transient idioventricular rhythm.  This was asymptomatic. 2. Premature atrial contractions. 3. History of coronary disease, status post drug-eluting stents of the     LAD and diagonal branches in 2010.  Cardiac catheterization in     February of this year showed continued stent patency. 4. Diabetes mellitus, type 2. 5. Hyperlipidemia. 6. Hypertension. 7. Status post right inguinal herniorrhaphy.  PLAN:  We will observe the patient on telemetry tonight.  We will repeat cardiac enzymes in 8 hours.  We will follow up an ECG in the morning.  I would  continue with his preoperative medications.  I think it is unlikely that the patient will require further evaluation and treatment.          ______________________________ Jernee Murtaugh M. [place], M.D.     PMJ/MEDQ  D:  07/10/2011  T:  07/11/2011  Job:  [id number]  cc:   Juanetta Gosling, MD [address] [place] [address]  Veverly Fells. [name], MD [address] [place], [place] [address]  [name], MD [address] [place] [address]  Electronically Signed by Meriam Chojnowski [place] M.D. on 07/15/2011 02:15:44 PM

## 2011-07-15 NOTE — Op Note (Signed)
[name], Herrera NO.:  [id number]  MEDICAL RECORD NO.:  [id number]  LOCATION:                               FACILITY:  [facility]  PHYSICIAN:  Juanetta Gosling, MDDATE OF BIRTH:  [birth year]  DATE OF PROCEDURE:  07/10/2011 DATE OF DISCHARGE:                              OPERATIVE REPORT   PREOPERATIVE DIAGNOSIS:  Chronically incarcerated right inguinal hernia.  POSTOPERATIVE DIAGNOSIS:  Chronically incarcerated right inguinal hernia.  PROCEDURE:  Right inguinal hernia repair with UltraPro mesh patch.  SURGEON:  Juanetta Gosling, MD  ASSISTANT:  None.  ANESTHESIA:  General.  SPECIMENS:  None.  DRAINS:  None.  COMPLICATIONS:  None.  ESTIMATED BLOOD LOSS:  Minimal.  COUNTS:  Sponge and needle counts correct x2 at the end of operation.  DISPOSITION:  The patient to recovery room in stable condition.  INDICATIONS:  This is a 77 year old male with a chronically incarcerated right groin hernia.  He also had some coronary artery disease for which he has been evaluated for preoperatively.  He and I discussed repair, given his symptoms as well as the chronic incarceration of this right inguinal hernia in an open fashion.  PROCEDURE IN DETAIL:  After informed consent was obtained from the patient, he was taken to the operating room.  He was administered 1 g of intravenous cefazolin.  Sequential compression devices were placed on the lower extremities prior to induction with anesthesia.  He was then placed under general endotracheal anesthesia without complication.  His groin and scrotum were then prepped and draped in the standard sterile surgical fashion.  Surgical time-out was then performed.  I made a right groin incision and carried this down to the level of the external abdominal oblique.  This was entered sharply through the external ring.  He was noted to have a very large indirect hernia. Spermatic cord was encircled along with the hernia  sac.  This was a chronically incarcerated hernia that traveled down into his scrotum.  I eventually, with some difficulty, was able to separate the sac from the remaining cord structures.  All of these cord structures were preserved. I then opened the sac.  There were no contents at that time.  I ligated it and dunked it back into the abdomen.  I then placed an UltraPro mesh patch overlying the floor.  I secured this with 2-0 Prolene to the pubic tubercle, inguinal ligament to numerous positions.  I made a T cut through the mesh and wrapped around the spermatic cord.  I then secured it to the internal oblique superiorly.  I laid it flat behind the external oblique laterally.  This was in good position.  Hemostasis was observed.  Irrigation was performed.  I then closed this with 2-0 Vicryl for the external oblique, 3-0 Vicryl for Scarpa fascia, and 4-0 Monocryl for the skin.  Dermabond was placed over this.  I then infiltrated Exparel throughout the wound as well as performed an ilioinguinal nerve block.  Dermabond was then placed over the wound.  He tolerated this well, was extubated and transferred to the recovery room in stable condition.  He did have a new left bundle-branch block for which  we are going to get an EKG in the recovery room as well as leave him on telemetry overnight and we will get Cardiology involved as needed.     Juanetta Gosling, MD     MCW/MEDQ  D:  07/10/2011  T:  07/10/2011  Job:  [id number]  cc:   [name], MD [address] [place] [address]  Veverly Fells. [name], MD [address] [place], [place] [address]  Electronically Signed by Emelia Loron MD on 07/15/2011 08:32:37 AM

## 2011-07-17 ENCOUNTER — Other Ambulatory Visit: Payer: Self-pay

## 2011-07-17 MED ORDER — GLUCOSE BLOOD VI STRP: ORAL_STRIP | Status: DC

## 2011-07-26 NOTE — Discharge Summary (Signed)
  Keith Herrera, Keith Herrera              ACCOUNT NO.:  [id number]  MEDICAL RECORD NO.:  [id number]  LOCATION:  1441                         FACILITY:  [facility]  PHYSICIAN:  Juanetta Gosling, MDDATE OF BIRTH:  12-06-1931  DATE OF ADMISSION:  07/10/2011 DATE OF DISCHARGE:  07/11/2011                              DISCHARGE SUMMARY   ADMISSION DIAGNOSES: 1. Symptomatic chronically incarcerated right inguinal hernia. 2. Gastroesophageal reflux disease. 3. Hemorrhoids. 4. Hypertension. 5. Hyperlipidemia. 6. Type 2 diabetes. 7. Coronary artery disease.  DISCHARGE DIAGNOSES: 1. Symptomatic chronically incarcerated right inguinal hernia. 2. Gastroesophageal reflux disease. 3. Hemorrhoids. 4. Hypertension. 5. Hyperlipidemia. 6. Type 2 diabetes. 7. Coronary artery disease.  PROCEDURES PERFORMED:  July 10, 2011, right inguinal hernia repair with Ultrapro mesh patch.  HISTORY AND HOSPITAL COURSE:  This is a 77 year old male who had a chronically incarcerated right groin hernia that had begun to be symptomatic that we discussed repair.  I took him to the operative room on Apr 09, 2011 for an uneventful right inguinal hernia repair with mesh.  Postoperatively, he had an idioventricular rhythm that was treated.  He had been asymptomatic.  The cardiology saw him for and this was thought to be normal.  They followed up with him the next day and cleared him for discharge.  He was doing well and I discharged him the following day.  CONSULTS:  Cardiology.  PERTINENT LABORATORY EVALUATION:  His creatinine was 1.05.  PERTINENT RADIOLOGIC EVALUATION:  None.  DISCHARGE MEDICATIONS: 1. Lipitor. 2. Triamcinolone. 3. Psyllium. 4. Prevacid. 5. Multivitamin. 6. 81 mg aspirin. 7. Losartan 100 mg. 8. Lisinopril 30 mg. 9. Metformin 500 mg b.i.d. 10.Percocet.  FOLLOW-UP:  With [facility], Dr. Dwain Sarna in 2 or 3 weeks.     Juanetta Gosling, MD     MCW/MEDQ  D:   07/26/2011  T:  07/26/2011  Job:  [id number]  Electronically Signed by Emelia Loron MD on 07/26/2011 02:26:27 PM

## 2011-07-29 ENCOUNTER — Other Ambulatory Visit: Payer: Self-pay | Admitting: Internal Medicine

## 2011-07-31 ENCOUNTER — Encounter (INDEPENDENT_AMBULATORY_CARE_PROVIDER_SITE_OTHER): Payer: Self-pay | Admitting: General Surgery

## 2011-07-31 ENCOUNTER — Ambulatory Visit (INDEPENDENT_AMBULATORY_CARE_PROVIDER_SITE_OTHER): Payer: Medicare HMO | Admitting: General Surgery

## 2011-07-31 VITALS — BP 132/68 | HR 68

## 2011-07-31 DIAGNOSIS — Z09 Encounter for follow-up examination after completed treatment for conditions other than malignant neoplasm: Secondary | ICD-10-CM

## 2011-07-31 NOTE — Progress Notes (Signed)
Subjective:     Patient ID: Keith Herrera, male   DOB: 1932-05-10, 77 y.o.   MRN: [id number]  HPI This is a 77 year old male who had a chronically incarcerated right groin hernia. He underwent repair about 3 weeks ago. I fixed in with a ultra Pro mesh patch. He's done well and returns today without any significant complaints.  Review of Systems     Objective:   Physical Exam Well healed right groin incision, no infection or edema    Assessment:     S/p RIH repair    Plan:     Return to work Monday with restrictions for 3 more weeks RTC as needed

## 2011-08-20 LAB — GLUCOSE, CAPILLARY: Glucose-Capillary: 144 — ABNORMAL HIGH

## 2011-08-30 ENCOUNTER — Other Ambulatory Visit: Payer: Self-pay | Admitting: Internal Medicine

## 2011-11-25 ENCOUNTER — Other Ambulatory Visit: Payer: Self-pay | Admitting: Internal Medicine

## 2011-12-18 ENCOUNTER — Ambulatory Visit (INDEPENDENT_AMBULATORY_CARE_PROVIDER_SITE_OTHER): Payer: Medicare Other | Admitting: Internal Medicine

## 2011-12-18 ENCOUNTER — Other Ambulatory Visit (INDEPENDENT_AMBULATORY_CARE_PROVIDER_SITE_OTHER): Payer: Medicare Other

## 2011-12-18 ENCOUNTER — Encounter: Payer: Self-pay | Admitting: Internal Medicine

## 2011-12-18 VITALS — BP 138/82 | HR 65 | Temp 97.3°F | Ht 72.0 in | Wt 170.5 lb

## 2011-12-18 DIAGNOSIS — Z125 Encounter for screening for malignant neoplasm of prostate: Secondary | ICD-10-CM

## 2011-12-18 DIAGNOSIS — Z0000 Encounter for general adult medical examination without abnormal findings: Secondary | ICD-10-CM

## 2011-12-18 DIAGNOSIS — K4090 Unilateral inguinal hernia, without obstruction or gangrene, not specified as recurrent: Secondary | ICD-10-CM

## 2011-12-18 DIAGNOSIS — E119 Type 2 diabetes mellitus without complications: Secondary | ICD-10-CM

## 2011-12-18 DIAGNOSIS — I10 Essential (primary) hypertension: Secondary | ICD-10-CM

## 2011-12-18 LAB — BASIC METABOLIC PANEL
CO2: 28 mEq/L (ref 19–32)
Calcium: 9.3 mg/dL (ref 8.4–10.5)
Creatinine, Ser: 1 mg/dL (ref 0.4–1.5)
GFR: 80.57 mL/min (ref >60.00)
Glucose, Bld: 130 mg/dL — ABNORMAL HIGH (ref 70–99)
Sodium: 142 mEq/L (ref 135–145)

## 2011-12-18 LAB — CBC WITH DIFFERENTIAL/PLATELET
Basophils Relative: 0.4 % (ref 0.0–3.0)
HCT: 46.5 % (ref 39.0–52.0)
Hemoglobin: 16 g/dL (ref 13.0–17.0)
Lymphocytes Relative: 17.6 % (ref 12.0–46.0)
Lymphs Abs: 1.2 10*3/uL (ref 0.7–4.0)
Monocytes Relative: 7.8 % (ref 3.0–12.0)
Neutro Abs: 4.9 10*3/uL (ref 1.4–7.7)
RBC: 5.26 Mil/uL (ref 4.22–5.81)

## 2011-12-18 LAB — URINALYSIS, ROUTINE W REFLEX MICROSCOPIC
Ketones, ur: NEGATIVE
Specific Gravity, Urine: 1.02 (ref 1.000–1.030)
Total Protein, Urine: NEGATIVE
Urine Glucose: NEGATIVE
pH: 6 (ref 5.0–8.0)

## 2011-12-18 LAB — LIPID PANEL
HDL: 34.6 mg/dL — ABNORMAL LOW (ref >39.00)
Total CHOL/HDL Ratio: 5
Triglycerides: 122 mg/dL (ref 0.0–149.0)
VLDL: 24.4 mg/dL (ref 0.0–40.0)

## 2011-12-18 LAB — MICROALBUMIN / CREATININE URINE RATIO: Microalb Creat Ratio: 1.3 mg/g (ref 0.0–30.0)

## 2011-12-18 LAB — HEPATIC FUNCTION PANEL
Albumin: 3.9 g/dL (ref 3.5–5.2)
Alkaline Phosphatase: 50 U/L (ref 39–117)
Bilirubin, Direct: 0.1 mg/dL (ref 0.0–0.3)
Total Protein: 6.8 g/dL (ref 6.0–8.3)

## 2011-12-18 LAB — PSA: PSA: 2.37 ng/mL (ref 0.10–4.00)

## 2011-12-18 NOTE — Assessment & Plan Note (Signed)

## 2011-12-18 NOTE — Assessment & Plan Note (Signed)
?   Control, ok by cbg but recent a1c have been increaseing, for labs today, Continue all other medications as before Lab Results  Component Value Date   HGBA1C 7.7* 07/01/2011

## 2011-12-18 NOTE — Assessment & Plan Note (Signed)
stable overall by hx and exam, most recent data reviewed with pt, and pt to continue medical treatment as before  BP Readings from Last 3 Encounters:  12/18/11 138/82  07/31/11 132/68  07/01/11 138/70

## 2011-12-18 NOTE — Assessment & Plan Note (Signed)
S/p recent RIH, stable

## 2011-12-18 NOTE — Patient Instructions (Signed)
Continue all other medications as before Please go to LAB in the Basement for the blood and/or urine tests to be done today Please call the phone number [phone] (the PhoneTree System) for results of testing in 2-3 days;  When calling, simply dial the number, and when prompted enter the MRN number above (the Medical Record Number) and the # key, then the message should start. Your forms were filled out today You are otherwise up to date with prevention today Please return in 6 mo with Lab testing done 3-5 days before

## 2011-12-18 NOTE — Progress Notes (Signed)
Subjective:    Patient ID: Keith Herrera, male    DOB: 1932-10-29, 78 y.o.   MRN: [id number]  HPI  Here for wellness and f/u;  Overall doing ok;  Pt denies CP, worsening SOB, DOE, wheezing, orthopnea, PND, worsening LE edema, palpitations, dizziness or syncope.  Pt denies neurological change such as new Headache, facial or extremity weakness.  Pt denies polydipsia, polyuria, or low sugar symptoms. Pt states overall good compliance with treatment and medications, good tolerability, and trying to follow lower cholesterol diet.  Pt denies worsening depressive symptoms, suicidal ideation or panic. No fever, wt loss, night sweats, loss of appetite, or other constitutional symptoms.  Pt states good ability with ADL's, low fall risk, home safety reviewed and adequate, no significant changes in hearing or vision, and occasionally active with exercise.  Remians very active for age with most days of the week is active with yardwork such as splitting wood and cutting trees, working on tractors.  CBg's in the low 100's,  Recent a1c 7.7 - 5 mo ago.  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  Is not taking the amlodipine 10 mg, and is confused that he was might supposed to be taking this; Overall good compliance with treatment, and good medicine tolerability. Past Medical History  Diagnosis Date  . GERD (gastroesophageal reflux disease)   . Hiatal hernia   . Hemorrhoids   . Adenomatous polyp of colon 05/2004  . Diverticulosis     colon  . Hypertension   . Hyperlipidemia   . Diabetes type 2, controlled   . Anxiety   . CAD (coronary artery disease)   . Right inguinal hernia   . Esophageal stricture   . Personal history of urinary calculi   . ED (erectile dysfunction)   . Allergic rhinitis   . IBS (irritable bowel syndrome)   . BPH (benign prostatic hyperplasia)   . Other nonspecific abnormal cardiovascular system function study   .  Pain in limb     left leg  . Back pain   . Routine general medical examination at a health care facility   . Preoperative examination, unspecified   . Special screening for malignant neoplasm of prostate   . Rash and other nonspecific skin eruption   . Urinary frequency    Past Surgical History  Procedure Date  . Coronary stent placement 10/2008    x 2  . Hernia repair     RIH with ultrapro patch    reports that he has quit smoking. He does not have any smokeless tobacco history on file. He reports that he does not drink alcohol or use illicit drugs. family history includes Cancer (age of onset:79) in his mother and Cancer (age of onset:80) in his sister. Allergies  Allergen Reactions  . Crestor (Rosuvastatin Calcium) Rash    All over body  . Lovastatin     REACTION: rash   Current Outpatient Prescriptions on File Prior to Visit  Medication Sig Dispense Refill  . aspirin 81 MG tablet Take 81 mg by mouth daily.        Marland Kitchen atorvastatin (LIPITOR) 10 MG tablet Take 10 mg by mouth daily.        Marland Kitchen glucose blood test strip Use as directed two times daily. 250.00  100 each  12  . lansoprazole (PREVACID) 30 MG capsule TAKE 1 CAPSULE EVERY DAY  90 capsule  2  . losartan (  COZAAR) 100 MG tablet TAKE 1 TABLET EVERY DAY  90 tablet  3  . metFORMIN (GLUCOPHAGE) 500 MG tablet TAKE 2 TABLETS BY MOUTH EVERY DAY  60 tablet  10  . ONE TOUCH LANCETS MISC by Does not apply route 2 (two) times daily as needed.        Marland Kitchen VIAGRA 100 MG tablet USE AS DIRECTED  5 tablet  6  . amLODipine (NORVASC) 10 MG tablet Take 10 mg by mouth daily.        Marland Kitchen triamcinolone (KENALOG) 0.1 % cream Apply 1 application topically as needed.        Review of Systems Review of Systems  Constitutional: Negative for diaphoresis, activity change, appetite change and unexpected weight change.  HENT: Negative for hearing loss, ear pain, facial swelling, mouth sores and neck stiffness.   Eyes: Negative for pain, redness and visual  disturbance.  Respiratory: Negative for shortness of breath and wheezing.   Cardiovascular: Negative for chest pain and palpitations.  Gastrointestinal: Negative for diarrhea, blood in stool, abdominal distention and rectal pain.  Genitourinary: Negative for hematuria, flank pain and decreased urine volume.  Musculoskeletal: Negative for myalgias and joint swelling.  Skin: Negative for color change and wound.  Neurological: Negative for syncope and numbness.  Hematological: Negative for adenopathy.  Psychiatric/Behavioral: Negative for hallucinations, self-injury, decreased concentration and agitation.      Objective:   Physical Exam BP 138/82  Pulse 65  Temp(Src) 97.3 F (36.3 C) (Oral)  Ht 6' (1.829 m)  Wt 170 lb 8 oz (77.338 kg)  BMI 23.12 kg/m2  SpO2 98% Physical Exam  VS noted Constitutional: Pt is oriented to person, place, and time. Appears well-developed and well-nourished.  HENT:  Head: Normocephalic and atraumatic.  Right Ear: External ear normal.  Left Ear: External ear normal.  Nose: Nose normal.  Mouth/Throat: Oropharynx is clear and moist.  Eyes: Conjunctivae and EOM are normal. Pupils are equal, round, and reactive to light.  Neck: Normal range of motion. Neck supple. No JVD present. No tracheal deviation present.  Cardiovascular: Normal rate, regular rhythm, normal heart sounds and intact distal pulses.   Pulmonary/Chest: Effort normal and breath sounds normal.  Abdominal: Soft. Bowel sounds are normal. There is no tenderness.  Musculoskeletal: Normal range of motion. Exhibits no edema.  Lymphadenopathy:  Has no cervical adenopathy.  Neurological: Pt is alert and oriented to person, place, and time. Pt has normal reflexes. No cranial nerve deficit.  Skin: Skin is warm and dry. No rash noted.  Psychiatric:  Has  normal mood and affect. Behavior is normal.     Assessment & Plan:

## 2012-01-15 ENCOUNTER — Ambulatory Visit (INDEPENDENT_AMBULATORY_CARE_PROVIDER_SITE_OTHER): Payer: Medicare Other | Admitting: Physician Assistant

## 2012-01-15 ENCOUNTER — Encounter: Payer: Self-pay | Admitting: Physician Assistant

## 2012-01-15 VITALS — BP 132/68 | HR 61 | Ht 71.0 in | Wt 172.0 lb

## 2012-01-15 DIAGNOSIS — J329 Chronic sinusitis, unspecified: Secondary | ICD-10-CM

## 2012-01-15 DIAGNOSIS — I2510 Atherosclerotic heart disease of native coronary artery without angina pectoris: Secondary | ICD-10-CM

## 2012-01-15 DIAGNOSIS — I10 Essential (primary) hypertension: Secondary | ICD-10-CM

## 2012-01-15 DIAGNOSIS — R0989 Other specified symptoms and signs involving the circulatory and respiratory systems: Secondary | ICD-10-CM

## 2012-01-15 DIAGNOSIS — R002 Palpitations: Secondary | ICD-10-CM

## 2012-01-15 LAB — CBC WITH DIFFERENTIAL/PLATELET
Eosinophils Relative: 2.6 % (ref 0.0–5.0)
HCT: 50.2 % (ref 39.0–52.0)
Hemoglobin: 16.6 g/dL (ref 13.0–17.0)
Lymphs Abs: 1.5 10*3/uL (ref 0.7–4.0)
Monocytes Relative: 8.8 % (ref 3.0–12.0)
Platelets: 171 10*3/uL (ref 150.0–400.0)
WBC: 6.9 10*3/uL (ref 4.5–10.5)

## 2012-01-15 LAB — BASIC METABOLIC PANEL
CO2: 27 mEq/L (ref 19–32)
Calcium: 9.5 mg/dL (ref 8.4–10.5)
Chloride: 108 mEq/L (ref 96–112)
Glucose, Bld: 115 mg/dL — ABNORMAL HIGH (ref 70–99)
Sodium: 141 mEq/L (ref 135–145)

## 2012-01-15 MED ORDER — AZITHROMYCIN 250 MG PO TABS: ORAL_TABLET | ORAL | Status: AC

## 2012-01-15 NOTE — Assessment & Plan Note (Signed)
No angina.  Continue aspirin and statin. 

## 2012-01-15 NOTE — Progress Notes (Signed)
[address] [place], [place]  [address] Phone: [phone] Fax:  [phone]  Date:  01/15/2012   Name:  Keith Herrera       DOB:  05/11/1932 MRN:  [id number]  PCP:  Dr. Oliver Barre Primary Cardiologist:  Dr. [name]  Primary Electrophysiologist:  None    History of Present Illness: Keith Herrera is a 78 y.o. male who presents for palpitations.  He has a history of CAD, status post stenting of the LAD and proximal diagonal and 2010, hypertension, hyperlipidemia, diabetes, GERD/esophageal stricture, BPH, IBS.  He underwent an exercise treadmill test by me in 6/12 in preparation for a right inguinal hernia repair.  This was abnormal and LHC was done 6/12: EF 65%, circumflex patent, mid RCA 80-90% (small and nondominant), LAD and diagonal stents patent, LAD at the origin of the first diagonal 30%, ostial D2 90%, mid 80-90% (small vessel).  Continued medical therapy was recommended.  He underwent his hernia repair without cardiovascular complications.  Over the last week, he notes palpitations mainly at rest.  He denies any exertional problems.  He hears it and also feels it.  He describes a whooshing noise.  He gets dizzy when he stands up.  He feels lightheaded.  There is no spinning.  He denies chest pain or shortness of breath.  He denies orthopnea, PND or edema.  He denies syncope.  He's had sinus congestion for the past month.  He's been using Afrin.  He has not changed any of his medications.  He is not taking any over-the-counter stimulants.  Past Medical History  Diagnosis Date  . GERD (gastroesophageal reflux disease)   . Hiatal hernia   . Hemorrhoids   . Adenomatous polyp of colon 05/2004  . Diverticulosis     colon  . Hypertension   . Hyperlipidemia   . Diabetes type 2, controlled   . Anxiety   . CAD (coronary artery disease)     s/p Cypher DES to LAD and pD1 2010;  LHC was done 6/12: EF 65%, circumflex patent, mid RCA 80-90% (small  and nondominant), LAD and diagonal stents patent, LAD at the origin of the first diagonal 30%, ostial D2 90%, mid 80-90% (small vessel).  Continued medical therapy was recommended.   . Right inguinal hernia     s/p repair in 8/12  . Esophageal stricture   . Nephrolithiasis   . ED (erectile dysfunction)   . Allergic rhinitis   . IBS (irritable bowel syndrome)   . BPH (benign prostatic hyperplasia)     Current Outpatient Prescriptions  Medication Sig Dispense Refill  . aspirin 81 MG tablet Take 81 mg by mouth daily.        Marland Kitchen atorvastatin (LIPITOR) 10 MG tablet Take 10 mg by mouth daily.        Marland Kitchen glucose blood test strip Use as directed two times daily. 250.00  100 each  12  . Ibuprofen (ADVIL) 200 MG CAPS Take by mouth as needed.      . lansoprazole (PREVACID) 30 MG capsule TAKE 1 CAPSULE EVERY DAY  90 capsule  2  . losartan (COZAAR) 100 MG tablet TAKE 1 TABLET EVERY DAY  90 tablet  3  . metFORMIN (GLUCOPHAGE) 500 MG tablet TAKE 2 TABLETS BY MOUTH EVERY DAY  60 tablet  10  . ONE TOUCH LANCETS MISC by Does not apply route 2 (two) times daily as needed.        Marland Kitchen  VIAGRA 100 MG tablet USE AS DIRECTED  5 tablet  6    Allergies: Allergies  Allergen Reactions  . Crestor (Rosuvastatin Calcium) Rash    All over body  . Lovastatin     REACTION: rash    History  Substance Use Topics  . Smoking status: Former Games developer  . Smokeless tobacco: Not on file  . Alcohol Use: No     ROS:  Please see the history of present illness.   He denies fevers, chills, cough, melena, hematochezia, TIA symptoms.  All other systems reviewed and negative.   PHYSICAL EXAM: VS:  BP 132/68  Pulse 61  Ht 5\' 11"  (1.803 m)  Wt 172 lb (78.019 kg)  BMI 23.99 kg/m2 Blood pressure lying 150/80, standing 140/80  Well nourished, well developed, in no acute distress HEENT: normal Neck: no JVD Vascular: Questionable left carotid bruit Endocrine: No thyromegaly Cardiac:  normal S1, S2; RRR; no murmur Lungs:  clear  to auscultation bilaterally, no wheezing, rhonchi or rales Abd: soft, nontender, no hepatomegaly Ext: no edema Skin: warm and dry Neuro:  CNs 2-12 intact, no focal abnormalities noted  EKG:  Sinus rhythm, heart rate 61, normal axis, nonspecific ST-T wave changes, no change from prior tracing  ASSESSMENT AND PLAN:

## 2012-01-15 NOTE — Assessment & Plan Note (Signed)
With his palpitations, he hears a noise.  Obtain carotid Dopplers.

## 2012-01-15 NOTE — Patient Instructions (Signed)
Your physician recommends that you schedule a follow-up appointment in: 1 month with Dr [name] Your physician recommends that you have lab work drawn today (BMP and CBC)  Your physician has requested that you have an echocardiogram. Echocardiography is a painless test that uses sound waves to create images of your heart. It provides your doctor with information about the size and shape of your heart and how well your heart's chambers and valves are working. This procedure takes approximately one hour. There are no restrictions for this procedure.  Your physician has requested that you have a carotid duplex. This test is an ultrasound of the carotid arteries in your neck. It looks at blood flow through these arteries that supply the brain with blood. Allow one hour for this exam. There are no restrictions or special instructions.  Your physician has recommended that you wear an event monitor. Event monitors are medical devices that record the heart's electrical activity. Doctors most often [place] these monitors to diagnose arrhythmias. Arrhythmias are problems with the speed or rhythm of the heartbeat. The monitor is a small, portable device. You can wear one while you do your normal daily activities. This is usually used to diagnose what is causing palpitations/syncope (passing out).  Your physician has recommended you make the following change in your medication: START Z-pak today as directed --- take 2 tabs day 1 then take 1 tab daily for 4 days

## 2012-01-15 NOTE — Assessment & Plan Note (Signed)
Blood pressure elevated today.  Continue to monitor.

## 2012-01-15 NOTE — Assessment & Plan Note (Signed)
He has had sinus congestion for over a month.  I believe his lightheadedness is related to this.  I will place him on a Z-Pak.  I warned him to limit his use of Afrin.  I also warned him to not use medications like Sudafed.

## 2012-01-15 NOTE — Assessment & Plan Note (Signed)
He is concerned he may have atrial fibrillation.  He is in sinus rhythm today.  Recent TSH was normal.  Check an echocardiogram.  Arrange a 21 day event monitor.  Followup with Dr. [name] in one month.

## 2012-01-28 ENCOUNTER — Other Ambulatory Visit (HOSPITAL_COMMUNITY): Payer: Medicare Other

## 2012-02-04 ENCOUNTER — Other Ambulatory Visit: Payer: Self-pay

## 2012-02-04 ENCOUNTER — Telehealth: Payer: Self-pay | Admitting: *Deleted

## 2012-02-04 ENCOUNTER — Encounter (INDEPENDENT_AMBULATORY_CARE_PROVIDER_SITE_OTHER): Payer: Medicare Other

## 2012-02-04 ENCOUNTER — Ambulatory Visit (HOSPITAL_COMMUNITY): Payer: Medicare Other | Attending: Cardiology

## 2012-02-04 DIAGNOSIS — I10 Essential (primary) hypertension: Secondary | ICD-10-CM | POA: Insufficient documentation

## 2012-02-04 DIAGNOSIS — E785 Hyperlipidemia, unspecified: Secondary | ICD-10-CM | POA: Insufficient documentation

## 2012-02-04 DIAGNOSIS — I2510 Atherosclerotic heart disease of native coronary artery without angina pectoris: Secondary | ICD-10-CM

## 2012-02-04 DIAGNOSIS — E119 Type 2 diabetes mellitus without complications: Secondary | ICD-10-CM | POA: Insufficient documentation

## 2012-02-04 DIAGNOSIS — R002 Palpitations: Secondary | ICD-10-CM

## 2012-02-04 DIAGNOSIS — I369 Nonrheumatic tricuspid valve disorder, unspecified: Secondary | ICD-10-CM

## 2012-02-04 NOTE — Telephone Encounter (Signed)
Message copied by Tarri Fuller on Tue Feb 04, 2012  5:27 PM ------      Message from: [place], [place] T      Created: Tue Feb 04, 2012  4:54 PM       Normal LV function      Tereso Newcomer, [place]  4:54 PM 02/04/2012

## 2012-02-04 NOTE — Telephone Encounter (Signed)
wife aware of echo results today. Keith Herrera

## 2012-02-17 ENCOUNTER — Encounter (INDEPENDENT_AMBULATORY_CARE_PROVIDER_SITE_OTHER): Payer: Medicare Other

## 2012-02-17 ENCOUNTER — Ambulatory Visit: Payer: Medicare Other | Admitting: Cardiovascular Disease

## 2012-02-17 DIAGNOSIS — R42 Dizziness and giddiness: Secondary | ICD-10-CM

## 2012-02-17 DIAGNOSIS — R0989 Other specified symptoms and signs involving the circulatory and respiratory systems: Secondary | ICD-10-CM

## 2012-02-17 DIAGNOSIS — I6529 Occlusion and stenosis of unspecified carotid artery: Secondary | ICD-10-CM

## 2012-02-19 ENCOUNTER — Other Ambulatory Visit: Payer: Self-pay | Admitting: Internal Medicine

## 2012-02-22 ENCOUNTER — Encounter: Payer: Self-pay | Admitting: Physician Assistant

## 2012-02-24 ENCOUNTER — Telehealth: Payer: Self-pay | Admitting: *Deleted

## 2012-02-24 ENCOUNTER — Other Ambulatory Visit: Payer: Self-pay | Admitting: *Deleted

## 2012-02-24 DIAGNOSIS — R0989 Other specified symptoms and signs involving the circulatory and respiratory systems: Secondary | ICD-10-CM

## 2012-02-24 NOTE — Telephone Encounter (Signed)
Message copied by Tarri Fuller on Mon Feb 24, 2012  9:35 AM ------      Message from: [place], [place] T      Created: Sat Feb 22, 2012  6:36 AM       0-39% RICA      40-59% LICA      Arrange follow up u/s in one year      Continue ASA and Lipitor      Tereso Newcomer, PA-C  6:35 AM 02/22/2012

## 2012-02-24 NOTE — Telephone Encounter (Signed)
pt notified of carotid duplex results and gave verbal understanding, will repeat in 1 yr. Danielle Rankin  CAROTID ORDER IN COMPUTER TO BE DONE IN 1 YR

## 2012-03-23 ENCOUNTER — Other Ambulatory Visit: Payer: Self-pay | Admitting: Internal Medicine

## 2012-04-17 ENCOUNTER — Ambulatory Visit: Payer: Medicare Other | Admitting: Cardiovascular Disease

## 2012-05-06 ENCOUNTER — Other Ambulatory Visit: Payer: Self-pay | Admitting: *Deleted

## 2012-05-06 MED ORDER — METFORMIN HCL 500 MG PO TABS: ORAL_TABLET | ORAL | Status: DC

## 2012-05-06 NOTE — Telephone Encounter (Signed)
R"cd fax from [facility] in [place] for refill of Metformin for 90 day supply.

## 2012-05-28 ENCOUNTER — Telehealth: Payer: Self-pay | Admitting: Gastroenterology

## 2012-05-28 NOTE — Telephone Encounter (Signed)
Left message for patient to call back  

## 2012-05-29 NOTE — Telephone Encounter (Signed)
Patient is scheduled for 05/26/12.  He is out of town and wanted to set up a colonoscopy.  He needs an office visit.  I have scheduled an appt for 06/26/12

## 2012-06-08 ENCOUNTER — Encounter: Payer: Self-pay | Admitting: Cardiovascular Disease

## 2012-06-08 ENCOUNTER — Ambulatory Visit (INDEPENDENT_AMBULATORY_CARE_PROVIDER_SITE_OTHER): Payer: Medicare Other | Admitting: Cardiovascular Disease

## 2012-06-08 VITALS — BP 150/80 | HR 66 | Ht 70.0 in | Wt 167.0 lb

## 2012-06-08 DIAGNOSIS — I10 Essential (primary) hypertension: Secondary | ICD-10-CM

## 2012-06-08 DIAGNOSIS — I2510 Atherosclerotic heart disease of native coronary artery without angina pectoris: Secondary | ICD-10-CM

## 2012-06-08 NOTE — Progress Notes (Signed)
   HPI:  78 year old gentleman presenting for followup evaluation. The patient has coronary artery disease and has undergone stenting of the LAD and diagonal branches and 2010. He is also followed for hypertension, hyperlipidemia, and palpitations. His most recent cardiac catheterization was in 2012 and demonstrated patency of his LAD and diagonal stents. He has some small vessel disease involving his diagonal branches and a nondominant RCA. He's done well with medical therapy. He was seen several months ago for palpitations and these symptoms have completely resolved. He denies chest pain or pressure. He continues to drive atorvastatin. He has no complaints at the present time.  Outpatient Encounter Prescriptions as of 06/08/2012  Medication Sig Dispense Refill  . aspirin 81 MG tablet Take 81 mg by mouth daily.        Marland Kitchen atorvastatin (LIPITOR) 10 MG tablet TAKE 1 TABLET EVERY DAY  90 tablet  3  . glucose blood test strip Use as directed two times daily. 250.00  100 each  12  . Ibuprofen (ADVIL) 200 MG CAPS Take by mouth as needed.      . lansoprazole (PREVACID) 30 MG capsule TAKE 1 CAPSULE EVERY DAY  90 capsule  2  . losartan (COZAAR) 100 MG tablet TAKE 1 TABLET EVERY DAY  90 tablet  3  . metFORMIN (GLUCOPHAGE) 500 MG tablet TAKE 2 TABLETS BY MOUTH EVERY DAY  180 tablet  2  . ONE TOUCH LANCETS MISC by Does not apply route 2 (two) times daily as needed.        Marland Kitchen VIAGRA 100 MG tablet USE AS DIRECTED  5 tablet  6    Allergies  Allergen Reactions  . Crestor (Rosuvastatin Calcium) Rash    All over body  . Lovastatin     REACTION: rash    Past Medical History  Diagnosis Date  . GERD (gastroesophageal reflux disease)   . Hiatal hernia   . Hemorrhoids   . Adenomatous polyp of colon 05/2004  . Diverticulosis     colon  . Hypertension   . Hyperlipidemia   . Diabetes type 2, controlled   . Anxiety   . CAD (coronary artery disease)     s/p Cypher DES to LAD and pD1 2010;  LHC was done 6/12: EF  65%, circumflex patent, mid RCA 80-90% (small and nondominant), LAD and diagonal stents patent, LAD at the origin of the first diagonal 30%, ostial D2 90%, mid 80-90% (small vessel).  Continued medical therapy was recommended.   . Right inguinal hernia     s/p repair in 8/12  . Esophageal stricture   . Nephrolithiasis   . ED (erectile dysfunction)   . Allergic rhinitis   . IBS (irritable bowel syndrome)   . BPH (benign prostatic hyperplasia)   . Carotid stenosis     dopplers 02/2012: 0-39% RICA; 40-59% LICA    ROS: Negative except as per HPI  BP 150/80  Pulse 66  Ht 5\' 10"  (1.778 m)  Wt 75.751 kg (167 lb)  BMI 23.96 kg/m2  PHYSICAL EXAM: Pt is alert and oriented, NAD HEENT: normal Neck: JVP - normal, carotids 2+= with a brief right carotid bruit Lungs: CTA bilaterally CV: RRR without murmur or gallop Abd: soft, NT, Positive BS, no hepatomegaly Ext: no C/C/E, distal pulses intact and equal Skin: warm/dry no rash  EKG:  Normal sinus rhythm 66 beats per minute, nonspecific T wave abnormality.  ASSESSMENT AND PLAN:

## 2012-06-08 NOTE — Patient Instructions (Addendum)
Your physician wants you to follow-up in:  12 months.  You will receive a reminder letter in the mail two months in advance. If you don't receive a letter, please call our office to schedule the follow-up appointment.   

## 2012-06-15 ENCOUNTER — Ambulatory Visit: Payer: Medicare Other | Admitting: Internal Medicine

## 2012-06-15 ENCOUNTER — Telehealth: Payer: Self-pay

## 2012-06-15 NOTE — Telephone Encounter (Signed)
Spouse called requesting labs for pt prior to app, Spouse advised that lab orders previously placed.

## 2012-06-16 ENCOUNTER — Encounter: Payer: Self-pay | Admitting: Cardiovascular Disease

## 2012-06-16 ENCOUNTER — Encounter: Payer: Self-pay | Admitting: Internal Medicine

## 2012-06-16 ENCOUNTER — Other Ambulatory Visit (INDEPENDENT_AMBULATORY_CARE_PROVIDER_SITE_OTHER): Payer: Medicare Other

## 2012-06-16 ENCOUNTER — Ambulatory Visit (INDEPENDENT_AMBULATORY_CARE_PROVIDER_SITE_OTHER): Payer: Medicare Other | Admitting: Internal Medicine

## 2012-06-16 VITALS — BP 142/68 | HR 64 | Temp 97.4°F | Ht 71.0 in | Wt 166.1 lb

## 2012-06-16 DIAGNOSIS — E119 Type 2 diabetes mellitus without complications: Secondary | ICD-10-CM

## 2012-06-16 DIAGNOSIS — I10 Essential (primary) hypertension: Secondary | ICD-10-CM

## 2012-06-16 DIAGNOSIS — Z0000 Encounter for general adult medical examination without abnormal findings: Secondary | ICD-10-CM

## 2012-06-16 DIAGNOSIS — E785 Hyperlipidemia, unspecified: Secondary | ICD-10-CM

## 2012-06-16 LAB — BASIC METABOLIC PANEL
Calcium: 9.5 mg/dL (ref 8.4–10.5)
Chloride: 106 mEq/L (ref 96–112)
Creatinine, Ser: 1.1 mg/dL (ref 0.4–1.5)
GFR: 66.66 mL/min (ref >60.00)

## 2012-06-16 LAB — LIPID PANEL
LDL Cholesterol: 74 mg/dL (ref 0–99)
Total CHOL/HDL Ratio: 4
Triglycerides: 122 mg/dL (ref 0.0–149.0)
VLDL: 24.4 mg/dL (ref 0.0–40.0)

## 2012-06-16 MED ORDER — SILDENAFIL CITRATE 100 MG PO TABS: 100.0000 mg | ORAL_TABLET | ORAL | Status: DC | PRN

## 2012-06-16 NOTE — Assessment & Plan Note (Signed)
Stable without angina. We have to keep in mind that he had silent ischemia and initial presentation. However, he's had fairly recent cardiac catheterization just one year ago demonstrating continued stent patency. We'll continue current medical program.

## 2012-06-16 NOTE — Assessment & Plan Note (Signed)
Office blood pressure is elevated, and the patient has history of white coat hypertension. He reports good readings at home. We'll continue same medicines.

## 2012-06-16 NOTE — Progress Notes (Signed)
Subjective:    Patient ID: Keith Herrera, male    DOB: [birth year], 78 y.o.   MRN: [id number]  HPI  Here to f/u; overall doing ok,  Pt denies chest pain, increased sob or doe, wheezing, orthopnea, PND, increased LE swelling, palpitations, dizziness or syncope.  Pt denies new neurological symptoms such as new headache, or facial or extremity weakness or numbness   Pt denies polydipsia, polyuria, or low sugar symptoms such as weakness or confusion improved with po intake.  Pt states overall good compliance with meds, trying to follow lower cholesterol, diabetic diet, wt overall stable but little exercise however. Admits to some statin noncompliacne with labs done jan 2013, but has taken consistently in past 3 months.  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   Pt denies fever, wt loss, night sweats, loss of appetite, or other constitutional symptoms  Denies worsening depressive symptoms, suicidal ideation, or panic.  Asks for viagra refill, not on nitrate. Past Medical History  Diagnosis Date  . GERD (gastroesophageal reflux disease)   . Hiatal hernia   . Hemorrhoids   . Adenomatous polyp of colon 05/2004  . Diverticulosis     colon  . Hypertension   . Hyperlipidemia   . Diabetes type 2, controlled   . Anxiety   . CAD (coronary artery disease)     s/p Cypher DES to LAD and pD1 2010;  LHC was done 6/12: EF 65%, circumflex patent, mid RCA 80-90% (small and nondominant), LAD and diagonal stents patent, LAD at the origin of the first diagonal 30%, ostial D2 90%, mid 80-90% (small vessel).  Continued medical therapy was recommended.   . Right inguinal hernia     s/p repair in 8/12  . Esophageal stricture   . Nephrolithiasis   . ED (erectile dysfunction)   . Allergic rhinitis   . IBS (irritable bowel syndrome)   . BPH (benign prostatic hyperplasia)   . Carotid stenosis     dopplers 02/2012: 0-39% RICA; 40-59% LICA   Past Surgical History  Procedure Date   . Coronary stent placement 10/2008    x 2  . Hernia repair     RIH with ultrapro patch    reports that he has quit smoking. He does not have any smokeless tobacco history on file. He reports that he does not drink alcohol or use illicit drugs. family history includes Cancer (age of onset:79) in his mother and Cancer (age of onset:80) in his sister. Allergies  Allergen Reactions  . Crestor (Rosuvastatin Calcium) Rash    All over body  . Lovastatin     REACTION: rash   Current Outpatient Prescriptions on File Prior to Visit  Medication Sig Dispense Refill  . aspirin 81 MG tablet Take 81 mg by mouth daily.        Marland Kitchen atorvastatin (LIPITOR) 10 MG tablet TAKE 1 TABLET EVERY DAY  90 tablet  3  . glucose blood test strip Use as directed two times daily. 250.00  100 each  12  . Ibuprofen (ADVIL) 200 MG CAPS Take by mouth as needed.      . lansoprazole (PREVACID) 30 MG capsule TAKE 1 CAPSULE EVERY DAY  90 capsule  2  . losartan (COZAAR) 100 MG tablet TAKE 1 TABLET EVERY DAY  90 tablet  3  . metFORMIN (GLUCOPHAGE) 500 MG tablet TAKE 2 TABLETS BY MOUTH EVERY DAY  180 tablet  2  . ONE TOUCH LANCETS MISC  by Does not apply route 2 (two) times daily as needed.        Marland Kitchen DISCONTD: VIAGRA 100 MG tablet USE AS DIRECTED  5 tablet  6   Review of Systems Constitutional: Negative for diaphoresis and unexpected weight change.  HENT: Negative for drooling and tinnitus.   Eyes: Negative for photophobia and visual disturbance.  Respiratory: Negative for choking and stridor.   Gastrointestinal: Negative for vomiting and blood in stool.  Genitourinary: Negative for hematuria and decreased urine volume.  Musculoskeletal: Negative for gait problem.  Skin: Negative for color change and wound.  Neurological: Negative for tremors and numbness.  Psychiatric/Behavioral: Negative for decreased concentration. The patient is not hyperactive.      Objective:   Physical Exam BP 142/68  Pulse 64  Temp 97.4 F (36.3  C) (Oral)  Ht 5\' 11"  (1.803 m)  Wt 166 lb 2 oz (75.354 kg)  BMI 23.17 kg/m2  SpO2 98% Physical Exam  VS noted, not ill appearing Constitutional: Pt appears well-developed and well-nourished.  HENT: Head: Normocephalic.  Right Ear: External ear normal.  Left Ear: External ear normal.  Eyes: Conjunctivae and EOM are normal. Pupils are equal, round, and reactive to light.  Neck: Normal range of motion. Neck supple.  Cardiovascular: Normal rate and regular rhythm.   Pulmonary/Chest: Effort normal and breath sounds normal.  Abd:  Soft, NT, non-distended, + BS Neurological: Pt is alert. Not confused.  Skin: Skin is warm. No erythema. No LE edema Psychiatric: Pt behavior is normal. Thought content normal.     Assessment & Plan:

## 2012-06-16 NOTE — Assessment & Plan Note (Signed)
stable overall by hx and exam, most recent data reviewed with pt, and pt to continue medical treatment as before Lab Results  Component Value Date   HGBA1C 6.8* 06/16/2012

## 2012-06-16 NOTE — Assessment & Plan Note (Signed)
stable overall by hx and exam, most recent data reviewed with pt, and pt to continue medical treatment as before Lab Results  Component Value Date   LDLCALC 74 06/16/2012   To check lab, goal ldl < 70

## 2012-06-16 NOTE — Patient Instructions (Addendum)
We will try to let you know about your blood work results from today  Continue all other medications as before Please have the pharmacy call with any refills you may need. Please continue your efforts at being more active, low cholesterol diet, and weight control. You should be due for your follow up colonscopy about Nov 2014 Please return in 6 mo with Lab testing done 3-5 days before

## 2012-06-16 NOTE — Assessment & Plan Note (Signed)
stable overall by hx and exam, most recent data reviewed with pt, and pt to continue medical treatment as before BP Readings from Last 3 Encounters:  06/16/12 142/68  06/08/12 150/80  01/15/12 132/68

## 2012-06-26 ENCOUNTER — Ambulatory Visit: Payer: Medicare Other | Admitting: Gastroenterology

## 2012-07-22 ENCOUNTER — Telehealth: Payer: Self-pay | Admitting: Gastroenterology

## 2012-07-22 NOTE — Telephone Encounter (Signed)
Message copied by Arna Snipe on Wed Jul 22, 2012 10:36 AM ------      Message from: Jessee Avers      Created: Fri Jun 26, 2012  3:04 PM      Regarding: RE: Cx appt today 06-26-12 2:45pm        Bill patient per Dr. Russella Dar      ----- Message -----         From: Arna Snipe         Sent: 06/26/2012  11:32 AM           To: Jessee Avers, CMA      Subject: Cx appt today 06-26-12 2:45pm                              Spoke to spouse & she said it was a fam emergency

## 2012-08-11 ENCOUNTER — Other Ambulatory Visit: Payer: Self-pay | Admitting: Internal Medicine

## 2012-12-17 ENCOUNTER — Ambulatory Visit: Payer: Medicare Other | Admitting: Internal Medicine

## 2012-12-21 ENCOUNTER — Encounter: Payer: Self-pay | Admitting: Internal Medicine

## 2012-12-21 ENCOUNTER — Ambulatory Visit (INDEPENDENT_AMBULATORY_CARE_PROVIDER_SITE_OTHER): Payer: Medicare Other | Admitting: Internal Medicine

## 2012-12-21 ENCOUNTER — Other Ambulatory Visit (INDEPENDENT_AMBULATORY_CARE_PROVIDER_SITE_OTHER): Payer: Medicare Other

## 2012-12-21 VITALS — BP 128/80 | HR 60 | Temp 97.8°F | Ht 72.0 in | Wt 172.5 lb

## 2012-12-21 DIAGNOSIS — E119 Type 2 diabetes mellitus without complications: Secondary | ICD-10-CM

## 2012-12-21 DIAGNOSIS — Z0000 Encounter for general adult medical examination without abnormal findings: Secondary | ICD-10-CM

## 2012-12-21 LAB — BASIC METABOLIC PANEL
BUN: 24 mg/dL — ABNORMAL HIGH (ref 6–23)
Creatinine, Ser: 1.1 mg/dL (ref 0.4–1.5)
GFR: 72.46 mL/min (ref >60.00)
Potassium: 4.7 mEq/L (ref 3.5–5.1)

## 2012-12-21 LAB — CBC WITH DIFFERENTIAL/PLATELET
Basophils Relative: 0.4 % (ref 0.0–3.0)
Eosinophils Relative: 2.6 % (ref 0.0–5.0)
HCT: 47.4 % (ref 39.0–52.0)
Lymphs Abs: 1.5 10*3/uL (ref 0.7–4.0)
MCV: 88.7 fl (ref 78.0–100.0)
Monocytes Absolute: 0.5 10*3/uL (ref 0.1–1.0)
Neutro Abs: 4.7 10*3/uL (ref 1.4–7.7)
RBC: 5.34 Mil/uL (ref 4.22–5.81)
WBC: 6.9 10*3/uL (ref 4.5–10.5)

## 2012-12-21 LAB — PSA: PSA: 2.49 ng/mL (ref 0.10–4.00)

## 2012-12-21 LAB — URINALYSIS, ROUTINE W REFLEX MICROSCOPIC
Bilirubin Urine: NEGATIVE
Ketones, ur: NEGATIVE
Nitrite: NEGATIVE
Total Protein, Urine: NEGATIVE
Urine Glucose: 100
pH: 6 (ref 5.0–8.0)

## 2012-12-21 LAB — LIPID PANEL
Cholesterol: 149 mg/dL (ref 0–200)
LDL Cholesterol: 79 mg/dL (ref 0–99)
Total CHOL/HDL Ratio: 5
Triglycerides: 196 mg/dL — ABNORMAL HIGH (ref 0.0–149.0)
VLDL: 39.2 mg/dL (ref 0.0–40.0)

## 2012-12-21 LAB — HEMOGLOBIN A1C: Hgb A1c MFr Bld: 7.5 % — ABNORMAL HIGH (ref 4.6–6.5)

## 2012-12-21 LAB — HEPATIC FUNCTION PANEL
Bilirubin, Direct: 0.2 mg/dL (ref 0.0–0.3)
Total Bilirubin: 1.1 mg/dL (ref 0.3–1.2)

## 2012-12-21 NOTE — Assessment & Plan Note (Signed)
stable overall by history and exam, recent data reviewed with pt, and pt to continue medical treatment as before,  to f/u any worsening symptoms or concerns Lab Results  Component Value Date   HGBA1C 6.8* 06/16/2012

## 2012-12-21 NOTE — Progress Notes (Signed)
Subjective:    Patient ID: Keith Herrera, male    DOB: 15-Feb-1932, 79 y.o.   MRN: [id number]  HPI  Here for wellness and f/u;  Overall doing ok;  Pt denies CP, worsening SOB, DOE, wheezing, orthopnea, PND, worsening LE edema, palpitations, dizziness or syncope.  Pt denies neurological change such as new headache, facial or extremity weakness.  Pt denies polydipsia, polyuria, or low sugar symptoms. Pt states overall good compliance with treatment and medications, good tolerability, and has been trying to follow lower cholesterol diet.  Pt denies worsening depressive symptoms, suicidal ideation or panic. No fever, night sweats, wt loss, loss of appetite, or other constitutional symptoms.  Pt states good ability with ADL's, has low fall risk, home safety reviewed and adequate, no other significant changes in hearing or vision, and only occasionally active with exercise. No acute complaints.  Overall good compliance with treatment, and good medicine tolerability. Past Medical History  Diagnosis Date  . GERD (gastroesophageal reflux disease)   . Hiatal hernia   . Hemorrhoids   . Adenomatous polyp of colon 05/2004  . Diverticulosis     colon  . Hypertension   . Hyperlipidemia   . Diabetes type 2, controlled   . Anxiety   . CAD (coronary artery disease)     s/p Cypher DES to LAD and pD1 2010;  LHC was done 6/12: EF 65%, circumflex patent, mid RCA 80-90% (small and nondominant), LAD and diagonal stents patent, LAD at the origin of the first diagonal 30%, ostial D2 90%, mid 80-90% (small vessel).  Continued medical therapy was recommended.   . Right inguinal hernia     s/p repair in 8/12  . Esophageal stricture   . Nephrolithiasis   . ED (erectile dysfunction)   . Allergic rhinitis   . IBS (irritable bowel syndrome)   . BPH (benign prostatic hyperplasia)   . Carotid stenosis     dopplers 02/2012: 0-39% RICA; 40-59% LICA   Past Surgical History  Procedure Date  . Coronary stent placement  10/2008    x 2  . Hernia repair     RIH with ultrapro patch    reports that he has quit smoking. He does not have any smokeless tobacco history on file. He reports that he does not drink alcohol or use illicit drugs. family history includes Cancer (age of onset:79) in his mother and Cancer (age of onset:80) in his sister. Allergies  Allergen Reactions  . Crestor (Rosuvastatin Calcium) Rash    All over body  . Lovastatin     REACTION: rash   Current Outpatient Prescriptions on File Prior to Visit  Medication Sig Dispense Refill  . aspirin 81 MG tablet Take 81 mg by mouth daily.        Marland Kitchen atorvastatin (LIPITOR) 10 MG tablet TAKE 1 TABLET EVERY DAY  90 tablet  3  . glucose blood test strip Use as directed two times daily. 250.00  100 each  12  . Ibuprofen (ADVIL) 200 MG CAPS Take by mouth as needed.      . lansoprazole (PREVACID) 30 MG capsule TAKE 1 CAPSULE EVERY DAY  90 capsule  3  . losartan (COZAAR) 100 MG tablet TAKE 1 TABLET EVERY DAY  90 tablet  3  . metFORMIN (GLUCOPHAGE) 500 MG tablet TAKE 2 TABLETS BY MOUTH EVERY DAY  180 tablet  2  . ONE TOUCH LANCETS MISC by Does not apply route 2 (two) times daily as needed.        Marland Kitchen  sildenafil (VIAGRA) 100 MG tablet Take 1 tablet (100 mg total) by mouth as needed for erectile dysfunction.  5 tablet  11   Review of Systems Constitutional: Negative for diaphoresis, activity change, appetite change or unexpected weight change.  HENT: Negative for hearing loss, ear pain, facial swelling, mouth sores and neck stiffness.   Eyes: Negative for pain, redness and visual disturbance.  Respiratory: Negative for shortness of breath and wheezing.   Cardiovascular: Negative for chest pain and palpitations.  Gastrointestinal: Negative for diarrhea, blood in stool, abdominal distention or other pain Genitourinary: Negative for hematuria, flank pain or change in urine volume.  Musculoskeletal: Negative for myalgias and joint swelling.  Skin: Negative for  color change and wound.  Neurological: Negative for syncope and numbness. other than noted Hematological: Negative for adenopathy.  Psychiatric/Behavioral: Negative for hallucinations, self-injury, decreased concentration and agitation.      Objective:   Physical Exam BP 128/80  Pulse 60  Temp 97.8 F (36.6 C) (Oral)  Ht 6' (1.829 m)  Wt 172 lb 8 oz (78.245 kg)  BMI 23.40 kg/m2  SpO2 98% VS noted,  Constitutional: Pt is oriented to person, place, and time. Appears well-developed and well-nourished. Mason Jim wt and appearance Head: Normocephalic and atraumatic.  Right Ear: External ear normal.  Left Ear: External ear normal.  Nose: Nose normal.  Mouth/Throat: Oropharynx is clear and moist.  Eyes: Conjunctivae and EOM are normal. Pupils are equal, round, and reactive to light.  Neck: Normal range of motion. Neck supple. No JVD present. No tracheal deviation present.  Cardiovascular: Normal rate, regular rhythm, normal heart sounds and intact distal pulses.   Pulmonary/Chest: Effort normal and breath sounds normal.  Abdominal: Soft. Bowel sounds are normal. There is no tenderness. No HSM  Musculoskeletal: Normal range of motion. Exhibits no edema.  Lymphadenopathy:  Has no cervical adenopathy.  Neurological: Pt is alert and oriented to person, place, and time. Pt has normal reflexes. No cranial nerve deficit. Motor/dtr intact/gait intact Skin: Skin is warm and dry. No rash noted.  Psychiatric:  Has  normal mood and affect. Behavior is normal.     Assessment & Plan:

## 2012-12-21 NOTE — Assessment & Plan Note (Signed)

## 2012-12-21 NOTE — Patient Instructions (Addendum)
Your next colonoscopy is due about July 2015 Please continue all other medications as before, and refills have been done if requested (the metformin) Please have the pharmacy call with any other refills you may need. Please go to the LAB in the Basement (turn left off the elevator) for the tests to be done today You will be contacted by phone if any changes need to be made immediately.  Otherwise, you will receive a letter about your results with an explanation, but please check with MyChart first. Thank you for enrolling in MyChart. Please follow the instructions below to securely access your online medical record. MyChart allows you to send messages to your doctor, view your test results, renew your prescriptions, schedule appointments, and more. To Log into My Chart online, please go by [employer] or [employer] to [email or web address], or download the MyChart App from the [employer] of [employer] .  Your Username is: [email or web address] (pass gwynn) Please send a practice Message on Mychart later today. Please return in 6 months, or sooner if needed, with Lab testing done 3-5 days before

## 2012-12-23 ENCOUNTER — Ambulatory Visit: Payer: Medicare Other | Admitting: Internal Medicine

## 2013-01-04 ENCOUNTER — Telehealth: Payer: Self-pay

## 2013-01-04 NOTE — Telephone Encounter (Signed)
Pt called [facility] requesting lab results due to being unable to get on my chart. Thanks

## 2013-01-04 NOTE — Telephone Encounter (Signed)
Patient's wife informed of results. 

## 2013-01-28 ENCOUNTER — Other Ambulatory Visit: Payer: Self-pay | Admitting: Internal Medicine

## 2013-02-22 ENCOUNTER — Encounter: Payer: Self-pay | Admitting: Internal Medicine

## 2013-02-22 ENCOUNTER — Ambulatory Visit (INDEPENDENT_AMBULATORY_CARE_PROVIDER_SITE_OTHER): Payer: Medicare Other | Admitting: Internal Medicine

## 2013-02-22 ENCOUNTER — Other Ambulatory Visit: Payer: Medicare Other

## 2013-02-22 VITALS — BP 134/82 | HR 67 | Temp 97.0°F | Ht 70.0 in | Wt 173.2 lb

## 2013-02-22 DIAGNOSIS — E119 Type 2 diabetes mellitus without complications: Secondary | ICD-10-CM

## 2013-02-22 DIAGNOSIS — L509 Urticaria, unspecified: Secondary | ICD-10-CM

## 2013-02-22 DIAGNOSIS — L508 Other urticaria: Secondary | ICD-10-CM

## 2013-02-22 DIAGNOSIS — I10 Essential (primary) hypertension: Secondary | ICD-10-CM

## 2013-02-22 MED ORDER — METHYLPREDNISOLONE ACETATE 80 MG/ML IJ SUSP
80.0000 mg | Freq: Once | INTRAMUSCULAR | Status: AC
  Administered 2013-02-22: 80 mg via INTRAMUSCULAR

## 2013-02-22 MED ORDER — TRIAMCINOLONE ACETONIDE 0.1 % EX CREA: TOPICAL_CREAM | Freq: Two times a day (BID) | CUTANEOUS | Status: DC

## 2013-02-22 MED ORDER — PREDNISONE 10 MG PO TABS: ORAL_TABLET | ORAL | Status: DC

## 2013-02-22 NOTE — Progress Notes (Signed)
Subjective:    Patient ID: Keith Herrera, male    DOB: 1931/12/04, 79 y.o.   MRN: [id number]  HPI    Here to f/u with acute visit for 3 days onset diffuse hive like rash with erythem lesions with itching that seem to come and go, no fever or pain, no swelling such as lips, tongue and Pt denies chest pain, increased sob or doe, wheezing, orthopnea, PND, increased LE swelling, palpitations, dizziness or syncope.  Pt denies new neurological symptoms such as new headache, or facial or extremity weakness or numbness   Eats something peanut daily, no other med or food obvious causes or other recent new exposures, or chemicals, pets or travel Past Medical History  Diagnosis Date  . GERD (gastroesophageal reflux disease)   . Hiatal hernia   . Hemorrhoids   . Adenomatous polyp of colon 05/2004  . Diverticulosis     colon  . Hypertension   . Hyperlipidemia   . Diabetes type 2, controlled   . Anxiety   . CAD (coronary artery disease)     s/p Cypher DES to LAD and pD1 2010;  LHC was done 6/12: EF 65%, circumflex patent, mid RCA 80-90% (small and nondominant), LAD and diagonal stents patent, LAD at the origin of the first diagonal 30%, ostial D2 90%, mid 80-90% (small vessel).  Continued medical therapy was recommended.   . Right inguinal hernia     s/p repair in 8/12  . Esophageal stricture   . Nephrolithiasis   . ED (erectile dysfunction)   . Allergic rhinitis   . IBS (irritable bowel syndrome)   . BPH (benign prostatic hyperplasia)   . Carotid stenosis     dopplers 02/2012: 0-39% RICA; 40-59% LICA   Past Surgical History  Procedure Laterality Date  . Coronary stent placement  10/2008    x 2  . Hernia repair      RIH with ultrapro patch    reports that he has quit smoking. He does not have any smokeless tobacco history on file. He reports that he does not drink alcohol or use illicit drugs. family history includes Cancer (age of onset: 82) in his mother and Cancer (age of onset: 30) in  his sister. Allergies  Allergen Reactions  . Crestor (Rosuvastatin Calcium) Rash    All over body  . Lovastatin     REACTION: rash   Current Outpatient Prescriptions on File Prior to Visit  Medication Sig Dispense Refill  . aspirin 81 MG tablet Take 81 mg by mouth daily.        Marland Kitchen atorvastatin (LIPITOR) 10 MG tablet TAKE 1 TABLET EVERY DAY  90 tablet  3  . glucose blood test strip Use as directed two times daily. 250.00  100 each  12  . Ibuprofen (ADVIL) 200 MG CAPS Take by mouth as needed.      . lansoprazole (PREVACID) 30 MG capsule TAKE 1 CAPSULE EVERY DAY  90 capsule  3  . losartan (COZAAR) 100 MG tablet TAKE 1 TABLET EVERY DAY  90 tablet  3  . metFORMIN (GLUCOPHAGE) 500 MG tablet TAKE 2 TABLETS BY MOUTH EVERY DAY  180 tablet  3  . ONE TOUCH LANCETS MISC by Does not apply route 2 (two) times daily as needed.        . sildenafil (VIAGRA) 100 MG tablet Take 1 tablet (100 mg total) by mouth as needed for erectile dysfunction.  5 tablet  11   No current facility-administered medications  on file prior to visit.    Review of Systems  Constitutional: Negative for unexpected weight change, or unusual diaphoresis  HENT: Negative for tinnitus.   Eyes: Negative for photophobia and visual disturbance.  Respiratory: Negative for choking and stridor.   Gastrointestinal: Negative for vomiting and blood in stool.  Genitourinary: Negative for hematuria and decreased urine volume.  Musculoskeletal: Negative for acute joint swelling Skin: Negative for color change and wound.  Neurological: Negative for tremors and numbness other than noted  Psychiatric/Behavioral: Negative for decreased concentration or  hyperactivity.       Objective:   Physical Exam BP 134/82  Pulse 67  Temp(Src) 97 F (36.1 C) (Oral)  Ht 5\' 10"  (1.778 m)  Wt 173 lb 4 oz (78.586 kg)  BMI 24.86 kg/m2  SpO2 97% ? Mild lower lip edema though he denies VS noted,  Constitutional: Pt appears well-developed and  well-nourished.  HENT: Head: NCAT.  Right Ear: External ear normal.  Left Ear: External ear normal.  Eyes: Conjunctivae and EOM are normal. Pupils are equal, round, and reactive to light.  Neck: Normal range of motion. Neck supple.  Cardiovascular: Normal rate and regular rhythm.   Pulmonary/Chest: Effort normal and breath sounds normal.  - no wheezes or rales Neurological: Pt is alert. Not confused  Skin: Skin is warm with diffuse hive like wheal and flare to torso and extremities Psychiatric: Pt behavior is normal. Thought content normal.     Assessment & Plan:

## 2013-02-22 NOTE — Assessment & Plan Note (Signed)
?   Peanut or other related, for lab eval, o/w depomedrol IM today, predpack asd, benadryl otc prn,  to f/u any worsening symptoms or concerns

## 2013-02-22 NOTE — Assessment & Plan Note (Signed)
stable overall by history and exam, recent data reviewed with pt, and pt to continue medical treatment as before,  to f/u any worsening symptoms or concerns Lab Results  Component Value Date   HGBA1C 7.5* 12/21/2012   To call for cbg > 200 or onset polys with steroid tx

## 2013-02-22 NOTE — Assessment & Plan Note (Signed)
stable overall by history and exam, recent data reviewed with pt, and pt to continue medical treatment as before,  to f/u any worsening symptoms or concerns BP Readings from Last 3 Encounters:  02/22/13 134/82  12/21/12 128/80  06/16/12 142/68

## 2013-02-22 NOTE — Patient Instructions (Signed)
You had the steroid shot today Please take all new medication as prescribed - the prednisone Please avoid all peanut products for now Please go to the LAB in the Basement (turn left off the elevator) for the tests to be done today - the general allergy and food allergy profiles Please continue all other medications as before, and refills have been done if requested. You can also use Benadryl 50 mg OTC every 6 hrs as needed for itching and swelling Please be aware your Blood Sugar can be mild increased due to the steroid treatment for a short time

## 2013-02-23 LAB — ALLERGY FULL AND FOOD SPECIFIC PROFILE
Allergen, D pternoyssinus,d7: <0.1 kU/L
Alternaria Alternata: <0.1 kU/L
Apple: <0.1 kU/L
Aspergillus fumigatus, m3: <0.1 kU/L
Candida Albicans: <0.1 kU/L
Chicken IgE: <0.1 kU/L
Common Ragweed: <0.1 kU/L
Corn: <0.1 kU/L
D. farinae: <0.1 kU/L
Egg White IgE: <0.1 kU/L
Fescue: <0.1 kU/L
House Dust Hollister: <0.1 kU/L
IgE (Immunoglobulin E), Serum: 83.3 IU/mL (ref 0.0–180.0)
Lamb's Quarters: <0.1 kU/L
Orange: <0.1 kU/L
Plantain: <0.1 kU/L
Shrimp IgE: <0.1 kU/L
Stemphylium Botryosum: <0.1 kU/L
Tomato IgE: <0.1 kU/L

## 2013-03-18 ENCOUNTER — Other Ambulatory Visit: Payer: Self-pay | Admitting: Internal Medicine

## 2013-03-22 ENCOUNTER — Other Ambulatory Visit: Payer: Self-pay | Admitting: Internal Medicine

## 2013-03-22 ENCOUNTER — Encounter (INDEPENDENT_AMBULATORY_CARE_PROVIDER_SITE_OTHER): Payer: Medicare Other

## 2013-03-22 DIAGNOSIS — I6529 Occlusion and stenosis of unspecified carotid artery: Secondary | ICD-10-CM

## 2013-03-22 DIAGNOSIS — R0989 Other specified symptoms and signs involving the circulatory and respiratory systems: Secondary | ICD-10-CM

## 2013-06-21 ENCOUNTER — Ambulatory Visit: Payer: Medicare Other | Admitting: Internal Medicine

## 2013-07-01 ENCOUNTER — Other Ambulatory Visit: Payer: Self-pay | Admitting: Internal Medicine

## 2013-07-05 ENCOUNTER — Other Ambulatory Visit: Payer: Self-pay | Admitting: Internal Medicine

## 2013-07-09 ENCOUNTER — Encounter: Payer: Self-pay | Admitting: Gastroenterology

## 2013-09-21 ENCOUNTER — Ambulatory Visit (INDEPENDENT_AMBULATORY_CARE_PROVIDER_SITE_OTHER): Payer: Medicare Other | Admitting: Internal Medicine

## 2013-09-21 ENCOUNTER — Other Ambulatory Visit (INDEPENDENT_AMBULATORY_CARE_PROVIDER_SITE_OTHER): Payer: Medicare Other

## 2013-09-21 ENCOUNTER — Encounter: Payer: Self-pay | Admitting: Internal Medicine

## 2013-09-21 VITALS — BP 130/80 | HR 77 | Temp 98.0°F | Ht 70.0 in | Wt 164.5 lb

## 2013-09-21 DIAGNOSIS — E785 Hyperlipidemia, unspecified: Secondary | ICD-10-CM

## 2013-09-21 DIAGNOSIS — I10 Essential (primary) hypertension: Secondary | ICD-10-CM

## 2013-09-21 DIAGNOSIS — Z23 Encounter for immunization: Secondary | ICD-10-CM

## 2013-09-21 DIAGNOSIS — E119 Type 2 diabetes mellitus without complications: Secondary | ICD-10-CM

## 2013-09-21 LAB — LIPID PANEL
HDL: 36 mg/dL — ABNORMAL LOW (ref >39.00)
Total CHOL/HDL Ratio: 4
VLDL: 47.4 mg/dL — ABNORMAL HIGH (ref 0.0–40.0)

## 2013-09-21 LAB — BASIC METABOLIC PANEL
GFR: 73.12 mL/min (ref >60.00)
Glucose, Bld: 176 mg/dL — ABNORMAL HIGH (ref 70–99)
Potassium: 4.2 mEq/L (ref 3.5–5.1)
Sodium: 140 mEq/L (ref 135–145)

## 2013-09-21 LAB — HEPATIC FUNCTION PANEL
AST: 17 U/L (ref 0–37)
Alkaline Phosphatase: 51 U/L (ref 39–117)
Total Bilirubin: 0.6 mg/dL (ref 0.3–1.2)

## 2013-09-21 MED ORDER — SILDENAFIL CITRATE 100 MG PO TABS: 100.0000 mg | ORAL_TABLET | ORAL | Status: DC | PRN

## 2013-09-21 NOTE — Addendum Note (Signed)
Addended by: [name] on: 09/21/2013 10:23 AM   Modules accepted: Orders

## 2013-09-21 NOTE — Assessment & Plan Note (Signed)
stable overall by history and exam, recent data reviewed with pt, and pt to continue medical treatment as before,  to f/u any worsening symptoms or concerns Lab Results  Component Value Date   HGBA1C 7.5* 12/21/2012

## 2013-09-21 NOTE — Assessment & Plan Note (Signed)
stable overall by history and exam, recent data reviewed with pt, and pt to continue medical treatment as before,  to f/u any worsening symptoms or concerns BP Readings from Last 3 Encounters:  09/21/13 130/80  02/22/13 134/82  12/21/12 128/80

## 2013-09-21 NOTE — Patient Instructions (Signed)
You had the flu shot today Please return in 2 wks for a Nurse Visit for the new Prevnar pneumonia shot Please continue all other medications as before, and refills have been done if requested. Please have the pharmacy call with any other refills you may need.  Please go to the LAB in the Basement (turn left off the elevator) for the tests to be done today You will be contacted by phone if any changes need to be made immediately.  Otherwise, you will receive a letter about your results with an explanation, but please check with MyChart first.  Please remember to sign up for My Chart if you have not done so, as this will be important to you in the future with finding out test results, communicating by private email, and scheduling acute appointments online when needed.  Please return in 6 months, or sooner if needed

## 2013-09-21 NOTE — Progress Notes (Signed)
Pre-visit discussion using our clinic review tool. No additional management support is needed unless otherwise documented below in the visit note.  

## 2013-09-21 NOTE — Assessment & Plan Note (Signed)
stable overall by history and exam, recent data reviewed with pt, and pt to continue medical treatment as before,  to f/u any worsening symptoms or concerns Lab Results  Component Value Date   LDLCALC 79 12/21/2012

## 2013-09-21 NOTE — Progress Notes (Signed)
Subjective:    Patient ID: Keith Herrera, male    DOB: 1932/07/29, 79 y.o.   MRN: [id number]  HPI  Here to f/u; overall doing ok,  Pt denies chest pain, increased sob or doe, wheezing, orthopnea, PND, increased LE swelling, palpitations, dizziness or syncope.  Pt denies polydipsia, polyuria, or low sugar symptoms such as weakness or confusion improved with po intake.  Pt denies new neurological symptoms such as new headache, or facial or extremity weakness or numbness.   Pt states overall good compliance with meds, has been trying to follow lower cholesterol, diabetic diet, with wt overall stable,  but little exercise however.  CBG's at home 98-130.  Has lost approx 10 lbs in the past yr with better diet.  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  Sees card on a regular basis with hx of CAD/stents Past Medical History  Diagnosis Date  . GERD (gastroesophageal reflux disease)   . Hiatal hernia   . Hemorrhoids   . Adenomatous polyp of colon 05/2004  . Diverticulosis     colon  . Hypertension   . Hyperlipidemia   . Diabetes type 2, controlled   . Anxiety   . CAD (coronary artery disease)     s/p Cypher DES to LAD and pD1 2010;  LHC was done 6/12: EF 65%, circumflex patent, mid RCA 80-90% (small and nondominant), LAD and diagonal stents patent, LAD at the origin of the first diagonal 30%, ostial D2 90%, mid 80-90% (small vessel).  Continued medical therapy was recommended.   . Right inguinal hernia     s/p repair in 8/12  . Esophageal stricture   . Nephrolithiasis   . ED (erectile dysfunction)   . Allergic rhinitis   . IBS (irritable bowel syndrome)   . BPH (benign prostatic hyperplasia)   . Carotid stenosis     dopplers 02/2012: 0-39% RICA; 40-59% LICA   Past Surgical History  Procedure Laterality Date  . Coronary stent placement  10/2008    x 2  . Hernia repair      RIH with ultrapro patch    reports that he has quit smoking. He does not have any smokeless  tobacco history on file. He reports that he does not drink alcohol or use illicit drugs. family history includes Cancer (age of onset: 50) in his mother; Cancer (age of onset: 51) in his sister. Allergies  Allergen Reactions  . Crestor [Rosuvastatin Calcium] Rash    All over body  . Lovastatin     REACTION: rash   Current Outpatient Prescriptions on File Prior to Visit  Medication Sig Dispense Refill  . aspirin 81 MG tablet Take 81 mg by mouth daily.        Marland Kitchen atorvastatin (LIPITOR) 10 MG tablet TAKE 1 TABLET EVERY DAY  90 tablet  3  . glucose blood test strip Use as directed two times daily. 250.00  100 each  12  . Ibuprofen (ADVIL) 200 MG CAPS Take by mouth as needed.      . lansoprazole (PREVACID) 30 MG capsule TAKE 1 CAPSULE EVERY DAY  90 capsule  3  . lansoprazole (PREVACID) 30 MG capsule TAKE 1 CAPSULE EVERY DAY  90 capsule  3  . losartan (COZAAR) 100 MG tablet TAKE 1 TABLET EVERY DAY  90 tablet  3  . metFORMIN (GLUCOPHAGE) 500 MG tablet TAKE 2 TABLETS BY MOUTH EVERY DAY  180 tablet  3  . ONE TOUCH LANCETS MISC by Does  not apply route 2 (two) times daily as needed.        . predniSONE (DELTASONE) 10 MG tablet 3 tabs by mouth per day for 3 days,2tabs per day for 3 days,1tab per day for 3 days  18 tablet  0  . sildenafil (VIAGRA) 100 MG tablet Take 1 tablet (100 mg total) by mouth as needed for erectile dysfunction.  5 tablet  11  . triamcinolone cream (KENALOG) 0.1 % Apply topically 2 (two) times daily.  85.2 g  1   No current facility-administered medications on file prior to visit.   Review of Systems S/p wisdom tooth out right lower yesterday  Constitutional: Negative for unexpected weight change, or unusual diaphoresis  HENT: Negative for tinnitus.   Eyes: Negative for photophobia and visual disturbance.  Respiratory: Negative for choking and stridor.   Gastrointestinal: Negative for vomiting and blood in stool.  Genitourinary: Negative for hematuria and decreased urine  volume.  Musculoskeletal: Negative for acute joint swelling Skin: Negative for color change and wound.  Neurological: Negative for tremors and numbness other than noted  Psychiatric/Behavioral: Negative for decreased concentration or  hyperactivity.       Objective:   Physical Exam BP 130/80  Pulse 77  Temp(Src) 98 F (36.7 C) (Oral)  Ht 5\' 10"  (1.778 m)  Wt 164 lb 8 oz (74.617 kg)  BMI 23.60 kg/m2  SpO2 98% VS noted,  Constitutional: Pt appears well-developed and well-nourished.  HENT: Head: NCAT.  Right Ear: External ear normal.  Left Ear: External ear normal.  Eyes: Conjunctivae and EOM are normal. Pupils are equal, round, and reactive to light.  Neck: Normal range of motion. Neck supple.  Cardiovascular: Normal rate and regular rhythm.   Pulmonary/Chest: Effort normal and breath sounds normal.  Abd:  Soft, NT, non-distended, + BS Neurological: Pt is alert. Not confused  Skin: Skin is warm. No erythema.  Psychiatric: Pt behavior is normal. Thought content normal.        Assessment & Plan:

## 2013-09-22 ENCOUNTER — Other Ambulatory Visit: Payer: Self-pay | Admitting: Internal Medicine

## 2013-09-22 MED ORDER — GLIPIZIDE ER 5 MG PO TB24: 5.0000 mg | ORAL_TABLET | Freq: Every day | ORAL | Status: DC

## 2013-10-05 ENCOUNTER — Ambulatory Visit (INDEPENDENT_AMBULATORY_CARE_PROVIDER_SITE_OTHER): Payer: Medicare Other

## 2013-10-05 ENCOUNTER — Ambulatory Visit: Payer: Medicare Other

## 2013-10-05 DIAGNOSIS — Z23 Encounter for immunization: Secondary | ICD-10-CM

## 2013-10-05 NOTE — Telephone Encounter (Signed)
error 

## 2013-11-09 ENCOUNTER — Other Ambulatory Visit: Payer: Self-pay | Admitting: Internal Medicine

## 2013-12-17 ENCOUNTER — Ambulatory Visit (INDEPENDENT_AMBULATORY_CARE_PROVIDER_SITE_OTHER): Payer: Medicare HMO | Admitting: Internal Medicine

## 2013-12-17 ENCOUNTER — Encounter: Payer: Self-pay | Admitting: Internal Medicine

## 2013-12-17 VITALS — BP 150/72 | HR 77 | Temp 97.1°F | Ht 72.0 in | Wt 172.1 lb

## 2013-12-17 DIAGNOSIS — I10 Essential (primary) hypertension: Secondary | ICD-10-CM

## 2013-12-17 DIAGNOSIS — Z0000 Encounter for general adult medical examination without abnormal findings: Secondary | ICD-10-CM

## 2013-12-17 MED ORDER — ONETOUCH LANCETS MISC: Status: DC

## 2013-12-17 MED ORDER — GLUCOSE BLOOD VI STRP: ORAL_STRIP | Status: DC

## 2013-12-17 MED ORDER — AMLODIPINE BESYLATE 5 MG PO TABS: 5.0000 mg | ORAL_TABLET | Freq: Every day | ORAL | Status: DC

## 2013-12-17 NOTE — Assessment & Plan Note (Signed)
Not charged today, but due for zostvax - gave rx to use at [facility] or similar, and colonoscopy f/u - will refer

## 2013-12-17 NOTE — Patient Instructions (Addendum)
Please take all new medication as prescribed - the amlodipine 5 mg per day Please continue all other medications as before, and refills have been done if requested. Please have the pharmacy call with any other refills you may need.  Please continue to monitor your Blood Pressure on a regular basis, with the goal being less than 140/90  You are given the prescription for shingles shot to take to [facility] or other pharmacy

## 2013-12-17 NOTE — Progress Notes (Signed)
Pre-visit discussion using our clinic review tool. No additional management support is needed unless otherwise documented below in the visit note.  

## 2013-12-17 NOTE — Assessment & Plan Note (Signed)
Uncontrolled mild, to add norvasc 5 qd, low salt diet, reg exercise, wt loss, f/u bp at home and next visit - goal < 140/90

## 2013-12-17 NOTE — Progress Notes (Signed)
Subjective:    Patient ID: Keith Herrera, male    DOB: 07/30/1932, 80 y.o.   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 Admits to more pork and salt in diet recently.  Pt denies chest pain, increased sob or doe, wheezing, orthopnea, PND, increased LE swelling, palpitations, dizziness or syncope.   Pt denies polydipsia, polyuria, or low sugar symptoms such as weakness or confusion improved with po intake.  Pt states overall good compliance with meds, trying to follow lower cholesterol, diabetic diet, wt overall stable but little exercise however.   Recent CBG at work as well was deemed acceptable.  Ran out of strips, needs new script. Overall good compliance with treatment, and good medicine tolerability. Checks BP at home, but not checked recently.   Past Medical History  Diagnosis Date  . GERD (gastroesophageal reflux disease)   . Hiatal hernia   . Hemorrhoids   . Adenomatous polyp of colon 05/2004  . Diverticulosis     colon  . Hypertension   . Hyperlipidemia   . Diabetes type 2, controlled   . Anxiety   . CAD (coronary artery disease)     s/p Cypher DES to LAD and pD1 2010;  LHC was done 6/12: EF 65%, circumflex patent, mid RCA 80-90% (small and nondominant), LAD and diagonal stents patent, LAD at the origin of the first diagonal 30%, ostial D2 90%, mid 80-90% (small vessel).  Continued medical therapy was recommended.   . Right inguinal hernia     s/p repair in 8/12  . Esophageal stricture   . Nephrolithiasis   . ED (erectile dysfunction)   . Allergic rhinitis   . IBS (irritable bowel syndrome)   . BPH (benign prostatic hyperplasia)   . Carotid stenosis     dopplers 02/2012: 3-82% RICA; 50-53% LICA   Past Surgical History  Procedure Laterality Date  . Coronary stent placement  10/2008    x 2  . Hernia repair      RIH with ultrapro patch    reports that he has quit smoking. He does not have any smokeless tobacco history on file. He reports that he does not drink alcohol or use illicit drugs. family history includes Cancer (age of onset: 30) in his mother; Cancer (age of onset: 69) in his sister. Allergies  Allergen Reactions  . Crestor [Rosuvastatin Calcium] Rash    All over body  . Lovastatin     REACTION: rash     Review of Systems All otherwise neg per pt     Objective:   Physical Exam BP 150/72  Pulse 77  Temp(Src) 97.1 F (36.2 C) (Oral)  Ht 6' (1.829 m)  Wt 172 lb 2 oz (78.075 kg)  BMI 23.34 kg/m2  SpO2 95% VS noted,  Constitutional: Pt appears well-developed and well-nourished.  HENT: Head: NCAT.  Right Ear: External ear normal.  Left Ear: External ear normal.  Eyes: Conjunctivae and EOM are normal. Pupils are equal, round, and reactive to light.  Neck: Normal range of motion. Neck supple.  Cardiovascular: Normal rate and regular rhythm.   Pulmonary/Chest: Effort normal and breath sounds normal.  Neurological: Pt is alert. Not confused  Skin: Skin is warm. No erythema.  Psychiatric: Pt behavior is normal. Thought content normal.      Assessment &  Plan:

## 2013-12-17 NOTE — Addendum Note (Signed)
Addended by: Sharon Seller B on: 12/17/2013 04:20 PM   Modules accepted: Orders

## 2013-12-24 ENCOUNTER — Encounter: Payer: Self-pay | Admitting: Gastroenterology

## 2014-02-03 ENCOUNTER — Other Ambulatory Visit: Payer: Self-pay | Admitting: Internal Medicine

## 2014-02-10 ENCOUNTER — Encounter: Payer: Medicare HMO | Admitting: Gastroenterology

## 2014-03-15 ENCOUNTER — Other Ambulatory Visit: Payer: Self-pay | Admitting: Internal Medicine

## 2014-04-13 ENCOUNTER — Other Ambulatory Visit (HOSPITAL_COMMUNITY): Payer: Self-pay | Admitting: *Deleted

## 2014-04-13 DIAGNOSIS — I6529 Occlusion and stenosis of unspecified carotid artery: Secondary | ICD-10-CM

## 2014-04-19 ENCOUNTER — Encounter (HOSPITAL_COMMUNITY): Payer: Medicare HMO

## 2014-06-09 ENCOUNTER — Other Ambulatory Visit: Payer: Self-pay | Admitting: Internal Medicine

## 2014-06-15 ENCOUNTER — Other Ambulatory Visit: Payer: Self-pay | Admitting: Internal Medicine

## 2014-06-27 ENCOUNTER — Ambulatory Visit (INDEPENDENT_AMBULATORY_CARE_PROVIDER_SITE_OTHER): Payer: Medicare HMO | Admitting: Internal Medicine

## 2014-06-27 ENCOUNTER — Other Ambulatory Visit (INDEPENDENT_AMBULATORY_CARE_PROVIDER_SITE_OTHER): Payer: Medicare HMO

## 2014-06-27 ENCOUNTER — Encounter: Payer: Self-pay | Admitting: Internal Medicine

## 2014-06-27 VITALS — BP 151/75 | HR 59 | Temp 98.2°F | Wt 169.1 lb

## 2014-06-27 DIAGNOSIS — I4902 Ventricular flutter: Secondary | ICD-10-CM

## 2014-06-27 DIAGNOSIS — R42 Dizziness and giddiness: Secondary | ICD-10-CM

## 2014-06-27 DIAGNOSIS — R55 Syncope and collapse: Secondary | ICD-10-CM

## 2014-06-27 DIAGNOSIS — I498 Other specified cardiac arrhythmias: Secondary | ICD-10-CM

## 2014-06-27 DIAGNOSIS — E1159 Type 2 diabetes mellitus with other circulatory complications: Secondary | ICD-10-CM

## 2014-06-27 DIAGNOSIS — R51 Headache: Secondary | ICD-10-CM

## 2014-06-27 DIAGNOSIS — R519 Headache, unspecified: Secondary | ICD-10-CM

## 2014-06-27 LAB — CBC WITH DIFFERENTIAL/PLATELET
Basophils Absolute: 0 10*3/uL (ref 0.0–0.1)
Basophils Relative: 0.5 % (ref 0.0–3.0)
EOS ABS: 0.2 10*3/uL (ref 0.0–0.7)
Eosinophils Relative: 2.1 % (ref 0.0–5.0)
HCT: 48 % (ref 39.0–52.0)
Hemoglobin: 16.3 g/dL (ref 13.0–17.0)
Lymphocytes Relative: 20.4 % (ref 12.0–46.0)
Lymphs Abs: 1.5 10*3/uL (ref 0.7–4.0)
MCHC: 33.9 g/dL (ref 30.0–36.0)
MCV: 88.1 fl (ref 78.0–100.0)
MONO ABS: 0.6 10*3/uL (ref 0.1–1.0)
Monocytes Relative: 8 % (ref 3.0–12.0)
NEUTROS PCT: 69 % (ref 43.0–77.0)
Neutro Abs: 5.1 10*3/uL (ref 1.4–7.7)
PLATELETS: 187 10*3/uL (ref 150.0–400.0)
RBC: 5.44 Mil/uL (ref 4.22–5.81)
RDW: 13.1 % (ref 11.5–15.5)
WBC: 7.3 10*3/uL (ref 4.0–10.5)

## 2014-06-27 LAB — BASIC METABOLIC PANEL
BUN: 19 mg/dL (ref 6–23)
CHLORIDE: 106 meq/L (ref 96–112)
CO2: 25 meq/L (ref 19–32)
CREATININE: 1.1 mg/dL (ref 0.4–1.5)
Calcium: 9.8 mg/dL (ref 8.4–10.5)
GFR: 67 mL/min (ref >60.00)
GLUCOSE: 136 mg/dL — AB (ref 70–99)
Potassium: 4.7 mEq/L (ref 3.5–5.1)
Sodium: 140 mEq/L (ref 135–145)

## 2014-06-27 LAB — TSH: TSH: 2.01 u[IU]/mL (ref 0.35–4.50)

## 2014-06-27 LAB — HEMOGLOBIN A1C: HEMOGLOBIN A1C: 7.6 % — AB (ref 4.6–6.5)

## 2014-06-27 MED ORDER — AMOXICILLIN 500 MG PO CAPS: 500.0000 mg | ORAL_CAPSULE | Freq: Three times a day (TID) | ORAL | Status: DC

## 2014-06-27 NOTE — Progress Notes (Signed)
   Subjective:    Patient ID: Keith Herrera, male    DOB: [birth year], 80 y.o.   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 This was approximately 1 PM one half hour after ingesting a sandwich and a few sips of the diet drink. He describes some quote fluttering" for a few seconds as chest without associated chest pain It has not recurred He has continued have a right frontal lobe heaviness". [place] with minimal benefit. This associated with sinus congestion. He states his right nasal passage remains stopped up in these been using and over-the-counter Afrin type preparation. He describes some low-grade fever and some sweats. He also has increase gas necessitating burping He is a diabetic; his last A-1 C was 9.6% on 09/21/13. Is not check fasting blood sugars. His last glucose was 130 one afternoon He has a past history of "inner ear".   Review of Systems  Symptoms are not related to turning his head or aggravated by turning over in bed. The question some postural component. Symptoms were not related to any physical effort such as lifting restraining. He denies any associated weakness in his arms and legs. Unrelated or some intermittent numbness or tingling in the extremities. You have he's not had discolored nasal secretions; hearing loss; otic pain; otic discharge. He describes blurred vision without double vision loss of vision There was no associated shortness of breath, cough, opposites.      Objective:   Physical Exam Positive or pertinent findings include: Pattern baldness is present.  He has decreased hearing bilaterally. As an upper dental plate.  There is erythema of the nasal septum.  Extraocular motions intact. With superior gaze the left eye deviates laterally.  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 Ventral  hernia is present. Gait is broad & slightly unsteady. Romberg testing was also slightly unsteady.  General appearance :adequately nourished; in no distress Eyes: No conjunctival inflammation or scleral icterus is present. Oral exam: Lips and gums are healthy appearing.There is no oropharyngeal erythema or exudate noted.  Heart:  Normal rate and regular rhythm. S1 and S2 normal without gallop, murmur, click, rub or other extra sounds   Lungs:Chest clear to auscultation; no wheezes, rhonchi,rales ,or rubs present.No increased work of breathing.  Abdomen: bowel sounds normal, soft and non-tender without masses or organomegaly noted.  No guarding or rebound. No flank tenderness to percussion. Skin:Warm & dry.  Intact without suspicious lesions or rashes ; no jaundice or tenting Lymphatic: No lymphadenopathy is noted about the head, neck, axilla          Assessment & Plan:  #1lightheadeness #2 sinus pain & fever in context  Of PMH of inner ear #3transient fluttering 06/24/14 only #4near syncope, resolved #5 DM ? control See orders & AVS

## 2014-06-27 NOTE — Progress Notes (Signed)
Pre visit review using our clinic review tool, if applicable. No additional management support is needed unless otherwise documented below in the visit note. 

## 2014-06-27 NOTE — Patient Instructions (Signed)
Plain Mucinex (NOT D) for thick secretions ;force NON dairy fluids .   Nasal cleansing in the shower as discussed with lather of mild shampoo.After 10 seconds wash off lather while  exhaling through nostrils. Make sure that all residual soap is removed to prevent irritation.  Flonase OR Nasacort AQ 1 spray in each nostril twice a day as needed. Use the "crossover" technique into opposite nostril spraying toward opposite ear @ 45 degree angle, not straight up into nostril.  Use a Neti pot daily only  as needed for significant sinus congestion; going from open side to congested side . Plain Allegra (NOT D )  160 daily , Loratidine 10 mg , OR Zyrtec 10 mg @ bedtime  as needed for itchy eyes & sneezing.   Your next office appointment will be determined based upon review of your pending labs . Those instructions will be transmitted to you through My Chart  OR  by mail;whichever process is your choice to receive results & recommendations .  Followup as needed for your acute issue. Please report any significant change in your symptoms.

## 2014-06-29 ENCOUNTER — Telehealth: Payer: Self-pay | Admitting: *Deleted

## 2014-06-29 ENCOUNTER — Telehealth: Payer: Self-pay | Admitting: Cardiovascular Disease

## 2014-06-29 NOTE — Telephone Encounter (Signed)
Pt calls with multiple issues at this time:  1.) states he has been having dizzy spells since last Friday 2.) states he has also been having "fluttering" hurting in the chest 3-5 times a day since last Friday 3.)states he has been unable to get an appointment with Dr. Burt Knack in 2 years.  I could not find any record of him calling for appointment. I apologized that he might not have received an appointment reminder.  He saw his pcp yesterday & was started on an antibiotic. He feels worse than yesterday according to pt.  Have advised him to call 911 go to the ED He is not sure this is what he wants to do. He also has carotid ultrasound here tomorrow.  Appointment made with Dr. Burt Knack for October  "if I am still alive by then!"  Horton Chin RN

## 2014-06-29 NOTE — Telephone Encounter (Signed)
Left msg on triage requesting lab results from Monday 06/27/14...[name]

## 2014-06-29 NOTE — Telephone Encounter (Signed)
New message          C/o dizzy spells / pt would like to see dr cooper and does not want to see anyone else / pt also thinks his heart is out of rhythm

## 2014-06-30 ENCOUNTER — Ambulatory Visit (HOSPITAL_COMMUNITY): Payer: Medicare HMO | Attending: Cardiovascular Disease | Admitting: Cardiology

## 2014-06-30 DIAGNOSIS — I10 Essential (primary) hypertension: Secondary | ICD-10-CM | POA: Insufficient documentation

## 2014-06-30 DIAGNOSIS — E119 Type 2 diabetes mellitus without complications: Secondary | ICD-10-CM | POA: Insufficient documentation

## 2014-06-30 DIAGNOSIS — Z87891 Personal history of nicotine dependence: Secondary | ICD-10-CM | POA: Diagnosis not present

## 2014-06-30 DIAGNOSIS — I2510 Atherosclerotic heart disease of native coronary artery without angina pectoris: Secondary | ICD-10-CM | POA: Insufficient documentation

## 2014-06-30 DIAGNOSIS — I6529 Occlusion and stenosis of unspecified carotid artery: Secondary | ICD-10-CM

## 2014-06-30 NOTE — Telephone Encounter (Signed)
Called pt bck inform him md has release results to mychart. Gave him md recommendations on labs...Keith Herrera

## 2014-06-30 NOTE — Progress Notes (Signed)
Carotid duplex performed 

## 2014-07-04 NOTE — Telephone Encounter (Signed)
Appointment scheduled on 07/05/14 with Dr Burt Knack.

## 2014-07-05 ENCOUNTER — Ambulatory Visit (INDEPENDENT_AMBULATORY_CARE_PROVIDER_SITE_OTHER): Payer: Commercial Managed Care - HMO | Admitting: Cardiovascular Disease

## 2014-07-05 ENCOUNTER — Encounter: Payer: Self-pay | Admitting: Cardiovascular Disease

## 2014-07-05 VITALS — BP 122/68 | HR 60 | Ht 72.0 in | Wt 170.2 lb

## 2014-07-05 DIAGNOSIS — I25119 Atherosclerotic heart disease of native coronary artery with unspecified angina pectoris: Secondary | ICD-10-CM

## 2014-07-05 DIAGNOSIS — I209 Angina pectoris, unspecified: Secondary | ICD-10-CM

## 2014-07-05 DIAGNOSIS — I2510 Atherosclerotic heart disease of native coronary artery without angina pectoris: Secondary | ICD-10-CM

## 2014-07-05 DIAGNOSIS — R079 Chest pain, unspecified: Secondary | ICD-10-CM

## 2014-07-05 NOTE — Patient Instructions (Signed)
Your physician has requested that you have an exercise stress myoview. For further information please visit [email or web address]. Please follow instruction sheet, as given.  Your physician recommends that you keep your scheduled follow-up appointment with Dr Burt Knack in October.  Your physician recommends that you continue on your current medications as directed. Please refer to the Current Medication list given to you today.

## 2014-07-05 NOTE — Progress Notes (Signed)
HPI:  80 year old gentleman presenting for followup evaluation. The patient has coronary artery disease and has undergone stenting of the LAD and diagonal branches in 2010. His coronary artery disease was identified by an abnormal stress Myoview scan. The patient was asymptomatic at the time of his initial presentation. He is also followed for hypertension, hyperlipidemia, and palpitations. His most recent cardiac catheterization was in 2012 and demonstrated patency of his LAD and diagonal stents. He has some small vessel disease involving his diagonal branches and a nondominant RCA.  He's been on long-term atorvastatin. Most recent lipids from 09/21/2014: Lipid Panel     Component Value Date/Time   CHOL 157 09/21/2013 1025   TRIG 237.0* 09/21/2013 1025   HDL 36.00* 09/21/2013 1025   CHOLHDL 4 09/21/2013 1025   VLDL 47.4* 09/21/2013 1025   LDLCALC 79 12/21/2012 0859   The patient has not been feeling well. 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 He thought he may pass out but symptoms ultimately resolved. He's had few other episodes of dizziness. He complains of palpitations at rest. Also has had a feeling of lower chest discomfort. States it feels like he 'needs to belch.' No clear exacerbating or relieving factors. No radiation of pain to the neck, shoulders, or arms. No other complaints.    Outpatient Encounter Prescriptions as of 07/05/2014  Medication Sig  . amLODipine (NORVASC) 5 MG tablet Take 1 tablet (5 mg total) by mouth daily.  Marland Kitchen aspirin 81 MG tablet Take 81 mg by mouth daily.    Marland Kitchen atorvastatin (LIPITOR) 10 MG tablet TAKE 1 TABLET EVERY DAY  . glucose blood test strip USE AS INSTRUCTED 250.02 check blood sugar twice daily  . lansoprazole (PREVACID) 30 MG capsule TAKE 1 CAPSULE EVERY DAY  . losartan (COZAAR) 100 MG tablet TAKE 1 TABLET EVERY DAY  . metFORMIN (GLUCOPHAGE) 500 MG tablet TAKE 2 TABLETS BY MOUTH EVERY DAY  .  ONE TOUCH LANCETS MISC Use as directed to check blood sugar twice daily.  Diagnosis code 250.00  . [DISCONTINUED] amoxicillin (AMOXIL) 500 MG capsule Take 1 capsule (500 mg total) by mouth 3 (three) times daily.    Allergies  Allergen Reactions  . Crestor [Rosuvastatin Calcium] Rash    All over body  . Lovastatin     REACTION: rash    Past Medical History  Diagnosis Date  . GERD (gastroesophageal reflux disease)   . Hiatal hernia   . Hemorrhoids   . Adenomatous polyp of colon 05/2004  . Diverticulosis     colon  . Hypertension   . Hyperlipidemia   . Diabetes type 2, controlled   . Anxiety   . CAD (coronary artery disease)     s/p Cypher DES to LAD and pD1 2010;  LHC was done 6/12: EF 65%, circumflex patent, mid RCA 80-90% (small and nondominant), LAD and diagonal stents patent, LAD at the origin of the first diagonal 30%, ostial D2 90%, mid 80-90% (small vessel).  Continued medical therapy was recommended.   . Right inguinal hernia     s/p repair in 8/12  . Esophageal stricture   . Nephrolithiasis   . ED (erectile dysfunction)   . Allergic rhinitis   . IBS (irritable bowel syndrome)   . BPH (benign prostatic hyperplasia)   . Carotid stenosis     dopplers 02/2012: 2-12% RICA; 24-82% LICA    ROS: Negative except as per HPI  BP 122/68  Pulse 60  Ht 6' (1.829 m)  Wt 170 lb 3.2 oz (77.202 kg)  BMI 23.08 kg/m2  PHYSICAL EXAM: Pt is alert and oriented, pleasant elderly male in NAD HEENT: normal Neck: JVP - normal, carotids 2+= without bruits Lungs: CTA bilaterally CV: RRR without murmur or gallop Abd: soft, NT, Positive BS, no hepatomegaly Ext: no C/C/E, distal pulses intact and equal Skin: warm/dry no rash  EKG:  NSR 60 bpm, nonspecific ST abnormality  ASSESSMENT AND PLAN: 1. CAD, native vessel, with episodes of chest discomfort at rest. Symptoms are primarily atypical, but also associated with palpitations and near syncope. He initially presented with severe  obstructive coronary disease at a time when he was completely asymptomatic. I have recommended a stress Myoview to evaluate for myocardial ischemia considering his history of asymptomatic presentation and previous stenting of the proximal LAD and diagonal branches with the potentially large area of myocardium at risk.  2. Palpitations. Previous event monitoring has been done. This is been a long-standing complaint. No major change in symptoms.  3. Essential Hypertension. Blood pressure is controlled on amlodipine and losartan.  4. Near syncope. Symptoms sound most consistent with a vasovagal event. We discussed increasing his fluid intake, also discussed typical prodromal symptoms and that he should immediately lie down if these occur.  5. Hyperlipidemia. Patient takes atorvastatin. His lipids are reviewed as above.  Sherren Mocha 07/08/2014 7:50 AM

## 2014-07-08 ENCOUNTER — Encounter: Payer: Self-pay | Admitting: Cardiovascular Disease

## 2014-07-11 ENCOUNTER — Ambulatory Visit (HOSPITAL_COMMUNITY): Payer: Medicare HMO | Attending: Cardiovascular Disease | Admitting: Radiology

## 2014-07-11 VITALS — BP 155/68 | Ht 72.0 in | Wt 166.0 lb

## 2014-07-11 DIAGNOSIS — R031 Nonspecific low blood-pressure reading: Secondary | ICD-10-CM | POA: Diagnosis not present

## 2014-07-11 DIAGNOSIS — R079 Chest pain, unspecified: Secondary | ICD-10-CM | POA: Diagnosis present

## 2014-07-11 DIAGNOSIS — Z9861 Coronary angioplasty status: Secondary | ICD-10-CM | POA: Diagnosis not present

## 2014-07-11 DIAGNOSIS — I2510 Atherosclerotic heart disease of native coronary artery without angina pectoris: Secondary | ICD-10-CM | POA: Insufficient documentation

## 2014-07-11 DIAGNOSIS — R0789 Other chest pain: Secondary | ICD-10-CM

## 2014-07-11 DIAGNOSIS — Z8249 Family history of ischemic heart disease and other diseases of the circulatory system: Secondary | ICD-10-CM | POA: Insufficient documentation

## 2014-07-11 DIAGNOSIS — R9431 Abnormal electrocardiogram [ECG] [EKG]: Secondary | ICD-10-CM | POA: Insufficient documentation

## 2014-07-11 DIAGNOSIS — I4949 Other premature depolarization: Secondary | ICD-10-CM

## 2014-07-11 MED ORDER — TECHNETIUM TC 99M SESTAMIBI GENERIC - CARDIOLITE
30.0000 | Freq: Once | INTRAVENOUS | Status: AC | PRN
  Administered 2014-07-11: 30 via INTRAVENOUS

## 2014-07-11 MED ORDER — TECHNETIUM TC 99M SESTAMIBI GENERIC - CARDIOLITE
10.0000 | Freq: Once | INTRAVENOUS | Status: AC | PRN
  Administered 2014-07-11: 10 via INTRAVENOUS

## 2014-07-11 NOTE — Progress Notes (Signed)
[facility] [address] [place], [place] [address] [phone]    Cardiology Nuclear Med Study  Keith Herrera is a 80 y.o. male     MRN : [id number]     DOB: [birth year]  Procedure Date: 07/11/2014  Nuclear Med Background Indication for Stress Test:  Evaluation for Ischemia and Stent Patency History:  CAD-STENT '11 MPI:EF: 66% inferoseptal thinning Cardiac Risk Factors: Carotid Disease, Hypertension, Lipids and NIDDM  Symptoms:  Chest Pain and Palpitations   Nuclear Pre-Procedure Caffeine/Decaff Intake:  7:30am decaffeinated coffee NPO After: 7:30am   Lungs:  clear O2 Sat: 97% on room air. IV 0.9% NS with Angio Cath:  22g  IV Site: R Antecubital x 1, tolerated well IV Started by:  Irven Baltimore, RN  Chest Size (in):  42 Cup Size: n/a  Height: 6' (1.829 m)  Weight:  166 lb (75.297 kg)  BMI:  Body mass index is 22.51 kg/(m^2). Tech Comments:  Patient took Metformin, and Norvasc this am . Irven Baltimore, RN.    Nuclear Med Study 1 or 2 day study: 1 day  Stress Test Type:  Stress  Reading MD: N/A  Order Authorizing Provider:  Sherren Mocha, MD  Resting Radionuclide: Technetium 10m Sestamibi  Resting Radionuclide Dose: 11.0 mCi   Stress Radionuclide:  Technetium 6m Sestamibi  Stress Radionuclide Dose: 33.0 mCi           Stress Protocol Rest HR: 59 Stress HR: 122  Rest BP: 155/68 Stress BP: 181/70  Exercise Time (min): 5:30 METS: 7.0   Predicted Max HR: 140 bpm % Max HR: 87.14 bpm Rate Pressure Product: 22082   Dose of Adenosine (mg):  n/a Dose of Lexiscan: n/a mg  Dose of Atropine (mg): n/a Dose of Dobutamine: n/a mcg/kg/min (at max HR)  Stress Test Technologist: Perrin Maltese, EMT-P  Nuclear Technologist:  Vedia Pereyra, CNMT     Rest Procedure:  Myocardial perfusion imaging was performed at rest 45 minutes following the intravenous administration of Technetium 73m Sestamibi. Rest ECG: NSR with downsloping ST segments in the  inferior leads and occasional PVC  Stress Procedure:  The patient exercised on the treadmill utilizing the Bruce Protocol for 5:30 minutes. The patient stopped due to fatigue and denied any chest pain.  Technetium 22m Sestamibi was injected at peak exercise and myocardial perfusion imaging was performed after a brief delay. Stress ECG: There was 83mm of horizontal to upsloping ST segment depression in the inferolateral leads with occasional PVC's  QPS Raw Data Images:  Mild diaphragmatic attenuation.  Normal left ventricular size. Stress Images:  Normal homogeneous uptake in all areas of the myocardium. Rest Images:  Normal homogeneous uptake in all areas of the myocardium. Subtraction (SDS):  No evidence of ischemia. Transient Ischemic Dilatation (Normal <1.22):  0.93 Lung/Heart Ratio (Normal <0.45):  0.27  Quantitative Gated Spect Images QGS EDV:  60 ml QGS ESV:  17 ml  Impression Exercise Capacity:  Fair exercise capacity. BP Response:  Hypotensive blood pressure response. Clinical Symptoms:  fatigue ECG Impression:  Nondiagnostic EKG due to baseline ST abnormality Comparison with Prior Nuclear Study: No images to compare  Overall Impression:  Low risk stress nuclear study Normal myocardial perfusion with no evidence of ischemia.  EKG showed baseline ST abnormality with 33mm of horizontal to upsloping ST segment depression during exercise.  Fair exercise tolerance..  LV Ejection Fraction: 71%.  LV Wall Motion:  NL LV Function; NL Wall Motion  Signed: Fransico Him,  MD [facility]

## 2014-08-16 ENCOUNTER — Other Ambulatory Visit: Payer: Self-pay

## 2014-08-16 MED ORDER — LANSOPRAZOLE 30 MG PO CPDR: 30.0000 mg | DELAYED_RELEASE_CAPSULE | Freq: Every day | ORAL | Status: DC

## 2014-08-22 ENCOUNTER — Encounter: Payer: Self-pay | Admitting: Cardiovascular Disease

## 2014-09-15 ENCOUNTER — Ambulatory Visit: Payer: Medicare HMO | Admitting: Cardiovascular Disease

## 2014-11-25 ENCOUNTER — Ambulatory Visit: Payer: Medicare HMO | Admitting: Cardiovascular Disease

## 2014-12-10 ENCOUNTER — Other Ambulatory Visit: Payer: Self-pay | Admitting: Internal Medicine

## 2014-12-13 ENCOUNTER — Other Ambulatory Visit (INDEPENDENT_AMBULATORY_CARE_PROVIDER_SITE_OTHER): Payer: Commercial Managed Care - HMO

## 2014-12-13 ENCOUNTER — Ambulatory Visit (INDEPENDENT_AMBULATORY_CARE_PROVIDER_SITE_OTHER): Payer: Commercial Managed Care - HMO | Admitting: Internal Medicine

## 2014-12-13 ENCOUNTER — Encounter: Payer: Self-pay | Admitting: Internal Medicine

## 2014-12-13 VITALS — BP 120/70 | HR 69 | Temp 98.2°F | Ht 71.0 in | Wt 167.5 lb

## 2014-12-13 DIAGNOSIS — Z0000 Encounter for general adult medical examination without abnormal findings: Secondary | ICD-10-CM | POA: Diagnosis not present

## 2014-12-13 DIAGNOSIS — J069 Acute upper respiratory infection, unspecified: Secondary | ICD-10-CM

## 2014-12-13 DIAGNOSIS — E119 Type 2 diabetes mellitus without complications: Secondary | ICD-10-CM | POA: Diagnosis not present

## 2014-12-13 DIAGNOSIS — L299 Pruritus, unspecified: Secondary | ICD-10-CM | POA: Diagnosis not present

## 2014-12-13 LAB — URINALYSIS, ROUTINE W REFLEX MICROSCOPIC
HGB URINE DIPSTICK: NEGATIVE
LEUKOCYTES UA: NEGATIVE
NITRITE: NEGATIVE
RBC / HPF: NONE SEEN (ref 0–?)
Specific Gravity, Urine: >=1.03 — AB (ref 1.000–1.030)
Urine Glucose: NEGATIVE
Urobilinogen, UA: 0.2 (ref 0.0–1.0)
pH: 5.5 (ref 5.0–8.0)

## 2014-12-13 LAB — CBC WITH DIFFERENTIAL/PLATELET
BASOS PCT: 0.3 % (ref 0.0–3.0)
Basophils Absolute: 0 10*3/uL (ref 0.0–0.1)
EOS PCT: 2.5 % (ref 0.0–5.0)
Eosinophils Absolute: 0.2 10*3/uL (ref 0.0–0.7)
HCT: 48.5 % (ref 39.0–52.0)
HEMOGLOBIN: 16.6 g/dL (ref 13.0–17.0)
Lymphocytes Relative: 18.5 % (ref 12.0–46.0)
Lymphs Abs: 1.6 10*3/uL (ref 0.7–4.0)
MCHC: 34.3 g/dL (ref 30.0–36.0)
MCV: 86.1 fl (ref 78.0–100.0)
MONOS PCT: 8.7 % (ref 3.0–12.0)
Monocytes Absolute: 0.8 10*3/uL (ref 0.1–1.0)
Neutro Abs: 6.1 10*3/uL (ref 1.4–7.7)
Neutrophils Relative %: 70 % (ref 43.0–77.0)
Platelets: 221 10*3/uL (ref 150.0–400.0)
RBC: 5.63 Mil/uL (ref 4.22–5.81)
RDW: 13.6 % (ref 11.5–15.5)
WBC: 8.8 10*3/uL (ref 4.0–10.5)

## 2014-12-13 LAB — TSH: TSH: 1.71 u[IU]/mL (ref 0.35–4.50)

## 2014-12-13 LAB — HEMOGLOBIN A1C: Hgb A1c MFr Bld: 8.7 % — ABNORMAL HIGH (ref 4.6–6.5)

## 2014-12-13 MED ORDER — AZITHROMYCIN 250 MG PO TABS: ORAL_TABLET | ORAL | Status: DC

## 2014-12-13 MED ORDER — METHYLPREDNISOLONE ACETATE 80 MG/ML IJ SUSP
80.0000 mg | Freq: Once | INTRAMUSCULAR | Status: AC
  Administered 2014-12-13: 80 mg via INTRAMUSCULAR

## 2014-12-13 MED ORDER — SILDENAFIL CITRATE 100 MG PO TABS: 50.0000 mg | ORAL_TABLET | Freq: Every day | ORAL | Status: DC | PRN

## 2014-12-13 NOTE — Patient Instructions (Addendum)
Please take all new medication as prescribed -the antibiotic  You had the steroid shot today  Please continue all other medications as before, and refills have been done if requested.  Please have the pharmacy call with any other refills you may need.  Please continue your efforts at being more active, low cholesterol diet, and weight control.  You are otherwise up to date with prevention measures today.  Please keep your appointments with your specialists as you may have planned  Please go to the LAB in the Basement (turn left off the elevator) for the tests to be done today  You will be contacted by phone if any changes need to be made immediately.  Otherwise, you will receive a letter about your results with an explanation, but please check with MyChart first.  Please remember to sign up for MyChart if you have not done so, as this will be important to you in the future with finding out test results, communicating by private email, and scheduling acute appointments online when needed.  Please return in 6 months, or sooner if needed, with Lab testing done 3-5 days before

## 2014-12-13 NOTE — Progress Notes (Signed)
Pre visit review using our clinic review tool, if applicable. No additional management support is needed unless otherwise documented below in the visit note. 

## 2014-12-13 NOTE — Assessment & Plan Note (Signed)
Mild to mod, for antibx course,  to f/u any worsening symptoms or concerns 

## 2014-12-13 NOTE — Assessment & Plan Note (Signed)
Suspect dry skin or allergic related, depomedrol helped before, for repeat x 1 today

## 2014-12-13 NOTE — Assessment & Plan Note (Signed)

## 2014-12-13 NOTE — Progress Notes (Signed)
Subjective:    Patient ID: Keith Herrera, male    DOB: 01-23-1932, 81 y.o.   MRN: [id number]  HPI  Here for wellness and f/u;  Overall doing ok;  Pt denies CP, worsening SOB, DOE, wheezing, orthopnea, PND, worsening LE edema, palpitations, dizziness or syncope.  Pt denies neurological change such as new headache, facial or extremity weakness.  Pt denies polydipsia, polyuria, or low sugar symptoms. Pt states overall good compliance with treatment and medications, good tolerability, and has been trying to follow lower cholesterol diet.  Pt denies worsening depressive symptoms, suicidal ideation or panic. No fever, night sweats, wt loss, loss of appetite, or other constitutional symptoms.  Pt states good ability with ADL's, has low fall risk, home safety reviewed and adequate, no other significant changes in hearing or vision, and only occasionally active with exercise. Incidentally with recent uri, still stuffy now, some difficulty with deep breaths and itching, better in the past with depomedrol IM. Mentions by accident not taking his glipizide ER 5 mg qd recently. Has not been sleeping well due to the hacking cough, dry throat, tightness.  Asks for zostavax rx and viagra refill.  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 GERD (gastroesophageal reflux disease)   . Hiatal hernia   . Hemorrhoids   . Adenomatous polyp of colon 05/2004  . Diverticulosis     colon  . Hypertension   . Hyperlipidemia   . Diabetes type 2, controlled   . Anxiety   . CAD (coronary artery disease)     s/p Cypher DES to LAD and pD1 2010;  LHC was done 6/12: EF 65%, circumflex patent, mid RCA 80-90% (small and nondominant), LAD and diagonal stents patent, LAD at the origin of the first diagonal 30%, ostial D2 90%, mid 80-90% (small vessel).  Continued medical therapy was recommended.   . Right inguinal hernia     s/p repair in 8/12  . Esophageal  stricture   . Nephrolithiasis   . ED (erectile dysfunction)   . Allergic rhinitis   . IBS (irritable bowel syndrome)   . BPH (benign prostatic hyperplasia)   . Carotid stenosis     dopplers 02/2012: 4-27% RICA; 06-23% LICA   Past Surgical History  Procedure Laterality Date  . Coronary stent placement  10/2008    x 2  . Hernia repair      RIH with ultrapro patch    reports that he has quit smoking. He does not have any smokeless tobacco history on file. He reports that he does not drink alcohol or use illicit drugs. family history includes Cancer (age of onset: 69) in his mother; Cancer (age of onset: 18) in his sister. Allergies  Allergen Reactions  . Crestor [Rosuvastatin Calcium] Rash    All over body  . Lovastatin     REACTION: rash   Current Outpatient Prescriptions on File Prior to Visit  Medication Sig Dispense Refill  . amLODipine (NORVASC) 5 MG tablet TAKE 1 TABLET (5 MG TOTAL) BY MOUTH DAILY. 90 tablet 3  . aspirin 81 MG tablet Take 81 mg by mouth daily.      Marland Kitchen atorvastatin (LIPITOR) 10 MG tablet TAKE 1 TABLET EVERY DAY 90 tablet 2  . glucose blood test strip USE AS INSTRUCTED 250.02 check blood sugar twice daily    . lansoprazole (PREVACID) 30 MG capsule Take 1 capsule (30 mg total)  by mouth daily at 12 noon. 90 capsule 3  . losartan (COZAAR) 100 MG tablet TAKE 1 TABLET EVERY DAY 90 tablet 3  . metFORMIN (GLUCOPHAGE) 500 MG tablet TAKE 2 TABLETS BY MOUTH EVERY DAY 180 tablet 3  . ONE TOUCH LANCETS MISC Use as directed to check blood sugar twice daily.  Diagnosis code 250.00 200 each 11   No current facility-administered medications on file prior to visit.   Review of Systems Constitutional: Negative for increased diaphoresis, other activity, appetite or other siginficant weight change  HENT: Negative for worsening hearing loss, ear pain, facial swelling, mouth sores and neck stiffness.   Eyes: Negative for other worsening pain, redness or visual disturbance.    Respiratory: Negative for shortness of breath and wheezing.   Cardiovascular: Negative for chest pain and palpitations.  Gastrointestinal: Negative for diarrhea, blood in stool, abdominal distention or other pain Genitourinary: Negative for hematuria, flank pain or change in urine volume.  Musculoskeletal: Negative for myalgias or other joint complaints.  Skin: Negative for color change and wound.  Neurological: Negative for syncope and numbness. other than noted Hematological: Negative for adenopathy. or other swelling Psychiatric/Behavioral: Negative for hallucinations, self-injury, decreased concentration or other worsening agitation.      Objective:   Physical Exam BP 120/70 mmHg  Pulse 69  Temp(Src) 98.2 F (36.8 C) (Oral)  Ht 5\' 11"  (1.803 m)  Wt 167 lb 8 oz (75.978 kg)  BMI 23.37 kg/m2  SpO2 97% VS noted, mild ill Constitutional: Pt is oriented to person, place, and time. Appears well-developed and well-nourished.  Head: Normocephalic and atraumatic.  Right Ear: External ear normal.  Left Ear: External ear normal.  Nose: Nose normal.  Mouth/Throat: Oropharynx is clear and moist.  Bilat tm's with mild erythema.  Max sinus areas mild tender.  Pharynx with mild erythema, no exudate Eyes: Conjunctivae and EOM are normal. Pupils are equal, round, and reactive to light.  Neck: Normal range of motion. Neck supple. No JVD present. No tracheal deviation present.  Cardiovascular: Normal rate, regular rhythm, normal heart sounds and intact distal pulses.   Pulmonary/Chest: Effort normal and breath sounds without rales or wheezing  Abdominal: Soft. Bowel sounds are normal. NT. No HSM  Musculoskeletal: Normal range of motion. Exhibits no edema.  Lymphadenopathy:  Has no cervical adenopathy.  Neurological: Pt is alert and oriented to person, place, and time. Pt has normal reflexes. No cranial nerve deficit. Motor grossly intact Skin: Skin is warm and dry. No rash noted.  Psychiatric:   Has normal mood and affect. Behavior is normal.     Assessment & Plan:

## 2014-12-14 ENCOUNTER — Other Ambulatory Visit: Payer: Self-pay | Admitting: Internal Medicine

## 2014-12-14 LAB — LIPID PANEL
CHOLESTEROL: 149 mg/dL (ref 0–200)
HDL: 32.8 mg/dL — AB (ref >39.00)
NonHDL: 116.2
Total CHOL/HDL Ratio: 5
Triglycerides: 205 mg/dL — ABNORMAL HIGH (ref 0.0–149.0)
VLDL: 41 mg/dL — ABNORMAL HIGH (ref 0.0–40.0)

## 2014-12-14 LAB — BASIC METABOLIC PANEL
BUN: 24 mg/dL — AB (ref 6–23)
CALCIUM: 9.7 mg/dL (ref 8.4–10.5)
CO2: 24 mEq/L (ref 19–32)
CREATININE: 1.11 mg/dL (ref 0.40–1.50)
Chloride: 105 mEq/L (ref 96–112)
GFR: 67.62 mL/min (ref >60.00)
GLUCOSE: 137 mg/dL — AB (ref 70–99)
Potassium: 4.6 mEq/L (ref 3.5–5.1)
SODIUM: 141 meq/L (ref 135–145)

## 2014-12-14 LAB — HEPATIC FUNCTION PANEL
ALT: 23 U/L (ref 0–53)
AST: 19 U/L (ref 0–37)
Albumin: 4.3 g/dL (ref 3.5–5.2)
Alkaline Phosphatase: 49 U/L (ref 39–117)
BILIRUBIN TOTAL: 0.7 mg/dL (ref 0.2–1.2)
Bilirubin, Direct: 0.1 mg/dL (ref 0.0–0.3)
TOTAL PROTEIN: 7.1 g/dL (ref 6.0–8.3)

## 2014-12-14 LAB — MICROALBUMIN / CREATININE URINE RATIO
CREATININE, U: 399.8 mg/dL
MICROALB UR: 5 mg/dL — AB (ref 0.0–1.9)
Microalb Creat Ratio: 1.3 mg/g (ref 0.0–30.0)

## 2014-12-14 LAB — LDL CHOLESTEROL, DIRECT: Direct LDL: 102 mg/dL

## 2014-12-14 MED ORDER — METFORMIN HCL 500 MG PO TABS: 1000.0000 mg | ORAL_TABLET | Freq: Two times a day (BID) | ORAL | Status: DC

## 2014-12-20 ENCOUNTER — Ambulatory Visit: Payer: Commercial Managed Care - HMO | Admitting: Internal Medicine

## 2014-12-22 ENCOUNTER — Encounter: Payer: Self-pay | Admitting: Internal Medicine

## 2014-12-22 ENCOUNTER — Ambulatory Visit (INDEPENDENT_AMBULATORY_CARE_PROVIDER_SITE_OTHER): Payer: Commercial Managed Care - HMO | Admitting: Internal Medicine

## 2014-12-22 ENCOUNTER — Ambulatory Visit (INDEPENDENT_AMBULATORY_CARE_PROVIDER_SITE_OTHER)
Admission: RE | Admit: 2014-12-22 | Discharge: 2014-12-22 | Disposition: A | Discharge status: Home / Self Care | Payer: Commercial Managed Care - HMO | Source: Ambulatory Visit | Attending: Internal Medicine | Admitting: Internal Medicine

## 2014-12-22 VITALS — BP 120/72 | HR 68 | Temp 98.1°F | Ht 71.0 in | Wt 165.5 lb

## 2014-12-22 DIAGNOSIS — R05 Cough: Secondary | ICD-10-CM

## 2014-12-22 DIAGNOSIS — L299 Pruritus, unspecified: Secondary | ICD-10-CM

## 2014-12-22 DIAGNOSIS — R059 Cough, unspecified: Secondary | ICD-10-CM

## 2014-12-22 DIAGNOSIS — J449 Chronic obstructive pulmonary disease, unspecified: Secondary | ICD-10-CM | POA: Diagnosis not present

## 2014-12-22 DIAGNOSIS — I10 Essential (primary) hypertension: Secondary | ICD-10-CM | POA: Diagnosis not present

## 2014-12-22 MED ORDER — LEVOFLOXACIN 250 MG PO TABS: 250.0000 mg | ORAL_TABLET | Freq: Every day | ORAL | Status: DC

## 2014-12-22 MED ORDER — METHYLPREDNISOLONE ACETATE 80 MG/ML IJ SUSP
80.0000 mg | Freq: Once | INTRAMUSCULAR | Status: AC
  Administered 2014-12-22: 80 mg via INTRAMUSCULAR

## 2014-12-22 MED ORDER — CEFTRIAXONE SODIUM 1 G IJ SOLR
1.0000 g | Freq: Once | INTRAMUSCULAR | Status: AC
  Administered 2014-12-22: 1 g via INTRAMUSCULAR

## 2014-12-22 MED ORDER — HYDROCODONE-HOMATROPINE 5-1.5 MG/5ML PO SYRP
5.0000 mL | ORAL_SOLUTION | Freq: Four times a day (QID) | ORAL | Status: DC | PRN
Start: 2014-12-22 — End: 2015-02-08

## 2014-12-22 NOTE — Assessment & Plan Note (Signed)
With sob, right upper CP, and abnormal BS to the area - ? pna suboptimally recent tx with zpack -   For rocephin IM x1, levaquin asd, cough med prn, and cxr  - r/o pna

## 2014-12-22 NOTE — Progress Notes (Signed)
Subjective:    Patient ID: Keith Herrera, male    DOB: [birth year], 81 y.o.   MRN: [id number]  HPI  Here unfortuanately with c/o improved briefly but then worsened symptoms of scant prod cough, sob and new right upper chest pain ? Pleuritic (non exertional, nonpositional) with recent zpack tx.  Pt denies other chest pain, wheezing, orthopnea, PND, increased LE swelling, palpitations, dizziness or syncope. Never smoker, no hemoptysis.  Also with itching now with faint erythem rash off an on the past wk, no rash today, no angioedema swelling or lip/tongue swelling or wheezing. Past Medical History  Diagnosis Date  . GERD (gastroesophageal reflux disease)   . Hiatal hernia   . Hemorrhoids   . Adenomatous polyp of colon 05/2004  . Diverticulosis     colon  . Hypertension   . Hyperlipidemia   . Diabetes type 2, controlled   . Anxiety   . CAD (coronary artery disease)     s/p Cypher DES to LAD and pD1 2010;  LHC was done 6/12: EF 65%, circumflex patent, mid RCA 80-90% (small and nondominant), LAD and diagonal stents patent, LAD at the origin of the first diagonal 30%, ostial D2 90%, mid 80-90% (small vessel).  Continued medical therapy was recommended.   . Right inguinal hernia     s/p repair in 8/12  . Esophageal stricture   . Nephrolithiasis   . ED (erectile dysfunction)   . Allergic rhinitis   . IBS (irritable bowel syndrome)   . BPH (benign prostatic hyperplasia)   . Carotid stenosis     dopplers 02/2012: 2-53% RICA; 66-44% LICA   Past Surgical History  Procedure Laterality Date  . Coronary stent placement  10/2008    x 2  . Hernia repair      RIH with ultrapro patch    reports that he has quit smoking. He does not have any smokeless tobacco history on file. He reports that he does not drink alcohol or use illicit drugs. family history includes Cancer (age of onset: 41) in his mother; Cancer (age of onset: 72) in his sister. Allergies  Allergen Reactions  . Crestor  [Rosuvastatin Calcium] Rash    All over body  . Lovastatin     REACTION: rash   Current Outpatient Prescriptions on File Prior to Visit  Medication Sig Dispense Refill  . amLODipine (NORVASC) 5 MG tablet TAKE 1 TABLET (5 MG TOTAL) BY MOUTH DAILY. 90 tablet 3  . aspirin 81 MG tablet Take 81 mg by mouth daily.      Marland Kitchen atorvastatin (LIPITOR) 10 MG tablet TAKE 1 TABLET EVERY DAY 90 tablet 2  . glucose blood test strip USE AS INSTRUCTED 250.02 check blood sugar twice daily    . lansoprazole (PREVACID) 30 MG capsule Take 1 capsule (30 mg total) by mouth daily at 12 noon. 90 capsule 3  . losartan (COZAAR) 100 MG tablet TAKE 1 TABLET EVERY DAY 90 tablet 3  . metFORMIN (GLUCOPHAGE) 500 MG tablet Take 2 tablets (1,000 mg total) by mouth 2 (two) times daily with a meal. 360 tablet 11  . ONE TOUCH LANCETS MISC Use as directed to check blood sugar twice daily.  Diagnosis code 250.00 200 each 11  . sildenafil (VIAGRA) 100 MG tablet Take 0.5-1 tablets (50-100 mg total) by mouth daily as needed for erectile dysfunction. 5 tablet 11   No current facility-administered medications on file prior to visit.   Review of Systems  Constitutional: Negative for unusual  diaphoresis or other sweats  HENT: Negative for ringing in ear Eyes: Negative for double vision or worsening visual disturbance.  Respiratory: Negative for choking and stridor.   Gastrointestinal: Negative for vomiting or other signifcant bowel change Genitourinary: Negative for hematuria or decreased urine volume.  Musculoskeletal: Negative for other MSK pain or swelling Skin: Negative for color change and worsening wound.  Neurological: Negative for tremors and numbness other than noted  Psychiatric/Behavioral: Negative for decreased concentration or agitation other than above       Objective:   Physical Exam BP 120/72 mmHg  Pulse 68  Temp(Src) 98.1 F (36.7 C) (Oral)  Ht 5\' 11"  (1.803 m)  Wt 165 lb 8 oz (75.07 kg)  BMI 23.09 kg/m2   SpO2 97% VS noted, mild ill appearing Constitutional: Pt appears well-developed, well-nourished.  HENT: Head: NCAT.  Right Ear: External ear normal.  Left Ear: External ear normal.  Eyes: . Pupils are equal, round, and reactive to light. Conjunctivae and EOM are normal Pharynx  Minimal erythema Neck: Normal range of motion. Neck supple.  Cardiovascular: Normal rate and regular rhythm.   Pulmonary/Chest: Effort normal but breath sounds reduced right upper lung field without overt rales or wheezing.  Abd:  Soft, NT, ND, + BS Neurological: Pt is alert. Not confused , motor grossly intact Skin: Skin is warm. No rash today, but scratching all over Psychiatric: Pt behavior is normal. No agitation. minimal nervous     Assessment & Plan:

## 2014-12-22 NOTE — Assessment & Plan Note (Signed)
?   Allergic rash, ok for depomedrol IM 80 mg x 1, zyrtec qd prn, consider allergy testing

## 2014-12-22 NOTE — Assessment & Plan Note (Signed)
stable overall by history and exam, recent data reviewed with pt, and pt to continue medical treatment as before,  to f/u any worsening symptoms or concerns BP Readings from Last 3 Encounters:  12/22/14 120/72  12/13/14 120/70  07/11/14 155/68

## 2014-12-22 NOTE — Progress Notes (Signed)
Pre visit review using our clinic review tool, if applicable. No additional management support is needed unless otherwise documented below in the visit note. 

## 2014-12-22 NOTE — Patient Instructions (Signed)
You had the antibiotic, and steroid shot today  Please take all new medication as prescribed - the antibiotic pills, and cough med as needed  Please go to the XRAY Department in the Basement (go straight as you get off the elevator) for the x-ray testing  You will be contacted by phone if any changes need to be made immediately.  Otherwise, you will receive a letter about your results with an explanation, but please check with MyChart first.  Please continue all other medications as before, and refills have been done if requested.  Please have the pharmacy call with any other refills you may need.  Please keep your appointments with your specialists as you may have planned

## 2015-01-30 ENCOUNTER — Encounter (HOSPITAL_COMMUNITY): Payer: Self-pay | Admitting: Emergency Medicine

## 2015-01-30 ENCOUNTER — Inpatient Hospital Stay (HOSPITAL_COMMUNITY)
Admission: EM | Admit: 2015-01-30 | Discharge: 2015-01-31 | DRG: 309 | Disposition: A | Discharge status: Home / Self Care | Payer: Commercial Managed Care - HMO | Attending: Cardiovascular Disease | Admitting: Cardiovascular Disease

## 2015-01-30 ENCOUNTER — Emergency Department (HOSPITAL_COMMUNITY): Payer: Commercial Managed Care - HMO

## 2015-01-30 ENCOUNTER — Other Ambulatory Visit: Payer: Self-pay | Admitting: Internal Medicine

## 2015-01-30 DIAGNOSIS — I10 Essential (primary) hypertension: Secondary | ICD-10-CM | POA: Diagnosis not present

## 2015-01-30 DIAGNOSIS — Z955 Presence of coronary angioplasty implant and graft: Secondary | ICD-10-CM | POA: Diagnosis not present

## 2015-01-30 DIAGNOSIS — B370 Candidal stomatitis: Secondary | ICD-10-CM | POA: Diagnosis not present

## 2015-01-30 DIAGNOSIS — K219 Gastro-esophageal reflux disease without esophagitis: Secondary | ICD-10-CM | POA: Diagnosis present

## 2015-01-30 DIAGNOSIS — N400 Enlarged prostate without lower urinary tract symptoms: Secondary | ICD-10-CM | POA: Diagnosis present

## 2015-01-30 DIAGNOSIS — F419 Anxiety disorder, unspecified: Secondary | ICD-10-CM | POA: Diagnosis present

## 2015-01-30 DIAGNOSIS — R55 Syncope and collapse: Secondary | ICD-10-CM | POA: Diagnosis not present

## 2015-01-30 DIAGNOSIS — R110 Nausea: Secondary | ICD-10-CM | POA: Diagnosis not present

## 2015-01-30 DIAGNOSIS — I471 Supraventricular tachycardia, unspecified: Secondary | ICD-10-CM | POA: Diagnosis present

## 2015-01-30 DIAGNOSIS — R5383 Other fatigue: Secondary | ICD-10-CM | POA: Diagnosis not present

## 2015-01-30 DIAGNOSIS — I2510 Atherosclerotic heart disease of native coronary artery without angina pectoris: Secondary | ICD-10-CM | POA: Diagnosis not present

## 2015-01-30 DIAGNOSIS — I4892 Unspecified atrial flutter: Secondary | ICD-10-CM | POA: Diagnosis not present

## 2015-01-30 DIAGNOSIS — E119 Type 2 diabetes mellitus without complications: Secondary | ICD-10-CM | POA: Diagnosis not present

## 2015-01-30 DIAGNOSIS — Z79899 Other long term (current) drug therapy: Secondary | ICD-10-CM

## 2015-01-30 DIAGNOSIS — R002 Palpitations: Secondary | ICD-10-CM | POA: Diagnosis not present

## 2015-01-30 DIAGNOSIS — E785 Hyperlipidemia, unspecified: Secondary | ICD-10-CM | POA: Diagnosis not present

## 2015-01-30 DIAGNOSIS — R404 Transient alteration of awareness: Secondary | ICD-10-CM | POA: Diagnosis not present

## 2015-01-30 DIAGNOSIS — E1159 Type 2 diabetes mellitus with other circulatory complications: Secondary | ICD-10-CM

## 2015-01-30 DIAGNOSIS — Z87891 Personal history of nicotine dependence: Secondary | ICD-10-CM | POA: Diagnosis not present

## 2015-01-30 DIAGNOSIS — R42 Dizziness and giddiness: Secondary | ICD-10-CM | POA: Diagnosis not present

## 2015-01-30 DIAGNOSIS — Z7982 Long term (current) use of aspirin: Secondary | ICD-10-CM

## 2015-01-30 DIAGNOSIS — R000 Tachycardia, unspecified: Secondary | ICD-10-CM | POA: Diagnosis not present

## 2015-01-30 LAB — CBC
HCT: 42.7 % (ref 39.0–52.0)
Hemoglobin: 14.8 g/dL (ref 13.0–17.0)
MCH: 30.1 pg (ref 26.0–34.0)
MCHC: 34.7 g/dL (ref 30.0–36.0)
MCV: 87 fL (ref 78.0–100.0)
Platelets: 145 10*3/uL — ABNORMAL LOW (ref 150–400)
RBC: 4.91 MIL/uL (ref 4.22–5.81)
RDW: 13.5 % (ref 11.5–15.5)
WBC: 8.4 10*3/uL (ref 4.0–10.5)

## 2015-01-30 LAB — BASIC METABOLIC PANEL
Anion gap: 11 (ref 5–15)
BUN: 22 mg/dL (ref 6–23)
CO2: 23 mmol/L (ref 19–32)
CREATININE: 0.98 mg/dL (ref 0.50–1.35)
Calcium: 8.8 mg/dL (ref 8.4–10.5)
Chloride: 105 mmol/L (ref 96–112)
GFR calc Af Amer: 88 mL/min — ABNORMAL LOW (ref >90)
GFR calc non Af Amer: 76 mL/min — ABNORMAL LOW (ref >90)
GLUCOSE: 250 mg/dL — AB (ref 70–99)
Potassium: 4.3 mmol/L (ref 3.5–5.1)
Sodium: 139 mmol/L (ref 135–145)

## 2015-01-30 LAB — I-STAT TROPONIN, ED: Troponin i, poc: 0 ng/mL (ref 0.00–0.08)

## 2015-01-30 NOTE — ED Notes (Signed)
Collected urine sample from urinal, placed at bedside.

## 2015-01-30 NOTE — ED Notes (Signed)
Report given to Nicky Pugh, RN

## 2015-01-30 NOTE — ED Notes (Signed)
Pt placed on zoll monitor

## 2015-01-30 NOTE — ED Notes (Signed)
Pt in NSR at this time.  No complaints voiced by pt.  Family remains at bedside.

## 2015-01-30 NOTE — H&P (Signed)
Chief Complaint:  Recurrent near syncope  HPI:  This is a 81 y.o. male with a past medical history significant for coronary artery disease and has undergone stenting of the LAD and diagonal branches in 2010. His coronary artery disease was identified by an abnormal stress Myoview scan. The patient was asymptomatic at the time of his initial presentation. His most recent cardiac catheterization was in 2012 and demonstrated patency of his LAD and diagonal stents. He has some small vessel disease involving his diagonal branches and a nondominant RCA. He has normal LV function.  Last summer he had palpitations and near syncope. He subsequently had a normal stress nuclear perfusion study in August 2015. He presents with similar complaints today. Earlier he had lifted 30 lb fertilizer bags without any difficulty. He was walking down the driveway when he felt he might pass out. He had nausea. Near syncope and palpitations resolved, but he is still a little nauseous. He denies vertigo or frank vomiting.  While in the ED he has very frequent PACs and short but sustained bursts of SVT - heart rates up to 210 bpm. The only treatment received was IV fluids. The frequency and length of the SVT episodes has gradually diminished while in the ED and he now only has isolated PACs.  PMHx:  Past Medical History  Diagnosis Date  . GERD (gastroesophageal reflux disease)   . Hiatal hernia   . Hemorrhoids   . Adenomatous polyp of colon 05/2004  . Diverticulosis     colon  . Hypertension   . Hyperlipidemia   . Diabetes type 2, controlled   . Anxiety   . CAD (coronary artery disease)     s/p Cypher DES to LAD and pD1 2010;  LHC was done 6/12: EF 65%, circumflex patent, mid RCA 80-90% (small and nondominant), LAD and diagonal stents patent, LAD at the origin of the first diagonal 30%, ostial D2 90%, mid 80-90% (small vessel).  Continued medical therapy was recommended.   . Right inguinal hernia     s/p repair in  8/12  . Esophageal stricture   . Nephrolithiasis   . ED (erectile dysfunction)   . Allergic rhinitis   . IBS (irritable bowel syndrome)   . BPH (benign prostatic hyperplasia)   . Carotid stenosis     dopplers 02/2012: 1-61% RICA; 09-60% LICA    Past Surgical History  Procedure Laterality Date  . Coronary stent placement  10/2008    x 2  . Hernia repair      RIH with ultrapro patch    FAMHx:  Family History  Problem Relation Age of Onset  . Cancer Mother 12    unknown cancer  . Cancer Sister 78    unknown cancer    SOCHx:   reports that he has quit smoking. He does not have any smokeless tobacco history on file. He reports that he does not drink alcohol or use illicit drugs.  ALLERGIES:  Allergies  Allergen Reactions  . Crestor [Rosuvastatin Calcium] Rash    All over body  . Lovastatin Itching and Rash    ROS: Constitutional: positive for fatigue and sweats, negative for anorexia, chills, fevers and weight loss Eyes: negative Ears, nose, mouth, throat, and face: negative Respiratory: positive for cough, negative for dyspnea on exertion, hemoptysis, sputum and wheezing Cardiovascular: positive for near-syncope and palpitations, negative for dyspnea, exertional chest pressure/discomfort, orthopnea and paroxysmal nocturnal dyspnea Gastrointestinal: positive for nausea, negative for abdominal pain, change in bowel habits,  dysphagia and vomiting Genitourinary:negative Integument/breast: positive for pruritus and rash Hematologic/lymphatic: negative for bleeding and petechiae Musculoskeletal:positive for arthralgias Neurological: negative Behavioral/Psych: negative Endocrine: negative for diabetic symptoms including polydipsia, polyuria, poor wound healing and weight loss Allergic/Immunologic: negative, pruritic rash on flanks  HOME MEDS:  (Not in a hospital admission)  LABS/IMAGING: Results for orders placed or performed during the hospital encounter of 01/30/15  (from the past 48 hour(s))  Basic metabolic panel     Status: Abnormal   Collection Time: 01/30/15  5:30 PM  Result Value Ref Range   Sodium 139 135 - 145 mmol/L   Potassium 4.3 3.5 - 5.1 mmol/L   Chloride 105 96 - 112 mmol/L   CO2 23 19 - 32 mmol/L   Glucose, Bld 250 (H) 70 - 99 mg/dL   BUN 22 6 - 23 mg/dL   Creatinine, Ser 0.98 0.50 - 1.35 mg/dL   Calcium 8.8 8.4 - 10.5 mg/dL   GFR calc non Af Amer 76 (L) >90 mL/min   GFR calc Af Amer 88 (L) >90 mL/min    Comment: (NOTE) The eGFR has been calculated using the CKD EPI equation. This calculation has not been validated in all clinical situations. eGFR's persistently <90 mL/min signify possible Chronic Kidney Disease.    Anion gap 11 5 - 15  CBC     Status: Abnormal   Collection Time: 01/30/15  5:30 PM  Result Value Ref Range   WBC 8.4 4.0 - 10.5 K/uL   RBC 4.91 4.22 - 5.81 MIL/uL   Hemoglobin 14.8 13.0 - 17.0 g/dL   HCT 42.7 39.0 - 52.0 %   MCV 87.0 78.0 - 100.0 fL   MCH 30.1 26.0 - 34.0 pg   MCHC 34.7 30.0 - 36.0 g/dL   RDW 13.5 11.5 - 15.5 %   Platelets 145 (L) 150 - 400 K/uL  I-stat troponin, ED     Status: None   Collection Time: 01/30/15  5:36 PM  Result Value Ref Range   Troponin i, poc 0.00 0.00 - 0.08 ng/mL   Comment 3            Comment: Due to the release kinetics of cTnI, a negative result within the first hours of the onset of symptoms does not rule out myocardial infarction with certainty. If myocardial infarction is still suspected, repeat the test at appropriate intervals.    Dg Chest Port 1 View  01/30/2015   CLINICAL DATA:  Initial evaluation for syncope, nausea, intermittent supraventricular tachycardia  EXAM: PORTABLE CHEST - 1 VIEW  COMPARISON:  12/22/2014  FINDINGS: The heart size and vascular pattern are normal. Aortic arch calcification stable. Mild hypoventilatory change at the lung bases. Old right clavicular fracture.  IMPRESSION: No active disease.   Electronically Signed   By: Skipper Cliche  M.D.   On: 01/30/2015 17:30    VITALS: Blood pressure 141/69, temperature 98.6 F (37 C), temperature source Oral, resp. rate 11, height _0  (1.803 m), weight 165 lb (74.844 kg), SpO2 94 %.  EXAM:  General: Alert, oriented x3, no distress Head: no evidence of trauma, PERRL, EOMI, no exophtalmos or lid lag, no myxedema, no xanthelasma; normal ears, nose and oropharynx Neck: normal jugular venous pulsations and no hepatojugular reflux; brisk carotid pulses without delay and no carotid bruits Chest: clear to auscultation, no signs of consolidation by percussion or palpation, normal fremitus, symmetrical and full respiratory excursions Cardiovascular: normal position and quality of the apical impulse, regular rhythm, normal first  heart sound and normal second heart sounds, no rubs or gallops, no murmur Abdomen: no tenderness or distention, no masses by palpation, no abnormal pulsatility or arterial bruits, normal bowel sounds, no hepatosplenomegaly Extremities: no clubbing, cyanosis or edema; 2+ radial, ulnar and brachial pulses bilaterally; 2+ right femoral, posterior tibial and dorsalis pedis pulses; 2+ left femoral, posterior tibial and dorsalis pedis pulses; no subclavian or femoral bruits Neurological: grossly nonfocal  TELEMETRY: Recurrent narrow complex long RP tachycardia at >200 bpm  ECG: SVT to NSR, minor diffuse ST depression  CXR: No signs CHF, normal heart size  IMPRESSION: Recurrent near syncope due to very rapid narrow complex tachycardia (ectopic atrial tachycardia or atrial flutter with 1:1 conduction?) History of CAD, currently asymptomatic with recent normal perfusion study. HTN Hyperlipidemia   PLAN: Admit to telemetry. Start beta blocker. Stop amlodipine if low BP becomes an issue. Consider EP evaluation.  Sanda Klein, MD, [facility] [phone] office [phone] pager  01/30/2015, 6:40 PM

## 2015-01-30 NOTE — ED Notes (Signed)
Cardiologist at bedside.  

## 2015-01-30 NOTE — ED Notes (Signed)
Pt to ED via [facility].  EMS reports pt was outside working in the yard when he became lightheaded and nauseated.  On EMS arrival pt's cardiac rhythm was SVT.  Pt was given Adenosine 6mg  and 12mg .  Pt converted but continues to go in and out of it.  Pt st's he gets lightheaded when heart rate increases but denies any chest pain.  Pt alert and oriented x's 3.

## 2015-01-30 NOTE — ED Provider Notes (Signed)
CSN: [id number]     Arrival date & time 01/30/15  1636 History   First MD Initiated Contact with Patient 01/30/15 1707     Chief Complaint  Patient presents with  . Tachycardia     (Consider location/radiation/quality/duration/timing/severity/associated sxs/prior Treatment) HPI Comments: Patient with a history of coronary artery disease status post stent placement, diabetes mellitus and hypertension presents with dizziness. He was brought in by walking him [employer]. He was working out in the yard and felt dizzy and lightheaded and felt like he might pass out. He's been feeling some fluttering in his chest. He states that he has been having these intermittent symptoms throughout the afternoon. He was noted to be in SVT by EMS and was given adenosine 6 mg and 12 mg IV push. He's continuing to have intermittent episodes of PSVT in the ED. He's had a recent cough with some chest congestion but no vomiting diarrhea fevers or other recent illnesses. His been taking some prescription cough syrup which was Hycodan. He denies taking other over-the-counter medicines.   Past Medical History  Diagnosis Date  . GERD (gastroesophageal reflux disease)   . Hiatal hernia   . Hemorrhoids   . Adenomatous polyp of colon 05/2004  . Diverticulosis     colon  . Hypertension   . Hyperlipidemia   . Diabetes type 2, controlled   . Anxiety   . CAD (coronary artery disease)     s/p Cypher DES to LAD and pD1 2010;  LHC was done 6/12: EF 65%, circumflex patent, mid RCA 80-90% (small and nondominant), LAD and diagonal stents patent, LAD at the origin of the first diagonal 30%, ostial D2 90%, mid 80-90% (small vessel).  Continued medical therapy was recommended.   . Right inguinal hernia     s/p repair in 8/12  . Esophageal stricture   . Nephrolithiasis   . ED (erectile dysfunction)   . Allergic rhinitis   . IBS (irritable bowel syndrome)   . BPH (benign prostatic hyperplasia)   . Carotid stenosis     dopplers  02/2012: 2-13% RICA; 08-65% LICA   Past Surgical History  Procedure Laterality Date  . Coronary stent placement  10/2008    x 2  . Hernia repair      RIH with ultrapro patch   Family History  Problem Relation Age of Onset  . Cancer Mother 56    unknown cancer  . Cancer Sister 17    unknown cancer   History  Substance Use Topics  . Smoking status: Former Research scientist (life sciences)  . Smokeless tobacco: Not on file  . Alcohol Use: No    Review of Systems  Constitutional: Positive for diaphoresis and fatigue. Negative for fever and chills.  HENT: Negative for congestion, rhinorrhea and sneezing.   Eyes: Negative.   Respiratory: Positive for cough. Negative for chest tightness and shortness of breath.   Cardiovascular: Positive for palpitations. Negative for chest pain and leg swelling.  Gastrointestinal: Positive for nausea. Negative for vomiting, abdominal pain, diarrhea and blood in stool.  Genitourinary: Negative for frequency, hematuria, flank pain and difficulty urinating.  Musculoskeletal: Negative for back pain and arthralgias.  Skin: Negative for rash.  Neurological: Positive for light-headedness. Negative for dizziness, speech difficulty, weakness, numbness and headaches.      Allergies  Crestor and Lovastatin  Home Medications   Prior to Admission medications   Medication Sig Start Date End Date Taking? Authorizing Provider  amLODipine (NORVASC) 5 MG tablet TAKE 1 TABLET (5  MG TOTAL) BY MOUTH DAILY. 12/12/14  Yes [name], MD  aspirin 81 MG tablet Take 81 mg by mouth every evening.    Yes Historical Provider, MD  atorvastatin (LIPITOR) 10 MG tablet TAKE 1 TABLET EVERY DAY 06/15/14  Yes [name], MD  HYDROcodone-homatropine [facility]) 5-1.5 MG/5ML syrup Take 5 mLs by mouth every 6 (six) hours as needed for cough. 12/22/14  Yes [name], MD  lansoprazole (PREVACID) 30 MG capsule Take 1 capsule (30 mg total) by mouth daily at 12 noon. 08/16/14  Yes [name], MD  losartan  (COZAAR) 100 MG tablet TAKE 1 TABLET EVERY DAY 03/15/14  Yes [name], MD  metFORMIN (GLUCOPHAGE) 500 MG tablet Take 2 tablets (1,000 mg total) by mouth 2 (two) times daily with a meal. 12/14/14  Yes [name], MD  sildenafil (VIAGRA) 100 MG tablet Take 0.5-1 tablets (50-100 mg total) by mouth daily as needed for erectile dysfunction. 12/13/14  Yes [name], MD  doxycycline (VIBRA-TABS) 100 MG tablet Take 1 tablet (100 mg total) by mouth 2 (two) times daily. 02/02/15   [name], MD  levocetirizine (XYZAL) 5 MG tablet Take 1 tablet (5 mg total) by mouth every evening. 02/02/15   [name], MD  meclizine (ANTIVERT) 12.5 MG tablet Take 1 tablet (12.5 mg total) by mouth 3 (three) times daily as needed for dizziness. 02/02/15   [name], MD  metoprolol tartrate (LOPRESSOR) 25 MG tablet Take 0.5 tablets (12.5 mg total) by mouth 2 (two) times daily. 01/31/15   Rhonda G Barrett, PA-C  nystatin (MYCOSTATIN) 100000 UNIT/ML suspension Take 5 mLs (500,000 Units total) by mouth 4 (four) times daily. 01/31/15   Rhonda G Barrett, PA-C  ONE TOUCH LANCETS MISC Use as directed to check blood sugar twice daily.  Diagnosis code 250.00 12/17/13   [name], MD  predniSONE (DELTASONE) 10 MG tablet 3 tabs by mouth per day for 3 days,2tabs per day for 3 days,1tab per day for 3 days 02/02/15   [name], MD   BP 156/71 mmHg  Pulse 61  Temp(Src) 98.6 F (37 C) (Oral)  Resp 20  Ht 5\' 11"  (1.803 m)  Wt 165 lb (74.844 kg)  BMI 23.02 kg/m2  SpO2 95% Physical Exam  Constitutional: He is oriented to person, place, and time. He appears well-developed and well-nourished.  HENT:  Head: Normocephalic and atraumatic.  Eyes: Pupils are equal, round, and reactive to light.  Neck: Normal range of motion. Neck supple.  Cardiovascular: Normal rate, regular rhythm and normal heart sounds.   Pulmonary/Chest: Effort normal and breath sounds normal. No respiratory distress. He has no wheezes. He has no rales. He  exhibits no tenderness.  Abdominal: Soft. Bowel sounds are normal. There is no tenderness. There is no rebound and no guarding.  Musculoskeletal: Normal range of motion. He exhibits no edema.  No calf tenderness  Lymphadenopathy:    He has no cervical adenopathy.  Neurological: He is alert and oriented to person, place, and time.  Skin: Skin is warm and dry. No rash noted.  Psychiatric: He has a normal mood and affect.    ED Course  Procedures (including critical care time) Labs Review Labs Reviewed  BASIC METABOLIC PANEL - Abnormal; Notable for the following:    Glucose, Bld 250 (*)    GFR calc non Af Amer 76 (*)    GFR calc Af Amer 88 (*)  All other components within normal limits  CBC - Abnormal; Notable for the following:    Platelets 145 (*)    All other components within normal limits  I-STAT TROPOININ, ED    Imaging Review No results found.   EKG Interpretation   Date/Time:  Monday January 30 2015 17:09:40 EDT Ventricular Rate:  186 PR Interval:  101 QRS Duration: 71 QT Interval:  299 QTC Calculation: 526 R Axis:   96 Text Interpretation:  Supraventricular tachycardia Ventricular  tachycardia, unsustained Right axis deviation ST depression, probably rate  related Artifact in lead(s) I III aVR aVL aVF V1 V4 V5 V6 Confirmed by  Al Bracewell  MD, Deaire Mcwhirter ([id number]) on 01/30/2015 6:15:43 PM      MDM   Final diagnoses:  SVT (supraventricular tachycardia)    PT presents with intermittent short lived runs of PSVT, this converts rapidly to a sinus rhythm with frequent PACs.  Cardiology has seen the pt and will admit for monitoring.    Malvin Johns, MD 02/02/15 [phone]

## 2015-01-30 NOTE — ED Notes (Signed)
Spoke to [facility] for bed request orders; cardiology to place bed placement; cards paged by secretary

## 2015-01-30 NOTE — ED Notes (Signed)
Pt given [place] sandwich to eat; wife at bedside; denies pain at this time. NSR noted

## 2015-01-31 ENCOUNTER — Ambulatory Visit (HOSPITAL_COMMUNITY): Admit: 2015-01-31 | Payer: Self-pay | Admitting: Cardiovascular Disease

## 2015-01-31 ENCOUNTER — Encounter (HOSPITAL_COMMUNITY): Payer: Self-pay

## 2015-01-31 ENCOUNTER — Other Ambulatory Visit: Payer: Self-pay | Admitting: Physician Assistant

## 2015-01-31 DIAGNOSIS — E1159 Type 2 diabetes mellitus with other circulatory complications: Secondary | ICD-10-CM

## 2015-01-31 DIAGNOSIS — B370 Candidal stomatitis: Secondary | ICD-10-CM | POA: Diagnosis present

## 2015-01-31 DIAGNOSIS — R55 Syncope and collapse: Secondary | ICD-10-CM

## 2015-01-31 DIAGNOSIS — I471 Supraventricular tachycardia: Secondary | ICD-10-CM | POA: Diagnosis present

## 2015-01-31 SURGERY — LOOP RECORDER IMPLANT

## 2015-01-31 MED ORDER — LOSARTAN POTASSIUM 50 MG PO TABS
100.0000 mg | ORAL_TABLET | Freq: Every day | ORAL | Status: DC
  Administered 2015-01-31: 100 mg via ORAL
  Filled 2015-01-31: qty 2

## 2015-01-31 MED ORDER — SODIUM CHLORIDE 0.9 % IJ SOLN
3.0000 mL | INTRAMUSCULAR | Status: DC | PRN
Start: 2015-01-31 — End: 2015-01-31

## 2015-01-31 MED ORDER — HYDROCODONE-HOMATROPINE 5-1.5 MG/5ML PO SYRP: 5.0000 mL | ORAL_SOLUTION | Freq: Four times a day (QID) | ORAL | Status: DC | PRN

## 2015-01-31 MED ORDER — METOPROLOL TARTRATE 25 MG PO TABS: 12.5000 mg | ORAL_TABLET | Freq: Two times a day (BID) | ORAL | Status: DC

## 2015-01-31 MED ORDER — METOPROLOL TARTRATE 25 MG PO TABS
12.5000 mg | ORAL_TABLET | Freq: Two times a day (BID) | ORAL | Status: DC
  Administered 2015-01-31: 12.5 mg via ORAL
  Filled 2015-01-31: qty 1

## 2015-01-31 MED ORDER — PANTOPRAZOLE SODIUM 40 MG PO TBEC
40.0000 mg | DELAYED_RELEASE_TABLET | Freq: Every day | ORAL | Status: DC
  Administered 2015-01-31: 40 mg via ORAL
  Filled 2015-01-31: qty 1

## 2015-01-31 MED ORDER — AMLODIPINE BESYLATE 5 MG PO TABS
5.0000 mg | ORAL_TABLET | Freq: Every day | ORAL | Status: DC
  Administered 2015-01-31: 5 mg via ORAL
  Filled 2015-01-31: qty 1

## 2015-01-31 MED ORDER — METFORMIN HCL 500 MG PO TABS
1000.0000 mg | ORAL_TABLET | Freq: Two times a day (BID) | ORAL | Status: DC
  Administered 2015-01-31: 1000 mg via ORAL
  Filled 2015-01-31: qty 2

## 2015-01-31 MED ORDER — SODIUM CHLORIDE 0.9 % IJ SOLN
3.0000 mL | Freq: Two times a day (BID) | INTRAMUSCULAR | Status: DC
  Administered 2015-01-31: 3 mL via INTRAVENOUS
  Filled 2015-01-31: qty 3

## 2015-01-31 MED ORDER — NYSTATIN 100000 UNIT/ML MT SUSP: 5.0000 mL | Freq: Four times a day (QID) | OROMUCOSAL | Status: DC

## 2015-01-31 MED ORDER — ATORVASTATIN CALCIUM 10 MG PO TABS
10.0000 mg | ORAL_TABLET | Freq: Every day | ORAL | Status: DC
  Filled 2015-01-31: qty 1

## 2015-01-31 MED ORDER — SODIUM CHLORIDE 0.9 % IV SOLN: 250.0000 mL | INTRAVENOUS | Status: DC | PRN

## 2015-01-31 MED ORDER — ASPIRIN EC 81 MG PO TBEC: 81.0000 mg | DELAYED_RELEASE_TABLET | Freq: Every evening | ORAL | Status: DC

## 2015-01-31 MED ORDER — NYSTATIN 100000 UNIT/ML MT SUSP
5.0000 mL | Freq: Four times a day (QID) | OROMUCOSAL | Status: DC
  Filled 2015-01-31 (×3): qty 5

## 2015-01-31 NOTE — Discharge Summary (Signed)
CARDIOLOGY DISCHARGE SUMMARY   Patient ID: Keith Herrera MRN: [id number] DOB/AGE: 12/31/1931 81 y.o.  Admit date: 01/30/2015 Discharge date: 02/02/2015  PCP: Cathlean Cower, MD Primary Cardiologist: Dr. Burt Knack  Primary Discharge Diagnosis:  Near syncope Secondary Discharge Diagnosis:    SVT (supraventricular tachycardia)   Diabetes mellitus type 2, noninsulin dependent   Thrush, oral  Procedures: Chest x-ray  Hospital Course: Keith Herrera is a 81 y.o. male with a history of CAD, but no arrhythmia. He has normal LV function. He had near syncope and came to the ER where he was admitted for further evaluation and treatment.  He was held overnight. Bursts of SVT were seen. He was started on a beta blocker and tolerated this well. He was bradycardic at times with a heart rate in the high 40s, but was asymptomatic with this. A TSH had been normal in January and was not rechecked. He had some elevated blood glucoses, but not significantly high or low enough to account for his symptoms.  On 03/15, he was seen by Dr. Tamala Julian and all data were reviewed. He was tolerating low-dose metoprolol. He was noted to have oral thrush and was started on Nystatin mouthwash. He ambulated and did well with this. He was advised not to drive for now. No further inpatient workup was indicated and he was considered stable for discharge, to follow up as an outpatient.  Labs:   Lab Results  Component Value Date   WBC 8.4 01/30/2015   HGB 14.8 01/30/2015   HCT 42.7 01/30/2015   MCV 87.0 01/30/2015   PLT 145* 01/30/2015     Recent Labs Lab 01/30/15 1730  NA 139  K 4.3  CL 105  CO2 23  BUN 22  CREATININE 0.98  CALCIUM 8.8  GLUCOSE 250*   Lab Results  Component Value Date   TSH 1.71 12/13/2014    Radiology: Dg Chest Port 1 View 01/30/2015   CLINICAL DATA:  Initial evaluation for syncope, nausea, intermittent supraventricular tachycardia  EXAM: PORTABLE CHEST - 1 VIEW  COMPARISON:   12/22/2014  FINDINGS: The heart size and vascular pattern are normal. Aortic arch calcification stable. Mild hypoventilatory change at the lung bases. Old right clavicular fracture.  IMPRESSION: No active disease.   Electronically Signed   By: Skipper Cliche M.D.   On: 01/30/2015 17:30    EKG: Sinus rhythm, patient went into SVT during 1 ECG.   FOLLOW UP PLANS AND APPOINTMENTS Allergies  Allergen Reactions  . Crestor [Rosuvastatin Calcium] Rash    All over body  . Lovastatin Itching and Rash     Medication List    TAKE these medications        amLODipine 5 MG tablet  Commonly known as:  NORVASC  TAKE 1 TABLET (5 MG TOTAL) BY MOUTH DAILY.     aspirin 81 MG tablet  Take 81 mg by mouth every evening.     atorvastatin 10 MG tablet  Commonly known as:  LIPITOR  TAKE 1 TABLET EVERY DAY     HYDROcodone-homatropine 5-1.5 MG/5ML syrup  Commonly known as:  HYCODAN  Take 5 mLs by mouth every 6 (six) hours as needed for cough.     lansoprazole 30 MG capsule  Commonly known as:  PREVACID  Take 1 capsule (30 mg total) by mouth daily at 12 noon.     levofloxacin 250 MG tablet  Commonly known as:  LEVAQUIN  Take 1 tablet (250 mg total) by mouth  daily.     losartan 100 MG tablet  Commonly known as:  COZAAR  TAKE 1 TABLET EVERY DAY     metFORMIN 500 MG tablet  Commonly known as:  GLUCOPHAGE  Take 2 tablets (1,000 mg total) by mouth 2 (two) times daily with a meal.     metoprolol tartrate 25 MG tablet  Commonly known as:  LOPRESSOR  Take 0.5 tablets (12.5 mg total) by mouth 2 (two) times daily.     nystatin 100000 UNIT/ML suspension  Commonly known as:  MYCOSTATIN  Take 5 mLs (500,000 Units total) by mouth 4 (four) times daily.     ONE TOUCH LANCETS Misc  Use as directed to check blood sugar twice daily.  Diagnosis code 250.00     sildenafil 100 MG tablet  Commonly known as:  VIAGRA  Take 0.5-1 tablets (50-100 mg total) by mouth daily as needed for erectile dysfunction.         Discharge Instructions    Diet - low sodium heart healthy    Complete by:  As directed      Diet Carb Modified    Complete by:  As directed      Increase activity slowly    Complete by:  As directed           Follow-up Information    Follow up with Sherren Mocha, MD.   Specialty:  Cardiology   Why:  Event monitor and follow-up visit, the office will call.   Contact information:   [address] [address] [phone]       BRING ALL MEDICATIONS WITH YOU TO FOLLOW UP APPOINTMENTS  Time spent with patient to include physician time: 42 min Signed: Rosaria Ferries, PA-C 02/02/2015, 6:44 AM Co-Sign MD

## 2015-01-31 NOTE — ED Notes (Signed)
Pt ambulated in the hallway with no issue. Steady gait, HR 75-82, SpO2 97-100%.

## 2015-01-31 NOTE — Progress Notes (Addendum)
Patient Name: Keith Herrera Date of Encounter: 01/31/2015  Principal Problem:   Near syncope Active Problems:   SVT (supraventricular tachycardia)   Diabetes mellitus type 2, noninsulin dependent   Thrush, oral   Primary Cardiologist: Dr Burt Knack  Patient Profile: 69 male w/ hx CAD and palpitations, admitted 03/14 w/ presyncope, SVT seen, BB started.  SUBJECTIVE: Palpitations w/ SVT, not with presyncope  OBJECTIVE Filed Vitals:   01/31/15 0815 01/31/15 0830 01/31/15 0845 01/31/15 1140  BP: 145/71 146/63 145/91 145/70  Pulse: 57 55 60 55  Temp:      TempSrc:      Resp: 16 18 21 13   Height:      Weight:      SpO2: 96% 96% 97% 100%    Intake/Output Summary (Last 24 hours) at 01/31/15 1258 Last data filed at 01/31/15 0950  Gross per 24 hour  Intake      3 ml  Output    500 ml  Net   -497 ml   Filed Weights   01/30/15 1712  Weight: 165 lb (74.844 kg)    PHYSICAL EXAM General: Well developed, well nourished, male in no acute distress. Head: Normocephalic, atraumatic.  Neck: Supple without bruits, JVD not elevated. Lungs:  Resp regular and unlabored, CTA. Heart: RRR, S1, S2, no S3, S4, or murmur; no rub. Abdomen: Soft, non-tender, non-distended, BS + x 4.  Extremities: No clubbing, cyanosis, no edema.  Neuro: Alert and oriented X 3. Moves all extremities spontaneously. Psych: Normal affect.  LABS: CBC:  Recent Labs  01/30/15 1730  WBC 8.4  HGB 14.8  HCT 42.7  MCV 87.0  PLT 676*   Basic Metabolic Panel:  Recent Labs  01/30/15 1730  NA 139  K 4.3  CL 105  CO2 23  GLUCOSE 250*  BUN 22  CREATININE 0.98  CALCIUM 8.8    Recent Labs  01/30/15 1736  TROPIPOC 0.00   TELE:  SR, sinus brady to high 40s  Radiology/Studies: Dg Chest Port 1 View 01/30/2015   CLINICAL DATA:  Initial evaluation for syncope, nausea, intermittent supraventricular tachycardia  EXAM: PORTABLE CHEST - 1 VIEW  COMPARISON:  12/22/2014  FINDINGS: The heart size and  vascular pattern are normal. Aortic arch calcification stable. Mild hypoventilatory change at the lung bases. Old right clavicular fracture.  IMPRESSION: No active disease.   Electronically Signed   By: Skipper Cliche M.D.   On: 01/30/2015 17:30   Medication Sig  amLODipine (NORVASC) 5 MG tablet TAKE 1 TABLET (5 MG TOTAL) BY MOUTH DAILY.  aspirin 81 MG tablet Take 81 mg by mouth every evening.   atorvastatin (LIPITOR) 10 MG tablet TAKE 1 TABLET EVERY DAY  HYDROcodone-homatropine (HYCODAN) 5-1.5 MG/5ML syrup Take 5 mLs by mouth every 6 (six) hours as needed for cough.  lansoprazole (PREVACID) 30 MG capsule Take 1 capsule (30 mg total) by mouth daily at 12 noon.  losartan (COZAAR) 100 MG tablet TAKE 1 TABLET EVERY DAY  metFORMIN (GLUCOPHAGE) 500 MG tablet Take 2 tablets (1,000 mg total) by mouth 2 (two) times daily with a meal.  sildenafil (VIAGRA) 100 MG tablet Take 0.5-1 tablets (50-100 mg total) by mouth daily as needed for erectile dysfunction.  levofloxacin (LEVAQUIN) 250 MG tablet Take 1 tablet (250 mg total) by mouth daily.  ONE TOUCH LANCETS MISC Use as directed to check blood sugar twice daily.  Diagnosis code 250.00   Current Medications:  . amLODipine  5 mg Oral Daily  .  aspirin EC  81 mg Oral QPM  . atorvastatin  10 mg Oral q1800  . losartan  100 mg Oral Daily  . metFORMIN  1,000 mg Oral BID WC  . metoprolol tartrate  12.5 mg Oral BID  . pantoprazole  40 mg Oral Daily  . sodium chloride  3 mL Intravenous Q12H   . sodium chloride      ASSESSMENT AND PLAN: Principal Problem:   Near syncope - not clear if from tachy or brady arrhythmia - MD advise on event monitor - advised him not to drive  Active Problems:   SVT (supraventricular tachycardia) - seen on ECG - BB started this am, HR was low before then - MD advise on continuing BB - ambulate and see how tolerated    Diabetes mellitus type 2, noninsulin dependent - continue home rx    Thrush, oral - need Nystatin  mouthwash, swish & swallow - f/u with primary MD   Plan - possible d/c today, ambulate  Signed, Rosaria Ferries , PA-C 12:58 PM 01/31/2015

## 2015-02-01 NOTE — Telephone Encounter (Signed)
zpack is an antibiotic which is not normally a refillable medication  Per office policy, Please consider Office visit if pt is ill with an infection he feels may require treatment

## 2015-02-02 ENCOUNTER — Ambulatory Visit (INDEPENDENT_AMBULATORY_CARE_PROVIDER_SITE_OTHER): Payer: Commercial Managed Care - HMO | Admitting: Internal Medicine

## 2015-02-02 ENCOUNTER — Encounter: Payer: Self-pay | Admitting: *Deleted

## 2015-02-02 ENCOUNTER — Encounter (INDEPENDENT_AMBULATORY_CARE_PROVIDER_SITE_OTHER): Payer: Commercial Managed Care - HMO

## 2015-02-02 ENCOUNTER — Ambulatory Visit: Payer: Medicare HMO | Admitting: Cardiovascular Disease

## 2015-02-02 ENCOUNTER — Encounter: Payer: Self-pay | Admitting: Internal Medicine

## 2015-02-02 VITALS — BP 124/74 | HR 57 | Temp 97.9°F | Resp 18 | Ht 71.0 in | Wt 164.1 lb

## 2015-02-02 DIAGNOSIS — J309 Allergic rhinitis, unspecified: Secondary | ICD-10-CM | POA: Diagnosis not present

## 2015-02-02 DIAGNOSIS — I10 Essential (primary) hypertension: Secondary | ICD-10-CM | POA: Diagnosis not present

## 2015-02-02 DIAGNOSIS — R55 Syncope and collapse: Secondary | ICD-10-CM

## 2015-02-02 DIAGNOSIS — R42 Dizziness and giddiness: Secondary | ICD-10-CM | POA: Diagnosis not present

## 2015-02-02 MED ORDER — LEVOCETIRIZINE DIHYDROCHLORIDE 5 MG PO TABS: 5.0000 mg | ORAL_TABLET | Freq: Every evening | ORAL | Status: DC

## 2015-02-02 MED ORDER — MECLIZINE HCL 12.5 MG PO TABS: 12.5000 mg | ORAL_TABLET | Freq: Three times a day (TID) | ORAL | Status: DC | PRN

## 2015-02-02 MED ORDER — PREDNISONE 10 MG PO TABS: ORAL_TABLET | ORAL | Status: DC

## 2015-02-02 MED ORDER — METHYLPREDNISOLONE ACETATE 80 MG/ML IJ SUSP
80.0000 mg | Freq: Once | INTRAMUSCULAR | Status: AC
  Administered 2015-02-02: 80 mg via INTRAMUSCULAR

## 2015-02-02 MED ORDER — DOXYCYCLINE HYCLATE 100 MG PO TABS: 100.0000 mg | ORAL_TABLET | Freq: Two times a day (BID) | ORAL | Status: DC

## 2015-02-02 NOTE — Assessment & Plan Note (Signed)
stable overall by history and exam, recent data reviewed with pt, and pt to continue medical treatment as before,  to f/u any worsening symptoms or concerns BP Readings from Last 3 Encounters:  02/02/15 124/74  01/31/15 156/71  12/22/14 120/72

## 2015-02-02 NOTE — Progress Notes (Signed)
Patient ID: Keith Herrera, male   DOB: Apr 30, 1932, 81 y.o.   MRN: [id number] [employer] verite 30 day cardiac event monitor applied to patient.

## 2015-02-02 NOTE — Progress Notes (Signed)
Subjective:    Patient ID: Keith Herrera, male    DOB: [birth year], 81 y.o.   MRN: [id number]  HPI  Here to f/u - Does have 1-2 wks ongoing nasal allergy symptoms with clearish congestion, itch and sneezing, without fever, pain, ST, cough, swelling or wheezing.Has been assoc with positional vertigo last 2 days as well.  Pt denies new neurological symptoms such as new headache, or facial or extremity weakness or numbness   Pt denies polydipsia, polyuria, Did have recent syncope but none post d/c, now with monitor in place.   Past Medical History  Diagnosis Date  . GERD (gastroesophageal reflux disease)   . Hiatal hernia   . Hemorrhoids   . Adenomatous polyp of colon 05/2004  . Diverticulosis     colon  . Hypertension   . Hyperlipidemia   . Diabetes type 2, controlled   . Anxiety   . CAD (coronary artery disease)     s/p Cypher DES to LAD and pD1 2010;  LHC was done 6/12: EF 65%, circumflex patent, mid RCA 80-90% (small and nondominant), LAD and diagonal stents patent, LAD at the origin of the first diagonal 30%, ostial D2 90%, mid 80-90% (small vessel).  Continued medical therapy was recommended.   . Right inguinal hernia     s/p repair in 8/12  . Esophageal stricture   . Nephrolithiasis   . ED (erectile dysfunction)   . Allergic rhinitis   . IBS (irritable bowel syndrome)   . BPH (benign prostatic hyperplasia)   . Carotid stenosis     dopplers 02/2012: 3-33% RICA; 54-56% LICA   Past Surgical History  Procedure Laterality Date  . Coronary stent placement  10/2008    x 2  . Hernia repair      RIH with ultrapro patch    reports that he has quit smoking. He does not have any smokeless tobacco history on file. He reports that he does not drink alcohol or use illicit drugs. family history includes Cancer (age of onset: 58) in his mother; Cancer (age of onset: 40) in his sister. Allergies  Allergen Reactions  . Crestor [Rosuvastatin Calcium] Rash    All over body  . Lovastatin  Itching and Rash   Current Outpatient Prescriptions on File Prior to Visit  Medication Sig Dispense Refill  . amLODipine (NORVASC) 5 MG tablet TAKE 1 TABLET (5 MG TOTAL) BY MOUTH DAILY. 90 tablet 3  . aspirin 81 MG tablet Take 81 mg by mouth every evening.     Marland Kitchen atorvastatin (LIPITOR) 10 MG tablet TAKE 1 TABLET EVERY DAY 90 tablet 2  . HYDROcodone-homatropine (HYCODAN) 5-1.5 MG/5ML syrup Take 5 mLs by mouth every 6 (six) hours as needed for cough. 180 mL 0  . lansoprazole (PREVACID) 30 MG capsule Take 1 capsule (30 mg total) by mouth daily at 12 noon. 90 capsule 3  . losartan (COZAAR) 100 MG tablet TAKE 1 TABLET EVERY DAY 90 tablet 3  . metFORMIN (GLUCOPHAGE) 500 MG tablet Take 2 tablets (1,000 mg total) by mouth 2 (two) times daily with a meal. 360 tablet 11  . metoprolol tartrate (LOPRESSOR) 25 MG tablet Take 0.5 tablets (12.5 mg total) by mouth 2 (two) times daily. 45 tablet 11  . nystatin (MYCOSTATIN) 100000 UNIT/ML suspension Take 5 mLs (500,000 Units total) by mouth 4 (four) times daily. 120 mL 0  . ONE TOUCH LANCETS MISC Use as directed to check blood sugar twice daily.  Diagnosis code 250.00 200 each  11  . sildenafil (VIAGRA) 100 MG tablet Take 0.5-1 tablets (50-100 mg total) by mouth daily as needed for erectile dysfunction. 5 tablet 11   No current facility-administered medications on file prior to visit.   Review of Systems  Constitutional: Negative for unusual diaphoresis or night sweats HENT: Negative for ringing in ear or discharge Eyes: Negative for double vision or worsening visual disturbance.  Respiratory: Negative for choking and stridor.   Gastrointestinal: Negative for vomiting or other signifcant bowel change Genitourinary: Negative for hematuria or change in urine volume.  Musculoskeletal: Negative for other MSK pain or swelling Skin: Negative for color change and worsening wound.  Neurological: Negative for tremors and numbness other than noted    Psychiatric/Behavioral: Negative for decreased concentration or agitation other than above       Objective:   Physical Exam BP 124/74 mmHg  Pulse 57  Temp(Src) 97.9 F (36.6 C) (Oral)  Resp 18  Ht 5\' 11"  (1.803 m)  Wt 164 lb 1.3 oz (74.426 kg)  BMI 22.89 kg/m2  SpO2 99% VS noted,  Not ill appearing Constitutional: Pt appears in no significant distress HENT: Head: NCAT.  Right Ear: External ear normal.  Left Ear: External ear normal.  Eyes: . Pupils are equal, round, and reactive to light. Conjunctivae and EOM are normal Bilat tm's with mild erythema.  Max sinus areas non tender.  Pharynx with mild erythema, no exudate Neck: Normal range of motion. Neck supple.  Cardiovascular: Normal rate and regular rhythm.   Pulmonary/Chest: Effort normal and breath sounds without rales or wheezing.  Neurological: Pt is alert. Not confused , motor grossly intact Skin: Skin is warm. No rash, no LE edema Psychiatric: Pt behavior is normal. No agitation.      Assessment & Plan:

## 2015-02-02 NOTE — Progress Notes (Signed)
Pre visit review using our clinic review tool, if applicable. No additional management support is needed unless otherwise documented below in the visit note. 

## 2015-02-02 NOTE — Assessment & Plan Note (Signed)
Mild to mod, for depomedrol IM, antihist/nasal steroid,  to f/u any worsening symptoms or concerns

## 2015-02-02 NOTE — Patient Instructions (Addendum)
You had the steroid shot today  Please take all new medication as prescribed - the meclizine for dizziness if needed  Please take all new medication as prescribed - the antibiotic, prednisone for the short term, then the generic Xyzal (antihistamiine) more for the longer term  Please continue all other medications as before, and refills have been done if requested.  Please have the pharmacy call with any other refills you may need.  Please keep your appointments with your specialists as you may have planned

## 2015-02-02 NOTE — Assessment & Plan Note (Signed)
Mild to mod, for meclizine prn, to f/u any worsening symptoms or concerns 

## 2015-02-07 ENCOUNTER — Other Ambulatory Visit: Payer: Self-pay | Admitting: Internal Medicine

## 2015-02-08 ENCOUNTER — Telehealth: Payer: Self-pay | Admitting: Internal Medicine

## 2015-02-08 MED ORDER — HYDROCODONE-HOMATROPINE 5-1.5 MG/5ML PO SYRP: 5.0000 mL | ORAL_SOLUTION | Freq: Four times a day (QID) | ORAL | Status: DC | PRN

## 2015-02-08 NOTE — Telephone Encounter (Signed)
Pt wife called in request refill on this HYDROcodone-homatropine (HYCODAN) 5-1.5 MG/5ML syrup [[id number]]

## 2015-02-08 NOTE — Telephone Encounter (Signed)
Done hardcopy to Cherina  

## 2015-02-09 NOTE — Telephone Encounter (Signed)
Called pt no answer LMOM rx ready for pick-up.../lmb 

## 2015-02-14 ENCOUNTER — Ambulatory Visit (INDEPENDENT_AMBULATORY_CARE_PROVIDER_SITE_OTHER): Payer: Commercial Managed Care - HMO | Admitting: Cardiovascular Disease

## 2015-02-14 ENCOUNTER — Encounter: Payer: Self-pay | Admitting: Cardiovascular Disease

## 2015-02-14 VITALS — BP 140/62 | HR 66 | Ht 71.0 in | Wt 162.8 lb

## 2015-02-14 DIAGNOSIS — I471 Supraventricular tachycardia: Secondary | ICD-10-CM

## 2015-02-14 MED ORDER — DILTIAZEM HCL ER COATED BEADS 180 MG PO CP24: 180.0000 mg | ORAL_CAPSULE | Freq: Every day | ORAL | Status: DC

## 2015-02-14 NOTE — Progress Notes (Signed)
Cardiology Office Note   Date:  02/14/2015   ID:  OJAS COONE, DOB 08-Jun-1932, MRN [id number]  PCP:  Cathlean Cower, MD  Cardiologist:  Sherren Mocha, MD    Chief Complaint  Patient presents with  . Cough    dizzy     History of Present Illness: Keith Herrera is a 81 y.o. male who presents for  Hospital follow-up evaluation.  The patient has coronary artery disease and has undergone stenting of the LAD and diagonal branches in 2010. His coronary artery disease was identified by an abnormal stress Myoview scan. The patient was asymptomatic at the time of his initial presentation. He is also followed for hypertension, hyperlipidemia, and palpitations. His most recent cardiac catheterization was in 2012 and demonstrated patency of his LAD and diagonal stents. He has some small vessel disease involving his diagonal branches and a nondominant RCA.   His last nuclear stress test in August 2015 showed no evidence of ischemia.   the patient was recently hospitalized with heart palpitations. He had lightheadedness and near syncope. Telemetry demonstrated SVT. A beta blocker was started and he was discharged home with an event monitor. He presents today for follow-up evaluation.  The patient complains of dizziness. He is having episodes that occur with turning his head. Other times symptoms are not precipitated by any specific movement or position changes. He has felt a fullness in the right side of the head, ear, and sinuses. He's had some heart palpitations, but no chest pain or pressure. He denies dyspnea or frank syncope. No leg swelling.  Past Medical History  Diagnosis Date  . GERD (gastroesophageal reflux disease)   . Hiatal hernia   . Hemorrhoids   . Adenomatous polyp of colon 05/2004  . Diverticulosis     colon  . Hypertension   . Hyperlipidemia   . Diabetes type 2, controlled   . Anxiety   . CAD (coronary artery disease)     s/p Cypher DES to LAD and pD1 2010;  LHC was done  6/12: EF 65%, circumflex patent, mid RCA 80-90% (small and nondominant), LAD and diagonal stents patent, LAD at the origin of the first diagonal 30%, ostial D2 90%, mid 80-90% (small vessel).  Continued medical therapy was recommended.   . Right inguinal hernia     s/p repair in 8/12  . Esophageal stricture   . Nephrolithiasis   . ED (erectile dysfunction)   . Allergic rhinitis   . IBS (irritable bowel syndrome)   . BPH (benign prostatic hyperplasia)   . Carotid stenosis     dopplers 02/2012: 5-05% RICA; 39-76% LICA    Past Surgical History  Procedure Laterality Date  . Coronary stent placement  10/2008    x 2  . Hernia repair      RIH with ultrapro patch    Current Outpatient Prescriptions  Medication Sig Dispense Refill  . amLODipine (NORVASC) 5 MG tablet TAKE 1 TABLET (5 MG TOTAL) BY MOUTH DAILY. 90 tablet 3  . aspirin 81 MG tablet Take 81 mg by mouth every evening.     Marland Kitchen atorvastatin (LIPITOR) 10 MG tablet Take 10 mg by mouth daily.  2  . doxycycline (VIBRA-TABS) 100 MG tablet Take 1 tablet (100 mg total) by mouth 2 (two) times daily. 20 tablet 0  . HYDROcodone-homatropine (HYCODAN) 5-1.5 MG/5ML syrup Take 5 mLs by mouth every 6 (six) hours as needed for cough. 180 mL 0  . lansoprazole (PREVACID) 30 MG capsule Take  1 capsule (30 mg total) by mouth daily at 12 noon. 90 capsule 3  . levocetirizine (XYZAL) 5 MG tablet Take 1 tablet (5 mg total) by mouth every evening. 30 tablet 5  . losartan (COZAAR) 100 MG tablet TAKE 1 TABLET EVERY DAY 90 tablet 3  . meclizine (ANTIVERT) 12.5 MG tablet Take 1 tablet (12.5 mg total) by mouth 3 (three) times daily as needed for dizziness. 30 tablet 1  . metFORMIN (GLUCOPHAGE) 500 MG tablet Take 2 tablets (1,000 mg total) by mouth 2 (two) times daily with a meal. 360 tablet 11  . metoprolol tartrate (LOPRESSOR) 25 MG tablet Take 0.5 tablets (12.5 mg total) by mouth 2 (two) times daily. 45 tablet 11  . sildenafil (VIAGRA) 100 MG tablet Take 0.5-1  tablets (50-100 mg total) by mouth daily as needed for erectile dysfunction. 5 tablet 11   No current facility-administered medications for this visit.    Allergies:   Crestor and Lovastatin   Social History:  The patient  reports that he has quit smoking. He does not have any smokeless tobacco history on file. He reports that he does not drink alcohol or use illicit drugs.   Family History:  The patient's family history includes Cancer (age of onset: 27) in his mother; Cancer (age of onset: 51) in his sister.    ROS:  Please see the history of present illness.  Otherwise, review of systems is positive for  Cough, dizziness, easy bruising.  All other systems are reviewed and negative.    PHYSICAL EXAM: VS:  BP 140/62 mmHg  Pulse 66  Ht 5\' 11"  (1.803 m)  Wt 162 lb 12.8 oz (73.846 kg)  BMI 22.72 kg/m2 , BMI Body mass index is 22.72 kg/(m^2). GEN: Well nourished, well developed, in no acute distress HEENT: normal Neck: no JVD, no masses. No carotid bruits Cardiac: RRR without murmur or gallop                Respiratory:  clear to auscultation bilaterally, normal work of breathing GI: soft, nontender, nondistended, + BS MS: no deformity or atrophy Ext: no pretibial edema, pedal pulses 2+= bilaterally Skin: warm and dry, no rash Neuro:  Strength and sensation are intact Psych: euthymic mood, full affect  EKG:  EKG is ordered today. The ekg ordered today shows NSR, within normal limits  Recent Labs: 12/13/2014: ALT 23; TSH 1.71 01/30/2015: BUN 22; Creatinine 0.98; Hemoglobin 14.8; Platelets 145*; Potassium 4.3; Sodium 139   Lipid Panel     Component Value Date/Time   CHOL 149 12/13/2014 1344   TRIG 205.0* 12/13/2014 1344   TRIG 128 11/19/2006 1032   HDL 32.80* 12/13/2014 1344   CHOLHDL 5 12/13/2014 1344   CHOLHDL 4.4 CALC 11/19/2006 1032   VLDL 41.0* 12/13/2014 1344   LDLCALC 79 12/21/2012 0859   LDLDIRECT 102.0 12/13/2014 1344      Wt Readings from Last 3 Encounters:    02/14/15 162 lb 12.8 oz (73.846 kg)  02/02/15 164 lb 1.3 oz (74.426 kg)  01/30/15 165 lb (74.844 kg)     Cardiac Studies Reviewed: Stress Myoview scan 07/11/2014: Overall Impression: Low risk stress nuclear study Normal myocardial perfusion with no evidence of ischemia. EKG showed baseline ST abnormality with 71mm of horizontal to upsloping ST segment depression during exercise. Fair exercise tolerance..  LV Ejection Fraction: 71%. LV Wall Motion: NL LV Function; NL Wall Motion   event monitor demonstrates episodes of paroxysmal SVT with heart rate approximately 190 bpm.  ASSESSMENT AND PLAN: 1.  PSVT: I have reviewed tele strips from his event monitor and he appears to be having frequent paroxysms of SVT. I'm not sure they are responsible for his dizziness because he seems to have some sinus/ENT symptoms as well. Will change from low-dose lopressor to cardizem. Refer to EP.  2. CAD, native vessel: stable without angina.  3. HTN: controlled. Stop norvasc as we are starting cardizem. Follow bp.    Current medicines are reviewed with the patient today.  The patient does not have concerns regarding medicines.  The following changes have been made:  See above  Labs/ tests ordered today include:  No orders of the defined types were placed in this encounter.    Disposition:   FU EP referral  Signed, Sherren Mocha, MD  02/14/2015 3:34 PM    [facility] [address], [place], [place]  [address] Phone: [phone]; Fax: [phone]

## 2015-02-14 NOTE — Patient Instructions (Signed)
Your physician has recommended you make the following change in your medication:   1. STOP metoprolol 2. STOP amlodipine 3. START cardizem CD (diltizem)  180 mg by mouth once daily  You have been referred to Dr. Lovena Le for evaluation of SVT.  Supraventricular Tachycardia Supraventricular tachycardia (SVT) is an abnormal heart rhythm (arrhythmia) that causes the heart to beat very fast (tachycardia). This kind of fast heartbeat originates in the upper chambers of the heart (atria). SVT can cause the heart to beat greater than 100 beats per minute. SVT can have a rapid burst of heartbeats. This can start and stop suddenly without warning and is called nonsustained. SVT can also be sustained, in which the heart beats at a continuous fast rate.  CAUSES  There can be different causes of SVT. Some of these include:  Heart valve problems such as mitral valve prolapse.  An enlarged heart (hypertrophic cardiomyopathy).  Congenital heart problems.  Heart inflammation (pericarditis).  Hyperthyroidism.  Low potassium or magnesium levels.  Caffeine.  Drug use such as cocaine, methamphetamines, or stimulants.  Some over-the-counter medicines such as:  Decongestants.  Diet medicines.  Herbal medicines. SYMPTOMS  Symptoms of SVT can vary. Symptoms depend on whether the SVT is sustained or nonsustained. You may experience:  No symptoms (asymptomatic).  An awareness of your heart beating rapidly (palpitations).  Shortness of breath.  Chest pain or pressure. If your blood pressure drops because of the SVT, you may experience:  Fainting or near fainting.  Weakness.  Dizziness. DIAGNOSIS  Different tests can be performed to diagnose SVT, such as:  An electrocardiogram (EKG). This is a painless test that records the electrical activity of your heart.  Holter monitor. This is a 24 hour recording of your heart rhythm. You will be given a diary. Write down all symptoms that you have  and what you were doing at the time you experienced symptoms.  Arrhythmia monitor. This is a small device that your wear for several weeks. It records the heart rhythm when you have symptoms.  Echocardiogram. This is an imaging test to help detect abnormal heart structure such as congenital abnormalities, heart valve problems, or heart enlargement.  Stress test. This test can help determine if the SVT is related to exercise.  Electrophysiology study (EPS). This is a procedure that evaluates your heart's electrical system and can help your caregiver find the cause of your SVT. TREATMENT  Treatment of SVT depends on the symptoms, how often it recurs, and whether there are any underlying heart problems.   If symptoms are rare and no other cardiac disease is present, no treatment may be needed.  Blood work may be done to check potassium, magnesium, and thyroid hormone levels to see if they are abnormal. If these levels are abnormal, treatment to correct the problems will occur. Medicines Your caregiver may use oral medicines to treat SVT. These medicines are given for long-term control of SVT. Medicines may be used alone or in combination with other treatments. These medicines work to slow nerve impulses in the heart muscle. These medicines can also be used to treat high blood pressure. Some of these medicines may include:  Calcium channel blockers.  Beta blockers.  Digoxin. Nonsurgical procedures Nonsurgical techniques may be used if oral medicines do not work. Some examples include:  Cardioversion. This technique uses either drugs or an electrical shock to restore a normal heart rhythm.  Cardioversion drugs may be given through an intravenous (IV) line to help "reset" the heart  rhythm.  In electrical cardioversion, the caregiver shocks your heart to stop its beat for a split second. This helps to reset the heart to a normal rhythm.  Ablation. This procedure is done under mild sedation.  High frequency radio wave energy is used to destroy the area of heart tissue responsible for the SVT. HOME CARE INSTRUCTIONS   Do not smoke.  Only take medicines prescribed by your caregiver. Check with your caregiver before using over-the-counter medicines.  Check with your caregiver about how much alcohol and caffeine (coffee, tea, colas, or chocolate) you may have.  It is very important to keep all follow-up referrals and appointments in order to properly manage this problem. SEEK IMMEDIATE MEDICAL CARE IF:  You have dizziness.  You faint or nearly faint.  You have shortness of breath.  You have chest pain or pressure.  You have sudden nausea or vomiting.  You have profuse sweating.  You are concerned about how long your symptoms last.  You are concerned about the frequency of your SVT episodes. If you have the above symptoms, call your local emergency services (911 in [place] immediately. Do not drive yourself to the hospital. MAKE SURE YOU:   Understand these instructions.  Will watch your condition.  Will get help right away if you are not doing well or get worse. Document Released: 11/04/2005 Document Revised: 01/27/2012 Document Reviewed: 02/16/2009 [facility] Patient Information 2015 [place], [place]. This information is not intended to replace advice given to you by your health care provider. Make sure you discuss any questions you have with your health care provider.

## 2015-02-17 ENCOUNTER — Other Ambulatory Visit: Payer: Self-pay

## 2015-02-17 ENCOUNTER — Telehealth: Payer: Self-pay | Admitting: Internal Medicine

## 2015-02-17 MED ORDER — GLUCOSE BLOOD VI STRP: ORAL_STRIP | Status: DC

## 2015-02-17 NOTE — Telephone Encounter (Signed)
Pt called in requesting refill on test strips    [facility] in [facility]

## 2015-02-17 NOTE — Telephone Encounter (Signed)
Sent to pharmacy 

## 2015-02-28 ENCOUNTER — Telehealth: Payer: Self-pay | Admitting: Cardiovascular Disease

## 2015-02-28 NOTE — Telephone Encounter (Signed)
Patient reporting concern that he is still having frequent episodes daily of dizziness and chest discomfort. He is currently finishing out the last few days of a 30 Day Event Monitor. He states he rarely pushes the button when he experiences symptoms. Patient states he has an upcoming appointment with Dr. Lovena Le on 4/19, but he feels like he needs to be seen sooner. Patient denies SOB, active CP, HA or other acute symptoms at this time. Dr. Marlou Porch reviewed patient's concern and advised that 4/19 is the first available appointment with an EP MD for this referral from Dr. Burt Knack, in addition, patient needs to finish up the cardiac event monitor for an accurate reading. Patient advised that should he have an acute/active episode to please call office or he can call 911 to be seen emergently. Patient states he prefers not to go to ED "just to sit and wait to be discharged". Patient verbalized that he is okay with waiting until next week (4/19) to see Dr. Lovena Le and he will call us back should his symptoms worsen. Also patient encouraged to please push event monitor button when he is experiencing episodes of either discomfort, dizziness or any type of cardiac symptom. Patient stated he would try to remember to do that next time.

## 2015-02-28 NOTE — Telephone Encounter (Signed)
New message    Patient calling     Pt c/o of Chest Pain: STAT if CP now or developed within 24 hours  1. Are you having CP right now? No  . Wearing heart monitor for 3 weeks.    2. Are you experiencing any other symptoms (ex. SOB, nausea, vomiting, sweating)?  Dizziness.   3. How long have you been experiencing CP? About a week   4. Is your CP continuous or coming and going? Off / on   5. Have you taken Nitroglycerin? No .    Pt has appt with Dr. Lovena Le on 4.19.2016 pt feel like this is took long to wait.   ?

## 2015-03-05 ENCOUNTER — Emergency Department (HOSPITAL_COMMUNITY)
Admission: EM | Admit: 2015-03-05 | Discharge: 2015-03-06 | Disposition: A | Discharge status: Home / Self Care | Payer: Commercial Managed Care - HMO | Attending: Emergency Medicine | Admitting: Emergency Medicine

## 2015-03-05 ENCOUNTER — Encounter (HOSPITAL_COMMUNITY): Payer: Self-pay | Admitting: *Deleted

## 2015-03-05 ENCOUNTER — Emergency Department (HOSPITAL_COMMUNITY): Payer: Commercial Managed Care - HMO

## 2015-03-05 DIAGNOSIS — Z79899 Other long term (current) drug therapy: Secondary | ICD-10-CM | POA: Insufficient documentation

## 2015-03-05 DIAGNOSIS — I10 Essential (primary) hypertension: Secondary | ICD-10-CM | POA: Insufficient documentation

## 2015-03-05 DIAGNOSIS — E119 Type 2 diabetes mellitus without complications: Secondary | ICD-10-CM | POA: Insufficient documentation

## 2015-03-05 DIAGNOSIS — Z87891 Personal history of nicotine dependence: Secondary | ICD-10-CM | POA: Diagnosis not present

## 2015-03-05 DIAGNOSIS — R002 Palpitations: Secondary | ICD-10-CM | POA: Insufficient documentation

## 2015-03-05 DIAGNOSIS — Z87442 Personal history of urinary calculi: Secondary | ICD-10-CM | POA: Diagnosis not present

## 2015-03-05 DIAGNOSIS — Z8659 Personal history of other mental and behavioral disorders: Secondary | ICD-10-CM | POA: Insufficient documentation

## 2015-03-05 DIAGNOSIS — E785 Hyperlipidemia, unspecified: Secondary | ICD-10-CM | POA: Insufficient documentation

## 2015-03-05 DIAGNOSIS — Z7982 Long term (current) use of aspirin: Secondary | ICD-10-CM | POA: Diagnosis not present

## 2015-03-05 DIAGNOSIS — I2510 Atherosclerotic heart disease of native coronary artery without angina pectoris: Secondary | ICD-10-CM | POA: Diagnosis not present

## 2015-03-05 DIAGNOSIS — R0602 Shortness of breath: Secondary | ICD-10-CM | POA: Diagnosis not present

## 2015-03-05 DIAGNOSIS — K219 Gastro-esophageal reflux disease without esophagitis: Secondary | ICD-10-CM | POA: Insufficient documentation

## 2015-03-05 DIAGNOSIS — R51 Headache: Secondary | ICD-10-CM | POA: Diagnosis not present

## 2015-03-05 DIAGNOSIS — R42 Dizziness and giddiness: Secondary | ICD-10-CM | POA: Insufficient documentation

## 2015-03-05 DIAGNOSIS — R55 Syncope and collapse: Secondary | ICD-10-CM | POA: Insufficient documentation

## 2015-03-05 DIAGNOSIS — Z87438 Personal history of other diseases of male genital organs: Secondary | ICD-10-CM | POA: Diagnosis not present

## 2015-03-05 DIAGNOSIS — R079 Chest pain, unspecified: Secondary | ICD-10-CM | POA: Diagnosis not present

## 2015-03-05 LAB — CBC WITH DIFFERENTIAL/PLATELET
Basophils Absolute: 0 10*3/uL (ref 0.0–0.1)
Basophils Relative: 0 % (ref 0–1)
EOS PCT: 1 % (ref 0–5)
Eosinophils Absolute: 0.1 10*3/uL (ref 0.0–0.7)
HCT: 44.1 % (ref 39.0–52.0)
HEMOGLOBIN: 15.3 g/dL (ref 13.0–17.0)
Lymphocytes Relative: 24 % (ref 12–46)
Lymphs Abs: 1.6 10*3/uL (ref 0.7–4.0)
MCH: 30.3 pg (ref 26.0–34.0)
MCHC: 34.7 g/dL (ref 30.0–36.0)
MCV: 87.3 fL (ref 78.0–100.0)
Monocytes Absolute: 0.5 10*3/uL (ref 0.1–1.0)
Monocytes Relative: 8 % (ref 3–12)
NEUTROS PCT: 67 % (ref 43–77)
Neutro Abs: 4.4 10*3/uL (ref 1.7–7.7)
PLATELETS: 169 10*3/uL (ref 150–400)
RBC: 5.05 MIL/uL (ref 4.22–5.81)
RDW: 13.6 % (ref 11.5–15.5)
WBC: 6.6 10*3/uL (ref 4.0–10.5)

## 2015-03-05 NOTE — ED Notes (Signed)
The pt is c/o a rapid heart rate  With dizziness and some sob for 3-4 days.   No chest pain ar present

## 2015-03-05 NOTE — ED Provider Notes (Signed)
CSN: [id number]     Arrival date & time 03/05/15  2258 History   First MD Initiated Contact with Patient 03/05/15 2334     This chart was scribed for No att. providers found by Forrestine Him, ED Scribe. This patient was seen in room D33C/D33C and the patient's care was started 11:35 PM.   Chief Complaint  Patient presents with  . Chest Pain   The history is provided by the patient. No language interpreter was used.    HPI Comments: GRADYN SHEIN is a 81 y.o. male with a PMHx of GERD, HTN, hyperlipidemia, DM, CAD, anxiety,  and carotid stenosis who presents to the Emergency Department complaining of intermittent, ongoing, worsening dizziness x 3-4 days. Dizziness described as "spinning" and lightheadedness. Episodes typically come on once an hour lasting a few seconds to several minutes. Pt also reports rapid heart rate, HA, tingling in hands bilaterally, and SOB. Mr. Shorey was admits to hospital 01/2014 for irregular heart rate. At time of discharge, pt was referred to [facility] to have an event monitor placed.   Pt was recently switched to Diltiazem daily. Pt with known allergies to Crestor and Lovastatin.  He is followed by Dr. Sherren Mocha of [facility]  Past Medical History  Diagnosis Date  . GERD (gastroesophageal reflux disease)   . Hiatal hernia   . Hemorrhoids   . Adenomatous polyp of colon 05/2004  . Diverticulosis     colon  . Hypertension   . Hyperlipidemia   . Diabetes type 2, controlled   . Anxiety   . CAD (coronary artery disease)     s/p Cypher DES to LAD and pD1 2010;  LHC was done 6/12: EF 65%, circumflex patent, mid RCA 80-90% (small and nondominant), LAD and diagonal stents patent, LAD at the origin of the first diagonal 30%, ostial D2 90%, mid 80-90% (small vessel).  Continued medical therapy was recommended.   . Right inguinal hernia     s/p repair in 8/12  . Esophageal stricture   . Nephrolithiasis   . ED (erectile dysfunction)   . Allergic  rhinitis   . IBS (irritable bowel syndrome)   . BPH (benign prostatic hyperplasia)   . Carotid stenosis     dopplers 02/2012: 0-86% RICA; 76-19% LICA   Past Surgical History  Procedure Laterality Date  . Coronary stent placement  10/2008    x 2  . Hernia repair      RIH with ultrapro patch   Family History  Problem Relation Age of Onset  . Cancer Mother 20    unknown cancer  . Cancer Sister 30    unknown cancer   History  Substance Use Topics  . Smoking status: Former Research scientist (life sciences)  . Smokeless tobacco: Not on file  . Alcohol Use: No    Review of Systems  Constitutional: Negative for fever and chills.  Respiratory: Positive for shortness of breath.   Cardiovascular: Positive for chest pain and palpitations.  Skin: Negative for rash.  Neurological: Positive for dizziness, light-headedness and headaches.  Psychiatric/Behavioral: Negative for confusion.      Allergies  Crestor and Lovastatin  Home Medications   Prior to Admission medications   Medication Sig Start Date End Date Taking? Authorizing Provider  aspirin 81 MG tablet Take 81 mg by mouth every evening.    Yes Historical Provider, MD  atorvastatin (LIPITOR) 10 MG tablet Take 10 mg by mouth daily. 12/13/14  Yes Historical Provider, MD  glucose blood test  strip Use as instructed 02/17/15  Yes [name], MD  lansoprazole (PREVACID) 30 MG capsule Take 1 capsule (30 mg total) by mouth daily at 12 noon. 08/16/14  Yes [name], MD  metFORMIN (GLUCOPHAGE) 500 MG tablet Take 2 tablets (1,000 mg total) by mouth 2 (two) times daily with a meal. 12/14/14  Yes [name], MD  losartan (COZAAR) 100 MG tablet Take 100 mg by mouth daily.    Historical Provider, MD  meclizine (ANTIVERT) 12.5 MG tablet Take 12.5 mg by mouth 3 (three) times daily as needed for dizziness.    Historical Provider, MD  metoprolol tartrate (LOPRESSOR) 25 MG tablet Take 0.5 tablets (12.5 mg total) by mouth 2 (two) times daily. 03/07/15   Evans Lance,  MD   Triage Vitals: BP 148/73 mmHg  Pulse 72  Temp(Src) 97.7 F (36.5 C) (Oral)  Resp 18  Ht 5\' 11"  (1.803 m)  Wt 165 lb (74.844 kg)  BMI 23.02 kg/m2  SpO2 97%   Physical Exam  Constitutional: He is oriented to person, place, and time. He appears well-developed and well-nourished.  HENT:  Head: Normocephalic and atraumatic.  Eyes: EOM are normal.  Neck: Normal range of motion.  Cardiovascular: Normal rate, regular rhythm, normal heart sounds and intact distal pulses.   2 plus radial pulse bilaterally  Pulmonary/Chest: Effort normal and breath sounds normal. No respiratory distress. He has no wheezes.  Abdominal: Soft. He exhibits no distension and no mass. There is no tenderness.  No palpable masses  Musculoskeletal: Normal range of motion.  Neurological: He is alert and oriented to person, place, and time.  Finger to nose exam is normal Cranial nerves 2-12 intact cerebellar testing is normal Negative hints exam  Skin: Skin is warm and dry.  Psychiatric: He has a normal mood and affect. Judgment normal.  Nursing note and vitals reviewed.   ED Course  Procedures (including critical care time)  DIAGNOSTIC STUDIES: Oxygen Saturation is 98% on RA, Normal by my interpretation.    COORDINATION OF CARE: 1:59 AM-Discussed treatment plan with pt at bedside and pt agreed to plan.     Labs Review Labs Reviewed  BASIC METABOLIC PANEL - Abnormal; Notable for the following:    Glucose, Bld 194 (*)    GFR calc non Af Amer 66 (*)    GFR calc Af Amer 76 (*)    All other components within normal limits  BRAIN NATRIURETIC PEPTIDE  CBC WITH DIFFERENTIAL/PLATELET  TROPONIN I    Imaging Review Dg Chest 2 View  03/06/2015   CLINICAL DATA:  Midsternal chest pain with left arm radiation.  EXAM: CHEST  2 VIEW  COMPARISON:  01/30/2015  FINDINGS: Patient is mildly rotated to the left. Thoracic aortic calcification is noted. Cardiac silhouette is within normal limits for size. The patient  has taken a greater inspiration than on the prior study. No airspace consolidation, edema, pleural effusion, or pneumothorax is seen. Old right clavicle fracture is again noted. There is moderate thoracolumbar dextroscoliosis.  IMPRESSION: No active cardiopulmonary disease.   Electronically Signed   By: Logan Bores   On: 03/06/2015 00:06     EKG Interpretation   Date/Time:  Sunday March 05 2015 23:02:47 EDT Ventricular Rate:  79 PR Interval:  130 QRS Duration: 80 QT Interval:  372 QTC Calculation: 426 R Axis:   62 Text Interpretation:  Normal sinus rhythm Normal ECG ed] Confirmed by  TEST, Record ([id number]) on 03/07/2015 7:21:04 AM       @  2: Cards team noticed that sx reproduced with laying flat and pt is orthostatics. Will give him ivf. They recommend discharge. MDM   Final diagnoses:  Palpitations  Near syncope    I personally performed the services described in this documentation, which was scribed in my presence. The recorded information has been reviewed and is accurate.  Pt comes in with cc of palpitations, and related symptoms. Known to have PSVT. Recent admission for near syncope and palpitations, w/ neg workup, pt was discharged with an event monitor. Symptoms getting worse. Neuro exam is neg, HINTs exam is neg, pt has no true vertgo and antivert has not been given. Pt specifically informs, that when the episodes occur, he is having palpitations.  i dont see a reason to admit currently, as he had neg workup recently. Cards consulted, as they have been managing his tachyarrhythmia.   Varney Biles, MD 03/08/15 [id number]

## 2015-03-06 DIAGNOSIS — K219 Gastro-esophageal reflux disease without esophagitis: Secondary | ICD-10-CM | POA: Diagnosis not present

## 2015-03-06 DIAGNOSIS — R42 Dizziness and giddiness: Secondary | ICD-10-CM | POA: Diagnosis not present

## 2015-03-06 DIAGNOSIS — E119 Type 2 diabetes mellitus without complications: Secondary | ICD-10-CM | POA: Diagnosis not present

## 2015-03-06 DIAGNOSIS — I2510 Atherosclerotic heart disease of native coronary artery without angina pectoris: Secondary | ICD-10-CM | POA: Diagnosis not present

## 2015-03-06 DIAGNOSIS — R002 Palpitations: Secondary | ICD-10-CM | POA: Diagnosis not present

## 2015-03-06 DIAGNOSIS — E785 Hyperlipidemia, unspecified: Secondary | ICD-10-CM | POA: Diagnosis not present

## 2015-03-06 DIAGNOSIS — R0602 Shortness of breath: Secondary | ICD-10-CM | POA: Diagnosis not present

## 2015-03-06 DIAGNOSIS — I10 Essential (primary) hypertension: Secondary | ICD-10-CM | POA: Diagnosis not present

## 2015-03-06 DIAGNOSIS — R55 Syncope and collapse: Secondary | ICD-10-CM | POA: Diagnosis not present

## 2015-03-06 LAB — BASIC METABOLIC PANEL
Anion gap: 12 (ref 5–15)
BUN: 18 mg/dL (ref 6–23)
CALCIUM: 8.9 mg/dL (ref 8.4–10.5)
CHLORIDE: 102 mmol/L (ref 96–112)
CO2: 25 mmol/L (ref 19–32)
CREATININE: 1.04 mg/dL (ref 0.50–1.35)
GFR calc Af Amer: 76 mL/min — ABNORMAL LOW (ref >90)
GFR calc non Af Amer: 66 mL/min — ABNORMAL LOW (ref >90)
GLUCOSE: 194 mg/dL — AB (ref 70–99)
Potassium: 3.7 mmol/L (ref 3.5–5.1)
Sodium: 139 mmol/L (ref 135–145)

## 2015-03-06 LAB — BRAIN NATRIURETIC PEPTIDE: B Natriuretic Peptide: 48.9 pg/mL (ref 0.0–100.0)

## 2015-03-06 LAB — TROPONIN I: Troponin I: <0.03 ng/mL (ref <0.031)

## 2015-03-06 MED ORDER — SODIUM CHLORIDE 0.9 % IV BOLUS (SEPSIS)
1000.0000 mL | Freq: Once | INTRAVENOUS | Status: AC
  Administered 2015-03-06: 1000 mL via INTRAVENOUS

## 2015-03-06 NOTE — ED Notes (Signed)
Cardiology at bedside.

## 2015-03-06 NOTE — ED Notes (Addendum)
Patient ambulated with no apparent difficulty.  Maintained pulse oximetry at 96% with HR of 94.

## 2015-03-06 NOTE — Consult Note (Signed)
Referring Physician: No referring provider defined for this encounter. Primary Physician: Cathlean Cower, MD Primary Cardiologist: Dr. Burt Knack Reason for Consultation: Palpitations, dizziness and lightheadedness  HPI: Keith Herrera is a 81 y.o. male with a history of CAD, but no arrhythmia. He has normal LV function. He had near syncope on 01/30/2015 and was admitted 3/14-3/17/2016 for further evaluation and treatment. He was held overnight. Bursts of SVT were seen. He was started on a beta blocker and tolerated this well. He was bradycardic at times with a heart rate in the high 40s, but was asymptomatic with this. A TSH had been normal in January and was not rechecked. He had some elevated blood glucoses, but not significantly high or low enough to account for his symptoms. He was discharged with outpatient heart monitor which she sent back on Saturday, 03/04/2015. Patient presented today with worsening dizziness and lightheadedness episodes. Patient is not the best historian and provided somewhat controversial history to me and the emergency room providers. According to the story that I heard to be having multiple episodes of dizziness and lightheadedness usually when she seek Knupp or change in body position though sensation usually lasts few seconds. I specifically ask if those episodes of proceeded by any fluttering in his chest or palpitations. Patient gave report palpitations and flutterings however stated that this is a separate issue for him. Patient reported somewhat poor hydration he reported drinking maybe 1-2 glasses of water a day the rest fluid intake as coffee or diet Dr. Malachi Bonds. He otherwise denied diarrhea, constipation, dysuria, chills, fevers. Interestingly he stated that his initial symptoms of dizziness and lightheadedness started around the time when he had upper he respiratory tract infection.  Check patient orthostatics in the emergency department he complained of dizziness and  lightheadedness when I change his body position from sitting to supine. Supine: Heart rate 65 bpm, blood pressure 149/69 mmHg Sitting heart rate 90 bpm, blood pressure 150/75 mmHg Standing: heart rate 82 bpm, blood pressure 159/80 mmHg   Review of Systems:  12 systems were reviewed nad were negative except mentioned in the HPI      Past Medical History  Diagnosis Date  . GERD (gastroesophageal reflux disease)   . Hiatal hernia   . Hemorrhoids   . Adenomatous polyp of colon 05/2004  . Diverticulosis     colon  . Hypertension   . Hyperlipidemia   . Diabetes type 2, controlled   . Anxiety   . CAD (coronary artery disease)     s/p Cypher DES to LAD and pD1 2010;  LHC was done 6/12: EF 65%, circumflex patent, mid RCA 80-90% (small and nondominant), LAD and diagonal stents patent, LAD at the origin of the first diagonal 30%, ostial D2 90%, mid 80-90% (small vessel).  Continued medical therapy was recommended.   . Right inguinal hernia     s/p repair in 8/12  . Esophageal stricture   . Nephrolithiasis   . ED (erectile dysfunction)   . Allergic rhinitis   . IBS (irritable bowel syndrome)   . BPH (benign prostatic hyperplasia)   . Carotid stenosis     dopplers 02/2012: 6-22% RICA; 29-79% LICA   Past Surgical History  Procedure Laterality Date  . Coronary stent placement  10/2008    x 2  . Hernia repair      RIH with ultrapro patch     Current Medications:   Infusions:  . sodium chloride       (Not  in a hospital admission)   Allergies  Allergen Reactions  . Crestor [Rosuvastatin Calcium] Rash    All over body  . Lovastatin Itching and Rash    History   Social History  . Marital Status: Married    Spouse Name: N/A  . Number of Children: 3  . Years of Education: N/A   Occupational History  . 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 Smoking status: Former Research scientist (life sciences)  . Smokeless tobacco: Not on file  . Alcohol Use: No  . Drug Use: No  .  Sexual Activity: Not on file   Other Topics Concern  . Not on file   Social History Narrative    Family History  Problem Relation Age of Onset  . Cancer Mother 48    unknown cancer  . Cancer Sister 73    unknown cancer   Family Status  Relation Status Death Age  . Mother Deceased 38    cancer  . Father Deceased 60    natural causes  . Brother Deceased 6    unknown  . Sister Deceased 45    cancer    PHYSICAL EXAM: Filed Vitals:   03/06/15 0115  BP: 164/76  Pulse: 68  Temp:   Resp: 16   General:  Well appearing. No respiratory difficulty HEENT: normal Neck: supple. no JVD. Carotids 2+ bilat; no bruits. No lymphadenopathy or thryomegaly appreciated. Cor: PMI nondisplaced. Regular rate & rhythm. No rubs, gallops or murmurs. Lungs: clear Abdomen: soft, nontender, nondistended. No hepatosplenomegaly. No bruits or masses. Good bowel sounds. Extremities: no cyanosis, clubbing, rash, edema Neuro: alert & oriented x 3, cranial nerves grossly intact. moves all 4 extremities w/o difficulty. Affect pleasant.  Results for orders placed or performed during the hospital encounter of 03/05/15 (from the past 24 hour(s))  Basic metabolic panel     Status: Abnormal   Collection Time: 03/05/15 11:10 PM  Result Value Ref Range   Sodium 139 135 - 145 mmol/L   Potassium 3.7 3.5 - 5.1 mmol/L   Chloride 102 96 - 112 mmol/L   CO2 25 19 - 32 mmol/L   Glucose, Bld 194 (H) 70 - 99 mg/dL   BUN 18 6 - 23 mg/dL   Creatinine, Ser 1.04 0.50 - 1.35 mg/dL   Calcium 8.9 8.4 - 10.5 mg/dL   GFR calc non Af Amer 66 (L) >90 mL/min   GFR calc Af Amer 76 (L) >90 mL/min   Anion gap 12 5 - 15  CBC with Differential     Status: None   Collection Time: 03/05/15 11:10 PM  Result Value Ref Range   WBC 6.6 4.0 - 10.5 K/uL   RBC 5.05 4.22 - 5.81 MIL/uL   Hemoglobin 15.3 13.0 - 17.0 g/dL   HCT 44.1 39.0 - 52.0 %   MCV 87.3 78.0 - 100.0 fL   MCH 30.3 26.0 - 34.0 pg   MCHC 34.7 30.0 - 36.0 g/dL   RDW  13.6 11.5 - 15.5 %   Platelets 169 150 - 400 K/uL   Neutrophils Relative % 67 43 - 77 %   Neutro Abs 4.4 1.7 - 7.7 K/uL   Lymphocytes Relative 24 12 - 46 %   Lymphs Abs 1.6 0.7 - 4.0 K/uL   Monocytes Relative 8 3 - 12 %   Monocytes Absolute 0.5 0.1 - 1.0 K/uL   Eosinophils Relative 1 0 - 5 %   Eosinophils Absolute 0.1 0.0 -  0.7 K/uL   Basophils Relative 0 0 - 1 %   Basophils Absolute 0.0 0.0 - 0.1 K/uL  Troponin I     Status: None   Collection Time: 03/05/15 11:10 PM  Result Value Ref Range   Troponin I <0.03 <0.031 ng/mL  BNP (order ONLY if patient complains of dyspnea/SOB AND you have documented it for THIS visit)     Status: None   Collection Time: 03/05/15 11:11 PM  Result Value Ref Range   B Natriuretic Peptide 48.9 0.0 - 100.0 pg/mL   Radiology:  Dg Chest 2 View  03/06/2015   CLINICAL DATA:  Midsternal chest pain with left arm radiation.  EXAM: CHEST  2 VIEW  COMPARISON:  01/30/2015  FINDINGS: Patient is mildly rotated to the left. Thoracic aortic calcification is noted. Cardiac silhouette is within normal limits for size. The patient has taken a greater inspiration than on the prior study. No airspace consolidation, edema, pleural effusion, or pneumothorax is seen. Old right clavicle fracture is again noted. There is moderate thoracolumbar dextroscoliosis.  IMPRESSION: No active cardiopulmonary disease.   Electronically Signed   By: Logan Bores   On: 03/06/2015 00:06    ECG: NSR 70 bpm, normal EKG Bedside ECHO: Normal LV, RV function. IVC is collapsed  ASSESSMENT: Palpitations Dizziness  PLAN/DISCUSSION: Patient is not the best historian however it appears that he has episodes of dizziness that last for a few seconds associated with nausea episodes are somewhat positional patient reported lightheadedness with change patient's position from sitting to laying supine. He reported some fluttering in his chest however those didn't seem to be temporarily related to the dizziness.  Patient reported that his dizziness started about at the same time when he had upper respiratory tract infection and when his allergies flared up am wondering if this is more of an ENT problem then cardiac related, however exam performed by the [facility] physicians wasn't very revealing either. Patient have mild orthostatic changes including heart rate increasing by 25 beats per minutes when he changes body position from supine to standing and he has collapsed IVC on ultrasound exam dehydration be playing a role. She reported poor hydration overall. - Should suggest short observation - Suggest 1 L normal saline bolus - Suggest follow-up with Dr. Lovena Le as planned on Tuesday, 03/07/2015  Inez Pilgrim, MD 03/06/2015 1:49 AM

## 2015-03-06 NOTE — Discharge Instructions (Signed)
We saw you in the ER for the dizziness. All the results in the ER are normal, labs and imaging. We are not sure what is causing your symptoms, suspect it to be related to the heart. The workup in the ER is not complete, and is limited to screening for life threatening and emergent conditions only, so please see the cardiologist for further evaluation.   Palpitations A palpitation is the feeling that your heartbeat is irregular or is faster than normal. It may feel like your heart is fluttering or skipping a beat. Palpitations are usually not a serious problem. However, in some cases, you may need further medical evaluation. CAUSES  Palpitations can be caused by:  Smoking.  Caffeine or other stimulants, such as diet pills or energy drinks.  Alcohol.  Stress and anxiety.  Strenuous physical activity.  Fatigue.  Certain medicines.  Heart disease, especially if you have a history of irregular heart rhythms (arrhythmias), such as atrial fibrillation, atrial flutter, or supraventricular tachycardia.  An improperly working pacemaker or defibrillator. DIAGNOSIS  To find the cause of your palpitations, your health care provider will take your medical history and perform a physical exam. Your health care provider may also have you take a test called an ambulatory electrocardiogram (ECG). An ECG records your heartbeat patterns over a 24-hour period. You may also have other tests, such as:  Transthoracic echocardiogram (TTE). During echocardiography, sound waves are used to evaluate how blood flows through your heart.  Transesophageal echocardiogram (TEE).  Cardiac monitoring. This allows your health care provider to monitor your heart rate and rhythm in real time.  Holter monitor. This is a portable device that records your heartbeat and can help diagnose heart arrhythmias. It allows your health care provider to track your heart activity for several days, if needed.  Stress tests by  exercise or by giving medicine that makes the heart beat faster. TREATMENT  Treatment of palpitations depends on the cause of your symptoms and can vary greatly. Most cases of palpitations do not require any treatment other than time, relaxation, and monitoring your symptoms. Other causes, such as atrial fibrillation, atrial flutter, or supraventricular tachycardia, usually require further treatment. HOME CARE INSTRUCTIONS   Avoid:  Caffeinated coffee, tea, soft drinks, diet pills, and energy drinks.  Chocolate.  Alcohol.  Stop smoking if you smoke.  Reduce your stress and anxiety. Things that can help you relax include:  A method of controlling things in your body, such as your heartbeats, with your mind (biofeedback).  Yoga.  Meditation.  Physical activity such as swimming, jogging, or walking.  Get plenty of rest and sleep. SEEK MEDICAL CARE IF:   You continue to have a fast or irregular heartbeat beyond 24 hours.  Your palpitations occur more often. SEEK IMMEDIATE MEDICAL CARE IF:  You have chest pain or shortness of breath.  You have a severe headache.  You feel dizzy or you faint. MAKE SURE YOU:  Understand these instructions.  Will watch your condition.  Will get help right away if you are not doing well or get worse. Document Released: 11/01/2000 Document Revised: 11/09/2013 Document Reviewed: 01/03/2012 [facility] Patient Information 2015 [place], [place]. This information is not intended to replace advice given to you by your health care provider. Make sure you discuss any questions you have with your health care provider.  Near-Syncope Near-syncope (commonly known as near fainting) is sudden weakness, dizziness, or feeling like you might pass out. During an episode of near-syncope, you may also  develop pale skin, have tunnel vision, or feel sick to your stomach (nauseous). Near-syncope may occur when getting up after sitting or while standing for a long time.  It is caused by a sudden decrease in blood flow to the brain. This decrease can result from various causes or triggers, most of which are not serious. However, because near-syncope can sometimes be a sign of something serious, a medical evaluation is required. The specific cause is often not determined. HOME CARE INSTRUCTIONS  Monitor your condition for any changes. The following actions may help to alleviate any discomfort you are experiencing:  Have someone stay with you until you feel stable.  Lie down right away and prop your feet up if you start feeling like you might faint. Breathe deeply and steadily. Wait until all the symptoms have passed. Most of these episodes last only a few minutes. You may feel tired for several hours.   Drink enough fluids to keep your urine clear or pale yellow.   If you are taking blood pressure or heart medicine, get up slowly when seated or lying down. Take several minutes to sit and then stand. This can reduce dizziness.  Follow up with your health care provider as directed. SEEK IMMEDIATE MEDICAL CARE IF:   You have a severe headache.   You have unusual pain in the chest, abdomen, or back.   You are bleeding from the mouth or rectum, or you have black or tarry stool.   You have an irregular or very fast heartbeat.   You have repeated fainting or have seizure-like jerking during an episode.   You faint when sitting or lying down.   You have confusion.   You have difficulty walking.   You have severe weakness.   You have vision problems.  MAKE SURE YOU:   Understand these instructions.  Will watch your condition.  Will get help right away if you are not doing well or get worse. Document Released: 11/04/2005 Document Revised: 11/09/2013 Document Reviewed: 04/09/2013 [facility] Patient Information 2015 [place], [place]. This information is not intended to replace advice given to you by your health care provider. Make sure you  discuss any questions you have with your health care provider.

## 2015-03-07 ENCOUNTER — Ambulatory Visit (INDEPENDENT_AMBULATORY_CARE_PROVIDER_SITE_OTHER): Payer: Commercial Managed Care - HMO | Admitting: Internal Medicine

## 2015-03-07 ENCOUNTER — Encounter: Payer: Self-pay | Admitting: Internal Medicine

## 2015-03-07 ENCOUNTER — Other Ambulatory Visit: Payer: Self-pay

## 2015-03-07 VITALS — BP 160/64 | HR 63 | Ht 71.0 in | Wt 161.0 lb

## 2015-03-07 DIAGNOSIS — R42 Dizziness and giddiness: Secondary | ICD-10-CM | POA: Insufficient documentation

## 2015-03-07 DIAGNOSIS — I471 Supraventricular tachycardia: Secondary | ICD-10-CM

## 2015-03-07 DIAGNOSIS — I10 Essential (primary) hypertension: Secondary | ICD-10-CM | POA: Diagnosis not present

## 2015-03-07 DIAGNOSIS — Z01812 Encounter for preprocedural laboratory examination: Secondary | ICD-10-CM | POA: Diagnosis not present

## 2015-03-07 DIAGNOSIS — I2510 Atherosclerotic heart disease of native coronary artery without angina pectoris: Secondary | ICD-10-CM | POA: Diagnosis not present

## 2015-03-07 LAB — PROTIME-INR
INR: 1.1 ratio — AB (ref 0.8–1.0)
PROTHROMBIN TIME: 11.7 s (ref 9.6–13.1)

## 2015-03-07 LAB — CBC WITH DIFFERENTIAL/PLATELET
BASOS ABS: 0 10*3/uL (ref 0.0–0.1)
BASOS PCT: 0.3 % (ref 0.0–3.0)
Eosinophils Absolute: 0.1 10*3/uL (ref 0.0–0.7)
Eosinophils Relative: 0.9 % (ref 0.0–5.0)
HEMATOCRIT: 44.8 % (ref 39.0–52.0)
HEMOGLOBIN: 15.3 g/dL (ref 13.0–17.0)
Lymphocytes Relative: 18 % (ref 12.0–46.0)
Lymphs Abs: 1.4 10*3/uL (ref 0.7–4.0)
MCHC: 34.1 g/dL (ref 30.0–36.0)
MCV: 88 fl (ref 78.0–100.0)
MONOS PCT: 7.5 % (ref 3.0–12.0)
Monocytes Absolute: 0.6 10*3/uL (ref 0.1–1.0)
NEUTROS ABS: 5.5 10*3/uL (ref 1.4–7.7)
Neutrophils Relative %: 73.3 % (ref 43.0–77.0)
Platelets: 169 10*3/uL (ref 150.0–400.0)
RBC: 5.09 Mil/uL (ref 4.22–5.81)
RDW: 14.7 % (ref 11.5–15.5)
WBC: 7.5 10*3/uL (ref 4.0–10.5)

## 2015-03-07 LAB — BASIC METABOLIC PANEL
BUN: 18 mg/dL (ref 6–23)
CHLORIDE: 106 meq/L (ref 96–112)
CO2: 29 mEq/L (ref 19–32)
Calcium: 9.4 mg/dL (ref 8.4–10.5)
Creatinine, Ser: 1.03 mg/dL (ref 0.40–1.50)
GFR: 73.67 mL/min (ref >60.00)
Glucose, Bld: 161 mg/dL — ABNORMAL HIGH (ref 70–99)
POTASSIUM: 4 meq/L (ref 3.5–5.1)
Sodium: 140 mEq/L (ref 135–145)

## 2015-03-07 MED ORDER — METOPROLOL TARTRATE 25 MG PO TABS: 12.5000 mg | ORAL_TABLET | Freq: Two times a day (BID) | ORAL | Status: DC

## 2015-03-07 NOTE — Assessment & Plan Note (Signed)
His blood pressure is elevated today. He will stop Cardizem today and restart his beta blocker.

## 2015-03-07 NOTE — Patient Instructions (Addendum)
Medication Instructions:  Your physician has recommended you make the following change in your medication:  1) STOP Diltalziem 2) START Lopressor 12.5 mg (half a tablet) twice a day  Labwork: Your physician recommends that you return for lab work TODAY: (BMP, CBC, PT/INR)   Testing/Procedures: Your physician has recommended that you have an ablation. Catheter ablation is a medical procedure used to treat some cardiac arrhythmias (irregular heartbeats). During catheter ablation, a long, thin, flexible tube is put into a blood vessel in your groin (upper thigh), or neck. This tube is called an ablation catheter. It is then guided to your heart through the blood vessel. Radio frequency waves destroy small areas of heart tissue where abnormal heartbeats may cause an arrhythmia to start. Please see the instruction sheet given to you today.    Follow-Up: To be scheduled after procedure. Cardiac Ablation  Cardiac ablation is a procedure to stop some heart tissue from causing problems. The heart has many electrical connections. Sometimes these connections cause the heart to beat very fast or irregularly. Removing some of the problem areas can improve heart rhythm or make it normal. Ablation is done for people who:   Have Wolff-Parkinson-White syndrome.  Have other fast heart rhythms (tachycardia).  Have taken medicines for an abnormal heart rhythm (arrhythmia) and the medicines had:  No success.  Side effects.  May have a type of heartbeat that could cause death. BEFORE THE PROCEDURE   Follow instructions from your doctor about eating and drinking before the procedure.  Take your medicines as told by your doctor. Take them at regular times with water unless told differently by your doctor.  If you are taking diabetes medicine, ask your doctor how to take it. Ask if there are any special instructions you should follow. Your doctor may change how much insulin you take the day of the  procedure. PROCEDURE   A special type of X-ray will be used. The X-ray helps your doctor see images of your heart during the procedure.  A small cut (incision) will be made in your neck or groin.  An IV tube will be started before the procedure begins.  You will be given a numbing medicine (anesthetic) or a medicine to help you relax (sedative).  The skin on your neck or groin will be numbed.  A needle will be put into a large vein in your neck or groin.  A thin, flexible tube (catheter) will be put in to reach your heart.  A dye will be put in the tube. The dye will show up on X-rays. It will help your doctor see the area of the heart that needs treatment.  When the heart tissue that is causing problems is found, the tip of the tube will send an electrical current to it. This will stop it from causing problems.  The tube will be taken out.  Pressure will be put on the area where the tube was. This will keep it from bleeding. A bandage will be placed over the area. AFTER THE PROCEDURE  You will be taken to a recovery area. Your blood pressure, heart rate, and breathing will be watched. The area where the tube was will also be watched for bleeding.  You will need to lie still for 4-6 hours. This keeps the area where the tube was from bleeding. Document Released: 07/07/2013 Document Revised: 03/21/2014 Document Reviewed: 07/07/2013 [facility] Patient Information 2015 [place], [place]. This information is not intended to replace advice given to you by  your health care provider. Make sure you discuss any questions you have with your health care provider.   Any Other Special Instructions Will Be Listed Below (If Applicable).

## 2015-03-07 NOTE — Assessment & Plan Note (Signed)
He denies anginal symptoms. He'll continue his current medical therapy. 

## 2015-03-07 NOTE — Assessment & Plan Note (Signed)
He has had dizziness while on Cardizem. It is unclear me whether this is the medication or symptoms experienced at start of SVT. I've asked the patient to stop his Cardizem.

## 2015-03-07 NOTE — Progress Notes (Signed)
HPI Keith Herrera is referred today by Dr. Burt Knack for evaluation of SVT. The patient is a very pleasant 81 year old man with a history of coronary artery disease and hypertension, status post percutaneous coronary intervention. He has had palpitations for many months, and was found to have documented SVT at 190 bpm. The episodes start and stop suddenly. They're associated with dizziness but no frank syncope. He was placed on a calcium channel blocker, and has had headaches and nausea, possibly related to the medication. He continues to have SVT despite medical therapy. He were cardiac monitor which demonstrated bouts of SVT lasting for over a minute at a rate of 192 bpm. Allergies  Allergen Reactions  . Crestor [Rosuvastatin Calcium] Rash    All over body  . Lovastatin Itching and Rash     Current Outpatient Prescriptions  Medication Sig Dispense Refill  . aspirin 81 MG tablet Take 81 mg by mouth every evening.     Marland Kitchen atorvastatin (LIPITOR) 10 MG tablet Take 10 mg by mouth daily.  2  . diltiazem (CARDIZEM CD) 180 MG 24 hr capsule Take 1 capsule (180 mg total) by mouth daily. 30 capsule 11  . glucose blood test strip Use as instructed 100 each 12  . lansoprazole (PREVACID) 30 MG capsule Take 1 capsule (30 mg total) by mouth daily at 12 noon. 90 capsule 3  . losartan (COZAAR) 100 MG tablet Take 100 mg by mouth daily.    . meclizine (ANTIVERT) 12.5 MG tablet Take 12.5 mg by mouth 3 (three) times daily as needed for dizziness.    . metFORMIN (GLUCOPHAGE) 500 MG tablet Take 2 tablets (1,000 mg total) by mouth 2 (two) times daily with a meal. 360 tablet 11   No current facility-administered medications for this visit.     Past Medical History  Diagnosis Date  . GERD (gastroesophageal reflux disease)   . Hiatal hernia   . Hemorrhoids   . Adenomatous polyp of colon 05/2004  . Diverticulosis     colon  . Hypertension   . Hyperlipidemia   . Diabetes type 2, controlled   . Anxiety   .  CAD (coronary artery disease)     s/p Cypher DES to LAD and pD1 2010;  LHC was done 6/12: EF 65%, circumflex patent, mid RCA 80-90% (small and nondominant), LAD and diagonal stents patent, LAD at the origin of the first diagonal 30%, ostial D2 90%, mid 80-90% (small vessel).  Continued medical therapy was recommended.   . Right inguinal hernia     s/p repair in 8/12  . Esophageal stricture   . Nephrolithiasis   . ED (erectile dysfunction)   . Allergic rhinitis   . IBS (irritable bowel syndrome)   . BPH (benign prostatic hyperplasia)   . Carotid stenosis     dopplers 02/2012: 9-47% RICA; 65-46% LICA    ROS:   All systems reviewed and negative except as noted in the HPI.   Past Surgical History  Procedure Laterality Date  . Coronary stent placement  10/2008    x 2  . Hernia repair      RIH with ultrapro patch     Family History  Problem Relation Age of Onset  . Cancer Mother 98    unknown cancer  . Cancer Sister 77    unknown cancer     History   Social History  . Marital Status: Married    Spouse Name: N/A  . Number of Children:  3  . Years of Education: N/A   Occupational History  . 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 Smokeless tobacco: Not on file  . Alcohol Use: No  . Drug Use: No  . Sexual Activity: Not on file   Other Topics Concern  . Not on file   Social History Narrative     BP 160/64 mmHg  Pulse 63  Ht 5\' 11"  (1.803 m)  Wt 161 lb (73.029 kg)  BMI 22.46 kg/m2  Physical Exam:  Well appearing 81 year old man, NAD HEENT: Unremarkable Neck:  7 cm JVD, no thyromegally Back:  No CVA tenderness Lungs:  Clear with no wheezes, rales, or rhonchi HEART:  Regular rate rhythm, no murmurs, no rubs, no clicks Abd:  soft, positive bowel sounds, no organomegally, no rebound, no guarding Ext:  2 plus pulses, no edema, no cyanosis, no clubbing Skin:  No rashes no nodules Neuro:  CN II through  XII intact, motor grossly intact  EKG - old ECG reviewed  - normal sinus rhythm with no ventricular preexcitation  Assess/Plan:

## 2015-03-07 NOTE — Assessment & Plan Note (Signed)
The patient has had recurrent symptoms of SVT despite medical therapy. In addition he has tolerated his Cardizem poorly. I've discussed the treatment options with the patient and his wife. The risk, goals, benefits, and expectations of catheter ablation have been reviewed. He would like to proceed with ablation therapy.

## 2015-03-09 DIAGNOSIS — H5210 Myopia, unspecified eye: Secondary | ICD-10-CM | POA: Diagnosis not present

## 2015-03-09 DIAGNOSIS — I10 Essential (primary) hypertension: Secondary | ICD-10-CM | POA: Diagnosis not present

## 2015-03-09 DIAGNOSIS — H5200 Hypermetropia, unspecified eye: Secondary | ICD-10-CM | POA: Diagnosis not present

## 2015-03-09 DIAGNOSIS — E109 Type 1 diabetes mellitus without complications: Secondary | ICD-10-CM | POA: Diagnosis not present

## 2015-03-14 ENCOUNTER — Other Ambulatory Visit: Payer: Self-pay | Admitting: Internal Medicine

## 2015-03-20 ENCOUNTER — Encounter (HOSPITAL_COMMUNITY): Admission: RE | Payer: Self-pay | Source: Ambulatory Visit

## 2015-03-20 ENCOUNTER — Encounter (HOSPITAL_COMMUNITY): Payer: Self-pay | Admitting: *Deleted

## 2015-03-20 ENCOUNTER — Ambulatory Visit (HOSPITAL_COMMUNITY)
Admission: RE | Admit: 2015-03-20 | Payer: Commercial Managed Care - HMO | Source: Ambulatory Visit | Admitting: Internal Medicine

## 2015-03-20 ENCOUNTER — Encounter (HOSPITAL_COMMUNITY)
Admission: RE | Disposition: A | Discharge status: Home / Self Care | Payer: Commercial Managed Care - HMO | Source: Ambulatory Visit | Attending: Internal Medicine

## 2015-03-20 ENCOUNTER — Ambulatory Visit (HOSPITAL_COMMUNITY)
Admission: RE | Admit: 2015-03-20 | Discharge: 2015-03-20 | Disposition: A | Discharge status: Home / Self Care | Payer: Commercial Managed Care - HMO | Source: Ambulatory Visit | Attending: Internal Medicine | Admitting: Internal Medicine

## 2015-03-20 DIAGNOSIS — Z87891 Personal history of nicotine dependence: Secondary | ICD-10-CM | POA: Insufficient documentation

## 2015-03-20 DIAGNOSIS — E785 Hyperlipidemia, unspecified: Secondary | ICD-10-CM | POA: Diagnosis not present

## 2015-03-20 DIAGNOSIS — K589 Irritable bowel syndrome without diarrhea: Secondary | ICD-10-CM | POA: Diagnosis not present

## 2015-03-20 DIAGNOSIS — Z87442 Personal history of urinary calculi: Secondary | ICD-10-CM | POA: Diagnosis not present

## 2015-03-20 DIAGNOSIS — I471 Supraventricular tachycardia, unspecified: Secondary | ICD-10-CM | POA: Diagnosis present

## 2015-03-20 DIAGNOSIS — E119 Type 2 diabetes mellitus without complications: Secondary | ICD-10-CM | POA: Diagnosis not present

## 2015-03-20 DIAGNOSIS — Z7982 Long term (current) use of aspirin: Secondary | ICD-10-CM | POA: Diagnosis not present

## 2015-03-20 DIAGNOSIS — I10 Essential (primary) hypertension: Secondary | ICD-10-CM | POA: Diagnosis not present

## 2015-03-20 DIAGNOSIS — Z955 Presence of coronary angioplasty implant and graft: Secondary | ICD-10-CM | POA: Diagnosis not present

## 2015-03-20 DIAGNOSIS — F419 Anxiety disorder, unspecified: Secondary | ICD-10-CM | POA: Insufficient documentation

## 2015-03-20 DIAGNOSIS — Z888 Allergy status to other drugs, medicaments and biological substances status: Secondary | ICD-10-CM | POA: Insufficient documentation

## 2015-03-20 DIAGNOSIS — I2510 Atherosclerotic heart disease of native coronary artery without angina pectoris: Secondary | ICD-10-CM | POA: Diagnosis not present

## 2015-03-20 DIAGNOSIS — N400 Enlarged prostate without lower urinary tract symptoms: Secondary | ICD-10-CM | POA: Insufficient documentation

## 2015-03-20 DIAGNOSIS — K219 Gastro-esophageal reflux disease without esophagitis: Secondary | ICD-10-CM | POA: Insufficient documentation

## 2015-03-20 DIAGNOSIS — Z86010 Personal history of colonic polyps: Secondary | ICD-10-CM | POA: Insufficient documentation

## 2015-03-20 HISTORY — PX: ELECTROPHYSIOLOGIC STUDY: SHX172A

## 2015-03-20 LAB — GLUCOSE, CAPILLARY
Glucose-Capillary: 127 mg/dL — ABNORMAL HIGH (ref 70–99)
Glucose-Capillary: 127 mg/dL — ABNORMAL HIGH (ref 70–99)
Glucose-Capillary: 197 mg/dL — ABNORMAL HIGH (ref 70–99)

## 2015-03-20 SURGERY — SUPRAVENTRICULAR TACHYCARDIA ABLATION

## 2015-03-20 SURGERY — A-FLUTTER/A-TACH/SVT ABLATION

## 2015-03-20 MED ORDER — SODIUM CHLORIDE 0.9 % IV SOLN: 250.0000 mL | INTRAVENOUS | Status: DC | PRN

## 2015-03-20 MED ORDER — MIDAZOLAM HCL 5 MG/5ML IJ SOLN
INTRAMUSCULAR | Status: AC
  Filled 2015-03-20: qty 5

## 2015-03-20 MED ORDER — ISOPROTERENOL HCL 0.2 MG/ML IJ SOLN
INTRAMUSCULAR | Status: AC
  Filled 2015-03-20: qty 5

## 2015-03-20 MED ORDER — SODIUM CHLORIDE 0.9 % IV SOLN
INTRAVENOUS | Status: DC
  Administered 2015-03-20: 10:00:00 via INTRAVENOUS

## 2015-03-20 MED ORDER — ISOPROTERENOL HCL 0.2 MG/ML IJ SOLN: 1.0000 mg | INTRAVENOUS | Status: DC | PRN

## 2015-03-20 MED ORDER — ISOPROTERENOL HCL 0.2 MG/ML IJ SOLN
INTRAMUSCULAR | Status: AC
  Filled 2015-03-20: qty 10

## 2015-03-20 MED ORDER — ACETAMINOPHEN 325 MG PO TABS: 650.0000 mg | ORAL_TABLET | ORAL | Status: DC | PRN

## 2015-03-20 MED ORDER — ONDANSETRON HCL 4 MG/2ML IJ SOLN: 4.0000 mg | Freq: Four times a day (QID) | INTRAMUSCULAR | Status: DC | PRN

## 2015-03-20 MED ORDER — FENTANYL CITRATE (PF) 100 MCG/2ML IJ SOLN
INTRAMUSCULAR | Status: DC | PRN
  Administered 2015-03-20 (×2): 12.5 ug via INTRAVENOUS
  Administered 2015-03-20: 25 ug via INTRAVENOUS
  Administered 2015-03-20 (×2): 12.5 ug via INTRAVENOUS

## 2015-03-20 MED ORDER — FENTANYL CITRATE (PF) 100 MCG/2ML IJ SOLN
INTRAMUSCULAR | Status: AC
  Filled 2015-03-20: qty 2

## 2015-03-20 MED ORDER — SODIUM CHLORIDE 0.9 % IJ SOLN: 3.0000 mL | Freq: Two times a day (BID) | INTRAMUSCULAR | Status: DC

## 2015-03-20 MED ORDER — HEPARIN (PORCINE) IN NACL 2-0.9 UNIT/ML-% IJ SOLN
INTRAMUSCULAR | Status: AC
Start: 2015-03-20 — End: 2015-03-20
  Filled 2015-03-20: qty 500

## 2015-03-20 MED ORDER — BUPIVACAINE HCL (PF) 0.25 % IJ SOLN
INTRAMUSCULAR | Status: AC
  Filled 2015-03-20: qty 60

## 2015-03-20 MED ORDER — SODIUM CHLORIDE 0.9 % IJ SOLN: 3.0000 mL | INTRAMUSCULAR | Status: DC | PRN

## 2015-03-20 MED ORDER — MIDAZOLAM HCL 5 MG/5ML IJ SOLN
INTRAMUSCULAR | Status: DC | PRN
  Administered 2015-03-20 (×4): 1 mg via INTRAVENOUS
  Administered 2015-03-20: 2 mg via INTRAVENOUS

## 2015-03-20 MED ORDER — CARVEDILOL 3.125 MG PO TABS: 3.1250 mg | ORAL_TABLET | Freq: Two times a day (BID) | ORAL | Status: DC

## 2015-03-20 SURGICAL SUPPLY — 10 items
CATH HEX JOSEPH 2-5-2 65CM 6F (CATHETERS) ×3 IMPLANT
CATH JOSEPHSON QUAD-ALLRED 6FR (CATHETERS) ×6 IMPLANT
CATH POLARIS X 2.5/5/2.5 DECAP (CATHETERS) ×3 IMPLANT
PACK EP LATEX FREE (CUSTOM PROCEDURE TRAY) ×2
PACK EP LF (CUSTOM PROCEDURE TRAY) ×1 IMPLANT
PAD DEFIB LIFELINK (PAD) ×3 IMPLANT
SHEATH PINNACLE 6F 10CM (SHEATH) ×6 IMPLANT
SHEATH PINNACLE 7F 10CM (SHEATH) ×3 IMPLANT
SHEATH PINNACLE 8F 10CM (SHEATH) ×3 IMPLANT
SHIELD RADPAD SCOOP 12X17 (MISCELLANEOUS) ×3 IMPLANT

## 2015-03-20 NOTE — Progress Notes (Signed)
Spoke with Suanne Marker, said she would look at discharge orders and home medications for Mr Reali

## 2015-03-20 NOTE — Progress Notes (Signed)
Pt discharged following bedrest ending. Right groin site clean and dry. Informed of replacing with bandaid in 24 hours, what to do and call if bleeding occurs, wife present, handed discharge orders and told when to restart meds. Pt verbalized understanding. IV removed

## 2015-03-20 NOTE — Progress Notes (Signed)
Site area: right groin a 8, and two 6 french venous sheaths were removed  Site Prior to Removal:  Level 0  Pressure Applied For 20 MINUTES    Minutes Beginning at 1440p  Manual:   Yes.    Patient Status During Pull:  stable  Post Pull Groin Site:  Level 0  Post Pull Instructions Given:  Yes.    Post Pull Pulses Present:  Yes.    Dressing Applied:  Yes.    Comments:  VS remain stable during sheath pull.  Pt denies any discomfort at site at this time.

## 2015-03-20 NOTE — CV Procedure (Signed)
EP Procedure Note  Procedure: Invasive EP study with isuprel  Pre-procedure diagnosis: recurrent SVT  Post-procedure diagnosis: SVT, non-inducible at EP testing  Description of the procedure: After informed consent was obtained, the patient was prepped and draped in a sterile fashion. IV versed and fentanyl were used for sedation. A 6 Fr. Quad catheter was inserted into the right femoral vein and advanced to the RV. A 6 Fr. Quad catheter was inserted percutaneously into the right femoral vein and advanced into the His bundle region. A 6 Fr. Hexapolar catheter was inserted percutaneously into the right femoral vein and advanced to the CS. After measurement of the baseline intervals, rapid ventricular pacing was carried out from the RV down to 310 ms where VA block was demonstrated. During rapid ventricular pacing, the atrial activation was midline and decremental. Next, programmed ventricular stimulation was carried out from the RV. The S1S2 interval was decreased from 500/440 down to 220 where ventricular refractoriness was observed. During VEST, the atrial activation was midline and decremental. No inducible VT or SVT. Next, rapid atrial pacing was carried out from the CS and decreased from 500 ms down to 290 ms where AV block was demonstrated. During rapid atrial pacing, the PR interval was equal to the RR interval but there was no inducible SVT. Finally, AEST was carried out at a basic drive of 103,013, and 400 ms. The S1S2 interval was decreased form 500/440 down to 500/240 but there was no inducible VT or SVT. At this point isuprel 1-4 mics was infused and additional AEST, RAP and VEST were carried out but there was no inducible SVT. The catheters were removed, hemostasis assured and the patient was returned to his room in satisfactory condition.   Complications: none immediately  Conclusion: no inducible SVT in a patient with prior documented SVT. Will plan on a medication adjustment. If he has  worsening symptoms, will consider repeat ablation in the future.  Mikle Bosworth.D.

## 2015-03-20 NOTE — H&P (View-Only) (Signed)
HPI Keith Herrera is referred today by Dr. Burt Knack for evaluation of SVT. The patient is a very pleasant 81 year old man with a history of coronary artery disease and hypertension, status post percutaneous coronary intervention. He has had palpitations for many months, and was found to have documented SVT at 190 bpm. The episodes start and stop suddenly. They're associated with dizziness but no frank syncope. He was placed on a calcium channel blocker, and has had headaches and nausea, possibly related to the medication. He continues to have SVT despite medical therapy. He were cardiac monitor which demonstrated bouts of SVT lasting for over a minute at a rate of 192 bpm. Allergies  Allergen Reactions  . Crestor [Rosuvastatin Calcium] Rash    All over body  . Lovastatin Itching and Rash     Current Outpatient Prescriptions  Medication Sig Dispense Refill  . aspirin 81 MG tablet Take 81 mg by mouth every evening.     Marland Kitchen atorvastatin (LIPITOR) 10 MG tablet Take 10 mg by mouth daily.  2  . diltiazem (CARDIZEM CD) 180 MG 24 hr capsule Take 1 capsule (180 mg total) by mouth daily. 30 capsule 11  . glucose blood test strip Use as instructed 100 each 12  . lansoprazole (PREVACID) 30 MG capsule Take 1 capsule (30 mg total) by mouth daily at 12 noon. 90 capsule 3  . losartan (COZAAR) 100 MG tablet Take 100 mg by mouth daily.    . meclizine (ANTIVERT) 12.5 MG tablet Take 12.5 mg by mouth 3 (three) times daily as needed for dizziness.    . metFORMIN (GLUCOPHAGE) 500 MG tablet Take 2 tablets (1,000 mg total) by mouth 2 (two) times daily with a meal. 360 tablet 11   No current facility-administered medications for this visit.     Past Medical History  Diagnosis Date  . GERD (gastroesophageal reflux disease)   . Hiatal hernia   . Hemorrhoids   . Adenomatous polyp of colon 05/2004  . Diverticulosis     colon  . Hypertension   . Hyperlipidemia   . Diabetes type 2, controlled   . Anxiety   .  CAD (coronary artery disease)     s/p Cypher DES to LAD and pD1 2010;  LHC was done 6/12: EF 65%, circumflex patent, mid RCA 80-90% (small and nondominant), LAD and diagonal stents patent, LAD at the origin of the first diagonal 30%, ostial D2 90%, mid 80-90% (small vessel).  Continued medical therapy was recommended.   . Right inguinal hernia     s/p repair in 8/12  . Esophageal stricture   . Nephrolithiasis   . ED (erectile dysfunction)   . Allergic rhinitis   . IBS (irritable bowel syndrome)   . BPH (benign prostatic hyperplasia)   . Carotid stenosis     dopplers 02/2012: 5-42% RICA; 70-62% LICA    ROS:   All systems reviewed and negative except as noted in the HPI.   Past Surgical History  Procedure Laterality Date  . Coronary stent placement  10/2008    x 2  . Hernia repair      RIH with ultrapro patch     Family History  Problem Relation Age of Onset  . Cancer Mother 62    unknown cancer  . Cancer Sister 81    unknown cancer     History   Social History  . Marital Status: Married    Spouse Name: N/A  . Number of Children:  3  . Years of Education: N/A   Occupational History  . Recruitment consultant for [employer] History Main Topics  . Smoking status: Former Research scientist (life sciences)  . Smokeless tobacco: Not on file  . Alcohol Use: No  . Drug Use: No  . Sexual Activity: Not on file   Other Topics Concern  . Not on file   Social History Narrative     BP 160/64 mmHg  Pulse 63  Ht 5\' 11"  (1.803 m)  Wt 161 lb (73.029 kg)  BMI 22.46 kg/m2  Physical Exam:  Well appearing 81 year old man, NAD HEENT: Unremarkable Neck:  7 cm JVD, no thyromegally Back:  No CVA tenderness Lungs:  Clear with no wheezes, rales, or rhonchi HEART:  Regular rate rhythm, no murmurs, no rubs, no clicks Abd:  soft, positive bowel sounds, no organomegally, no rebound, no guarding Ext:  2 plus pulses, no edema, no cyanosis, no clubbing Skin:  No rashes no nodules Neuro:  CN II through  XII intact, motor grossly intact  EKG - old ECG reviewed  - normal sinus rhythm with no ventricular preexcitation  Assess/Plan:

## 2015-03-20 NOTE — Interval H&P Note (Signed)
History and Physical Interval Note:  03/20/2015 11:27 AM  Keith Herrera  has presented today for surgery, with the diagnosis of aflutter  The various methods of treatment have been discussed with the patient and family. After consideration of risks, benefits and other options for treatment, the patient has consented to  Procedure(s): A-Flutter/A-Tach/Svt Ablation (N/A) as a surgical intervention .  The patient's history has been reviewed, patient examined, no change in status, stable for surgery.  I have reviewed the patient's chart and labs.  Questions were answered to the patient's satisfaction.     Mikle Bosworth.D.

## 2015-03-20 NOTE — Discharge Summary (Signed)
CARDIOLOGY DISCHARGE SUMMARY   Patient ID: Keith Herrera MRN: [id number] DOB/AGE: Apr 20, 1932 81 y.o.  Admit date: 03/20/2015 Discharge date: 03/22/2015  PCP: Cathlean Cower, MD Primary Cardiologist: Dr Burt Knack Electrophysiologist: Dr Lovena Le  Primary Discharge Diagnosis:  SVT Secondary Discharge Diagnosis:   Procedures: Invasive EP study with isuprel  Hospital Course: Keith Herrera is a 81 y.o. male with a history of CAD and SVT. He was evaluated by Dr Lovena Le for the SVT and ablation was recommended. He came to the hospital for the procedure on 03/20/2015.  He had the EP study with Isuprel but the SVT could not be induced. Meds adjusted, no further workup was indicated. He is considered stable for discharge, to follow up as an outpatient.  Labs:   Lab Results  Component Value Date   WBC 7.5 03/07/2015   HGB 15.3 03/07/2015   HCT 44.8 03/07/2015   MCV 88.0 03/07/2015   PLT 169.0 03/07/2015    B NATRIURETIC PEPTIDE  Date/Time Value Ref Range Status  03/05/2015 11:11 PM 48.9 0.0 - 100.0 pg/mL Final     Radiology: Dg Chest 2 View 03/06/2015   CLINICAL DATA:  Midsternal chest pain with left arm radiation.  EXAM: CHEST  2 VIEW  COMPARISON:  01/30/2015  FINDINGS: Patient is mildly rotated to the left. Thoracic aortic calcification is noted. Cardiac silhouette is within normal limits for size. The patient has taken a greater inspiration than on the prior study. No airspace consolidation, edema, pleural effusion, or pneumothorax is seen. Old right clavicle fracture is again noted. There is moderate thoracolumbar dextroscoliosis.  IMPRESSION: No active cardiopulmonary disease.   Electronically Signed   By: Logan Bores   On: 03/06/2015 00:06    Cardiac Cath: 03/20/2015 Procedure: Invasive EP study with isuprel Pre-procedure diagnosis: recurrent SVT Post-procedure diagnosis: SVT, non-inducible at EP testing Description of the procedure: After informed consent was obtained, the  patient was prepped and draped in a sterile fashion. IV versed and fentanyl were used for sedation. A 6 Fr. Quad catheter was inserted into the right femoral vein and advanced to the RV. A 6 Fr. Quad catheter was inserted percutaneously into the right femoral vein and advanced into the His bundle region. A 6 Fr. Hexapolar catheter was inserted percutaneously into the right femoral vein and advanced to the CS. After measurement of the baseline intervals, rapid ventricular pacing was carried out from the RV down to 310 ms where VA block was demonstrated. During rapid ventricular pacing, the atrial activation was midline and decremental. Next, programmed ventricular stimulation was carried out from the RV. The S1S2 interval was decreased from 500/440 down to 220 where ventricular refractoriness was observed. During VEST, the atrial activation was midline and decremental. No inducible VT or SVT. Next, rapid atrial pacing was carried out from the CS and decreased from 500 ms down to 290 ms where AV block was demonstrated. During rapid atrial pacing, the PR interval was equal to the RR interval but there was no inducible SVT. Finally, AEST was carried out at a basic drive of 409,735, and 400 ms. The S1S2 interval was decreased form 500/440 down to 500/240 but there was no inducible VT or SVT. At this point isuprel 1-4 mics was infused and additional AEST, RAP and VEST were carried out but there was no inducible SVT. The catheters were removed, hemostasis assured and the patient was returned to his room in satisfactory condition.  Complications: none immediately Conclusion: no inducible SVT in  a patient with prior documented SVT. Will plan on a medication adjustment. If he has worsening symptoms, will consider repeat ablation in the future.  FOLLOW UP PLANS AND APPOINTMENTS Allergies  Allergen Reactions  . Crestor [Rosuvastatin Calcium] Rash    All over body  . Lovastatin Itching and Rash     Medication List     STOP taking these medications        amLODipine 5 MG tablet  Commonly known as:  NORVASC     metoprolol tartrate 25 MG tablet  Commonly known as:  LOPRESSOR      TAKE these medications        aspirin EC 81 MG tablet  Take 81 mg by mouth daily after supper.     atorvastatin 10 MG tablet  Commonly known as:  LIPITOR  Take 10 mg by mouth daily.     carvedilol 3.125 MG tablet  Commonly known as:  COREG  Take 1 tablet (3.125 mg total) by mouth 2 (two) times daily with a meal.     glucose blood test strip  Use as instructed     ibuprofen 200 MG tablet  Commonly known as:  ADVIL,MOTRIN  Take 400 mg by mouth every 6 (six) hours as needed (pain).     lansoprazole 30 MG capsule  Commonly known as:  PREVACID  Take 1 capsule (30 mg total) by mouth daily at 12 noon.     levocetirizine 5 MG tablet  Commonly known as:  XYZAL  Take 5 mg by mouth daily after supper.     losartan 100 MG tablet  Commonly known as:  COZAAR  TAKE 1 TABLET EVERY DAY     metFORMIN 500 MG tablet  Commonly known as:  GLUCOPHAGE  Take 2 tablets (1,000 mg total) by mouth 2 (two) times daily with a meal.        Discharge Instructions    Call MD for:  redness, tenderness, or signs of infection (pain, swelling, redness, odor or green/yellow discharge around incision site)    Complete by:  As directed      Diet - low sodium heart healthy    Complete by:  As directed      Increase activity slowly    Complete by:  As directed           Follow-up Information    Follow up with Cristopher Peru, MD.   Specialty:  Cardiology   Why:  The office will call.   Contact information:   [address] [address] [phone]       BRING ALL MEDICATIONS WITH YOU TO FOLLOW UP APPOINTMENTS  Time spent with patient to include physician time: 38 min Signed: Rosaria Ferries, PA-C 03/22/2015, 1:15 PM Co-Sign MD  Mikle Bosworth.D.

## 2015-03-21 ENCOUNTER — Encounter (HOSPITAL_COMMUNITY): Payer: Self-pay | Admitting: Internal Medicine

## 2015-03-21 MED FILL — Isoproterenol HCl Inj 0.2 MG/ML: INTRAMUSCULAR | Qty: 5 | Status: AC

## 2015-03-22 ENCOUNTER — Telehealth: Payer: Self-pay | Admitting: *Deleted

## 2015-03-22 NOTE — Telephone Encounter (Signed)
Pt was on tcm list had SVT & procedure done on 03/20/15. Pt will f/u with his cardiologist Dr. Lovena Le in 2 weeks. Did not schedule tcm appt with pcp...Keith Herrera

## 2015-03-23 ENCOUNTER — Telehealth: Payer: Self-pay | Admitting: Internal Medicine

## 2015-03-23 NOTE — Telephone Encounter (Signed)
New Message   Patient states that the medication (carvedilol 3.125mg ) is making him sick. Pt c/o medication issue:  1. Name of Medication: carvedilol   2. How are you currently taking this medication (dosage and times per day)? 3.125mg  2times a day with meals  3. Are you having a reaction (difficulty breathing--STAT)? Swimming head ,sick on stomach   4. What is your medication issue? blood pressure and his heart

## 2015-03-23 NOTE — Telephone Encounter (Signed)
Spoke with the patient and he says he is having problems with feeling dizzy and his head feels heavy(Metoprolol was stopped due to this).  These are the same symptoms as when he was on Cardizem.  I have discussed with Dr Lovena Le who would like for the patient to come in for BP check and an EKG  Will assess his BP as he was hypotensive at the hospital and see what rhythm he is in.  This happens on and off all day.  His BP today with a manuel cuff is 120/80 per patient.  He will come in tomorrow for EKG

## 2015-03-23 NOTE — Telephone Encounter (Signed)
Dr Lovena Le discussed with Dr Burt Knack and called me back and asked me to have Theodosia Quay, RN follow up with patient tomorrow.  I have called him back and let him know this and will cancel EKG and he will hear from [place] and Dr Burt Knack will follow up

## 2015-03-24 NOTE — Telephone Encounter (Signed)
I spoke with the pt and he feels "good" today.  The pt states that he thinks his symptoms are related to gas build-up. The pt's BP today was 140/68. 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  The pt states he spoke with Dr Lovena Le about this letter prior to his ablation.

## 2015-03-28 MED FILL — Heparin Sodium (Porcine) 2 Unit/ML in Sodium Chloride 0.9%: INTRAMUSCULAR | Qty: 500 | Status: AC

## 2015-03-28 MED FILL — Bupivacaine HCl Preservative Free (PF) Inj 0.25%: INTRAMUSCULAR | Qty: 60 | Status: AC

## 2015-03-31 ENCOUNTER — Encounter: Payer: Self-pay | Admitting: Internal Medicine

## 2015-03-31 ENCOUNTER — Ambulatory Visit (INDEPENDENT_AMBULATORY_CARE_PROVIDER_SITE_OTHER): Payer: Commercial Managed Care - HMO | Admitting: Internal Medicine

## 2015-03-31 VITALS — BP 146/80 | HR 59 | Temp 97.9°F | Ht 71.0 in | Wt 158.5 lb

## 2015-03-31 DIAGNOSIS — I10 Essential (primary) hypertension: Secondary | ICD-10-CM | POA: Diagnosis not present

## 2015-03-31 DIAGNOSIS — I471 Supraventricular tachycardia: Secondary | ICD-10-CM | POA: Diagnosis not present

## 2015-03-31 DIAGNOSIS — R070 Pain in throat: Secondary | ICD-10-CM | POA: Insufficient documentation

## 2015-03-31 NOTE — Progress Notes (Signed)
Pre visit review using our clinic review tool, if applicable. No additional management support is needed unless otherwise documented below in the visit note. 

## 2015-03-31 NOTE — Assessment & Plan Note (Signed)
Pt states BP athome < 140/90, stable overall by history and exam, recent data reviewed with pt, and pt to continue medical treatment as before,  to f/u any worsening symptoms or concerns BP Readings from Last 3 Encounters:  03/31/15 146/80  03/20/15 145/68  03/07/15 160/64

## 2015-03-31 NOTE — Patient Instructions (Addendum)
Please continue all other medications as before, and refills have been done if requested.  Please have the pharmacy call with any other refills you may need.  Please continue your efforts at being more active, low cholesterol diet, and weight control.  Please keep your appointments with your specialists as you may have planned  You will be contacted regarding the referral for: Gastroenterology - Dr Joesph July are given the Driving letter as well

## 2015-03-31 NOTE — Progress Notes (Signed)
Subjective:    Patient ID: Keith Herrera, male    DOB: 04/23/1932, 81 y.o.   MRN: [id number]  HPI  Here with 2-3 wks onset trouble in the back of the throat, feels funny sensation to the back of the throat as if he could just go "urp" it might go away,  has intermittent nausea, ? uncontroled reflux, lost 10 lbs in 6 months, but specifically denies dysphagia. Vomiting, abd pain, dysphagia, n/v, bowel change or blood. /Overall good compliance with treatment, and good medicine tolerability, including several yrs taking the prevacid 30 mg and allergy meds.  Pt denies chest pain, increased sob or doe, wheezing, orthopnea, PND, increased LE swelling, palpitations, dizziness or syncope. Wt Readings from Last 3 Encounters:  03/31/15 158 lb 8 oz (71.895 kg)  03/20/15 165 lb (74.844 kg)  03/07/15 161 lb (73.029 kg)   Pt denies fever, night sweats, loss of appetite, or other constitutional symptoms  Has ongoing recurrent vertigo but none recent. BP at home usuall 130/'s/68 on low dose coreg (now off metoprolol).  CBG's range from 94-109.  Pt denies polydipsia, polyuria, or low sugar symptoms such as weakness or confusion  \ Past Medical History  Diagnosis Date  . GERD (gastroesophageal reflux disease)   . Hiatal hernia   . Hemorrhoids   . Adenomatous polyp of colon 05/2004  . Diverticulosis     colon  . Hypertension   . Hyperlipidemia   . Diabetes type 2, controlled   . Anxiety   . CAD (coronary artery disease)     s/p Cypher DES to LAD and pD1 2010;  LHC was done 6/12: EF 65%, circumflex patent, mid RCA 80-90% (small and nondominant), LAD and diagonal stents patent, LAD at the origin of the first diagonal 30%, ostial D2 90%, mid 80-90% (small vessel).  Continued medical therapy was recommended.   . Right inguinal hernia     s/p repair in 8/12  . Esophageal stricture   . Nephrolithiasis   . ED (erectile dysfunction)   . Allergic rhinitis   . IBS (irritable bowel syndrome)   . BPH (benign  prostatic hyperplasia)   . Carotid stenosis     dopplers 02/2012: 2-22% RICA; 97-98% LICA   Past Surgical History  Procedure Laterality Date  . Coronary stent placement  10/2008    x 2  . Hernia repair      RIH with ultrapro patch  . Electrophysiologic study N/A 03/20/2015    Procedure: A-Flutter/A-Tach/Svt Ablation;  Surgeon: Evans Lance, MD;  Location: [facility] INVASIVE CV LAB CUPID;  Service: Cardiovascular;  Laterality: N/A;    reports that he has quit smoking. He does not have any smokeless tobacco history on file. He reports that he does not drink alcohol or use illicit drugs. family history includes Cancer (age of onset: 15) in his mother; Cancer (age of onset: 51) in his sister. Allergies  Allergen Reactions  . Crestor [Rosuvastatin Calcium] Rash    All over body  . Lovastatin Itching and Rash    Current Outpatient Prescriptions on File Prior to Visit  Medication Sig Dispense Refill  . aspirin EC 81 MG tablet Take 81 mg by mouth daily after supper.    Marland Kitchen atorvastatin (LIPITOR) 10 MG tablet Take 10 mg by mouth daily.  2  . carvedilol (COREG) 3.125 MG tablet Take 1 tablet (3.125 mg total) by mouth 2 (two) times daily with a meal. 60 tablet 11  . glucose blood test strip Use as  instructed 100 each 12  . ibuprofen (ADVIL,MOTRIN) 200 MG tablet Take 400 mg by mouth every 6 (six) hours as needed (pain).    Marland Kitchen lansoprazole (PREVACID) 30 MG capsule Take 1 capsule (30 mg total) by mouth daily at 12 noon. (Patient taking differently: Take 30 mg by mouth daily. ) 90 capsule 3  . levocetirizine (XYZAL) 5 MG tablet Take 5 mg by mouth daily after supper.     . losartan (COZAAR) 100 MG tablet TAKE 1 TABLET EVERY DAY 90 tablet 3  . metFORMIN (GLUCOPHAGE) 500 MG tablet Take 2 tablets (1,000 mg total) by mouth 2 (two) times daily with a meal. 360 tablet 11   No current facility-administered medications on file prior to visit.     Review of Systems  Constitutional: Negative for unusual diaphoresis  or night sweats HENT: Negative for ringing in ear or discharge Eyes: Negative for double vision or worsening visual disturbance.  Respiratory: Negative for choking and stridor.   Gastrointestinal: Negative for vomiting or other signifcant bowel change Genitourinary: Negative for hematuria or change in urine volume.  Musculoskeletal: Negative for other MSK pain or swelling Skin: Negative for color change and worsening wound.  Neurological: Negative for tremors and numbness other than noted  Psychiatric/Behavioral: Negative for decreased concentration or agitation other than above       Objective:   Physical Exam BP 146/80 mmHg  Pulse 59  Temp(Src) 97.9 F (36.6 C) (Oral)  Ht 5\' 11"  (1.803 m)  Wt 158 lb 8 oz (71.895 kg)  BMI 22.12 kg/m2  SpO2 93% VS noted,  Constitutional: Pt appears in no significant distress HENT: Head: NCAT.  Right Ear: External ear normal.  Left Ear: External ear normal.  Eyes: . Pupils are equal, round, and reactive to light. Conjunctivae and EOM are normal Neck: Normal range of motion. Neck supple.  Cardiovascular: Normal rate and regular rhythm.   Pulmonary/Chest: Effort normal and breath sounds without rales or wheezing.  Abd:  Soft, NT, ND, + BS Neurological: Pt is alert. Not confused , motor grossly intact Skin: Skin is warm. No rash, no LE edema Psychiatric: Pt behavior is normal. No agitation.     Assessment & Plan:

## 2015-03-31 NOTE — Assessment & Plan Note (Signed)
Exam benign today, pt very interested in GI eval for EGD, last per Dr Leanora Cover yrs ago, ok to cont PPI, refer GI per pt request

## 2015-03-31 NOTE — Assessment & Plan Note (Signed)
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

## 2015-04-01 ENCOUNTER — Other Ambulatory Visit: Payer: Self-pay | Admitting: Internal Medicine

## 2015-04-14 ENCOUNTER — Encounter: Payer: Self-pay | Admitting: *Deleted

## 2015-04-16 ENCOUNTER — Encounter: Payer: Self-pay | Admitting: Nurse Practitioner

## 2015-04-16 NOTE — Progress Notes (Signed)
Electrophysiology Office Note Date: 04/16/2015  ID:  Keith Herrera, DOB 1932/06/12, MRN [id number]  PCP: Cathlean Cower, MD Primary Cardiologist: Burt Knack Electrophysiologist: Lovena Le  CC: SVT follow up  Keith Herrera is a 81 y.o. male seen today for Dr Lovena Le.  He has had a several month history of palpitations with documented SVT on event monitor.  He was seen in consult by Dr Lovena Le who recommended EPS/ablation.  He underwent EPS 03/2015 with no inducible arrhythmias.  Coreg was started post EPS and he presents today for post hospital follow-up. Since discharge, the patient reports doing very well. 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 He is also very active gardening and working around the house. He has had some orthostatic intolerance but denies chest pain, palpitations, dyspnea, PND, orthopnea, nausea, vomiting, syncope.  Past Medical History  Diagnosis Date  . GERD (gastroesophageal reflux disease)   . Hiatal hernia   . Hemorrhoids   . Adenomatous polyp of colon 05/2004  . Diverticulosis     colon  . Hypertension   . Hyperlipidemia   . Diabetes type 2, controlled   . Anxiety   . CAD (coronary artery disease)     s/p Cypher DES to LAD and pD1 2010;  LHC was done 6/12: EF 65%, circumflex patent, mid RCA 80-90% (small and nondominant), LAD and diagonal stents patent, LAD at the origin of the first diagonal 30%, ostial D2 90%, mid 80-90% (small vessel).  Continued medical therapy was recommended.   . Right inguinal hernia     s/p repair in 8/12  . Esophageal stricture   . ED (erectile dysfunction)   . Allergic rhinitis   . IBS (irritable bowel syndrome)   . BPH (benign prostatic hyperplasia)   . Carotid stenosis     dopplers 02/2012: 9-79% RICA; 89-21% LICA  . SVT (supraventricular tachycardia)    Past Surgical History  Procedure Laterality Date  . Coronary stent placement  10/2008    x 2  . Hernia repair      RIH with ultrapro patch  .  Electrophysiologic study N/A 03/20/2015    no inducible SVT - Dr Lovena Le    Current Outpatient Prescriptions  Medication Sig Dispense Refill  . amLODipine (NORVASC) 5 MG tablet     . aspirin EC 81 MG tablet Take 81 mg by mouth daily after supper.    Marland Kitchen atorvastatin (LIPITOR) 10 MG tablet Take 10 mg by mouth daily.  2  . carvedilol (COREG) 3.125 MG tablet Take 1 tablet (3.125 mg total) by mouth 2 (two) times daily with a meal. 60 tablet 11  . glucose blood test strip Use as instructed 100 each 12  . ibuprofen (ADVIL,MOTRIN) 200 MG tablet Take 400 mg by mouth every 6 (six) hours as needed (pain).    Marland Kitchen lansoprazole (PREVACID) 30 MG capsule Take 1 capsule (30 mg total) by mouth daily at 12 noon. (Patient taking differently: Take 30 mg by mouth daily. ) 90 capsule 3  . levocetirizine (XYZAL) 5 MG tablet Take 5 mg by mouth daily after supper.     . losartan (COZAAR) 100 MG tablet TAKE 1 TABLET EVERY DAY 90 tablet 3  . meclizine (ANTIVERT) 12.5 MG tablet TAKE 1 TABLET (12.5 MG TOTAL) BY MOUTH 3 (THREE) TIMES DAILY AS NEEDED FOR DIZZINESS. 30 tablet 1  . metFORMIN (GLUCOPHAGE) 500 MG tablet Take 2 tablets (1,000 mg total) by mouth 2 (two) times daily with a meal. 360  tablet 11   No current facility-administered medications for this visit.    Allergies:   Crestor and Lovastatin   Social History: History   Social History  . Marital Status: Married    Spouse Name: N/A  . Number of Children: 3  . Years of Education: N/A   Occupational History  . Recruitment consultant for [employer] History Main Topics  . Smoking status: Former Research scientist (life sciences)  . Smokeless tobacco: Not on file  . Alcohol Use: No  . Drug Use: No  . Sexual Activity: Not on file   Other Topics Concern  . Not on file   Social History Narrative    Family History: Family History  Problem Relation Age of Onset  . Cancer Mother 80    unknown cancer  . Cancer Sister 70    unknown cancer    Review of Systems: All other  systems reviewed and are otherwise negative except as noted above.   Physical Exam: VS:  There were no vitals taken for this visit. , BMI There is no weight on file to calculate BMI. Wt Readings from Last 3 Encounters:  03/31/15 158 lb 8 oz (71.895 kg)  03/20/15 165 lb (74.844 kg)  03/07/15 161 lb (73.029 kg)    GEN- The patient is elderly appearing, alert and oriented x 3 today.   HEENT: normocephalic, atraumatic; sclera clear, conjunctiva pink; hearing intact; oropharynx clear; neck supple, no JVP Lymph- no cervical lymphadenopathy Lungs- Clear to ausculation bilaterally, normal work of breathing.  No wheezes, rales, rhonchi Heart- Regular rate and rhythm, no murmurs, rubs or gallops, rare extrasystoles (asymptomatic) GI- soft, non-tender, non-distended, bowel sounds present Extremities- no clubbing, cyanosis, or edema; DP/PT/radial pulses 2+ bilaterally MS- no significant deformity or atrophy Skin- warm and dry, no rash or lesion  Psych- euthymic mood, full affect Neuro- strength and sensation are intact   EKG:  EKG is ordered today. The ekg ordered today shows sinus bradycardia, rate 56, normal intervals  Recent Labs: 12/13/2014: ALT 23; TSH 1.71 03/05/2015: B Natriuretic Peptide 48.9 03/07/2015: BUN 18; Creatinine 1.03; Hemoglobin 15.3; Platelets 169.0; Potassium 4.0; Sodium 140    Other studies Reviewed: Additional studies/ records that were reviewed today include: hospital records  Assessment and Plan: 1.  SVT The patient has documented SVT but no inducible arrhythmias at EPS 03/2015.  Coreg was added post EPS and he has had significantly decreased palpitations since then.   Continue medical therapy.  If recurrent persistent tachy-palpitations, can consider increasing Coreg or repeat EPS.  Would favor increasing Coreg and discontinuing Norvasc with orthostatic intolerance.  Will need to be mindful of relative bradycardia, although the patient is asymptomatic at this point.     2.  CAD No recent ischemic symptoms Continue BB/ASA/statin  3.  HTN Stable No change required today Would prefer to continue BB with CAD and ARB with diabetes. If need to increase Coreg for palpitations, would decrease Amlodipine with orthostatic intolerance.   Current medicines are reviewed at length with the patient today.   The patient does not have concerns regarding his medicines.  The following changes were made today:  none  Labs/ tests ordered today include: none    Disposition:   Follow up with Dr Burt Knack September 2016, follow up with Dr Lovena Le as needed for recurrent symptoms.    Signed, Chanetta Marshall, NP 04/16/2015 6:38 PM   [address] [place] [address] [phone] (office) [phone] (fax)

## 2015-04-19 ENCOUNTER — Encounter: Payer: Self-pay | Admitting: Nurse Practitioner

## 2015-04-19 ENCOUNTER — Ambulatory Visit (INDEPENDENT_AMBULATORY_CARE_PROVIDER_SITE_OTHER): Payer: Commercial Managed Care - HMO | Admitting: Nurse Practitioner

## 2015-04-19 VITALS — BP 132/80 | HR 56 | Ht 71.0 in | Wt 166.8 lb

## 2015-04-19 DIAGNOSIS — I471 Supraventricular tachycardia: Secondary | ICD-10-CM

## 2015-04-19 DIAGNOSIS — I10 Essential (primary) hypertension: Secondary | ICD-10-CM | POA: Diagnosis not present

## 2015-04-19 NOTE — Patient Instructions (Signed)
Medication Instructions:  Your physician recommends that you continue on your current medications as directed. Please refer to the Current Medication list given to you today.   Labwork: None   Testing/Procedures: None   Follow-Up: Your physician recommends that you schedule a follow-up appointment in: Sept 2016 with Moore  Your physician recommends that you schedule a follow-up appointment as need with Dr.Taylor (for palpitations)    Any Other Special Instructions Will Be Listed Below (If Applicable).

## 2015-05-24 ENCOUNTER — Encounter: Payer: Self-pay | Admitting: Gastroenterology

## 2015-06-13 ENCOUNTER — Ambulatory Visit: Payer: Commercial Managed Care - HMO | Admitting: Internal Medicine

## 2015-06-14 ENCOUNTER — Other Ambulatory Visit: Payer: Self-pay | Admitting: Internal Medicine

## 2015-06-14 ENCOUNTER — Other Ambulatory Visit (INDEPENDENT_AMBULATORY_CARE_PROVIDER_SITE_OTHER): Payer: Commercial Managed Care - HMO

## 2015-06-14 ENCOUNTER — Ambulatory Visit (INDEPENDENT_AMBULATORY_CARE_PROVIDER_SITE_OTHER): Payer: Commercial Managed Care - HMO | Admitting: Internal Medicine

## 2015-06-14 ENCOUNTER — Encounter: Payer: Self-pay | Admitting: Internal Medicine

## 2015-06-14 VITALS — BP 136/84 | HR 64 | Temp 97.5°F | Wt 162.2 lb

## 2015-06-14 DIAGNOSIS — E119 Type 2 diabetes mellitus without complications: Secondary | ICD-10-CM

## 2015-06-14 DIAGNOSIS — Z0189 Encounter for other specified special examinations: Secondary | ICD-10-CM | POA: Diagnosis not present

## 2015-06-14 DIAGNOSIS — E785 Hyperlipidemia, unspecified: Secondary | ICD-10-CM | POA: Diagnosis not present

## 2015-06-14 DIAGNOSIS — I10 Essential (primary) hypertension: Secondary | ICD-10-CM

## 2015-06-14 DIAGNOSIS — Z0000 Encounter for general adult medical examination without abnormal findings: Secondary | ICD-10-CM

## 2015-06-14 LAB — LIPID PANEL
CHOL/HDL RATIO: 4
CHOLESTEROL: 131 mg/dL (ref 0–200)
HDL: 31.6 mg/dL — ABNORMAL LOW (ref >39.00)
LDL Cholesterol: 69 mg/dL (ref 0–99)
NONHDL: 99.4
TRIGLYCERIDES: 154 mg/dL — AB (ref 0.0–149.0)
VLDL: 30.8 mg/dL (ref 0.0–40.0)

## 2015-06-14 LAB — BASIC METABOLIC PANEL
BUN: 16 mg/dL (ref 6–23)
CO2: 28 mEq/L (ref 19–32)
Calcium: 9.1 mg/dL (ref 8.4–10.5)
Chloride: 105 mEq/L (ref 96–112)
Creatinine, Ser: 1.05 mg/dL (ref 0.40–1.50)
GFR: 72.01 mL/min (ref >60.00)
Glucose, Bld: 182 mg/dL — ABNORMAL HIGH (ref 70–99)
Potassium: 4.2 mEq/L (ref 3.5–5.1)
SODIUM: 142 meq/L (ref 135–145)

## 2015-06-14 LAB — HEMOGLOBIN A1C: HEMOGLOBIN A1C: 7.7 % — AB (ref 4.6–6.5)

## 2015-06-14 LAB — HEPATIC FUNCTION PANEL
ALK PHOS: 42 U/L (ref 39–117)
ALT: 23 U/L (ref 0–53)
AST: 19 U/L (ref 0–37)
Albumin: 3.9 g/dL (ref 3.5–5.2)
BILIRUBIN DIRECT: 0.2 mg/dL (ref 0.0–0.3)
Total Bilirubin: 0.7 mg/dL (ref 0.2–1.2)
Total Protein: 6.6 g/dL (ref 6.0–8.3)

## 2015-06-14 MED ORDER — METFORMIN HCL 500 MG PO TABS: 500.0000 mg | ORAL_TABLET | Freq: Two times a day (BID) | ORAL | Status: DC

## 2015-06-14 NOTE — Assessment & Plan Note (Signed)
stable overall by history and exam, recent data reviewed with pt, and pt to continue medical treatment as before,  to f/u any worsening symptoms or concerns Lab Results  Component Value Date   LDLCALC 79 12/21/2012   For f/u labs

## 2015-06-14 NOTE — Assessment & Plan Note (Signed)
stable overall by history and exam, recent data reviewed with pt, and pt to continue medical treatment as before,  to f/u any worsening symptoms or concerns BP Readings from Last 3 Encounters:  06/14/15 136/84  04/19/15 132/80  03/31/15 146/80

## 2015-06-14 NOTE — Progress Notes (Signed)
Subjective:    Patient ID: Keith Herrera, male    DOB: November 27, 1931, 81 y.o.   MRN: [id number]  HPI  Here to f/u; overall doing ok,  Pt denies chest pain, increasing sob or doe, wheezing, orthopnea, PND, increased LE swelling, palpitations, dizziness or syncope.  Pt denies new neurological symptoms such as new headache, or facial or extremity weakness or numbness.  Pt denies polydipsia, polyuria, or low sugar episode.   Pt denies new neurological symptoms such as new headache, or facial or extremity weakness or numbness.   Pt states overall good compliance with meds, mostly trying to follow appropriate diet, with wt overall stable,  but little exercise however.  Does have some sensation for years of fullness behind the back of the tongue, no paino pain or dysphaiga.  Had ENT eval several yrs ago with normal exam per pt Past Medical History  Diagnosis Date  . GERD (gastroesophageal reflux disease)   . Hiatal hernia   . Hemorrhoids   . Adenomatous polyp of colon 05/2004  . Diverticulosis     colon  . Hypertension   . Hyperlipidemia   . Diabetes type 2, controlled   . Anxiety   . CAD (coronary artery disease)     s/p Cypher DES to LAD and pD1 2010;  LHC was done 6/12: EF 65%, circumflex patent, mid RCA 80-90% (small and nondominant), LAD and diagonal stents patent, LAD at the origin of the first diagonal 30%, ostial D2 90%, mid 80-90% (small vessel).  Continued medical therapy was recommended.   . Right inguinal hernia     s/p repair in 8/12  . Esophageal stricture   . ED (erectile dysfunction)   . Allergic rhinitis   . IBS (irritable bowel syndrome)   . BPH (benign prostatic hyperplasia)   . Carotid stenosis     dopplers 02/2012: 5-46% RICA; 50-35% LICA  . SVT (supraventricular tachycardia)    Past Surgical History  Procedure Laterality Date  . Coronary stent placement  10/2008    x 2  . Hernia repair      RIH with ultrapro patch  . Electrophysiologic study N/A 03/20/2015    no  inducible SVT - Dr Lovena Le    reports that he has quit smoking. He does not have any smokeless tobacco history on file. He reports that he does not drink alcohol or use illicit drugs. family history includes Cancer (age of onset: 61) in his mother; Cancer (age of onset: 53) in his sister. Allergies  Allergen Reactions  . Crestor [Rosuvastatin Calcium] Rash    All over body  . Lovastatin Itching and Rash   Current Outpatient Prescriptions on File Prior to Visit  Medication Sig Dispense Refill  . amLODipine (NORVASC) 5 MG tablet Take 5 mg by mouth daily.     Marland Kitchen aspirin EC 81 MG tablet Take 81 mg by mouth daily after supper.    Marland Kitchen atorvastatin (LIPITOR) 10 MG tablet Take 10 mg by mouth daily.  2  . carvedilol (COREG) 3.125 MG tablet Take 1 tablet (3.125 mg total) by mouth 2 (two) times daily with a meal. 60 tablet 11  . glucose blood test strip Use as instructed 100 each 12  . ibuprofen (ADVIL,MOTRIN) 200 MG tablet Take 400 mg by mouth every 6 (six) hours as needed (pain).    Marland Kitchen lansoprazole (PREVACID) 30 MG capsule Take 30 mg by mouth daily at 12 noon.    Marland Kitchen losartan (COZAAR) 100 MG tablet TAKE 1  TABLET EVERY DAY 90 tablet 3  . metFORMIN (GLUCOPHAGE) 500 MG tablet Take 500 mg by mouth daily with breakfast.     No current facility-administered medications on file prior to visit.    Review of Systems  Constitutional: Negative for unusual diaphoresis or night sweats HENT: Negative for ringing in ear or discharge Eyes: Negative for double vision or worsening visual disturbance.  Respiratory: Negative for choking and stridor.   Gastrointestinal: Negative for vomiting or other signifcant bowel change Genitourinary: Negative for hematuria or change in urine volume.  Musculoskeletal: Negative for other MSK pain or swelling Skin: Negative for color change and worsening wound.  Neurological: Negative for tremors and numbness other than noted  Psychiatric/Behavioral: Negative for decreased  concentration or agitation other than above       Objective:   Physical Exam BP 136/84 mmHg  Pulse 64  Temp(Src) 97.5 F (36.4 C)  Wt 162 lb 4 oz (73.596 kg)  SpO2 98% VS noted,  Constitutional: Pt appears in no significant distress HENT: Head: NCAT.  Right Ear: External ear normal.  Left Ear: External ear normal.  Eyes: . Pupils are equal, round, and reactive to light. Conjunctivae and EOM are normal. Mouth without mass or tongue abnormality, no pharyngeal mass or swelling or erythema Neck: Normal range of motion. Neck supple.  Cardiovascular: Normal rate and regular rhythm.   Pulmonary/Chest: Effort normal and breath sounds without rales or wheezing.  Abd:  Soft, NT, ND, + BS Neurological: Pt is alert. Not confused , motor grossly intact Skin: Skin is warm. No rash, no LE edema Psychiatric: Pt behavior is normal. No agitation.     Assessment & Plan:

## 2015-06-14 NOTE — Progress Notes (Signed)
Pre visit review using our clinic review tool, if applicable. No additional management support is needed unless otherwise documented below in the visit note. 

## 2015-06-14 NOTE — Patient Instructions (Signed)

## 2015-06-14 NOTE — Assessment & Plan Note (Signed)
stable overall by history and exam, recent data reviewed with pt, and pt to continue medical treatment as before,  to f/u any worsening symptoms or concerns Lab Results  Component Value Date   HGBA1C 8.7* 12/13/2014    for f/u lab today

## 2015-06-15 ENCOUNTER — Telehealth: Payer: Self-pay | Admitting: Internal Medicine

## 2015-06-15 NOTE — Telephone Encounter (Signed)
Pt wanted to know if PCP called in any new medications after OV 06/14/2015. Pt advised that no new medications were Rx'd

## 2015-06-15 NOTE — Telephone Encounter (Signed)
Patient was in yesterday and he has questions about his medications. He received a new medication and he is not sure if he needs to keep taking all or if this replaced another medication Please call him at [phone]

## 2015-06-19 ENCOUNTER — Ambulatory Visit: Payer: Commercial Managed Care - HMO | Admitting: Gastroenterology

## 2015-07-28 ENCOUNTER — Other Ambulatory Visit: Payer: Self-pay | Admitting: Internal Medicine

## 2015-08-28 ENCOUNTER — Ambulatory Visit: Payer: Commercial Managed Care - HMO | Admitting: Gastroenterology

## 2015-09-15 ENCOUNTER — Other Ambulatory Visit: Payer: Self-pay | Admitting: Internal Medicine

## 2015-11-24 ENCOUNTER — Encounter: Payer: Self-pay | Admitting: Internal Medicine

## 2015-11-24 ENCOUNTER — Ambulatory Visit (INDEPENDENT_AMBULATORY_CARE_PROVIDER_SITE_OTHER): Payer: Commercial Managed Care - HMO | Admitting: Internal Medicine

## 2015-11-24 VITALS — BP 158/70 | HR 66 | Temp 98.2°F | Ht 71.0 in | Wt 167.2 lb

## 2015-11-24 DIAGNOSIS — I10 Essential (primary) hypertension: Secondary | ICD-10-CM | POA: Diagnosis not present

## 2015-11-24 DIAGNOSIS — M545 Low back pain, unspecified: Secondary | ICD-10-CM

## 2015-11-24 DIAGNOSIS — E119 Type 2 diabetes mellitus without complications: Secondary | ICD-10-CM

## 2015-11-24 MED ORDER — TIZANIDINE HCL 4 MG PO TABS
4.0000 mg | ORAL_TABLET | Freq: Four times a day (QID) | ORAL | Status: DC | PRN
Start: 2015-11-24 — End: 2016-01-09

## 2015-11-24 NOTE — Progress Notes (Signed)
Pre visit review using our clinic review tool, if applicable. No additional management support is needed unless otherwise documented below in the visit note. 

## 2015-11-24 NOTE — Assessment & Plan Note (Signed)
C/w most likely msk strain, possibly related to underlying lumbar djd or ddd, declines UA, for tizanidine prn,  to f/u any worsening symptoms or concerns

## 2015-11-24 NOTE — Assessment & Plan Note (Signed)
Mild elev, ok to increase the coreg to 6.25 bid, o/w stable overall by history and exam, recent data reviewed with pt, and pt to continue medical treatment as before,  to f/u any worsening symptoms or concerns BP Readings from Last 3 Encounters:  11/24/15 158/70  06/14/15 136/84  04/19/15 132/80

## 2015-11-24 NOTE — Assessment & Plan Note (Signed)
stable overall by history and exam, recent data reviewed with pt, and pt to continue medical treatment as before,  to f/u any worsening symptoms or concerns Lab Results  Component Value Date   HGBA1C 7.7* 06/14/2015

## 2015-11-24 NOTE — Progress Notes (Signed)
Subjective:    Patient ID: Keith Herrera, male    DOB: 1932-06-03, 82 y.o.   MRN: [id number]  HPI  Here to f/u; overall doing ok,  Pt denies chest pain, increasing sob or doe, wheezing, orthopnea, PND, increased LE swelling, palpitations, dizziness or syncope.  Pt denies new neurological symptoms such as new headache, or facial or extremity weakness or numbness.  Pt denies polydipsia, polyuria, or low sugar episode.   Pt denies new neurological symptoms such as new headache, or facial or extremity weakness or numbness.   Pt states overall good compliance with meds, mostly trying to follow appropriate diet, with wt overall stable,  but little exercise however.  Was supposed to increase the metformiin to bid last visit but sometimes holds off in the afternoon after a day of being more active, such as yardwork. Sugars mostly in teh 120-140 range when checked.   BP at home seem to ave about 140/78  BP Readings from Last 3 Encounters:  11/24/15 158/70  06/14/15 136/84  04/19/15 132/80   Has nocturia x 2 most nights, up to 4 with coffee in the evening.    Pt continues to have left LBP x 3 wks without change in severity, bowel or bladder change, fever, wt loss,  worsening LE pain/numbness/weakness, gait change or falls. Started after misstepped during yardwork. Worse pain to standing up from sitting, or getting in and out of cars. Little pain at all with sitting and driving. Denies urinary symptoms such as dysuria, frequency, urgency, flank pain, hematuria or n/v, fever, chills. Past Medical History  Diagnosis Date  . GERD (gastroesophageal reflux disease)   . Hiatal hernia   . Hemorrhoids   . Adenomatous polyp of colon 05/2004  . Diverticulosis     colon  . Hypertension   . Hyperlipidemia   . Diabetes type 2, controlled ([place])   . Anxiety   . CAD (coronary artery disease)     s/p Cypher DES to LAD and pD1 2010;  LHC was done 6/12: EF 65%, circumflex patent, mid RCA 80-90% (small and  nondominant), LAD and diagonal stents patent, LAD at the origin of the first diagonal 30%, ostial D2 90%, mid 80-90% (small vessel).  Continued medical therapy was recommended.   . Right inguinal hernia     s/p repair in 8/12  . Esophageal stricture   . ED (erectile dysfunction)   . Allergic rhinitis   . IBS (irritable bowel syndrome)   . BPH (benign prostatic hyperplasia)   . Carotid stenosis     dopplers 02/2012: [id number] RICA; [id number] LICA  . SVT (supraventricular tachycardia) [facility])    Past Surgical History  Procedure Laterality Date  . Coronary stent placement  10/2008    x 2  . Hernia repair      RIH with ultrapro patch  . Electrophysiologic study N/A 03/20/2015    no inducible SVT - Dr Lovena Le    reports that he has quit smoking. He does not have any smokeless tobacco history on file. He reports that he does not drink alcohol or use illicit drugs. family history includes Cancer (age of onset: 63) in his mother; Cancer (age of onset: 43) in his sister. Allergies  Allergen Reactions  . Crestor [Rosuvastatin Calcium] Rash    All over body  . Lovastatin Itching and Rash   Current Outpatient Prescriptions on File Prior to Visit  Medication Sig Dispense Refill  . amLODipine (NORVASC) 5 MG tablet Take 5 mg by mouth  daily.     . aspirin EC 81 MG tablet Take 81 mg by mouth daily after supper.    Marland Kitchen atorvastatin (LIPITOR) 10 MG tablet Take 10 mg by mouth daily.  2  . glucose blood test strip Use as instructed 100 each 12  . ibuprofen (ADVIL,MOTRIN) 200 MG tablet Take 400 mg by mouth every 6 (six) hours as needed (pain).    Marland Kitchen lansoprazole (PREVACID) 30 MG capsule Take 30 mg by mouth daily at 12 noon.    Marland Kitchen losartan (COZAAR) 100 MG tablet TAKE 1 TABLET EVERY DAY 90 tablet 3  . metFORMIN (GLUCOPHAGE) 500 MG tablet Take 1 tablet (500 mg total) by mouth 2 (two) times daily with a meal. 180 tablet 3   No current facility-administered medications on file prior to visit.    Review of Systems   Constitutional: Negative for unusual diaphoresis or night sweats HENT: Negative for ringing in ear or discharge Eyes: Negative for double vision or worsening visual disturbance.  Respiratory: Negative for choking and stridor.   Gastrointestinal: Negative for vomiting or other signifcant bowel change Genitourinary: Negative for hematuria or change in urine volume.  Musculoskeletal: Negative for other MSK pain or swelling Skin: Negative for color change and worsening wound.  Neurological: Negative for tremors and numbness other than noted  Psychiatric/Behavioral: Negative for decreased concentration or agitation other than above       Objective:   Physical Exam BP 158/70 mmHg  Pulse 66  Temp(Src) 98.2 F (36.8 C) (Oral)  Ht 5\' 11"  (1.803 m)  Wt 167 lb 4 oz (75.864 kg)  BMI 23.34 kg/m2  SpO2 98% VS noted, not ill appering Constitutional: Pt appears in no significant distress HENT: Head: NCAT.  Right Ear: External ear normal.  Left Ear: External ear normal.  Eyes: . Pupils are equal, round, and reactive to light. Conjunctivae and EOM are normal Neck: Normal range of motion. Neck supple.  Cardiovascular: Normal rate and regular rhythm.   Pulmonary/Chest: Effort normal and breath sounds without rales or wheezing.  Abd:  Soft, NT, ND, + BS Spine nontender, also paraspinal areas without tender or swelling, no redness or rash Neurological: Pt is alert. Not confused , motor 5/5intact, gait/dtr intact Skin: Skin is warm. No rash, no LE edema Psychiatric: Pt behavior is normal. No agitation.       Assessment & Plan:

## 2015-11-24 NOTE — Patient Instructions (Signed)
Please take all new medication as prescribed - the tizanidine muscle relaxer as needed  OK to increase the coreg (carvedilol) to 6.25 mg twice per day  Please continue all other medications as before, and refills have been done if requested.  Please have the pharmacy call with any other refills you may need.  Please continue your efforts at being more active, diabetic low cholesterol diet, and weight control.  Please keep your appointments with your specialists as you may have planned

## 2015-11-28 ENCOUNTER — Encounter: Payer: Self-pay | Admitting: Internal Medicine

## 2015-11-28 ENCOUNTER — Ambulatory Visit (INDEPENDENT_AMBULATORY_CARE_PROVIDER_SITE_OTHER): Payer: Commercial Managed Care - HMO | Admitting: Internal Medicine

## 2015-11-28 VITALS — BP 174/86 | HR 55 | Ht 71.0 in | Wt 167.8 lb

## 2015-11-28 DIAGNOSIS — R002 Palpitations: Secondary | ICD-10-CM

## 2015-11-28 DIAGNOSIS — I471 Supraventricular tachycardia: Secondary | ICD-10-CM

## 2015-11-28 DIAGNOSIS — I10 Essential (primary) hypertension: Secondary | ICD-10-CM

## 2015-11-28 DIAGNOSIS — I2510 Atherosclerotic heart disease of native coronary artery without angina pectoris: Secondary | ICD-10-CM | POA: Diagnosis not present

## 2015-11-28 NOTE — Assessment & Plan Note (Signed)
He is still having palpitations. I have asked him to wear a 48 hour holter. Additional rec's will depend on the result of his monitor. I suspect he will ultimately need PPM followed by uptitration of his medical therapy for both SVT and HTN.

## 2015-11-28 NOTE — Progress Notes (Signed)
HPI [name] returns today for followup of SVT. The patient is a very pleasant 82 year old man with a history of coronary artery disease and hypertension, status post percutaneous coronary intervention. He has undergone EP study but no ablation as he had no inducible SVT. In the interim, he has had problems with palpitations and HTN. His blood pressure has been high. He had been on coreg but sounds like he had symptomatic bradycardia.  Allergies  Allergen Reactions  . Crestor [Rosuvastatin Calcium] Rash    All over body  . Lovastatin Itching and Rash     Current Outpatient Prescriptions  Medication Sig Dispense Refill  . amLODipine (NORVASC) 5 MG tablet Take 5 mg by mouth daily.     Marland Kitchen aspirin EC 81 MG tablet Take 81 mg by mouth daily after supper.    Marland Kitchen atorvastatin (LIPITOR) 10 MG tablet Take 10 mg by mouth daily.  2  . carvedilol (COREG) 3.125 MG tablet Take 3.125 mg by mouth. Take 2 tablets 2 times daily    . glucose blood test strip Use as instructed 100 each 12  . ibuprofen (ADVIL,MOTRIN) 200 MG tablet Take 400 mg by mouth every 6 (six) hours as needed (pain).    Marland Kitchen lansoprazole (PREVACID) 30 MG capsule Take 30 mg by mouth daily at 12 noon.    Marland Kitchen losartan (COZAAR) 100 MG tablet TAKE 1 TABLET EVERY DAY 90 tablet 3  . metFORMIN (GLUCOPHAGE) 500 MG tablet Take 1 tablet (500 mg total) by mouth 2 (two) times daily with a meal. 180 tablet 3  . tiZANidine (ZANAFLEX) 4 MG tablet Take 1 tablet (4 mg total) by mouth every 6 (six) hours as needed for muscle spasms. 40 tablet 1   No current facility-administered medications for this visit.     Past Medical History  Diagnosis Date  . GERD (gastroesophageal reflux disease)   . Hiatal hernia   . Hemorrhoids   . Adenomatous polyp of colon 05/2004  . Diverticulosis     colon  . Hypertension   . Hyperlipidemia   . Diabetes type 2, controlled ([place])   . Anxiety   . CAD (coronary artery disease)     s/p Cypher DES to LAD and pD1  2010;  LHC was done 6/12: EF 65%, circumflex patent, mid RCA 80-90% (small and nondominant), LAD and diagonal stents patent, LAD at the origin of the first diagonal 30%, ostial D2 90%, mid 80-90% (small vessel).  Continued medical therapy was recommended.   . Right inguinal hernia     s/p repair in 8/12  . Esophageal stricture   . ED (erectile dysfunction)   . Allergic rhinitis   . IBS (irritable bowel syndrome)   . BPH (benign prostatic hyperplasia)   . Carotid stenosis     dopplers 02/2012: [id number] RICA; [id number] LICA  . SVT (supraventricular tachycardia) ([facility])     ROS:   All systems reviewed and negative except as noted in the HPI.   Past Surgical History  Procedure Laterality Date  . Coronary stent placement  10/2008    x 2  . Hernia repair      RIH with ultrapro patch  . Electrophysiologic study N/A 03/20/2015    no inducible SVT - Dr Lovena Le     Family History  Problem Relation Age of Onset  . Cancer Mother 67    unknown cancer  . Cancer Sister 72    unknown cancer     Social History  Social History  . Marital Status: Married    Spouse Name: N/A  . Number of Children: 3  . Years of Education: N/A   Occupational History  . Recruitment consultant for [employer] History Main Topics  . Smoking status: Former Research scientist (life sciences)  . Smokeless tobacco: Not on file  . Alcohol Use: No  . Drug Use: No  . Sexual Activity: Not on file   Other Topics Concern  . Not on file   Social History Narrative     BP 174/86 mmHg  Pulse 55  Ht 5\' 11"  (1.803 m)  Wt 167 lb 12.8 oz (76.114 kg)  BMI 23.41 kg/m2  Physical Exam:  Well appearing 82 year old man, NAD HEENT: Unremarkable Neck:  7 cm JVD, no thyromegally Back:  No CVA tenderness Lungs:  Clear with no wheezes, rales, or rhonchi HEART:  Regular rate rhythm, no murmurs, no rubs, no clicks Abd:  soft, positive bowel sounds, no organomegally, no rebound, no guarding Ext:  2 plus pulses, no edema, no cyanosis, no  clubbing Skin:  No rashes no nodules Neuro:  CN II through XII intact, motor grossly intact  EKG - sinus bradycardia  Assess/Plan:

## 2015-11-28 NOTE — Patient Instructions (Signed)
Medication Instructions:  Your physician recommends that you continue on your current medications as directed. Please refer to the Current Medication list given to you today.   Labwork: None ordered   Testing/Procedures: Your physician has recommended that you wear a holter monitor. Holter monitors are medical devices that record the heart's electrical activity. Doctors most often use these monitors to diagnose arrhythmias. Arrhythmias are problems with the speed or rhythm of the heartbeat. The monitor is a small, portable device. You can wear one while you do your normal daily activities. This is usually used to diagnose what is causing palpitations/syncope (passing out).    Follow-Up: Your physician recommends that you schedule a follow-up appointment in: 6 weeks with Dr Lovena Le   Any Other Special Instructions Will Be Listed Below (If Applicable).  Avoid caffeine and chocolate   Check your blood pressure daily and write down.  Bring list with you to follow up      If you need a refill on your cardiac medications before your next appointment, please call your pharmacy.

## 2015-11-28 NOTE — Assessment & Plan Note (Signed)
He has non-exertional, non-cardiac chest pain. Will follow.

## 2015-11-28 NOTE — Assessment & Plan Note (Signed)
His blood pressure is high. We are limited by his bradycardia regarding treatment options. He does admit to too much caffeine and chocolate. I have asked him to reduce his consumption of both and to reduce his salt intake.

## 2015-11-30 ENCOUNTER — Ambulatory Visit (INDEPENDENT_AMBULATORY_CARE_PROVIDER_SITE_OTHER): Payer: Commercial Managed Care - HMO

## 2015-11-30 DIAGNOSIS — R002 Palpitations: Secondary | ICD-10-CM

## 2015-12-05 ENCOUNTER — Telehealth: Payer: Self-pay | Admitting: *Deleted

## 2015-12-05 MED ORDER — CARVEDILOL 6.25 MG PO TABS: 6.2500 mg | ORAL_TABLET | Freq: Two times a day (BID) | ORAL | Status: DC

## 2015-12-05 NOTE — Telephone Encounter (Signed)
Pharmacy left msg on triage stating pt stated at his last visit MD wanted him to increase his coreg to 6.25 twice a day. Will need updated script. Per ov note 11/25/15 md did inform pt to increase coreg. Will send updated script...[name]

## 2015-12-15 IMAGING — CR DG CHEST 2V
2 series · 2 of 2 positions shown · non-contrast
Comparison: 01/30/2015

CLINICAL DATA: Midsternal chest pain with left arm radiation.

EXAM:
CHEST  2 VIEW

[chest pa]
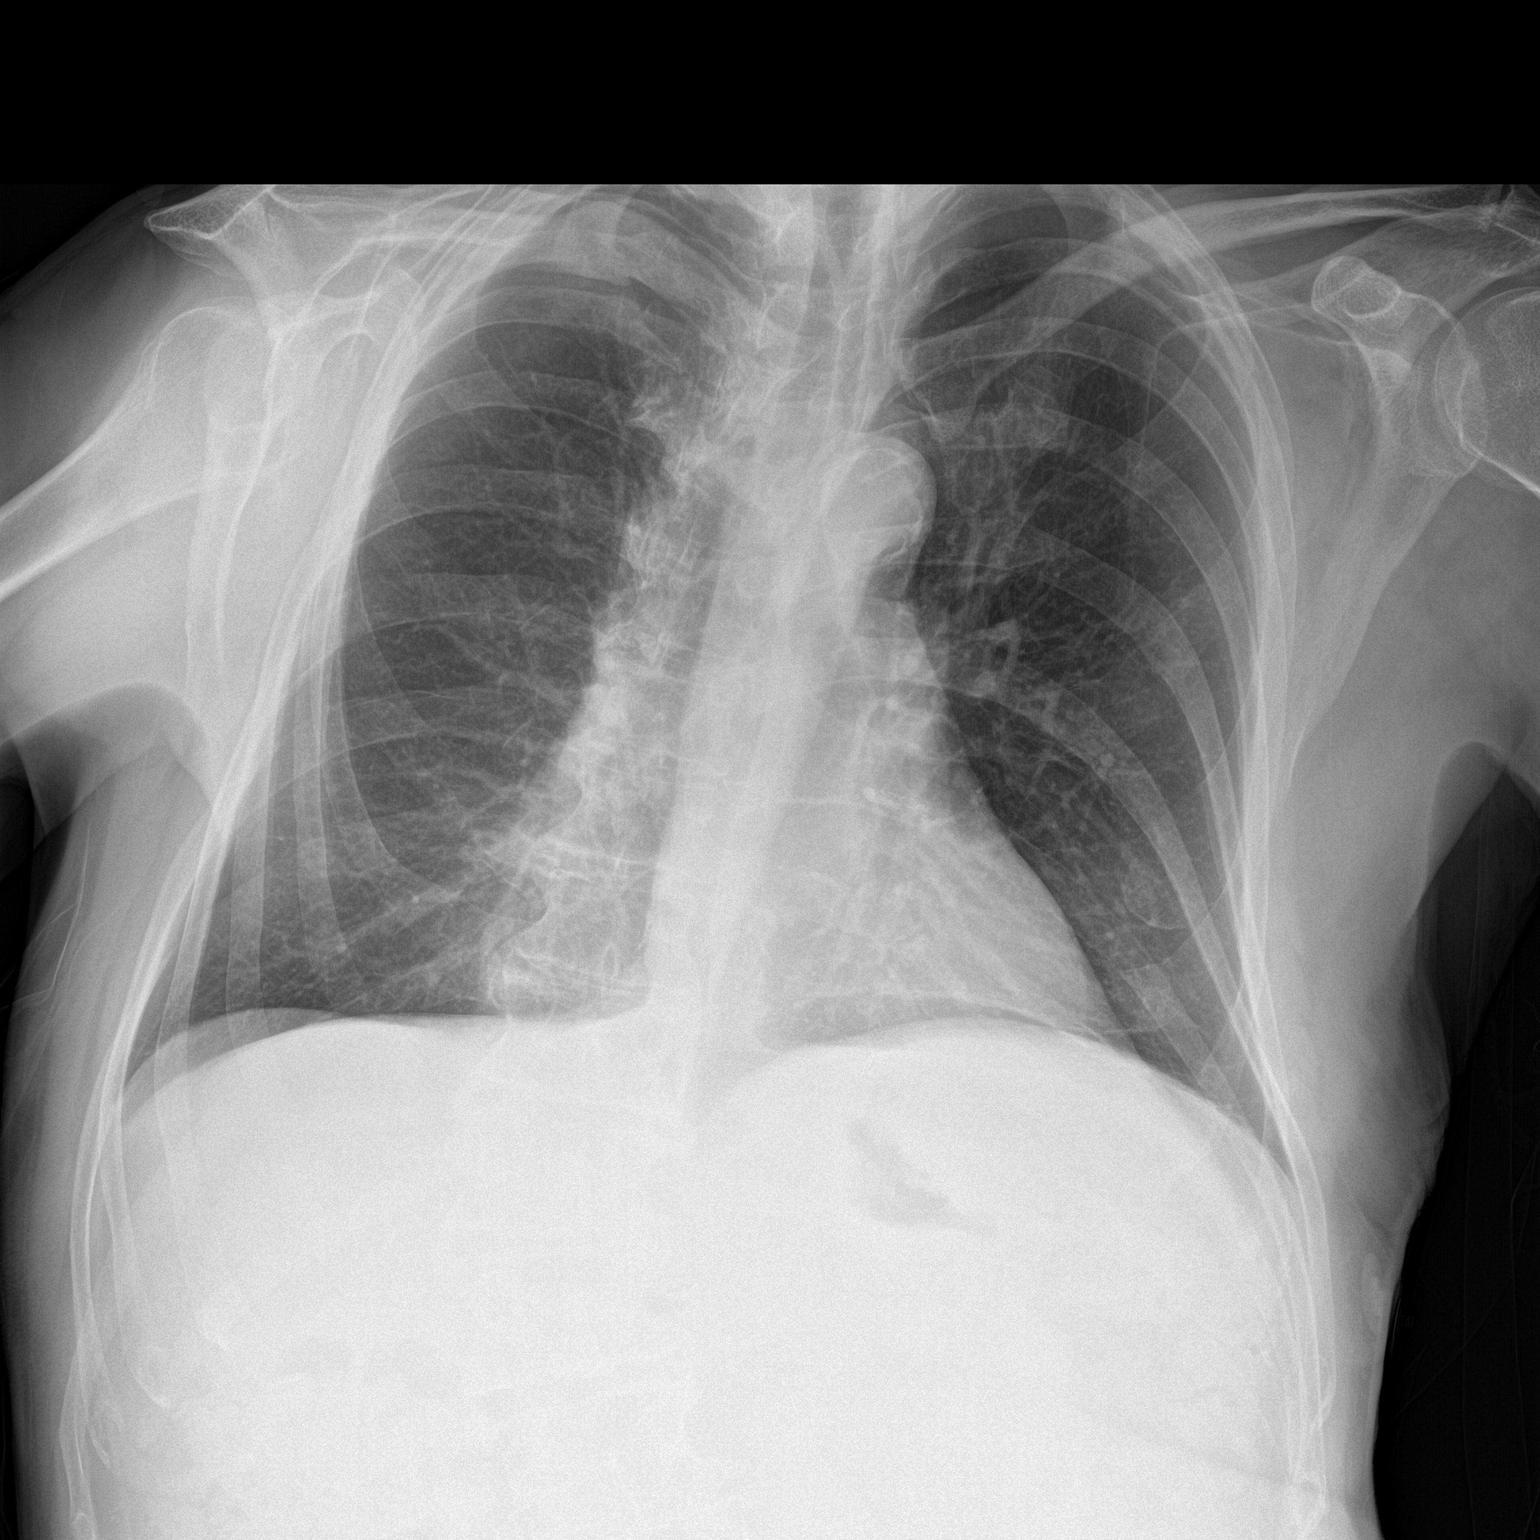

[chest lat]
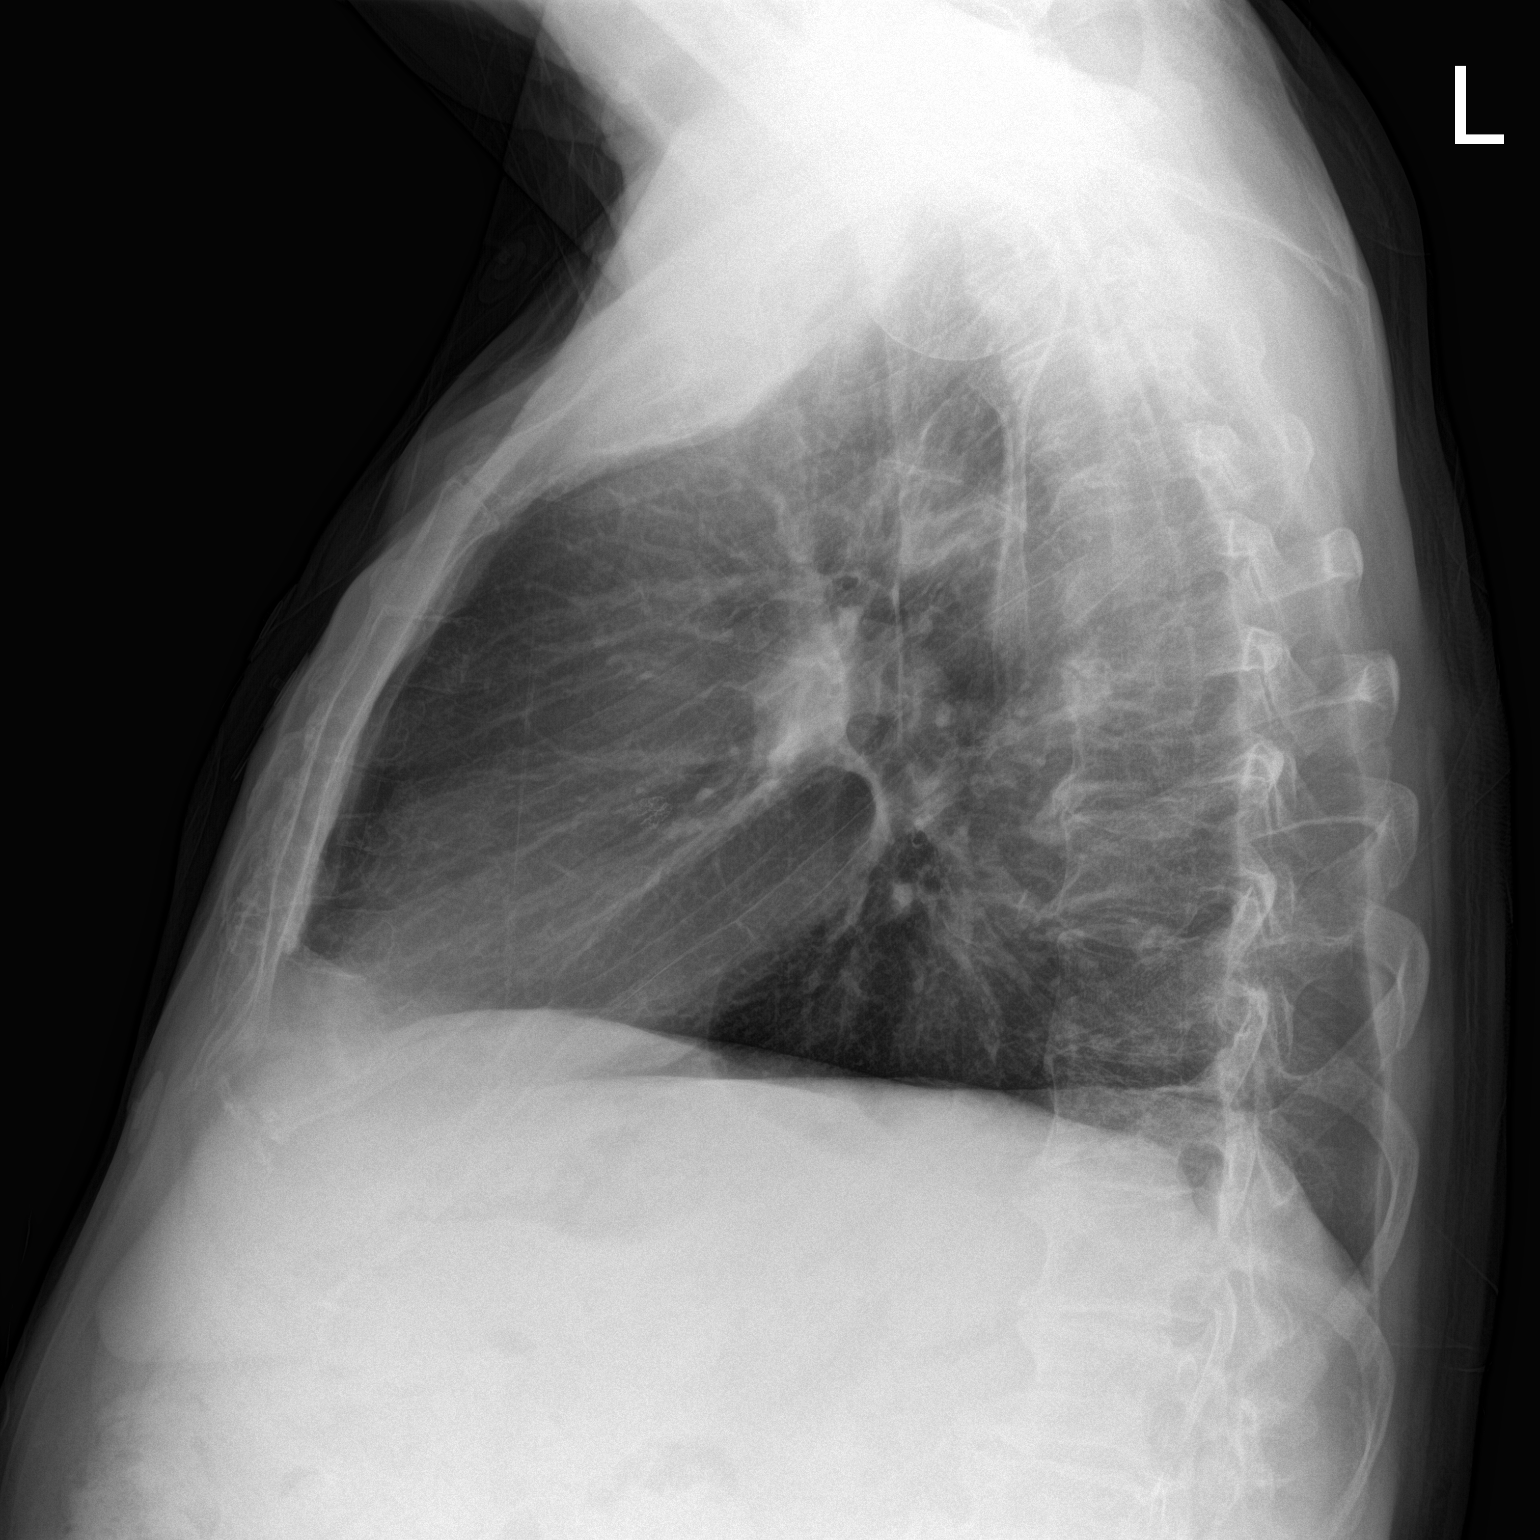

[2 of 2 positions shown; findings below may reference images not displayed]

FINDINGS: Patient is mildly rotated to the left. Thoracic aortic calcification
is noted. Cardiac silhouette is within normal limits for size. The
patient has taken a greater inspiration than on the prior study. No
airspace consolidation, edema, pleural effusion, or pneumothorax is
seen. Old right clavicle fracture is again noted. There is moderate
thoracolumbar dextroscoliosis.
IMPRESSION: No active cardiopulmonary disease.

## 2015-12-21 ENCOUNTER — Other Ambulatory Visit (INDEPENDENT_AMBULATORY_CARE_PROVIDER_SITE_OTHER): Payer: Commercial Managed Care - HMO

## 2015-12-21 ENCOUNTER — Ambulatory Visit (INDEPENDENT_AMBULATORY_CARE_PROVIDER_SITE_OTHER): Payer: Commercial Managed Care - HMO | Admitting: Internal Medicine

## 2015-12-21 ENCOUNTER — Encounter: Payer: Self-pay | Admitting: Internal Medicine

## 2015-12-21 VITALS — BP 140/82 | HR 61 | Temp 98.6°F | Resp 20 | Ht 71.0 in | Wt 169.0 lb

## 2015-12-21 DIAGNOSIS — E785 Hyperlipidemia, unspecified: Secondary | ICD-10-CM | POA: Diagnosis not present

## 2015-12-21 DIAGNOSIS — E119 Type 2 diabetes mellitus without complications: Secondary | ICD-10-CM

## 2015-12-21 DIAGNOSIS — L299 Pruritus, unspecified: Secondary | ICD-10-CM | POA: Diagnosis not present

## 2015-12-21 DIAGNOSIS — Z0000 Encounter for general adult medical examination without abnormal findings: Secondary | ICD-10-CM

## 2015-12-21 DIAGNOSIS — I10 Essential (primary) hypertension: Secondary | ICD-10-CM | POA: Diagnosis not present

## 2015-12-21 DIAGNOSIS — Z0189 Encounter for other specified special examinations: Secondary | ICD-10-CM | POA: Diagnosis not present

## 2015-12-21 DIAGNOSIS — M545 Low back pain, unspecified: Secondary | ICD-10-CM

## 2015-12-21 LAB — BASIC METABOLIC PANEL
BUN: 20 mg/dL (ref 6–23)
CHLORIDE: 106 meq/L (ref 96–112)
CO2: 29 mEq/L (ref 19–32)
CREATININE: 0.98 mg/dL (ref 0.40–1.50)
Calcium: 9.3 mg/dL (ref 8.4–10.5)
GFR: 77.87 mL/min (ref >60.00)
Glucose, Bld: 145 mg/dL — ABNORMAL HIGH (ref 70–99)
Potassium: 4.4 mEq/L (ref 3.5–5.1)
Sodium: 143 mEq/L (ref 135–145)

## 2015-12-21 LAB — URINALYSIS, ROUTINE W REFLEX MICROSCOPIC
Bilirubin Urine: NEGATIVE
HGB URINE DIPSTICK: NEGATIVE
KETONES UR: NEGATIVE
Leukocytes, UA: NEGATIVE
NITRITE: NEGATIVE
RBC / HPF: NONE SEEN (ref 0–?)
Specific Gravity, Urine: 1.02 (ref 1.000–1.030)
Total Protein, Urine: NEGATIVE
URINE GLUCOSE: NEGATIVE
Urobilinogen, UA: 0.2 (ref 0.0–1.0)
WBC UA: NONE SEEN (ref 0–?)
pH: 6 (ref 5.0–8.0)

## 2015-12-21 LAB — CBC WITH DIFFERENTIAL/PLATELET
BASOS PCT: 0.3 % (ref 0.0–3.0)
Basophils Absolute: 0 10*3/uL (ref 0.0–0.1)
EOS ABS: 0.2 10*3/uL (ref 0.0–0.7)
Eosinophils Relative: 2.7 % (ref 0.0–5.0)
HEMATOCRIT: 46.3 % (ref 39.0–52.0)
HEMOGLOBIN: 15.6 g/dL (ref 13.0–17.0)
LYMPHS PCT: 22.1 % (ref 12.0–46.0)
Lymphs Abs: 1.5 10*3/uL (ref 0.7–4.0)
MCHC: 33.7 g/dL (ref 30.0–36.0)
MCV: 87.4 fl (ref 78.0–100.0)
Monocytes Absolute: 0.5 10*3/uL (ref 0.1–1.0)
Monocytes Relative: 8 % (ref 3.0–12.0)
Neutro Abs: 4.4 10*3/uL (ref 1.4–7.7)
Neutrophils Relative %: 66.9 % (ref 43.0–77.0)
Platelets: 182 10*3/uL (ref 150.0–400.0)
RBC: 5.29 Mil/uL (ref 4.22–5.81)
RDW: 14.3 % (ref 11.5–15.5)
WBC: 6.6 10*3/uL (ref 4.0–10.5)

## 2015-12-21 LAB — MICROALBUMIN / CREATININE URINE RATIO
CREATININE, U: 117.2 mg/dL
MICROALB UR: 4.6 mg/dL — AB (ref 0.0–1.9)
Microalb Creat Ratio: 3.9 mg/g (ref 0.0–30.0)

## 2015-12-21 LAB — HEPATIC FUNCTION PANEL
ALT: 20 U/L (ref 0–53)
AST: 17 U/L (ref 0–37)
Albumin: 4 g/dL (ref 3.5–5.2)
Alkaline Phosphatase: 42 U/L (ref 39–117)
BILIRUBIN TOTAL: 1.2 mg/dL (ref 0.2–1.2)
Bilirubin, Direct: 0.2 mg/dL (ref 0.0–0.3)
Total Protein: 6.7 g/dL (ref 6.0–8.3)

## 2015-12-21 LAB — LIPID PANEL
CHOL/HDL RATIO: 3
Cholesterol: 117 mg/dL (ref 0–200)
HDL: 33.6 mg/dL — AB (ref >39.00)
LDL CALC: 63 mg/dL (ref 0–99)
NonHDL: 82.92
TRIGLYCERIDES: 102 mg/dL (ref 0.0–149.0)
VLDL: 20.4 mg/dL (ref 0.0–40.0)

## 2015-12-21 LAB — HEMOGLOBIN A1C: Hgb A1c MFr Bld: 6.8 % — ABNORMAL HIGH (ref 4.6–6.5)

## 2015-12-21 LAB — TSH: TSH: 1.78 u[IU]/mL (ref 0.35–4.50)

## 2015-12-21 MED ORDER — METHYLPREDNISOLONE ACETATE 80 MG/ML IJ SUSP
80.0000 mg | Freq: Once | INTRAMUSCULAR | Status: AC
  Administered 2015-12-21: 80 mg via INTRAMUSCULAR

## 2015-12-21 NOTE — Progress Notes (Signed)
Subjective:    Patient ID: Keith Herrera, male    DOB: 1932-08-21, 82 y.o.   MRN: [id number]  HPI  Here for wellness and f/u;  Overall doing ok;  Pt denies Chest pain, worsening SOB, DOE, wheezing, orthopnea, PND, worsening LE edema, palpitations, dizziness or syncope.  Pt denies neurological change such as new headache, facial or extremity weakness.  Pt denies polydipsia, polyuria, or low sugar symptoms. Pt states overall good compliance with treatment and medications, good tolerability, and has been trying to follow appropriate diet.  Pt denies worsening depressive symptoms, suicidal ideation or panic. No fever, night sweats, wt loss, loss of appetite, or other constitutional symptoms.  Pt states good ability with ADL's, has low fall risk, home safety reviewed and adequate, no other significant changes in hearing or vision, and only occasionally active with exercise.  Pt continues to have recurring left LBP without change in severity, bowel or bladder change, fever, wt loss,  worsening LE pain/numbness/weakness, gait change or falls, only really hurts to cough, or twisting to get into the car, better to lie down.  Also c/o left elbow pain achy mild, throbbing at lateral epicondylar and just distal, better after starts to use the arm later in the day.  Finished cardiac monitor x 48hrs.. Has f/u appt feb 24/dr taylor/card..  May be overdue for carotid f/u, pt to ask with next visit   Has recurrent itching to the torso, not always better with moisturizer, has had several depomedrol in the past which has helped, though no rash Past Medical History  Diagnosis Date  . GERD (gastroesophageal reflux disease)   . Hiatal hernia   . Hemorrhoids   . Adenomatous polyp of colon 05/2004  . Diverticulosis     colon  . Hypertension   . Hyperlipidemia   . Diabetes type 2, controlled ([place])   . Anxiety   . CAD (coronary artery disease)     s/p Cypher DES to LAD and pD1 2010;  LHC was done 6/12: EF 65%,  circumflex patent, mid RCA 80-90% (small and nondominant), LAD and diagonal stents patent, LAD at the origin of the first diagonal 30%, ostial D2 90%, mid 80-90% (small vessel).  Continued medical therapy was recommended.   . Right inguinal hernia     s/p repair in 8/12  . Esophageal stricture   . ED (erectile dysfunction)   . Allergic rhinitis   . IBS (irritable bowel syndrome)   . BPH (benign prostatic hyperplasia)   . Carotid stenosis     dopplers 02/2012: [id number] RICA; [id number] LICA  . SVT (supraventricular tachycardia) [facility])    Past Surgical History  Procedure Laterality Date  . Coronary stent placement  10/2008    x 2  . Hernia repair      RIH with ultrapro patch  . Electrophysiologic study N/A 03/20/2015    no inducible SVT - Dr Lovena Le    reports that he has quit smoking. He does not have any smokeless tobacco history on file. He reports that he does not drink alcohol or use illicit drugs. family history includes Cancer (age of onset: 24) in his mother; Cancer (age of onset: 39) in his sister. Allergies  Allergen Reactions  . Crestor [Rosuvastatin Calcium] Rash    All over body  . Lovastatin Itching and Rash   Current Outpatient Prescriptions on File Prior to Visit  Medication Sig Dispense Refill  . amLODipine (NORVASC) 5 MG tablet Take 5 mg by mouth daily.     Marland Kitchen  aspirin EC 81 MG tablet Take 81 mg by mouth daily after supper.    Marland Kitchen atorvastatin (LIPITOR) 10 MG tablet Take 10 mg by mouth daily.  2  . carvedilol (COREG) 6.25 MG tablet Take 1 tablet (6.25 mg total) by mouth 2 (two) times daily with a meal. 180 tablet 1  . glucose blood test strip Use as instructed 100 each 12  . ibuprofen (ADVIL,MOTRIN) 200 MG tablet Take 400 mg by mouth every 6 (six) hours as needed (pain).    Marland Kitchen lansoprazole (PREVACID) 30 MG capsule Take 30 mg by mouth daily at 12 noon.    Marland Kitchen losartan (COZAAR) 100 MG tablet TAKE 1 TABLET EVERY DAY 90 tablet 3  . metFORMIN (GLUCOPHAGE) 500 MG tablet Take 1 tablet  (500 mg total) by mouth 2 (two) times daily with a meal. 180 tablet 3  . tiZANidine (ZANAFLEX) 4 MG tablet Take 1 tablet (4 mg total) by mouth every 6 (six) hours as needed for muscle spasms. 40 tablet 1   No current facility-administered medications on file prior to visit.    Review of Systems Constitutional: Negative for increased diaphoresis, other activity, appetite or siginficant weight change other than noted HENT: Negative for worsening hearing loss, ear pain, facial swelling, mouth sores and neck stiffness.   Eyes: Negative for other worsening pain, redness or visual disturbance.  Respiratory: Negative for shortness of breath and wheezing  Cardiovascular: Negative for chest pain and palpitations.  Gastrointestinal: Negative for diarrhea, blood in stool, abdominal distention or other pain Genitourinary: Negative for hematuria, flank pain or change in urine volume.  Musculoskeletal: Negative for myalgias or other joint complaints.  Skin: Negative for color change and wound or drainage.  Neurological: Negative for syncope and numbness. other than noted Hematological: Negative for adenopathy. or other swelling Psychiatric/Behavioral: Negative for hallucinations, SI, self-injury, decreased concentration or other worsening agitation.      Objective:   Physical Exam BP 140/82 mmHg  Pulse 61  Temp(Src) 98.6 F (37 C) (Oral)  Resp 20  Ht 5\' 11"  (1.803 m)  Wt 169 lb (76.658 kg)  BMI 23.58 kg/m2  SpO2 94% VS noted,  Constitutional: Pt is oriented to person, place, and time. Appears well-developed and well-nourished, in no significant distress Head: Normocephalic and atraumatic.  Right Ear: External ear normal.  Left Ear: External ear normal.  Nose: Nose normal.  Mouth/Throat: Oropharynx is clear and moist.  Eyes: Conjunctivae and EOM are normal. Pupils are equal, round, and reactive to light.  Neck: Normal range of motion. Neck supple. No JVD present. No tracheal deviation  present or significant neck LA or mass Cardiovascular: Normal rate, regular rhythm, normal heart sounds and intact distal pulses.   Pulmonary/Chest: Effort normal and breath sounds without rales or wheezing  Abdominal: Soft. Bowel sounds are normal. NT. No HSM  Musculoskeletal: Normal range of motion. Exhibits no edema.  Lymphadenopathy:  Has no cervical adenopathy.  Spine: nontender, no significant paravertebral tender Neurological: Pt is alert and oriented to person, place, and time. Pt has normal reflexes. No cranial nerve deficit. Motor grossly intact Skin: Skin is warm and dry. No rash noted.  Psychiatric:  Has normal mood and affect. Behavior is normal.   ECG reviewed as per emr     Assessment & Plan:

## 2015-12-21 NOTE — Progress Notes (Signed)
Pre visit review using our clinic review tool, if applicable. No additional management support is needed unless otherwise documented below in the visit note. 

## 2015-12-21 NOTE — Patient Instructions (Signed)
You had the steroid shot today  OK to also take OTC Zyrtec for the itching as needed, or even OTC Benadryl cream  Please continue all other medications as before, and refills have been done if requested.  Please have the pharmacy call with any other refills you may need.  Please continue your efforts at being more active, low cholesterol diet, and weight control.  You are otherwise up to date with prevention measures today.  Please keep your appointments with your specialists as you may have planned  You will be contacted regarding the referral for: Dr Tamala Julian - sports medicine (in this office)  Please go to the LAB in the Basement (turn left off the elevator) for the tests to be done today  You will be contacted by phone if any changes need to be made immediately.  Otherwise, you will receive a letter about your results with an explanation, but please check with MyChart first.  Please remember to sign up for MyChart if you have not done so, as this will be important to you in the future with finding out test results, communicating by private email, and scheduling acute appointments online when needed.  Please return in 6 months, or sooner if needed, with Lab testing done 3-5 days before

## 2015-12-24 NOTE — Assessment & Plan Note (Signed)
stable overall by history and exam, recent data reviewed with pt, and pt to continue medical treatment as before,  to f/u any worsening symptoms or concerns Lab Results  Component Value Date   HGBA1C 6.8* 12/21/2015

## 2015-12-24 NOTE — Assessment & Plan Note (Signed)
stable overall by history and exam, recent data reviewed with pt, and pt to continue medical treatment as before,  to f/u any worsening symptoms or concerns Lab Results  Component Value Date   LDLCALC 63 12/21/2015

## 2015-12-24 NOTE — Assessment & Plan Note (Signed)
Mild without rash, for depomedrol IM,  to f/u any worsening symptoms or concerns

## 2015-12-24 NOTE — Assessment & Plan Note (Signed)

## 2015-12-24 NOTE — Assessment & Plan Note (Signed)
Etiology unclear, possibly lumbar djd or ddd flare, declines other tx for now, for refer to sport med per pt request

## 2015-12-24 NOTE — Assessment & Plan Note (Signed)
stable overall by history and exam, recent data reviewed with pt, and pt to continue medical treatment as before,  to f/u any worsening symptoms or concerns BP Readings from Last 3 Encounters:  12/21/15 140/82  11/28/15 174/86  11/24/15 158/70

## 2015-12-27 ENCOUNTER — Ambulatory Visit (INDEPENDENT_AMBULATORY_CARE_PROVIDER_SITE_OTHER): Payer: Commercial Managed Care - HMO

## 2015-12-27 VITALS — BP 150/80 | Ht 70.0 in | Wt 164.2 lb

## 2015-12-27 DIAGNOSIS — Z0000 Encounter for general adult medical examination without abnormal findings: Secondary | ICD-10-CM

## 2015-12-27 NOTE — Patient Instructions (Addendum)
Keith Herrera , Thank you for taking time to come for your Medicare Wellness Visit. I appreciate your ongoing commitment to your health goals. Please review the following plan we discussed and let me know if I can assist you in the future.   Will get eyes re-checked and glasses   Educated to check with insurance regarding coverage of Shingles vaccination on Part D or Part B and may have lower co-pay if provided on the Part D side  Important Safety Information ZOSTAVAX does not protect everyone, so some people who get the vaccine may still get Shingles.  You should not get ZOSTAVAX if you are allergic to any of its ingredients, including gelatin or neomycin, have a weakened immune system, take high doses of steroids, or are pregnant or plan to become pregnant. You should not get ZOSTAVAX to prevent chickenpox.  Talk to your health care professional if you plan to get ZOSTAVAX at the same time as [id number] (Pneumococcal Vaccine Polyvalent) because it may be better to get these vaccines at least 4 weeks apart.  Possible side effects include redness, pain, itching, swelling, hard lump, warmth, or bruising at the injection site, as well as headache.  ZOSTAVAX contains a weakened chickenpox virus. Tell your health care professional if you will be in close contact with newborn infants, someone who may be pregnant and has not had chickenpox or been vaccinated against chickenpox, or someone who has problems with their immune system. Your health care professional can tell you what situations you may need to avoid. You are encouraged to report negative side effects of prescription drugs to the FDA. Visit [employer], or call 1-800-FDA-1088.    These are the goals we discussed: Goals    . patient     Maintain health and stay active; continue to work in yard        This is a list of the screening recommended for you and due dates:  Health Maintenance  Topic Date Due  . Eye exam for  diabetics  03/15/1942  . Shingles Vaccine  03/15/1994  . Complete foot exam   09/21/2014  . Flu Shot  06/18/2016  . Hemoglobin A1C  06/19/2016  . Tetanus Vaccine  10/28/2018  . Pneumonia vaccines  Completed     Screening for Type 2 Diabetes Screening is a way to check for type 2 diabetes in people who do not have symptoms of the disease, but who may likely develop diabetes in the future. Diabetes can lead to serious health problems, but finding diabetes early allows for early treatment. DIABETES RISK FACTORS   Family history of diabetes.  Diseases of the pancreas.  Obesity or being overweight.  Certain racial or ethnic groups:  American [place].  Pacific Islander.  Hispanic.  Asian.  African American.  High blood pressure (hypertension).  History of diabetes while pregnant (gestational diabetes).  Delivering a baby that weighed over 9 pounds.  Being inactive.  High cholesterol or triglycerides.  Age, especially over 50 years of age.  Other diseases or conditions.  Diseases of the pancreas.  Cardiovascular disease.  Disorders of the endocrine system.  Certain medicines, such as those that treat high blood cholesterol levels. WHO IS SCREENED Adults  Adults who have no risk factors and no symptoms should be screened starting at age 37. If the screening tests are normal, they should be repeated every 3 years.  Adults who do not have symptoms, but have 1 or more risk factors, should be screened.  Adults who  have 2 or more risk factors may be screened every year.  Adults who have an A1c (3 month average of blood glucose) greater than 5.7% or who had an impaired glucose tolerance (IGT) or impaired fasting glucose (IFG) on a previous test should be screened.  Pregnant women who have risk factors should be screened at their first prenatal visit.  Women who have given birth and had gestational diabetes should be screened 6-12 weeks after the child is born. This  screening should be repeated every 1-3 years after the first test. Children or Adolescents  Children and adolescents should be screened for type 2 diabetes if they are overweight and have 2 of the following risk factors:  Having a family history of type 2 diabetes.  Being a member of a high risk race or ethnic group.  Having signs of insulin resistance or conditions associated with insulin resistance.  Having a mother who had gestational diabetes while pregnant with him or her.  Screening should start at age 24 or at the onset of puberty, whichever comes first. This should be repeated every 2 years. SCREENING In a screening, your caregiver may:  Ask questions about your overall health. This will include questions about the health of close family members, too.  Ask about any diabetes-like symptoms you may have.  Perform a physical exam.  Order some tests that may include:  A fasting plasma glucose test. This measures the level of glucose in your blood. It is done after you have had nothing to eat but water (fasted) for 8 hours.  A random blood glucose test. This test is done without the need to fast.  An oral glucose tolerance test. This is a blood test done in 2 parts. First, a blood sample is taken after you have fasted. Then, another sample is taken after you drink a liquid that contains a lot of sugar.  An A1c test. This test shows how much glucose has been in your blood over the past 2 to 3 months.   This information is not intended to replace advice given to you by your health care provider. Make sure you discuss any questions you have with your health care provider.   Document Released: 08/31/2009 Document Revised: 11/25/2014 Document Reviewed: 06/12/2011 Elsevier Interactive Patient Education 2016 [place] in the Home  Falls can cause injuries. They can happen to people of all ages. There are many things you can do to make your home safe and to  help prevent falls.  WHAT CAN I DO ON THE OUTSIDE OF MY HOME?  Regularly fix the edges of walkways and driveways and fix any cracks.  Remove anything that might make you trip as you walk through a door, such as a raised step or threshold.  Trim any bushes or trees on the path to your home.  Use bright outdoor lighting.  Clear any walking paths of anything that might make someone trip, such as rocks or tools.  Regularly check to see if handrails are loose or broken. Make sure that both sides of any steps have handrails.  Any raised decks and porches should have guardrails on the edges.  Have any leaves, snow, or ice cleared regularly.  Use sand or salt on walking paths during winter.  Clean up any spills in your garage right away. This includes oil or grease spills. WHAT CAN I DO IN THE BATHROOM?   Use night lights.  Install grab bars by the toilet and in  the tub and shower. Do not use towel bars as grab bars.  Use non-skid mats or decals in the tub or shower.  If you need to sit down in the shower, use a plastic, non-slip stool.  Keep the floor dry. Clean up any water that spills on the floor as soon as it happens.  Remove soap buildup in the tub or shower regularly.  Attach bath mats securely with double-sided non-slip rug tape.  Do not have throw rugs and other things on the floor that can make you trip. WHAT CAN I DO IN THE BEDROOM?  Use night lights.  Make sure that you have a light by your bed that is easy to reach.  Do not use any sheets or blankets that are too big for your bed. They should not hang down onto the floor.  Have a firm chair that has side arms. You can use this for support while you get dressed.  Do not have throw rugs and other things on the floor that can make you trip. WHAT CAN I DO IN THE KITCHEN?  Clean up any spills right away.  Avoid walking on wet floors.  Keep items that you use a lot in easy-to-reach places.  If you need to  reach something above you, use a strong step stool that has a grab bar.  Keep electrical cords out of the way.  Do not use floor polish or wax that makes floors slippery. If you must use wax, use non-skid floor wax.  Do not have throw rugs and other things on the floor that can make you trip. WHAT CAN I DO WITH MY STAIRS?  Do not leave any items on the stairs.  Make sure that there are handrails on both sides of the stairs and use them. Fix handrails that are broken or loose. Make sure that handrails are as long as the stairways.  Check any carpeting to make sure that it is firmly attached to the stairs. Fix any carpet that is loose or worn.  Avoid having throw rugs at the top or bottom of the stairs. If you do have throw rugs, attach them to the floor with carpet tape.  Make sure that you have a light switch at the top of the stairs and the bottom of the stairs. If you do not have them, ask someone to add them for you. WHAT ELSE CAN I DO TO HELP PREVENT FALLS?  Wear shoes that:  Do not have high heels.  Have rubber bottoms.  Are comfortable and fit you well.  Are closed at the toe. Do not wear sandals.  If you use a stepladder:  Make sure that it is fully opened. Do not climb a closed stepladder.  Make sure that both sides of the stepladder are locked into place.  Ask someone to hold it for you, if possible.  Clearly mark and make sure that you can see:  Any grab bars or handrails.  First and last steps.  Where the edge of each step is.  Use tools that help you move around (mobility aids) if they are needed. These include:  Canes.  Walkers.  Scooters.  Crutches.  Turn on the lights when you go into a dark area. Replace any light bulbs as soon as they burn out.  Set up your furniture so you have a clear path. Avoid moving your furniture around.  If any of your floors are uneven, fix them.  If there are any pets  around you, be aware of where they  are.  Review your medicines with your doctor. Some medicines can make you feel dizzy. This can increase your chance of falling. Ask your doctor what other things that you can do to help prevent falls.   This information is not intended to replace advice given to you by your health care provider. Make sure you discuss any questions you have with your health care provider.   Document Released: 08/31/2009 Document Revised: 03/21/2015 Document Reviewed: 12/09/2014 Elsevier Interactive Patient Education 2016 [place] Maintenance, Male A healthy lifestyle and preventative care can promote health and wellness.  Maintain regular health, dental, and eye exams.  Eat a healthy diet. Foods like vegetables, fruits, whole grains, low-fat dairy products, and lean protein foods contain the nutrients you need and are low in calories. Decrease your intake of foods high in solid fats, added sugars, and salt. Get information about a proper diet from your health care provider, if necessary.  Regular physical exercise is one of the most important things you can do for your health. Most adults should get at least 150 minutes of moderate-intensity exercise (any activity that increases your heart rate and causes you to sweat) each week. In addition, most adults need muscle-strengthening exercises on 2 or more days a week.   Maintain a healthy weight. The body mass index (BMI) is a screening tool to identify possible weight problems. It provides an estimate of body fat based on height and weight. Your health care provider can find your BMI and can help you achieve or maintain a healthy weight. For males 20 years and older:  A BMI below 18.5 is considered underweight.  A BMI of 18.5 to 24.9 is normal.  A BMI of 25 to 29.9 is considered overweight.  A BMI of 30 and above is considered obese.  Maintain normal blood lipids and cholesterol by exercising and minimizing your intake of saturated fat. Eat a  balanced diet with plenty of fruits and vegetables. Blood tests for lipids and cholesterol should begin at age 49 and be repeated every 5 years. If your lipid or cholesterol levels are high, you are over age 31, or you are at high risk for heart disease, you may need your cholesterol levels checked more frequently.Ongoing high lipid and cholesterol levels should be treated with medicines if diet and exercise are not working.  If you smoke, find out from your health care provider how to quit. If you do not use tobacco, do not start.  Lung cancer screening is recommended for adults aged 46-80 years who are at high risk for developing lung cancer because of a history of smoking. A yearly low-dose CT scan of the lungs is recommended for people who have at least a 30-pack-year history of smoking and are current smokers or have quit within the past 15 years. A pack year of smoking is smoking an average of 1 pack of cigarettes a day for 1 year (for example, a 30-pack-year history of smoking could mean smoking 1 pack a day for 30 years or 2 packs a day for 15 years). Yearly screening should continue until the smoker has stopped smoking for at least 15 years. Yearly screening should be stopped for people who develop a health problem that would prevent them from having lung cancer treatment.  If you choose to drink alcohol, do not have more than 2 drinks per day. One drink is considered to be 12 oz (360 mL) of  beer, 5 oz (150 mL) of wine, or 1.5 oz (45 mL) of liquor.  Avoid the use of street drugs. Do not share needles with anyone. Ask for help if you need support or instructions about stopping the use of drugs.  High blood pressure causes heart disease and increases the risk of stroke. High blood pressure is more likely to develop in:  People who have blood pressure in the end of the normal range (100-139/85-89 mm Hg).  People who are overweight or obese.  People who are African American.  If you are 46-11  years of age, have your blood pressure checked every 3-5 years. If you are 21 years of age or older, have your blood pressure checked every year. You should have your blood pressure measured twice--once when you are at a hospital or clinic, and once when you are not at a hospital or clinic. Record the average of the two measurements. To check your blood pressure when you are not at a hospital or clinic, you can use:  An automated blood pressure machine at a pharmacy.  A home blood pressure monitor.  If you are 36-75 years old, ask your health care provider if you should take aspirin to prevent heart disease.  Diabetes screening involves taking a blood sample to check your fasting blood sugar level. This should be done once every 3 years after age 54 if you are at a normal weight and without risk factors for diabetes. Testing should be considered at a younger age or be carried out more frequently if you are overweight and have at least 1 risk factor for diabetes.  Colorectal cancer can be detected and often prevented. Most routine colorectal cancer screening begins at the age of 29 and continues through age 53. However, your health care provider may recommend screening at an earlier age if you have risk factors for colon cancer. On a yearly basis, your health care provider may provide home test kits to check for hidden blood in the stool. A small camera at the end of a tube may be used to directly examine the colon (sigmoidoscopy or colonoscopy) to detect the earliest forms of colorectal cancer. Talk to your health care provider about this at age 41 when routine screening begins. A direct exam of the colon should be repeated every 5-10 years through age 75, unless early forms of precancerous polyps or small growths are found.  People who are at an increased risk for hepatitis B should be screened for this virus. You are considered at high risk for hepatitis B if:  You were born in a country where  hepatitis B occurs often. Talk with your health care provider about which countries are considered high risk.  Your parents were born in a high-risk country and you have not received a shot to protect against hepatitis B (hepatitis B vaccine).  You have HIV or AIDS.  You use needles to inject street drugs.  You live with, or have sex with, someone who has hepatitis B.  You are a man who has sex with other men (MSM).  You get hemodialysis treatment.  You take certain medicines for conditions like cancer, organ transplantation, and autoimmune conditions.  Hepatitis C blood testing is recommended for all people born from [birth year] through 1963 and any individual with known risk factors for hepatitis C.  Healthy men should no longer receive prostate-specific antigen (PSA) blood tests as part of routine cancer screening. Talk to your health care provider about prostate  cancer screening.  Testicular cancer screening is not recommended for adolescents or adult males who have no symptoms. Screening includes self-exam, a health care provider exam, and other screening tests. Consult with your health care provider about any symptoms you have or any concerns you have about testicular cancer.  Practice safe sex. Use condoms and avoid high-risk sexual practices to reduce the spread of sexually transmitted infections (STIs).  You should be screened for STIs, including gonorrhea and chlamydia if:  You are sexually active and are younger than 24 years.  You are older than 24 years, and your health care provider tells you that you are at risk for this type of infection.  Your sexual activity has changed since you were last screened, and you are at an increased risk for chlamydia or gonorrhea. Ask your health care provider if you are at risk.  If you are at risk of being infected with HIV, it is recommended that you take a prescription medicine daily to prevent HIV infection. This is called pre-exposure  prophylaxis (PrEP). You are considered at risk if:  You are a man who has sex with other men (MSM).  You are a heterosexual man who is sexually active with multiple partners.  You take drugs by injection.  You are sexually active with a partner who has HIV.  Talk with your health care provider about whether you are at high risk of being infected with HIV. If you choose to begin PrEP, you should first be tested for HIV. You should then be tested every 3 months for as long as you are taking PrEP.  Use sunscreen. Apply sunscreen liberally and repeatedly throughout the day. You should seek shade when your shadow is shorter than you. Protect yourself by wearing long sleeves, pants, a wide-brimmed hat, and sunglasses year round whenever you are outdoors.  Tell your health care provider of new moles or changes in moles, especially if there is a change in shape or color. Also, tell your health care provider if a mole is larger than the size of a pencil eraser.  A one-time screening for abdominal aortic aneurysm (AAA) and surgical repair of large AAAs by ultrasound is recommended for men aged 49-75 years who are current or former smokers.  Stay current with your vaccines (immunizations).   This information is not intended to replace advice given to you by your health care provider. Make sure you discuss any questions you have with your health care provider.   Document Released: 05/02/2008 Document Revised: 11/25/2014 Document Reviewed: 04/01/2011 Elsevier Interactive Patient Education [employer].

## 2015-12-27 NOTE — Progress Notes (Addendum)
Subjective:   Keith Herrera is a 82 y.o. male who presents for Medicare Annual/Subsequent preventive examination.  Review of Systems:  HRA assessment completed during visit;  The Patient was informed that this wellness visit is to identify risk and educate on how to reduce risk for increase disease through lifestyle changes.   ROS deferred to CPE exam with physician  Describes health has fair States 87 years; still drives a bus holiday tours  Son is a Company secretary Dtr died in [place] many years ago / grandson now 45    Medical issues SVT and denies any chest pain or other heart issues.  HTN:  (Microalbumin; 4.6) managed on amlodipine; carvedilol; losartan BP [id number] systolic this am but states he had pork chop sandwich this am. Will monitor BP at home 2 to 3 times a week;  Increased Coreg in Jan at [facility]; states he is getting along well with increase dose;  Educated regarding sodium rich foods and to avoid; sausage, bacon, shellfish; ketchup; canned foods; processed food; Educated again that BP control was critical to long term goals of maintaining health.  CAD/ followed by cardiology Hyperlipidemia (chol 131; Trig 154; HDL 33; LDL 63 Ratio 3) managed on lipitor DM A1c 6.8 down from 7.7 managed medical metformin and diet   BMI: 23  Diet; in am eat eggs; bacon; jelly and toast Lunch if hungry but eats supper; Worked 4 hours in the yard which helps him manage his BS;  Tries to check BS and eats accordingly;  BS running 116 and 117; cut  back on Metformin per Dr. Jenny Reichmann; depends on BS one in am; 110 to 120;  Exercise;  Works 1 to 2 days a week; [employer] some when driving bus to local areas of interest.  [employer]; keeps tractors running; has a garden in the summer.   SAFETY; multi level home  built in 1975;  Safety reviewed for the home; Lives on middle floor where the driveway comes in He runs and up and down stairs to the basement where had has wood stove No dizzy unless getting up after a nap.   Removal of clutter clearing paths through the home,  Railing as needed; but doesn't use it Bathroom safety; patient uses the upstairs bathroom with separate shower  Community safety; yes Smoke detectors; yes Firearms safety / keep in a safe place  Driving accidents and seatbelt/ no  Sun protection/ wears a cap   Stressors; does worry a lot  Depression; states no, he doesn't feel depressed   Medication review/ recently changed; Doctor told him to take Metformin daily and can cut back in the evening if BS is down.  Inc'd coreg; request he check BP at home.  Fall assessment / fall x 1 stepped in a hole outside; back still hurts some on left side; Seeing Dr. Tamala Julian next week   Gait assessment; get up and go normal   Mobilization and Functional losses in the last year/none except back issue which is improving  Sleep patterns; sleeps fair; takes a nap after lunch   Urinary or fecal incontinence reviewed/ goes to bathroom at hs  No medicine; discussed with dr. Jenny Reichmann    Counseling: Foot exam/ states he checks feet every night;  Feet checked today; circulation good; warm and dry; pulses + dorsalis and feet warm and dry without any noted areas of concern; Toe nails thick; checks feet every day and applies lotion; wears shoes Colonoscopy; 09/2008 EKG: 12/2015 Hearing: 4000 right ear  and 2000 in left ear/ DOT checked ears Ophthalmology exam; April of 2016/ going back to get eyes checked Dr. Lissa Hoard; [facility] Immunizations Due: zostavax;  Dr. Jenny Reichmann told him to check with pharmacy and given information on the shingles and the vaccine today.  Current Care Team reviewed and updated   Cardiac Risk Factors include: advanced age (>20men, >61 women);diabetes mellitus;dyslipidemia;family history of premature cardiovascular disease;male gender;microalbuminuria     Objective:    Vitals: BP 150/80 mmHg  Ht 5\' 10"  (1.778 m)  Wt 164 lb 4 oz (74.503 kg)  BMI 23.57  kg/m2  Tobacco History  Smoking status  . Former Smoker  Smokeless tobacco  . Not on file    Comment: smoked in teens     Counseling given: Yes   Past Medical History  Diagnosis Date  . GERD (gastroesophageal reflux disease)   . Hiatal hernia   . Hemorrhoids   . Adenomatous polyp of colon 05/2004  . Diverticulosis     colon  . Hypertension   . Hyperlipidemia   . Diabetes type 2, controlled ([place])   . Anxiety   . CAD (coronary artery disease)     s/p Cypher DES to LAD and pD1 2010;  LHC was done 6/12: EF 65%, circumflex patent, mid RCA 80-90% (small and nondominant), LAD and diagonal stents patent, LAD at the origin of the first diagonal 30%, ostial D2 90%, mid 80-90% (small vessel).  Continued medical therapy was recommended.   . Right inguinal hernia     s/p repair in 8/12  . Esophageal stricture   . ED (erectile dysfunction)   . Allergic rhinitis   . IBS (irritable bowel syndrome)   . BPH (benign prostatic hyperplasia)   . Carotid stenosis     dopplers 02/2012: [id number] RICA; [id number] LICA  . SVT (supraventricular tachycardia) [facility])    Past Surgical History  Procedure Laterality Date  . Coronary stent placement  10/2008    x 2  . Hernia repair      RIH with ultrapro patch  . Electrophysiologic study N/A 03/20/2015    no inducible SVT - Dr Lovena Le   Family History  Problem Relation Age of Onset  . Cancer Mother 64    unknown cancer  . Cancer Sister 59    unknown cancer   History  Sexual Activity  . Sexual Activity: Not on file    Outpatient Encounter Prescriptions as of 12/27/2015  Medication Sig  . amLODipine (NORVASC) 5 MG tablet Take 5 mg by mouth daily.   Marland Kitchen aspirin EC 81 MG tablet Take 81 mg by mouth daily after supper.  Marland Kitchen atorvastatin (LIPITOR) 10 MG tablet Take 10 mg by mouth daily.  . carvedilol (COREG) 6.25 MG tablet Take 1 tablet (6.25 mg total) by mouth 2 (two) times daily with a meal.  . glucose blood test strip Use as instructed  . ibuprofen  (ADVIL,MOTRIN) 200 MG tablet Take 400 mg by mouth every 6 (six) hours as needed (pain).  Marland Kitchen lansoprazole (PREVACID) 30 MG capsule Take 30 mg by mouth daily at 12 noon.  Marland Kitchen losartan (COZAAR) 100 MG tablet TAKE 1 TABLET EVERY DAY  . metFORMIN (GLUCOPHAGE) 500 MG tablet Take 1 tablet (500 mg total) by mouth 2 (two) times daily with a meal.  . tiZANidine (ZANAFLEX) 4 MG tablet Take 1 tablet (4 mg total) by mouth every 6 (six) hours as needed for muscle spasms. (Patient not taking: Reported on 12/27/2015)  No facility-administered encounter medications on file as of 12/27/2015.    Activities of Daily Living In your present state of health, do you have any difficulty performing the following activities: 12/27/2015 03/20/2015  Hearing? N -  Vision? N -  Difficulty concentrating or making decisions? N -  Walking or climbing stairs? N -  Dressing or bathing? N -  Doing errands, shopping? N N  Preparing Food and eating ? N -  Using the Toilet? N -  In the past six months, have you accidently leaked urine? (No Data) -  Managing your Medications? N -  Managing your Finances? N -  Housekeeping or managing your Housekeeping? N -    Patient Care Team: [name], MD as PCP - General   Assessment:    Assessment   Patient presents for yearly preventative medicine examination. Medicare questionnaire screening were completed, i.e. Functional; fall risk; depression, memory loss and hearing were unremarkable   All immunizations and health maintenance protocols were reviewed with the patient is up to date x zostavax and did review and educate  Education provided for laboratory screens;  A1c improved;  Educated regarding low sodium diet; to monitor BP at home  Medication reconciliation, past medical history, social history, problem list and allergies were reviewed in detail with the patient  Goals were established with regard to maintaining health. Will encourage to maintain BP below 140/ 80; States DOT  checks his BP and he cannot drive a bus if it remains elevated.   End of life planning was discussed. States he has spoken to an attorney and needs to get this done. Agreed to follow up. Given information from [place] and [place] and LW for educational purposes prior to discussing his other needs with his attorney. Can call for questions   Exercise Activities and Dietary recommendations Current Exercise Habits:: Home exercise routine;The patient has a physically strenous job, but has no regular exercise apart from work., Time (Minutes): 60, Frequency (Times/Week): 3, Weekly Exercise (Minutes/Week): 180, Intensity: Moderate  Goals    . patient     Maintain health and stay active; continue to work in yard       [place]  12/27/2015 12/21/2015 12/21/2015 12/13/2014 09/21/2013  Falls in the past year? Yes No No No No  Number falls in past yr: 1 - - - -  Follow up Education provided - - - -   Depression Screen PHQ 2/9 Scores 12/27/2015 12/21/2015 12/21/2015 09/21/2013  PHQ - 2 Score 0 0 0 0    Cognitive Testing No flowsheet data found.   Ad8 score 0; Spouse here today and states his memory is good   Immunization History  Administered Date(s) Administered  . H1N1 10/28/2008  . Influenza Whole 11/30/2007, 11/18/2009  . Influenza, High Dose Seasonal PF 09/21/2013, 10/04/2014  . Pneumococcal Conjugate-13 10/05/2013  . Pneumococcal Polysaccharide-23 11/30/2007  . Td 03/23/1998, 10/28/2008   Screening Tests Health Maintenance  Topic Date Due  . OPHTHALMOLOGY EXAM  03/15/1942  . ZOSTAVAX  03/15/1994  . FOOT EXAM  09/21/2014  . INFLUENZA VACCINE  06/18/2016  . HEMOGLOBIN A1C  06/19/2016  . TETANUS/TDAP  10/28/2018  . PNA vac Low Risk Adult  Completed      Plan:     Will have glasses checked as he feels his new glasses are not right;  During the course of the visit the patient was educated and counseled about the following appropriate screening and preventive services:   Vaccines to  include Pneumoccal, Influenza, Hepatitis B, Td, Zostavax, HCV/ discussed and educated on Zostavax  Electrocardiogram - 12/2015  Cardiovascular Disease/ BP elevated; Educated as noted   Colorectal cancer screening/ 09/2008; aged out   Diabetes screening/ Improved at 6.8  Prostate Cancer Screening/ deferred   Glaucoma screening/ eye exam in April 2016 and to have eyes checked for vision purposes soon.  Nutrition counseling / reviewed low sodium  Smoking cessation counseling/ states he smoked in his teens  Patient Instructions (the written plan) was given to the patient.    [id number], RN  12/27/2015   Medical screening examination/treatment/procedure(s) were performed by non-physician practitioner and as supervising physician I was immediately available for consultation/collaboration. I agree with above. Cathlean Cower, MD

## 2015-12-29 ENCOUNTER — Telehealth: Payer: Self-pay

## 2015-12-29 MED ORDER — METFORMIN HCL 500 MG PO TABS: ORAL_TABLET | ORAL | Status: DC

## 2015-12-29 NOTE — Telephone Encounter (Signed)
[place] for three per day metformin as prescribed  Done erx

## 2015-12-29 NOTE — Telephone Encounter (Signed)
Pharmacy called to verify direction of the metformin. Reviewed notes of OV and lab results.   Pt contacted and pt is very addiment about taking 3 per day. Please clarify.

## 2016-01-03 ENCOUNTER — Ambulatory Visit: Payer: Commercial Managed Care - HMO | Admitting: Family Medicine

## 2016-01-09 ENCOUNTER — Ambulatory Visit (INDEPENDENT_AMBULATORY_CARE_PROVIDER_SITE_OTHER): Payer: Commercial Managed Care - HMO | Admitting: Internal Medicine

## 2016-01-09 ENCOUNTER — Encounter: Payer: Self-pay | Admitting: Internal Medicine

## 2016-01-09 VITALS — BP 140/70 | HR 65 | Ht 71.0 in | Wt 158.4 lb

## 2016-01-09 DIAGNOSIS — I471 Supraventricular tachycardia: Secondary | ICD-10-CM | POA: Diagnosis not present

## 2016-01-09 NOTE — Patient Instructions (Signed)
Medication Instructions:  Your physician recommends that you continue on your current medications as directed. Please refer to the Current Medication list given to you today.  Labwork: None ordered.  Testing/Procedures: None ordered.  Follow-Up: Your physician recommends that you schedule a follow-up appointment as needed with Dr. Taylor  Any Other Special Instructions Will Be Listed Below (If Applicable).     If you need a refill on your cardiac medications before your next appointment, please call your pharmacy.  

## 2016-01-09 NOTE — Progress Notes (Signed)
HPI Keith Herrera returns today for followup of SVT. The patient is a very pleasant 82 year old man with a history of coronary artery disease and hypertension, status post percutaneous coronary intervention. He has undergone EP study but no ablation as he had no inducible SVT. In the interim, he has had problems with palpitations and HTN. His blood pressure had been high. He had been on coreg but sounds like he had symptomatic bradycardia. I asked him on his last visit to reduce his caffeine intake and to wear a cardiac monitor which demonstrated PVC's, PAC's and NSVT but no sustained arrhythmias. No symptomatic bradycardia. He notes that since he cut out caffeine, he is much improved. 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   Allergies  Allergen Reactions  . Crestor [Rosuvastatin Calcium] Rash    All over body  . Lovastatin Itching and Rash     Current Outpatient Prescriptions  Medication Sig Dispense Refill  . amLODipine (NORVASC) 5 MG tablet Take 5 mg by mouth daily.     Marland Kitchen aspirin EC 81 MG tablet Take 81 mg by mouth daily after supper.    Marland Kitchen atorvastatin (LIPITOR) 10 MG tablet Take 10 mg by mouth daily.  2  . carvedilol (COREG) 6.25 MG tablet Take 1 tablet (6.25 mg total) by mouth 2 (two) times daily with a meal. 180 tablet 1  . ibuprofen (ADVIL,MOTRIN) 200 MG tablet Take 400 mg by mouth every 6 (six) hours as needed (pain).    Marland Kitchen lansoprazole (PREVACID) 30 MG capsule Take 30 mg by mouth daily at 12 noon.    Marland Kitchen losartan (COZAAR) 100 MG tablet Take 100 mg by mouth daily.    . metFORMIN (GLUCOPHAGE) 500 MG tablet Take 2 tabs by mouth in the am & 1 tab by mouth in the pm     No current facility-administered medications for this visit.     Past Medical History  Diagnosis Date  . GERD (gastroesophageal reflux disease)   . Hiatal hernia   . Hemorrhoids   . Adenomatous polyp of colon 05/2004  . Diverticulosis    colon  . Hypertension   . Hyperlipidemia   . Diabetes type 2, controlled ([place])   . Anxiety   . CAD (coronary artery disease)     s/p Cypher DES to LAD and pD1 2010;  LHC was done 6/12: EF 65%, circumflex patent, mid RCA 80-90% (small and nondominant), LAD and diagonal stents patent, LAD at the origin of the first diagonal 30%, ostial D2 90%, mid 80-90% (small vessel).  Continued medical therapy was recommended.   . Right inguinal hernia     s/p repair in 8/12  . Esophageal stricture   . ED (erectile dysfunction)   . Allergic rhinitis   . IBS (irritable bowel syndrome)   . BPH (benign prostatic hyperplasia)   . Carotid stenosis     dopplers 02/2012: [id number] RICA; [id number] LICA  . SVT (supraventricular tachycardia) ([facility])     ROS:   All systems reviewed and negative except as noted in the HPI.   Past Surgical History  Procedure Laterality Date  . Coronary stent placement  10/2008    x 2  . Hernia repair      RIH with ultrapro patch  . Electrophysiologic study N/A 03/20/2015    no inducible SVT - Dr Lovena Le     Family History  Problem Relation Age of Onset  .  Cancer Mother 28    unknown cancer  . Cancer Sister 67    unknown cancer     Social History   Social History  . Marital Status: Married    Spouse Name: N/A  . Number of Children: 3  . Years of Education: N/A   Occupational History  . Recruitment consultant for [employer] History Main Topics  . Smoking status: Former Research scientist (life sciences)  . Smokeless tobacco: Not on file     Comment: smoked in teens  . Alcohol Use: No  . Drug Use: No  . Sexual Activity: Not on file   Other Topics Concern  . Not on file   Social History Narrative     BP 140/70 mmHg  Pulse 65  Ht 5\' 11"  (1.803 m)  Wt 158 lb 6.4 oz (71.85 kg)  BMI 22.10 kg/m2  SpO2 98%  Physical Exam:  Well appearing 82 year old man, NAD HEENT: Unremarkable Neck:  7 cm JVD, no thyromegally Back:  No CVA tenderness Lungs:  Clear with no wheezes, rales, or  rhonchi HEART:  Regular rate rhythm, no murmurs, no rubs, no clicks Abd:  soft, positive bowel sounds, no organomegally, no rebound, no guarding Ext:  2 plus pulses, no edema, no cyanosis, no clubbing Skin:  No rashes no nodules Neuro:  CN II through XII intact, motor grossly intact  holter monitor - reviewed and as above  Assess/Plan: 1. CAD - he has no anginal symptoms 2. SVT - he wore a cardiac monitor and had no SVT. Only NSVT. No significant bradycardia 3. HTN - his blood pressure is slightly elevated today. He notes that at home it is better. No change in meds today. He is encouraged to maintain a low sodium diet.  Mikle Bosworth.D.

## 2016-01-11 ENCOUNTER — Encounter: Payer: Self-pay | Admitting: Gastroenterology

## 2016-01-15 ENCOUNTER — Ambulatory Visit: Payer: Commercial Managed Care - HMO | Admitting: Family Medicine

## 2016-01-16 ENCOUNTER — Telehealth: Payer: Self-pay | Admitting: Internal Medicine

## 2016-01-16 DIAGNOSIS — Z79899 Other long term (current) drug therapy: Secondary | ICD-10-CM | POA: Diagnosis not present

## 2016-01-16 DIAGNOSIS — R404 Transient alteration of awareness: Secondary | ICD-10-CM | POA: Diagnosis not present

## 2016-01-16 DIAGNOSIS — E119 Type 2 diabetes mellitus without complications: Secondary | ICD-10-CM | POA: Diagnosis not present

## 2016-01-16 DIAGNOSIS — R42 Dizziness and giddiness: Secondary | ICD-10-CM | POA: Diagnosis not present

## 2016-01-16 DIAGNOSIS — Z7982 Long term (current) use of aspirin: Secondary | ICD-10-CM | POA: Diagnosis not present

## 2016-01-16 DIAGNOSIS — K219 Gastro-esophageal reflux disease without esophagitis: Secondary | ICD-10-CM | POA: Diagnosis not present

## 2016-01-16 DIAGNOSIS — I10 Essential (primary) hypertension: Secondary | ICD-10-CM | POA: Diagnosis not present

## 2016-01-16 DIAGNOSIS — I2510 Atherosclerotic heart disease of native coronary artery without angina pectoris: Secondary | ICD-10-CM | POA: Diagnosis not present

## 2016-01-16 DIAGNOSIS — R110 Nausea: Secondary | ICD-10-CM | POA: Diagnosis not present

## 2016-01-16 NOTE — Telephone Encounter (Signed)
PLEASE NOTE: All timestamps contained within this report are represented as [place] Standard Time. CONFIDENTIALTY NOTICE: This fax transmission is intended only for the addressee. It contains information that is legally privileged, confidential or otherwise protected from use or disclosure. If you are not the intended recipient, you are strictly prohibited from reviewing, disclosing, copying using or disseminating any of this information or taking any action in reliance on or regarding this information. If you have received this fax in error, please notify us immediately by telephone so that we can arrange for its return to [place]. 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 If symptomatic, describe symptoms. You must click the next button to save text entered. ---Caller states he woke up this AM with dizziness/vertigo, stumbling, and only able to walk if he holds onto things. Nauseated. No vomiting. Felt a little warm earlier, but not now. He was sweating at that time. No CP, SOB. Generalized weakness. 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 Has the patient traveled out of the country within the last 30 days? ---No Does the patient have any new or worsening symptoms? ---Yes Will a triage be completed? ---Yes Related visit to physician within the last 2 weeks? ---No Does the PT have any chronic conditions? (i.e.  diabetes, asthma, etc.) ---Yes List chronic conditions. ---HTN, NIDDM - on Metformin Is this a behavioral health or substance abuse call? ---No Guidelines Guideline Title Affirmed Question Affirmed Notes Dizziness - Vertigo SEVERE dizziness (vertigo) (e.g., unable to walk without assistance) Final Disposition User Go to ED Now (or PCP triage) Markus Daft, RN, [place] Referrals GO TO FACILITY UNDECIDED Disagree/Comply: Comply Caller states that he is waiting for the ride still, and that they are An hr away from coming to pick him up.  RN advised that he go ahead  And notify his wife, and call 911.  He verbalized understanding.

## 2016-01-22 ENCOUNTER — Encounter: Payer: Self-pay | Admitting: Internal Medicine

## 2016-01-22 ENCOUNTER — Ambulatory Visit (INDEPENDENT_AMBULATORY_CARE_PROVIDER_SITE_OTHER): Payer: Commercial Managed Care - HMO | Admitting: Internal Medicine

## 2016-01-22 VITALS — BP 138/78 | HR 59 | Temp 98.2°F | Resp 20 | Wt 160.0 lb

## 2016-01-22 DIAGNOSIS — I2510 Atherosclerotic heart disease of native coronary artery without angina pectoris: Secondary | ICD-10-CM

## 2016-01-22 DIAGNOSIS — I10 Essential (primary) hypertension: Secondary | ICD-10-CM

## 2016-01-22 DIAGNOSIS — R0989 Other specified symptoms and signs involving the circulatory and respiratory systems: Secondary | ICD-10-CM

## 2016-01-22 DIAGNOSIS — K580 Irritable bowel syndrome with diarrhea: Secondary | ICD-10-CM

## 2016-01-22 DIAGNOSIS — R42 Dizziness and giddiness: Secondary | ICD-10-CM

## 2016-01-22 NOTE — Progress Notes (Signed)
Pre visit review using our clinic review tool, if applicable. No additional management support is needed unless otherwise documented below in the visit note. 

## 2016-01-22 NOTE — Assessment & Plan Note (Signed)
With recent diarrhea and wt loss, pt rquests Gi referral, already has appt

## 2016-01-22 NOTE — Assessment & Plan Note (Signed)
C/w BPV per ED visit with ENT recently, pt asks for f/u as well with neurology, o/w to cont meclizine prn

## 2016-01-22 NOTE — Assessment & Plan Note (Signed)
stable overall by history and exam, recent data reviewed with pt, and pt to continue medical treatment as before,  to f/u any worsening symptoms or concerns, no recent arrythmias, ok to cont drive for work to June 1, but would need repeat eval after this

## 2016-01-22 NOTE — Patient Instructions (Signed)
Please continue all other medications as before, and refills have been done if requested.  Please have the pharmacy call with any other refills you may need.  Please continue your efforts at being more active, low cholesterol diet, and weight control.  You are otherwise up to date with prevention measures today.  Please keep your appointments with your specialists as you may have planned  You will be contacted regarding the referral for: Gastroenterology, Neurology, and carotid dopplers   You will be contacted by phone if any changes need to be made immediately.  Otherwise, you will receive a letter about your results with an explanation, but please check with MyChart first.  Please remember to sign up for MyChart if you have not done so, as this will be important to you in the future with finding out test results, communicating by private email, and scheduling acute appointments online when needed.  You are given the work note today

## 2016-01-22 NOTE — Assessment & Plan Note (Signed)
Overdue carotids - for f/u referral

## 2016-01-22 NOTE — Assessment & Plan Note (Signed)
stable overall by history and exam, recent data reviewed with pt, and pt to continue medical treatment as before,  to f/u any worsening symptoms or concerns BP Readings from Last 3 Encounters:  01/22/16 138/78  01/09/16 140/70  12/27/15 150/80

## 2016-01-22 NOTE — Progress Notes (Signed)
Subjective:    Patient ID: Keith Herrera, male    DOB: 04/03/1932, 82 y.o.   MRN: [id number]  HPI  Here to f/u  recent ED visit in [place], to him he must have had some type of reaction to an odd odor in a hotel room, went to dinner but came back and suddenly just felt drunk and dizzy, had to lie on the be all night.  Getting up and coffee intake did not help.  Called wife, then called here, advised to call 911, seen at ED, brings lab results from the visit here, glc 199, hgb 15,4, BP noted elevated to start visit at ED but improved.  MRI head neg per pt report. Seen per ENT in the ED  Tx with meclizine 25 mg prn for BPV, allowed home as he was afeb, ambulatory, exam and lab/MRI essentially neg for acute.  Meclizine can cause drowsiness so does not take very often.  Still with some blurred vision most of the week, despite glasses that need to be changed, better with more recent prescriptoin sunglasses.. Also with 1 mo worsening diarrhea, has appt with DR Fuller Plan mar 18, will need referral for insurance purpose. Denies worsening reflux, abd pain, dysphagia, n/v, other bowel change or blood.  Pt denies fever,night sweats, loss of appetite, or other constitutional symptoms except loss of about 6 lbs in last 6 month per pt. Overdue for carotid f/u. Pt denies chest pain, increased sob or doe, wheezing, orthopnea, PND, increased LE swelling, palpitations, dizziness or syncope.  Asks for note to employer to state he is acceptable risk to drive. Wt Readings from Last 3 Encounters:  01/22/16 160 lb (72.576 kg)  01/09/16 158 lb 6.4 oz (71.85 kg)  12/27/15 164 lb 4 oz (74.503 kg)    Past Medical History  Diagnosis Date  . GERD (gastroesophageal reflux disease)   . Hiatal hernia   . Hemorrhoids   . Adenomatous polyp of colon 05/2004  . Diverticulosis     colon  . Hypertension   . Hyperlipidemia   . Diabetes type 2, controlled ([place])   . Anxiety   . CAD (coronary artery disease)     s/p Cypher DES to  LAD and pD1 2010;  LHC was done 6/12: EF 65%, circumflex patent, mid RCA 80-90% (small and nondominant), LAD and diagonal stents patent, LAD at the origin of the first diagonal 30%, ostial D2 90%, mid 80-90% (small vessel).  Continued medical therapy was recommended.   . Right inguinal hernia     s/p repair in 8/12  . Esophageal stricture   . ED (erectile dysfunction)   . Allergic rhinitis   . IBS (irritable bowel syndrome)   . BPH (benign prostatic hyperplasia)   . Carotid stenosis     dopplers 02/2012: [id number] RICA; [id number] LICA  . SVT (supraventricular tachycardia) [facility])    Past Surgical History  Procedure Laterality Date  . Coronary stent placement  10/2008    x 2  . Hernia repair      RIH with ultrapro patch  . Electrophysiologic study N/A 03/20/2015    no inducible SVT - Dr Lovena Le    reports that he has quit smoking. He does not have any smokeless tobacco history on file. He reports that he does not drink alcohol or use illicit drugs. family history includes Cancer (age of onset: 85) in his mother; Cancer (age of onset: 46) in his sister. Allergies  Allergen Reactions  . Crestor [employer]  Calcium] Rash    All over body  . Lovastatin Itching and Rash   Current Outpatient Prescriptions on File Prior to Visit  Medication Sig Dispense Refill  . amLODipine (NORVASC) 5 MG tablet Take 5 mg by mouth daily.     Marland Kitchen aspirin EC 81 MG tablet Take 81 mg by mouth daily after supper.    Marland Kitchen atorvastatin (LIPITOR) 10 MG tablet Take 10 mg by mouth daily.  2  . carvedilol (COREG) 6.25 MG tablet Take 1 tablet (6.25 mg total) by mouth 2 (two) times daily with a meal. 180 tablet 1  . ibuprofen (ADVIL,MOTRIN) 200 MG tablet Take 400 mg by mouth every 6 (six) hours as needed (pain).    Marland Kitchen lansoprazole (PREVACID) 30 MG capsule Take 30 mg by mouth daily at 12 noon.    Marland Kitchen losartan (COZAAR) 100 MG tablet Take 100 mg by mouth daily.    . metFORMIN (GLUCOPHAGE) 500 MG tablet Take 2 tabs by mouth in the am & 1  tab by mouth in the pm     No current facility-administered medications on file prior to visit.   Review of Systems  Constitutional: Negative for unusual diaphoresis or night sweats HENT: Negative for ringing in ear or discharge Eyes: Negative for double vision or worsening visual disturbance.  Respiratory: Negative for choking and stridor.   Gastrointestinal: Negative for vomiting or other signifcant bowel change Genitourinary: Negative for hematuria or change in urine volume.  Musculoskeletal: Negative for other MSK pain or swelling Skin: Negative for color change and worsening wound.  Neurological: Negative for tremors and numbness other than noted  Psychiatric/Behavioral: Negative for decreased concentration or agitation other than above       Objective:   Physical Exam BP 138/78 mmHg  Pulse 59  Temp(Src) 98.2 F (36.8 C) (Oral)  Resp 20  Wt 160 lb (72.576 kg)  SpO2 96% VS noted,  Constitutional: Pt appears in no significant distress HENT: Head: NCAT.  Right Ear: External ear normal.  Left Ear: External ear normal.  Eyes: . Pupils are equal, round, and reactive to light. Conjunctivae and EOM are normal Neck: Normal range of motion. Neck supple.  Cardiovascular: Normal rate and regular rhythm.   Pulmonary/Chest: Effort normal and breath sounds without rales or wheezing.  Abd:  Soft, NT, ND, + BS Neurological: Pt is alert. Not confused , motor 5/5 intact Skin: Skin is warm. No rash, no LE edema Psychiatric: Pt behavior is normal. No agitation.     Assessment & Plan:

## 2016-02-06 ENCOUNTER — Ambulatory Visit (INDEPENDENT_AMBULATORY_CARE_PROVIDER_SITE_OTHER): Payer: Commercial Managed Care - HMO | Admitting: Internal Medicine

## 2016-02-06 ENCOUNTER — Ambulatory Visit: Payer: Commercial Managed Care - HMO | Admitting: Internal Medicine

## 2016-02-06 ENCOUNTER — Encounter: Payer: Self-pay | Admitting: Internal Medicine

## 2016-02-06 VITALS — BP 130/80 | HR 61 | Temp 99.1°F | Resp 20 | Wt 167.0 lb

## 2016-02-06 DIAGNOSIS — R05 Cough: Secondary | ICD-10-CM | POA: Diagnosis not present

## 2016-02-06 DIAGNOSIS — E119 Type 2 diabetes mellitus without complications: Secondary | ICD-10-CM | POA: Diagnosis not present

## 2016-02-06 DIAGNOSIS — I10 Essential (primary) hypertension: Secondary | ICD-10-CM | POA: Diagnosis not present

## 2016-02-06 DIAGNOSIS — R059 Cough, unspecified: Secondary | ICD-10-CM

## 2016-02-06 DIAGNOSIS — R062 Wheezing: Secondary | ICD-10-CM | POA: Diagnosis not present

## 2016-02-06 MED ORDER — PREDNISONE 10 MG PO TABS: ORAL_TABLET | ORAL | Status: DC

## 2016-02-06 MED ORDER — LEVOFLOXACIN 500 MG PO TABS: 500.0000 mg | ORAL_TABLET | Freq: Every day | ORAL | Status: DC

## 2016-02-06 MED ORDER — HYDROCODONE-HOMATROPINE 5-1.5 MG/5ML PO SYRP: 5.0000 mL | ORAL_SOLUTION | Freq: Four times a day (QID) | ORAL | Status: DC | PRN

## 2016-02-06 NOTE — Progress Notes (Addendum)
Subjective:    Patient ID: Keith Herrera, male    DOB: 08/25/1932, 82 y.o.   MRN: [id number]  HPI  Here with acute onset mild to mod 4 days ST, HA, general weakness and malaise, with prod cough greenish sputum, but Pt denies chest pain, increased sob or doe, wheezing, orthopnea, PND, increased LE swelling, palpitations, dizziness or syncope, except for onset mild wheeziness in last 2 days.  Has low grade temp today.  Pt denies new neurological symptoms such as new headache, or facial or extremity weakness or numbness   Pt denies polydipsia, polyuria,   Pt states overall good compliance with meds, trying to follow lower cholesterol, diabetic diet, wt overall stable to increased with some less active Wt Readings from Last 3 Encounters:  02/06/16 167 lb (75.751 kg)  01/22/16 160 lb (72.576 kg)  01/09/16 158 lb 6.4 oz (71.85 kg)   Past Medical History  Diagnosis Date  . GERD (gastroesophageal reflux disease)   . Hiatal hernia   . Hemorrhoids   . Adenomatous polyp of colon 05/2004  . Diverticulosis     colon  . Hypertension   . Hyperlipidemia   . Diabetes type 2, controlled ([place])   . Anxiety   . CAD (coronary artery disease)     s/p Cypher DES to LAD and pD1 2010;  LHC was done 6/12: EF 65%, circumflex patent, mid RCA 80-90% (small and nondominant), LAD and diagonal stents patent, LAD at the origin of the first diagonal 30%, ostial D2 90%, mid 80-90% (small vessel).  Continued medical therapy was recommended.   . Right inguinal hernia     s/p repair in 8/12  . Esophageal stricture   . ED (erectile dysfunction)   . Allergic rhinitis   . IBS (irritable bowel syndrome)   . BPH (benign prostatic hyperplasia)   . Carotid stenosis     dopplers 02/2012: [id number] RICA; [id number] LICA  . SVT (supraventricular tachycardia) [facility])    Past Surgical History  Procedure Laterality Date  . Coronary stent placement  10/2008    x 2  . Hernia repair      RIH with ultrapro patch  . Electrophysiologic  study N/A 03/20/2015    no inducible SVT - Dr Lovena Le    reports that he has quit smoking. He does not have any smokeless tobacco history on file. He reports that he does not drink alcohol or use illicit drugs. family history includes Cancer (age of onset: 64) in his mother; Cancer (age of onset: 64) in his sister. Allergies  Allergen Reactions  . Crestor [Rosuvastatin Calcium] Rash    All over body  . Lovastatin Itching and Rash   Current Outpatient Prescriptions on File Prior to Visit  Medication Sig Dispense Refill  . amLODipine (NORVASC) 5 MG tablet Take 5 mg by mouth daily.     Marland Kitchen aspirin EC 81 MG tablet Take 81 mg by mouth daily after supper.    Marland Kitchen atorvastatin (LIPITOR) 10 MG tablet Take 10 mg by mouth daily.  2  . carvedilol (COREG) 6.25 MG tablet Take 1 tablet (6.25 mg total) by mouth 2 (two) times daily with a meal. 180 tablet 1  . ibuprofen (ADVIL,MOTRIN) 200 MG tablet Take 400 mg by mouth every 6 (six) hours as needed (pain).    Marland Kitchen lansoprazole (PREVACID) 30 MG capsule Take 30 mg by mouth daily at 12 noon.    Marland Kitchen losartan (COZAAR) 100 MG tablet Take 100 mg by mouth daily.    Marland Kitchen  metFORMIN (GLUCOPHAGE) 500 MG tablet Take 2 tabs by mouth in the am & 1 tab by mouth in the pm     No current facility-administered medications on file prior to visit.   Review of Systems  Constitutional: Negative for unusual diaphoresis or night sweats HENT: Negative for ringing in ear or discharge Eyes: Negative for double vision or worsening visual disturbance.  Respiratory: Negative for choking and stridor.   Gastrointestinal: Negative for vomiting or other signifcant bowel change Genitourinary: Negative for hematuria or change in urine volume.  Musculoskeletal: Negative for other MSK pain or swelling Skin: Negative for color change and worsening wound.  Neurological: Negative for tremors and numbness other than noted  Psychiatric/Behavioral: Negative for decreased concentration or agitation other than  above       Objective:   Physical Exam BP 130/80 mmHg  Pulse 61  Temp(Src) 99.1 F (37.3 C) (Oral)  Resp 20  Wt 167 lb (75.751 kg)  SpO2 94% VS noted,  Constitutional: Pt appears in no significant distress HENT: Head: NCAT.  Right Ear: External ear normal.  Left Ear: External ear normal.  Eyes: . Pupils are equal, round, and reactive to light. Conjunctivae and EOM are normal Neck: Normal range of motion. Neck supple.  Cardiovascular: Normal rate and regular rhythm.   Pulmonary/Chest: Effort normal and breath sounds decreased with bilat right > left wheeze and rhonchi, and few LLL rales noted Neurological: Pt is alert. Not confused , motor grossly intact Skin: Skin is warm. No rash, no LE edema Psychiatric: Pt behavior is normal. No agitation.     Assessment & Plan:

## 2016-02-06 NOTE — Patient Instructions (Addendum)
.  You had the steroid shot today, and the antibiotic shot as well  Please take all new medication as prescribed - the antibiotic, cough medicine, and prednisone  You can also take Mucinex (or it's generic off brand) for congestion, and tylenol as needed for pain.  Please continue all other medications as before, and refills have been done if requested.  Please have the pharmacy call with any other refills you may need.  Please keep your appointments with your specialists as you may have planned  Please go to the XRAY Department in the Basement (go straight as you get off the elevator) for the x-ray testing tomorrow (no appointment needed)  You will be contacted by phone if any changes need to be made immediately.  Otherwise, you will receive a letter about your results with an explanation, but please check with MyChart first.  Please remember to sign up for MyChart if you have not done so, as this will be important to you in the future with finding out test results, communicating by private email, and scheduling acute appointments online when needed.

## 2016-02-06 NOTE — Progress Notes (Signed)
Pre visit review using our clinic review tool, if applicable. No additional management support is needed unless otherwise documented below in the visit note. 

## 2016-02-07 ENCOUNTER — Ambulatory Visit (INDEPENDENT_AMBULATORY_CARE_PROVIDER_SITE_OTHER)
Admission: RE | Admit: 2016-02-07 | Discharge: 2016-02-07 | Disposition: A | Discharge status: Home / Self Care | Payer: Commercial Managed Care - HMO | Source: Ambulatory Visit | Attending: Internal Medicine | Admitting: Internal Medicine

## 2016-02-07 DIAGNOSIS — R059 Cough, unspecified: Secondary | ICD-10-CM

## 2016-02-07 DIAGNOSIS — R05 Cough: Secondary | ICD-10-CM | POA: Diagnosis not present

## 2016-02-07 DIAGNOSIS — R062 Wheezing: Secondary | ICD-10-CM | POA: Diagnosis not present

## 2016-02-07 NOTE — Assessment & Plan Note (Signed)
Mild to mod, for depomedrol IM, predpac asd, to f/u any worsening symptoms or concerns 

## 2016-02-07 NOTE — Assessment & Plan Note (Addendum)
Mild to mod, c/w bronchitis vs pna, for antibx course po after rocephin IM today, cough med prn, and cxr, to f/u any worsening symptoms or concerns

## 2016-02-07 NOTE — Assessment & Plan Note (Signed)
stable overall by history and exam, recent data reviewed with pt, and pt to continue medical treatment as before,  to f/u any worsening symptoms or concerns Lab Results  Component Value Date   HGBA1C 6.8* 12/21/2015

## 2016-02-07 NOTE — Assessment & Plan Note (Signed)
stable overall by history and exam, recent data reviewed with pt, and pt to continue medical treatment as before,  to f/u any worsening symptoms or concerns BP Readings from Last 3 Encounters:  02/06/16 130/80  01/22/16 138/78  01/09/16 140/70

## 2016-02-12 ENCOUNTER — Encounter: Payer: Self-pay | Admitting: Neurology

## 2016-02-12 ENCOUNTER — Ambulatory Visit (INDEPENDENT_AMBULATORY_CARE_PROVIDER_SITE_OTHER): Payer: Commercial Managed Care - HMO | Admitting: Neurology

## 2016-02-12 ENCOUNTER — Telehealth: Payer: Self-pay | Admitting: Internal Medicine

## 2016-02-12 VITALS — BP 140/84 | HR 61 | Ht 71.0 in | Wt 161.3 lb

## 2016-02-12 DIAGNOSIS — I6522 Occlusion and stenosis of left carotid artery: Secondary | ICD-10-CM | POA: Diagnosis not present

## 2016-02-12 DIAGNOSIS — H8112 Benign paroxysmal vertigo, left ear: Secondary | ICD-10-CM | POA: Diagnosis not present

## 2016-02-12 NOTE — Patient Instructions (Signed)
Return to clinic as needed

## 2016-02-12 NOTE — Progress Notes (Signed)
Keith Herrera   Date: 02/12/2016  Keith Herrera MRN: [id number] DOB: [birth year]   Dear Dr. Jenny Reichmann:  Thank you for your kind referral of Keith Herrera for consultation of dizziness. Although his history is well known to you, please allow [place] to reiterate it for the purpose of our medical record. The patient was accompanied to the clinic by self.    History of Present Illness: Keith Herrera is a 82 y.o. right-handed Caucasian male with GERD, hypertension, hyperlipidemia, CAD s/p PCI with stent, SVT, diabetes mellitus, BPH, carotid stenosis (LICA [id number])  presenting for evaluation of dizzinesss.    In early March 2017, he was visiting [place], [place] and when he entered his hotel room, noticed a strong odor, possibly of maurijuana.  He brought it to the attention of the front desk clerk who changed his room, but he still felt uneasy by the smell.  The following morning, he was dizzy, nauseas, and staggering.  EMS was called and he was evaluated at the hospital with MRI brain was reportedly normal and he was diagnosed with vertigo.  He was evaluated by ENT and given meclizine which provided relief within a few days.  Since this time, he still feels that his "head feels funny".  He denies lightheadedness, numbness/tingling, or headache.  He has occasional spells of vertigo, triggered by abrupt head movements, but nothing as severe as previously.  He has blurry vision and feels this may be due to acuity changes and has an appointment in 2 weeks to get his vision checked.  A few weeks ago, he went to see his PCP cough and cold-like symptoms and has been treated with a course of antibiotics.  Overall, he is starting to feel better.  Out-side paper records, electronic medical record, and images have been reviewed where available and summarized as:  Lab Results  Component Value Date   TSH 1.78 12/21/2015   Lab Results  Component Value Date   HGBA1C 6.8* 12/21/2015    Past Medical History  Diagnosis Date  . GERD (gastroesophageal reflux disease)   . Hiatal hernia   . Hemorrhoids   . Adenomatous polyp of colon 05/2004  . Diverticulosis     colon  . Hypertension   . Hyperlipidemia   . Diabetes type 2, controlled ([place])   . Anxiety   . CAD (coronary artery disease)     s/p Cypher DES to LAD and pD1 2010;  LHC was done 6/12: EF 65%, circumflex patent, mid RCA 80-90% (small and nondominant), LAD and diagonal stents patent, LAD at the origin of the first diagonal 30%, ostial D2 90%, mid 80-90% (small vessel).  Continued medical therapy was recommended.   . Right inguinal hernia     s/p repair in 8/12  . Esophageal stricture   . ED (erectile dysfunction)   . Allergic rhinitis   . IBS (irritable bowel syndrome)   . BPH (benign prostatic hyperplasia)   . Carotid stenosis     dopplers 02/2012: [id number] RICA; [id number] LICA  . SVT (supraventricular tachycardia) [facility])     Past Surgical History  Procedure Laterality Date  . Coronary stent placement  10/2008    x 2  . Hernia repair      RIH with ultrapro patch  . Electrophysiologic study N/A 03/20/2015    no inducible SVT - Dr Lovena Le     Medications:  Outpatient Encounter Prescriptions as of 02/12/2016  Medication Sig Note  .  amLODipine (NORVASC) 5 MG tablet Take 5 mg by mouth daily.  03/31/2015: Received from: External Pharmacy  . aspirin EC 81 MG tablet Take 81 mg by mouth daily after supper.   Marland Kitchen atorvastatin (LIPITOR) 10 MG tablet Take 10 mg by mouth daily. 03/06/2015: .  . carvedilol (COREG) 6.25 MG tablet Take 1 tablet (6.25 mg total) by mouth 2 (two) times daily with a meal.   . HYDROcodone-homatropine (HYCODAN) 5-1.5 MG/5ML syrup Take 5 mLs by mouth every 6 (six) hours as needed for cough.   Marland Kitchen ibuprofen (ADVIL,MOTRIN) 200 MG tablet Take 400 mg by mouth every 6 (six) hours as needed (pain).   Marland Kitchen lansoprazole (PREVACID) 30 MG capsule Take 30 mg by mouth daily at 12 noon.   Marland Kitchen  losartan (COZAAR) 100 MG tablet Take 100 mg by mouth daily.   . metFORMIN (GLUCOPHAGE) 500 MG tablet Take 2 tabs by mouth in the am & 1 tab by mouth in the pm   . [DISCONTINUED] levofloxacin (LEVAQUIN) 500 MG tablet Take 1 tablet (500 mg total) by mouth daily.   . [DISCONTINUED] predniSONE (DELTASONE) 10 MG tablet 3 tabs by mouth per day for 3 days,2tabs per day for 3 days,1tab per day for 3 days    No facility-administered encounter medications on file as of 02/12/2016.     Allergies:  Allergies  Allergen Reactions  . Crestor [Rosuvastatin Calcium] Rash    All over body  . Lovastatin Itching and Rash    Family History: Family History  Problem Relation Age of Onset  . Cancer Mother 36    unknown cancer  . Cancer Sister 38    unknown cancer    Social History: Social History  Substance Use Topics  . Smoking status: Former Research scientist (life sciences)  . Smokeless tobacco: Never Used     Comment: smoked in teens  . Alcohol Use: No   Social History   Social History Narrative   Lives with wife in a 3 story home.  Has 2 living children.  1 passed away in a car accident.  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      Review of Systems:  CONSTITUTIONAL: No fevers, chills, night sweats, or weight loss.   EYES: +visual changes or eye pain ENT: No hearing changes.  No history of nose bleeds.   RESPIRATORY: No cough, wheezing and shortness of breath.   CARDIOVASCULAR: Negative for chest pain, and palpitations.   GI: Negative for abdominal discomfort, blood in stools or black stools.  No recent change in bowel habits.   GU:  No history of incontinence.   MUSCLOSKELETAL: No history of joint pain or swelling.  No myalgias.   SKIN: Negative for lesions, +rash, and itching.   HEMATOLOGY/ONCOLOGY: Negative for prolonged bleeding, bruising easily, and swollen nodes.  No history of cancer.   ENDOCRINE: Negative for cold or heat intolerance,  polydipsia or goiter.   PSYCH:  No depression or anxiety symptoms.   NEURO: As Above.   Vital Signs:  BP 140/84 mmHg  Pulse 61  Ht 5\' 11"  (1.803 m)  Wt 161 lb 5 oz (73.171 kg)  BMI 22.51 kg/m2  SpO2 96%   General Medical Exam:   General:  Well appearing, comfortable.   Eyes/ENT: see cranial nerve examination.   Neck: No masses appreciated.  Full range of motion without tenderness.  No carotid bruits. Respiratory:  Clear to auscultation, good air entry bilaterally.  Cardiac:  Regular rate and rhythm, no murmur.   Extremities:  No deformities, edema, or skin discoloration.  Skin:  No rashes or lesions.  Neurological Exam: MENTAL STATUS including orientation to time, place, person, recent and remote memory, attention span and concentration, language, and fund of knowledge is normal.  Speech is not dysarthric.  CRANIAL NERVES: II:  No visual field defects.  Increased vascularity of the fundus bilaterally. Optic discs are sharp.   III-IV-VI: Pupils equal round and reactive to light.  Normal conjugate, extra-ocular eye movements in all directions of gaze.  Left sided fatigable nystagmus.  Right ptosis.   V:  Normal facial sensation.   VII:  Normal facial symmetry and movements.   VIII:  Normal hearing and vestibular function.   IX-X:  Normal palatal movement.   XI:  Normal shoulder shrug and head rotation.   XII:  Normal tongue strength and range of motion, no deviation or fasciculation.  MOTOR:  No atrophy, fasciculations or abnormal movements.  No pronator drift.  Tone is normal.    Right Upper Extremity:    Left Upper Extremity:    Deltoid  5/5   Deltoid  5/5   Biceps  5/5   Biceps  5/5   Triceps  5/5   Triceps  5/5   Wrist extensors  5/5   Wrist extensors  5/5   Wrist flexors  5/5   Wrist flexors  5/5   Finger extensors  5/5   Finger extensors  5/5   Finger flexors  5/5   Finger flexors  5/5   Dorsal interossei  5/5   Dorsal interossei  5/5   Abductor pollicis  5/5    Abductor pollicis  5/5   Tone (Ashworth scale)  0  Tone (Ashworth scale)  0   Right Lower Extremity:    Left Lower Extremity:    Hip flexors  5/5   Hip flexors  5/5   Hip extensors  5/5   Hip extensors  5/5   Knee flexors  5/5   Knee flexors  5/5   Knee extensors  5/5   Knee extensors  5/5   Dorsiflexors  5/5   Dorsiflexors  5/5   Plantarflexors  5/5   Plantarflexors  5/5   Toe extensors  5/5   Toe extensors  5/5   Toe flexors  5/5   Toe flexors  5/5   Tone (Ashworth scale)  0  Tone (Ashworth scale)  0   MSRs:  Right                                                                 Left brachioradialis 2+  brachioradialis 2+  biceps 2+  biceps 2+  triceps 2+  triceps 2+  patellar 2+  patellar 2+  ankle jerk 1+  ankle jerk 1+  Hoffman no  Hoffman no  plantar response down  plantar response down   SENSORY:  Normal and symmetric perception of light touch, pinprick, vibration, and proprioception.  Romberg's sign absent.   COORDINATION/GAIT: Normal finger-to- nose-finger and heel-to-shin.  Intact rapid alternating movements bilaterally.  Able to rise from a chair without using arms.  Gait narrow based and stable. Mild unsteadiness with tandem and stressed gait.    IMPRESSION:  Mr. Kosak is an 82 year-old gentleman referred for evaluation of dizziness.  Symptoms are most consistent with benign positional vertigo, which are improving.  I offered vestibular therapy if his dizziness worsens.  No signs of orthostasis or sensory ataxia on exam.  Because he has known carotid disease, carotid ultrasound was ordered by his PCP, but needs to be scheduled.  Finally, I will request that his MRI brain report be forwarded to me for review.   Return to clinic as needed   The duration of this appointment Herrera was 40 minutes of face-to-face time with the patient.  Greater than 50% of this time was spent in counseling, explanation of diagnosis, planning of further management, and coordination of  care.   Thank you for allowing me to participate in patient's care.  If I can answer any additional questions, I would be pleased to do so.    Sincerely,    Keith Haws K. Posey Pronto, DO

## 2016-02-12 NOTE — Telephone Encounter (Signed)
Asheley called from [place] [facility] want to speak to the assistant concern about Mr. [facility] cardiologist.

## 2016-02-13 NOTE — Progress Notes (Signed)
I called [facility] and they will need a release form.  Mailed a release form to patient for him to sign and return to [place].

## 2016-02-13 NOTE — Telephone Encounter (Signed)
Spoke to [place] with [facility]. She was wondering if the pt needed to be seen by cardiology at a certain time interval. Informed that the cardiology notes indicated that patient needed to be seen as needed. OV notes indicated that the patient was doing well with SVT symptoms and with bradycardia sx.

## 2016-02-29 ENCOUNTER — Telehealth: Payer: Self-pay | Admitting: Neurology

## 2016-02-29 NOTE — Telephone Encounter (Signed)
OSH records rec'd from [facility], [place].  CT head wo contrast 01/16/2016:  No acute intracranial process.  Mild periventricular white matter disease representing chronic microvascular changes.

## 2016-03-05 ENCOUNTER — Encounter: Payer: Self-pay | Admitting: Gastroenterology

## 2016-03-05 ENCOUNTER — Ambulatory Visit (INDEPENDENT_AMBULATORY_CARE_PROVIDER_SITE_OTHER): Payer: Commercial Managed Care - HMO | Admitting: Gastroenterology

## 2016-03-05 VITALS — BP 162/70 | HR 64 | Ht 68.25 in | Wt 157.4 lb

## 2016-03-05 DIAGNOSIS — H5203 Hypermetropia, bilateral: Secondary | ICD-10-CM | POA: Diagnosis not present

## 2016-03-05 DIAGNOSIS — R197 Diarrhea, unspecified: Secondary | ICD-10-CM | POA: Diagnosis not present

## 2016-03-05 DIAGNOSIS — H5210 Myopia, unspecified eye: Secondary | ICD-10-CM | POA: Diagnosis not present

## 2016-03-05 DIAGNOSIS — E119 Type 2 diabetes mellitus without complications: Secondary | ICD-10-CM | POA: Diagnosis not present

## 2016-03-05 DIAGNOSIS — R634 Abnormal weight loss: Secondary | ICD-10-CM | POA: Diagnosis not present

## 2016-03-05 DIAGNOSIS — Z86010 Personal history of colonic polyps: Secondary | ICD-10-CM | POA: Diagnosis not present

## 2016-03-05 NOTE — Patient Instructions (Signed)
Continue lactose free diet and call back and ask to speak to a nurse if your diarrhea gets worse.  Thank you for choosing me and [facility].  Pricilla Riffle. Dagoberto Ligas., MD., Marval Regal

## 2016-03-05 NOTE — Progress Notes (Signed)
    History of Present Illness: This is an 82 year old male referred by [name], MD for the evaluation of diarrhea and 7-8 pound weight loss. Patient relates diarrhea began about 2 months ago with 3-4 loose, urgent, nonbloody stools per day. The symptoms resolved and then returned without clear cause. He decreased caffeine intake and eliminated lactose intake and his symptoms have resolved over the past 2 weeks. His weight has stabilized. After the onset of diarrhea he was treated for pneumonia with a course of antibiotics. Did note slight worsening diarrhea while on antibiotics. He's also been treated for vertigo. He has been maintained on metformin at a stable dose for the past few years. He previously underwent colonoscopy in 09/2008 showing 2 small adenomatous colon polyps and internal hemorrhoids. He did not return for recommended five-year surveillance colonoscopy in 2014. Denies abdominal pain, constipation, change in stool caliber, melena, hematochezia, nausea, vomiting, dysphagia, reflux symptoms, chest pain.   Review of Systems: Pertinent positive and negative review of systems were noted in the above HPI section. All other review of systems were otherwise negative.  Current Medications, Allergies, Past Medical History, Past Surgical History, Family History and Social History were reviewed in [employer] record.  Physical Exam: General: Well developed, well nourished, no acute distress Head: Normocephalic and atraumatic Eyes:  sclerae anicteric, EOMI Ears: Normal auditory acuity Mouth: No deformity or lesions Neck: Supple, no masses or thyromegaly Lungs: Clear throughout to auscultation Heart: Regular rate and rhythm; no murmurs, rubs or bruits Abdomen: Soft, non tender and non distended. No masses, hepatosplenomegaly or hernias noted. Normal Bowel sounds Musculoskeletal: Symmetrical with no gross deformities  Skin: No lesions on visible extremities Pulses:   Normal pulses noted Extremities: No clubbing, cyanosis, edema or deformities noted Neurological: Alert oriented x 4, grossly nonfocal Cervical Nodes:  No significant cervical adenopathy Inguinal Nodes: No significant inguinal adenopathy Psychological:  Alert and cooperative. Normal mood and affect  Assessment and Recommendations:  1. Diarrhea and 7-8 pound weight loss. Symptoms are currently resolved and his weight has stabilized. He may have developed lactose intolerance as his symptoms improved off lactose and he remains lactose free. Observe for the next several weeks on a lactose free diet and if his symptoms return will plan to proceed with stool studies, Hemoccults and colonoscopy.  2. Personal history of adenomatous colon polyps. Given his age will not plan for routine surveillance colonoscopies.   cc: [name], MD [address] [place], [place] [address]

## 2016-03-24 ENCOUNTER — Other Ambulatory Visit: Payer: Self-pay | Admitting: Internal Medicine

## 2016-04-22 ENCOUNTER — Other Ambulatory Visit: Payer: Self-pay | Admitting: Internal Medicine

## 2016-04-22 DIAGNOSIS — R42 Dizziness and giddiness: Secondary | ICD-10-CM

## 2016-04-22 DIAGNOSIS — I6523 Occlusion and stenosis of bilateral carotid arteries: Secondary | ICD-10-CM

## 2016-04-22 DIAGNOSIS — R0989 Other specified symptoms and signs involving the circulatory and respiratory systems: Secondary | ICD-10-CM

## 2016-05-01 ENCOUNTER — Ambulatory Visit (HOSPITAL_COMMUNITY)
Admission: RE | Admit: 2016-05-01 | Discharge: 2016-05-01 | Disposition: A | Discharge status: Home / Self Care | Payer: Commercial Managed Care - HMO | Source: Ambulatory Visit | Attending: Internal Medicine | Admitting: Internal Medicine

## 2016-05-01 DIAGNOSIS — F419 Anxiety disorder, unspecified: Secondary | ICD-10-CM | POA: Diagnosis not present

## 2016-05-01 DIAGNOSIS — E785 Hyperlipidemia, unspecified: Secondary | ICD-10-CM | POA: Diagnosis not present

## 2016-05-01 DIAGNOSIS — R0989 Other specified symptoms and signs involving the circulatory and respiratory systems: Secondary | ICD-10-CM | POA: Insufficient documentation

## 2016-05-01 DIAGNOSIS — R42 Dizziness and giddiness: Secondary | ICD-10-CM

## 2016-05-01 DIAGNOSIS — E119 Type 2 diabetes mellitus without complications: Secondary | ICD-10-CM | POA: Insufficient documentation

## 2016-05-01 DIAGNOSIS — I2510 Atherosclerotic heart disease of native coronary artery without angina pectoris: Secondary | ICD-10-CM | POA: Insufficient documentation

## 2016-05-01 DIAGNOSIS — K219 Gastro-esophageal reflux disease without esophagitis: Secondary | ICD-10-CM | POA: Insufficient documentation

## 2016-05-01 DIAGNOSIS — I6523 Occlusion and stenosis of bilateral carotid arteries: Secondary | ICD-10-CM | POA: Diagnosis not present

## 2016-05-01 DIAGNOSIS — I10 Essential (primary) hypertension: Secondary | ICD-10-CM | POA: Diagnosis not present

## 2016-05-31 ENCOUNTER — Other Ambulatory Visit: Payer: Self-pay

## 2016-05-31 MED ORDER — CARVEDILOL 6.25 MG PO TABS: 6.2500 mg | ORAL_TABLET | Freq: Two times a day (BID) | ORAL | Status: DC

## 2016-05-31 NOTE — Telephone Encounter (Signed)
Pt called requesting refill of Coreg. Refill sent to [place], pt advised of same

## 2016-06-11 ENCOUNTER — Other Ambulatory Visit: Payer: Self-pay | Admitting: Internal Medicine

## 2016-07-15 ENCOUNTER — Other Ambulatory Visit: Payer: Self-pay | Admitting: Internal Medicine

## 2016-09-24 DIAGNOSIS — H2512 Age-related nuclear cataract, left eye: Secondary | ICD-10-CM | POA: Diagnosis not present

## 2016-09-24 DIAGNOSIS — H02839 Dermatochalasis of unspecified eye, unspecified eyelid: Secondary | ICD-10-CM | POA: Diagnosis not present

## 2016-09-24 DIAGNOSIS — H2513 Age-related nuclear cataract, bilateral: Secondary | ICD-10-CM | POA: Diagnosis not present

## 2016-09-24 DIAGNOSIS — H40003 Preglaucoma, unspecified, bilateral: Secondary | ICD-10-CM | POA: Diagnosis not present

## 2016-09-24 DIAGNOSIS — E119 Type 2 diabetes mellitus without complications: Secondary | ICD-10-CM | POA: Diagnosis not present

## 2016-09-30 DIAGNOSIS — H40003 Preglaucoma, unspecified, bilateral: Secondary | ICD-10-CM | POA: Diagnosis not present

## 2016-10-03 ENCOUNTER — Telehealth: Payer: Self-pay

## 2016-10-03 NOTE — Telephone Encounter (Signed)
Sent surgical clearance document to medical records to be faxed [facility] [phone]. Dr. Lovena Le stated "yes" pt is medically stable to proceed with eye procedure. "Low Risk".

## 2016-10-08 DIAGNOSIS — H401133 Primary open-angle glaucoma, bilateral, severe stage: Secondary | ICD-10-CM | POA: Diagnosis not present

## 2016-10-18 DIAGNOSIS — H401133 Primary open-angle glaucoma, bilateral, severe stage: Secondary | ICD-10-CM | POA: Diagnosis not present

## 2016-10-18 DIAGNOSIS — H2513 Age-related nuclear cataract, bilateral: Secondary | ICD-10-CM | POA: Diagnosis not present

## 2016-11-07 ENCOUNTER — Other Ambulatory Visit: Payer: Self-pay | Admitting: Internal Medicine

## 2016-11-20 ENCOUNTER — Telehealth: Payer: Self-pay | Admitting: Internal Medicine

## 2016-11-20 NOTE — Telephone Encounter (Signed)
Pt called in said that he has never taken the carvedilol and he is not sure what that med is .  He is wanting a nurse to call him and tell him why he is now on this med?

## 2016-11-20 NOTE — Telephone Encounter (Signed)
Spoke to patient he is aware as to what the medication is for and why he is taking it. He understood.

## 2016-11-27 DIAGNOSIS — H2513 Age-related nuclear cataract, bilateral: Secondary | ICD-10-CM | POA: Diagnosis not present

## 2016-11-27 DIAGNOSIS — H401123 Primary open-angle glaucoma, left eye, severe stage: Secondary | ICD-10-CM | POA: Insufficient documentation

## 2016-11-27 DIAGNOSIS — H401133 Primary open-angle glaucoma, bilateral, severe stage: Secondary | ICD-10-CM | POA: Diagnosis not present

## 2016-12-08 ENCOUNTER — Emergency Department (HOSPITAL_COMMUNITY): Payer: Medicare HMO

## 2016-12-08 ENCOUNTER — Inpatient Hospital Stay (HOSPITAL_COMMUNITY)
Admission: EM | Admit: 2016-12-08 | Discharge: 2016-12-11 | DRG: 287 | Disposition: A | Discharge status: Home / Self Care | Payer: Medicare HMO | Attending: Internal Medicine | Admitting: Internal Medicine

## 2016-12-08 ENCOUNTER — Encounter (HOSPITAL_COMMUNITY): Payer: Self-pay | Admitting: Emergency Medicine

## 2016-12-08 DIAGNOSIS — Z79899 Other long term (current) drug therapy: Secondary | ICD-10-CM

## 2016-12-08 DIAGNOSIS — Z7982 Long term (current) use of aspirin: Secondary | ICD-10-CM

## 2016-12-08 DIAGNOSIS — R001 Bradycardia, unspecified: Secondary | ICD-10-CM | POA: Diagnosis present

## 2016-12-08 DIAGNOSIS — Z7984 Long term (current) use of oral hypoglycemic drugs: Secondary | ICD-10-CM

## 2016-12-08 DIAGNOSIS — R002 Palpitations: Secondary | ICD-10-CM | POA: Diagnosis present

## 2016-12-08 DIAGNOSIS — K219 Gastro-esophageal reflux disease without esophagitis: Secondary | ICD-10-CM | POA: Diagnosis present

## 2016-12-08 DIAGNOSIS — R079 Chest pain, unspecified: Secondary | ICD-10-CM | POA: Diagnosis present

## 2016-12-08 DIAGNOSIS — Z8249 Family history of ischemic heart disease and other diseases of the circulatory system: Secondary | ICD-10-CM

## 2016-12-08 DIAGNOSIS — I25119 Atherosclerotic heart disease of native coronary artery with unspecified angina pectoris: Secondary | ICD-10-CM | POA: Diagnosis present

## 2016-12-08 DIAGNOSIS — I10 Essential (primary) hypertension: Secondary | ICD-10-CM | POA: Diagnosis present

## 2016-12-08 DIAGNOSIS — E1151 Type 2 diabetes mellitus with diabetic peripheral angiopathy without gangrene: Secondary | ICD-10-CM | POA: Diagnosis present

## 2016-12-08 DIAGNOSIS — Z800 Family history of malignant neoplasm of digestive organs: Secondary | ICD-10-CM | POA: Diagnosis not present

## 2016-12-08 DIAGNOSIS — Z8052 Family history of malignant neoplasm of bladder: Secondary | ICD-10-CM

## 2016-12-08 DIAGNOSIS — I200 Unstable angina: Secondary | ICD-10-CM

## 2016-12-08 DIAGNOSIS — Z888 Allergy status to other drugs, medicaments and biological substances status: Secondary | ICD-10-CM | POA: Diagnosis not present

## 2016-12-08 DIAGNOSIS — I25110 Atherosclerotic heart disease of native coronary artery with unstable angina pectoris: Secondary | ICD-10-CM | POA: Diagnosis present

## 2016-12-08 DIAGNOSIS — E785 Hyperlipidemia, unspecified: Secondary | ICD-10-CM | POA: Diagnosis present

## 2016-12-08 DIAGNOSIS — I208 Other forms of angina pectoris: Secondary | ICD-10-CM | POA: Diagnosis not present

## 2016-12-08 DIAGNOSIS — Z9861 Coronary angioplasty status: Secondary | ICD-10-CM

## 2016-12-08 DIAGNOSIS — R9439 Abnormal result of other cardiovascular function study: Secondary | ICD-10-CM | POA: Diagnosis not present

## 2016-12-08 DIAGNOSIS — E1159 Type 2 diabetes mellitus with other circulatory complications: Secondary | ICD-10-CM

## 2016-12-08 DIAGNOSIS — Z955 Presence of coronary angioplasty implant and graft: Secondary | ICD-10-CM | POA: Diagnosis not present

## 2016-12-08 DIAGNOSIS — Z87891 Personal history of nicotine dependence: Secondary | ICD-10-CM | POA: Diagnosis not present

## 2016-12-08 DIAGNOSIS — R0789 Other chest pain: Secondary | ICD-10-CM | POA: Diagnosis not present

## 2016-12-08 DIAGNOSIS — I2510 Atherosclerotic heart disease of native coronary artery without angina pectoris: Secondary | ICD-10-CM | POA: Diagnosis present

## 2016-12-08 LAB — I-STAT TROPONIN, ED: TROPONIN I, POC: 0 ng/mL (ref 0.00–0.08)

## 2016-12-08 LAB — CBC
HCT: 44.2 % (ref 39.0–52.0)
Hemoglobin: 15.2 g/dL (ref 13.0–17.0)
MCH: 30 pg (ref 26.0–34.0)
MCHC: 34.4 g/dL (ref 30.0–36.0)
MCV: 87.4 fL (ref 78.0–100.0)
PLATELETS: 169 10*3/uL (ref 150–400)
RBC: 5.06 MIL/uL (ref 4.22–5.81)
RDW: 13.3 % (ref 11.5–15.5)
WBC: 5.9 10*3/uL (ref 4.0–10.5)

## 2016-12-08 LAB — BASIC METABOLIC PANEL
Anion gap: 8 (ref 5–15)
BUN: 18 mg/dL (ref 6–20)
CALCIUM: 9.3 mg/dL (ref 8.9–10.3)
CO2: 25 mmol/L (ref 22–32)
CREATININE: 1.08 mg/dL (ref 0.61–1.24)
Chloride: 105 mmol/L (ref 101–111)
Glucose, Bld: 201 mg/dL — ABNORMAL HIGH (ref 65–99)
Potassium: 4.7 mmol/L (ref 3.5–5.1)
SODIUM: 138 mmol/L (ref 135–145)

## 2016-12-08 LAB — TROPONIN I

## 2016-12-08 MED ORDER — ATORVASTATIN CALCIUM 10 MG PO TABS
10.0000 mg | ORAL_TABLET | Freq: Every day | ORAL | Status: DC
  Administered 2016-12-09 – 2016-12-10 (×2): 10 mg via ORAL
  Filled 2016-12-08 (×2): qty 1

## 2016-12-08 MED ORDER — BRIMONIDINE TARTRATE 0.2 % OP SOLN
1.0000 [drp] | Freq: Three times a day (TID) | OPHTHALMIC | Status: DC
  Administered 2016-12-08 – 2016-12-11 (×8): 1 [drp] via OPHTHALMIC
  Filled 2016-12-08: qty 5

## 2016-12-08 MED ORDER — ASPIRIN EC 81 MG PO TBEC
81.0000 mg | DELAYED_RELEASE_TABLET | Freq: Every day | ORAL | Status: DC
  Administered 2016-12-09 – 2016-12-10 (×2): 81 mg via ORAL
  Filled 2016-12-08 (×2): qty 1

## 2016-12-08 MED ORDER — PANTOPRAZOLE SODIUM 40 MG PO TBEC
40.0000 mg | DELAYED_RELEASE_TABLET | Freq: Every day | ORAL | Status: DC
  Administered 2016-12-09 – 2016-12-11 (×3): 40 mg via ORAL
  Filled 2016-12-08 (×3): qty 1

## 2016-12-08 MED ORDER — ENOXAPARIN SODIUM 40 MG/0.4ML ~~LOC~~ SOLN
40.0000 mg | SUBCUTANEOUS | Status: DC
  Administered 2016-12-08 – 2016-12-09 (×2): 40 mg via SUBCUTANEOUS
  Filled 2016-12-08 (×2): qty 0.4

## 2016-12-08 MED ORDER — INSULIN ASPART 100 UNIT/ML ~~LOC~~ SOLN
0.0000 [IU] | Freq: Three times a day (TID) | SUBCUTANEOUS | Status: DC
  Administered 2016-12-09: 3 [IU] via SUBCUTANEOUS
  Administered 2016-12-09: 1 [IU] via SUBCUTANEOUS
  Administered 2016-12-10: 2 [IU] via SUBCUTANEOUS
  Administered 2016-12-10 – 2016-12-11 (×3): 1 [IU] via SUBCUTANEOUS

## 2016-12-08 MED ORDER — ASPIRIN 81 MG PO CHEW
324.0000 mg | CHEWABLE_TABLET | Freq: Once | ORAL | Status: AC
  Administered 2016-12-08: 324 mg via ORAL
  Filled 2016-12-08: qty 4

## 2016-12-08 MED ORDER — ONDANSETRON HCL 4 MG/2ML IJ SOLN: 4.0000 mg | Freq: Four times a day (QID) | INTRAMUSCULAR | Status: DC | PRN

## 2016-12-08 MED ORDER — AMLODIPINE BESYLATE 5 MG PO TABS: 5.0000 mg | ORAL_TABLET | Freq: Every day | ORAL | Status: DC

## 2016-12-08 MED ORDER — CARVEDILOL 3.125 MG PO TABS
6.2500 mg | ORAL_TABLET | Freq: Two times a day (BID) | ORAL | Status: DC
  Administered 2016-12-09 – 2016-12-11 (×4): 6.25 mg via ORAL
  Filled 2016-12-08 (×2): qty 2
  Filled 2016-12-08 (×3): qty 1

## 2016-12-08 MED ORDER — LOSARTAN POTASSIUM 50 MG PO TABS
100.0000 mg | ORAL_TABLET | Freq: Every day | ORAL | Status: DC
  Administered 2016-12-09 – 2016-12-11 (×3): 100 mg via ORAL
  Filled 2016-12-08 (×3): qty 2

## 2016-12-08 MED ORDER — ACETAMINOPHEN 325 MG PO TABS
650.0000 mg | ORAL_TABLET | ORAL | Status: DC | PRN
  Administered 2016-12-09 (×2): 650 mg via ORAL
  Filled 2016-12-08 (×2): qty 2

## 2016-12-08 MED ORDER — LATANOPROST 0.005 % OP SOLN
1.0000 [drp] | Freq: Every day | OPHTHALMIC | Status: DC
  Administered 2016-12-08 – 2016-12-10 (×3): 1 [drp] via OPHTHALMIC
  Filled 2016-12-08 (×2): qty 2.5

## 2016-12-08 NOTE — ED Provider Notes (Signed)
83 yo M with known CAD s/p PCI with stenting x 2 by Dr. Burt Knack in 2010, also with known pSVT followed by Lovena Le, here with fluttering and pressure-like sensation radiating to left arm. On arrival, pt mildly bradycardic which is baseline. EKG non-ischemic. Initial labs reassuring and trop is neg. CXR negative. Pt has not had stress test in >3 years, and history c/f possible anginal equivalent. May be 2/2 paroxysmal SVT but may also be primary unstable angina. D/w Dr. Radford Pax of Cardiology, who recommends hospitalist admit. Will admit to Hospitalist tele bed.   Duffy Bruce, MD 12/08/16 2059

## 2016-12-08 NOTE — ED Triage Notes (Signed)
C/o intermittent pain across chest that radiates down L arm, sob, and "heart fluttering" since yesterday.  Also reports "head feels heavy" and feeling like he is going to pass out.

## 2016-12-08 NOTE — H&P (Signed)
History and Physical    [name] [id number] DOB: [birth year] DOA: 12/08/2016  PCP: Cathlean Cower, MD  Patient coming from: Home.  Chief Complaint: Chest pain.  HPI: Keith Herrera is a 83 y.o. male with history of CAD status post stenting, history of SVT, hypertension and diabetes mellitus presents to the ER because of chest pain. Patient started experiencing chest pain last evening while at home. Chest pain lasted for around 10-15 minutes along with palpitations. Chest pain resolved without any intervention. Chest pain was pressure-like radiating to left arm. No associated shortness of breath or productive cough. In the ER EKG was showing sinus bradycardia troponins were negative chest x-ray was unremarkable. ER physician did discuss with Dr. Radford Pax cardiologist who recommended admission to further rule out ACS. At the time of my exam patient is asymptomatic.   ED Course: EKG was showing sinus bradycardia at a rate around 59 bpm. Chest x-ray unremarkable troponin negative.  Review of Systems: As per HPI, rest all negative.   Past Medical History:  Diagnosis Date  . Adenomatous polyp of colon 05/2004  . Allergic rhinitis   . Anxiety   . BPH (benign prostatic hyperplasia)   . CAD (coronary artery disease)    s/p Cypher DES to LAD and pD1 2010;  LHC was done 6/12: EF 65%, circumflex patent, mid RCA 80-90% (small and nondominant), LAD and diagonal stents patent, LAD at the origin of the first diagonal 30%, ostial D2 90%, mid 80-90% (small vessel).  Continued medical therapy was recommended.   . Carotid stenosis    dopplers 02/2012: [id number] RICA; 123456 LICA  . Diabetes type 2, controlled ([place])   . Diverticulosis    colon  . ED (erectile dysfunction)   . Esophageal stricture   . GERD (gastroesophageal reflux disease)   . Hemorrhoids   . Hiatal hernia   . Hyperlipidemia   . Hypertension   . IBS (irritable bowel syndrome)   . Right inguinal hernia    s/p repair in 8/12  . SVT  (supraventricular tachycardia) ([facility])     Past Surgical History:  Procedure Laterality Date  . CORONARY STENT PLACEMENT  10/2008   x 2  . ELECTROPHYSIOLOGIC STUDY N/A 03/20/2015   no inducible SVT - Dr Lovena Le  . INGUINAL HERNIA REPAIR Right    RIH with ultrapro patch     reports that he quit smoking about 60 years ago. His smoking use included Cigarettes. He has never used smokeless tobacco. He reports that he does not drink alcohol or use drugs.  Allergies  Allergen Reactions  . Crestor [Rosuvastatin Calcium] Rash    All over body  . Lovastatin Itching and Rash    Family History  Problem Relation Age of Onset  . Bladder Cancer Mother 50  . Heart attack Father 6    Deceased  . Pancreatic cancer Sister 65  . Healthy Son   . Healthy Daughter     Prior to Admission medications   Medication Sig Start Date End Date Taking? Authorizing Provider  amLODipine (NORVASC) 5 MG tablet Take 5 mg by mouth daily.  03/09/15  Yes Historical Provider, MD  aspirin EC 81 MG tablet Take 81 mg by mouth daily after supper.   Yes Historical Provider, MD  atorvastatin (LIPITOR) 10 MG tablet TAKE 1 TABLET EVERY DAY 06/11/16  Yes [name], MD  brimonidine [facility]) 0.2 % ophthalmic solution Place 1 drop into both eyes 3 (three) times daily. 11/29/16  Yes Historical  Provider, MD  carvedilol (COREG) 6.25 MG tablet TAKE 1 TABLET (6.25 MG TOTAL) BY MOUTH 2 (TWO) TIMES DAILY WITH A MEAL. 11/07/16  Yes [name], MD  lansoprazole (PREVACID) 30 MG capsule TAKE 1 CAPSULE (30 MG TOTAL) BY MOUTH DAILY AT 12 NOON. 07/15/16  Yes [name], MD  latanoprost (XALATAN) 0.005 % ophthalmic solution Place 1 drop into both eyes at bedtime. 11/20/16  Yes Historical Provider, MD  losartan (COZAAR) 100 MG tablet TAKE 1 TABLET EVERY DAY 03/25/16  Yes [name], MD  metFORMIN (GLUCOPHAGE) 500 MG tablet Take 500 mg by mouth 2 (two) times daily with a meal. Take 2 tabs by mouth in the am & 1 tab by mouth in the pm    Yes  Historical Provider, MD    Physical Exam: Vitals:   12/08/16 2000 12/08/16 2030 12/08/16 2100 12/08/16 2130  BP: 162/70 162/76 152/79 171/74  Pulse: (!) 58 (!) 58 (!) 53 (!) 58  Resp: 18 19 20 17   Temp:      TempSrc:      SpO2: 97% 98% 99% 97%      Constitutional: Moderately built and nourished. Vitals:   12/08/16 2000 12/08/16 2030 12/08/16 2100 12/08/16 2130  BP: 162/70 162/76 152/79 171/74  Pulse: (!) 58 (!) 58 (!) 53 (!) 58  Resp: 18 19 20 17   Temp:      TempSrc:      SpO2: 97% 98% 99% 97%   Eyes: Anicteric no pallor. ENMT: No discharge from the ears eyes nose or mouth. Neck: No mass felt. No JVD appreciated. Respiratory: No rhonchi or crepitations. Cardiovascular: S1-S2 heard. No murmurs appreciated. Abdomen: Soft nontender bowel sounds present. No guarding or rigidity. Musculoskeletal: No edema. No joint effusion. Skin: No rash. Skin appears warm. Neurologic: Alert awake oriented to time place and person. Moves all extremities. Psychiatric: Appears normal. Normal affect.   Labs on Admission: I have personally reviewed following labs and imaging studies  CBC:  Recent Labs Lab 12/08/16 1657  WBC 5.9  HGB 15.2  HCT 44.2  MCV 87.4  PLT 123XX123   Basic Metabolic Panel:  Recent Labs Lab 12/08/16 1657  NA 138  K 4.7  CL 105  CO2 25  GLUCOSE 201*  BUN 18  CREATININE 1.08  CALCIUM 9.3   GFR: CrCl cannot be calculated (Unknown ideal weight.). Liver Function Tests: No results for input(s): AST, ALT, ALKPHOS, BILITOT, PROT, ALBUMIN in the last 168 hours. No results for input(s): LIPASE, AMYLASE in the last 168 hours. No results for input(s): AMMONIA in the last 168 hours. Coagulation Profile: No results for input(s): INR, PROTIME in the last 168 hours. Cardiac Enzymes: No results for input(s): CKTOTAL, CKMB, CKMBINDEX, TROPONINI in the last 168 hours. BNP (last 3 results) No results for input(s): PROBNP in the last 8760 hours. HbA1C: No results for  input(s): HGBA1C in the last 72 hours. CBG: No results for input(s): GLUCAP in the last 168 hours. Lipid Profile: No results for input(s): CHOL, HDL, LDLCALC, TRIG, CHOLHDL, LDLDIRECT in the last 72 hours. Thyroid Function Tests: No results for input(s): TSH, T4TOTAL, FREET4, T3FREE, THYROIDAB in the last 72 hours. Anemia Panel: No results for input(s): VITAMINB12, FOLATE, FERRITIN, TIBC, IRON, RETICCTPCT in the last 72 hours. Urine analysis:    Component Value Date/Time   COLORURINE YELLOW 12/21/2015 [place] 12/21/2015 1044   LABSPEC 1.020 12/21/2015 1044   PHURINE 6.0 12/21/2015 1044   GLUCOSEU NEGATIVE 12/21/2015  [place] 12/21/2015 [place] 12/21/2015 1044   KETONESUR NEGATIVE 12/21/2015 1044   PROTEINUR NEGATIVE 03/14/2010 1101   UROBILINOGEN 0.2 12/21/2015 1044   NITRITE NEGATIVE 12/21/2015 1044   LEUKOCYTESUR NEGATIVE 12/21/2015 1044   Sepsis Labs: @LABRCNTIP (procalcitonin:4,lacticidven:4) )No results found for this or any previous visit (from the past 240 hour(s)).   Radiological Exams on Admission: Dg Chest 2 View  Result Date: 12/08/2016 CLINICAL DATA:  Chest fluttering. EXAM: CHEST  2 VIEW COMPARISON:  Find 02/07/2016 FINDINGS: Lungs are mildly hyperinflated but stable. No pneumonic consolidation or effusion. No pneumothorax. Heart is normal in size. There is aortic atherosclerosis. There is mild dextroconvex curvature at the thoracolumbar junction. No acute osseous abnormality. IMPRESSION: No active cardiopulmonary disease.  Hyperinflated lungs. Electronically Signed   By: Ashley Royalty M.D.   On: 12/08/2016 18:43    EKG: Independently reviewed. Sinus bradycardia rate around 59 bpm. Nonspecific ECG changes.  Assessment/Plan Principal Problem:   Chest pain Active Problems:   Essential hypertension   CAD, NATIVE VESSEL   Type 2 diabetes mellitus with vascular disease ([place])    1. Chest pain with history of CAD  status post stenting - we'll cycle cardiac markers to rule out ACS. Patient is mildly bradycardic so will place holding orders for Coreg. Continue statins and aspirin. 2. Palpitation with history of SVT - check TSH. Closely monitor in telemetry. Presently bradycardic. 3. Hypertension - continue amlodipine and Cozaar and Coreg. When necessary IV hydralazine for systolic blood pressure more than 160. 4. Diabetes mellitus type 2 - placed patient on sliding scale coverage.   DVT prophylaxis: Lovenox. Code Status: Full code.  Family Communication: Discussed patient's wife.  Disposition Plan: Home.  Consults called: Cardiology consult requested.  Admission status: Observation.    Rise Patience MD [facility] Pager [phone].  If 7PM-7AM, please contact night-coverage [email or web address] Password [facility]  12/08/2016, 10:31 PM

## 2016-12-08 NOTE — ED Provider Notes (Signed)
[facility] DEPT Provider Note   CSN: [id number] Arrival date & time: 12/08/16  1641     History   Chief Complaint Chief Complaint  Patient presents with  . Chest Pain    HPI Keith Herrera is a 83 y.o. male presenting after a few days of feeling fluttering in his chest and discomfort in his left arm. He explains that he was previously diagnosed for abnormal rhythm years ago and has had 2 stents put in 5 or 6 years ago. Denies history of myocardial infarction. He hadn't had this fluttering feeling up until a few days ago and it got worse today. He explains that he has had an episode where the fluttering came on and he felt like he was going to pass out. He was told to come to the emergency department if he ever experiences feeling again. He denies any shortness of breath, chest pressure, syncope, nausea, vomiting but he does states that it seems like if he vomited he would feel better. He denies any pain or discomfort at this time.  HPI  Past Medical History:  Diagnosis Date  . Adenomatous polyp of colon 05/2004  . Allergic rhinitis   . Anxiety   . BPH (benign prostatic hyperplasia)   . CAD (coronary artery disease)    s/p Cypher DES to LAD and pD1 2010;  LHC was done 6/12: EF 65%, circumflex patent, mid RCA 80-90% (small and nondominant), LAD and diagonal stents patent, LAD at the origin of the first diagonal 30%, ostial D2 90%, mid 80-90% (small vessel).  Continued medical therapy was recommended.   . Carotid stenosis    dopplers 02/2012: XX123456 RICA; 123456 LICA  . Diabetes type 2, controlled ([place])   . Diverticulosis    colon  . ED (erectile dysfunction)   . Esophageal stricture   . GERD (gastroesophageal reflux disease)   . Hemorrhoids   . Hiatal hernia   . Hyperlipidemia   . Hypertension   . IBS (irritable bowel syndrome)   . Right inguinal hernia    s/p repair in 8/12  . SVT (supraventricular tachycardia) [facility])     Patient Active Problem List   Diagnosis Date  Noted  . Cough 02/06/2016  . Wheezing 02/06/2016  . Dizziness 01/22/2016  . Itching 12/21/2015  . Lower back pain 11/24/2015  . SVT (supraventricular tachycardia) ([place]) 01/31/2015  . Diabetes mellitus type 2, noninsulin dependent ([place]) 01/31/2015  . Carotid bruit 01/15/2012  . Preventative health care 07/01/2011  . BENIGN PROSTATIC HYPERTROPHY 06/26/2010  . CAD, NATIVE VESSEL 11/02/2008  . INGUINAL HERNIA, RIGHT 10/28/2008  . ANXIETY 10/28/2007  . ESOPHAGEAL STRICTURE 10/28/2007  . NEPHROLITHIASIS, HX OF 10/28/2007  . Hyperlipidemia 07/22/2007  . ERECTILE DYSFUNCTION 07/22/2007  . Essential hypertension 07/22/2007  . Allergic rhinitis 07/22/2007  . GERD 07/22/2007  . DIVERTICULOSIS, COLON 07/22/2007  . IBS 07/22/2007  . COLONIC POLYPS, HX OF 07/22/2007  . HEMORRHOIDS, HX OF 07/22/2007    Past Surgical History:  Procedure Laterality Date  . CORONARY STENT PLACEMENT  10/2008   x 2  . ELECTROPHYSIOLOGIC STUDY N/A 03/20/2015   no inducible SVT - Dr Lovena Le  . INGUINAL HERNIA REPAIR Right    RIH with ultrapro patch       Home Medications    Prior to Admission medications   Medication Sig Start Date End Date Taking? Authorizing Provider  amLODipine (NORVASC) 5 MG tablet Take 5 mg by mouth daily.  03/09/15  Yes Historical Provider, MD  aspirin EC  81 MG tablet Take 81 mg by mouth daily after supper.   Yes Historical Provider, MD  atorvastatin (LIPITOR) 10 MG tablet TAKE 1 TABLET EVERY DAY 06/11/16  Yes [name], MD  brimonidine [facility]) 0.2 % ophthalmic solution Place 1 drop into both eyes 3 (three) times daily. 11/29/16  Yes Historical Provider, MD  carvedilol (COREG) 6.25 MG tablet TAKE 1 TABLET (6.25 MG TOTAL) BY MOUTH 2 (TWO) TIMES DAILY WITH A MEAL. 11/07/16  Yes [name], MD  lansoprazole (PREVACID) 30 MG capsule TAKE 1 CAPSULE (30 MG TOTAL) BY MOUTH DAILY AT 12 NOON. 07/15/16  Yes [name], MD  latanoprost (XALATAN) 0.005 % ophthalmic solution Place 1 drop into  both eyes at bedtime. 11/20/16  Yes Historical Provider, MD  losartan (COZAAR) 100 MG tablet TAKE 1 TABLET EVERY DAY 03/25/16  Yes [name], MD  metFORMIN (GLUCOPHAGE) 500 MG tablet Take 500 mg by mouth 2 (two) times daily with a meal. Take 2 tabs by mouth in the am & 1 tab by mouth in the pm    Yes Historical Provider, MD    Family History Family History  Problem Relation Age of Onset  . Bladder Cancer Mother 11  . Heart attack Father 51    Deceased  . Pancreatic cancer Sister 43  . Healthy Son   . Healthy Daughter     Social History Social History  Substance Use Topics  . Smoking status: Former Smoker    Types: Cigarettes    Quit date: 03/05/1956  . Smokeless tobacco: Never Used     Comment: smoked in teens  . Alcohol use No     Allergies   Crestor [rosuvastatin calcium] and Lovastatin   Review of Systems Review of Systems  Constitutional: Negative for chills and fever.  HENT: Negative for congestion, ear pain and sore throat.   Eyes: Negative for pain and visual disturbance.  Respiratory: Negative for cough, chest tightness, shortness of breath, wheezing and stridor.   Cardiovascular: Positive for palpitations. Negative for chest pain and leg swelling.  Gastrointestinal: Negative for abdominal distention, abdominal pain, nausea and vomiting.  Genitourinary: Negative for dysuria and hematuria.  Musculoskeletal: Negative for arthralgias, back pain, neck pain and neck stiffness.  Skin: Negative for color change, pallor and rash.  Neurological: Negative for dizziness, seizures, syncope and light-headedness.       Patient has had an episode where he felt like he was going to pass out  All other systems reviewed and are negative.    Physical Exam Updated Vital Signs BP 162/76   Pulse (!) 58   Temp 98 F (36.7 C) (Oral)   Resp 19   SpO2 98%   Physical Exam  Constitutional: He appears well-developed and well-nourished. No distress.  Afebrile, nontoxic appearing  pleasant gentleman sitting comfortably in bed in no acute distress.  HENT:  Head: Normocephalic and atraumatic.  Mouth/Throat: Oropharynx is clear and moist. No oropharyngeal exudate.  Eyes: Conjunctivae and EOM are normal. Pupils are equal, round, and reactive to light. Right eye exhibits no discharge. Left eye exhibits no discharge.  Neck: Normal range of motion. Neck supple.  Cardiovascular: Normal rate, regular rhythm and normal heart sounds.   No murmur heard. Pulmonary/Chest: Effort normal and breath sounds normal. No respiratory distress. He has no wheezes. He has no rales. He exhibits no tenderness.  Abdominal: Soft. He exhibits no distension. There is no tenderness.  Musculoskeletal: He exhibits no edema.  Neurological: He is  alert.  Skin: Skin is warm and dry. He is not diaphoretic.  Psychiatric: He has a normal mood and affect. His behavior is normal.  Nursing note and vitals reviewed.    ED Treatments / Results  Labs (all labs ordered are listed, but only abnormal results are displayed) Labs Reviewed  BASIC METABOLIC PANEL - Abnormal; Notable for the following:       Result Value   Glucose, Bld 201 (*)    All other components within normal limits  CBC  I-STAT TROPOININ, ED    EKG  EKG Interpretation  Date/Time:  Sunday December 08 2016 16:51:54 EST Ventricular Rate:  59 PR Interval:  138 QRS Duration: 82 QT Interval:  412 QTC Calculation: 407 R Axis:   72 Text Interpretation:  Sinus bradycardia Otherwise normal ECG Since last EKG, no significant changes Confirmed by ISAACS MD, CAMERON ([id number]) on 12/08/2016 7:35:41 PM       Radiology Dg Chest 2 View  Result Date: 12/08/2016 CLINICAL DATA:  Chest fluttering. EXAM: CHEST  2 VIEW COMPARISON:  Find 02/07/2016 FINDINGS: Lungs are mildly hyperinflated but stable. No pneumonic consolidation or effusion. No pneumothorax. Heart is normal in size. There is aortic atherosclerosis. There is mild dextroconvex curvature at  the thoracolumbar junction. No acute osseous abnormality. IMPRESSION: No active cardiopulmonary disease.  Hyperinflated lungs. Electronically Signed   By: David  Kwon M.D.   On: 12/08/2016 18:43    Procedures Procedures (including critical care time)  Medications Ordered in ED Medications - No data to display   Initial Impression / Assessment and Plan / ED Course  I have reviewed the triage vital signs and the nursing notes.  Pertinent labs & imaging results that were available during my care of the patient were reviewed by me and considered in my medical decision making (see chart for details).     84  year old male  With known CAD s/p stent placement in 2010, carotid stenosis and paroxysmal SVT presenting with chest fluttering episodes and feelings of impending syncope. EKG is unchanged from previous tracing one year ago. Sinus bradycardia. Labs unremarkable, initial troponin negative.  CXR without acute cardiopulmonary process.  Last echo in March 2016   Heart score:  6 Consult to cardiology and patient will be admitted for ACS rule out and stress test.  Patient was discussed with Dr. Ellender Hose who also has seen patient and agrees with assessment and plan. He spoke with cardiology and admitting physician regarding this patient.   Final Clinical Impressions(s) / ED Diagnoses   Final diagnoses:  Chest pain, unspecified type    New Prescriptions New Prescriptions   No medications on file     Dossie Der 12/08/16 2105    Duffy Bruce, MD 12/09/16 [phone]

## 2016-12-09 ENCOUNTER — Observation Stay (HOSPITAL_BASED_OUTPATIENT_CLINIC_OR_DEPARTMENT_OTHER): Payer: Medicare HMO

## 2016-12-09 ENCOUNTER — Observation Stay (HOSPITAL_COMMUNITY): Payer: Medicare HMO

## 2016-12-09 DIAGNOSIS — I200 Unstable angina: Secondary | ICD-10-CM

## 2016-12-09 DIAGNOSIS — R079 Chest pain, unspecified: Secondary | ICD-10-CM

## 2016-12-09 DIAGNOSIS — R002 Palpitations: Secondary | ICD-10-CM | POA: Diagnosis not present

## 2016-12-09 DIAGNOSIS — R9439 Abnormal result of other cardiovascular function study: Secondary | ICD-10-CM | POA: Diagnosis not present

## 2016-12-09 DIAGNOSIS — I208 Other forms of angina pectoris: Secondary | ICD-10-CM | POA: Diagnosis not present

## 2016-12-09 DIAGNOSIS — E1159 Type 2 diabetes mellitus with other circulatory complications: Secondary | ICD-10-CM

## 2016-12-09 DIAGNOSIS — I10 Essential (primary) hypertension: Secondary | ICD-10-CM | POA: Diagnosis not present

## 2016-12-09 LAB — ECHOCARDIOGRAM COMPLETE
Height: 71 in
Weight: 2504 oz

## 2016-12-09 LAB — GLUCOSE, CAPILLARY
GLUCOSE-CAPILLARY: 222 mg/dL — AB (ref 65–99)
GLUCOSE-CAPILLARY: 206 mg/dL — AB (ref 65–99)
Glucose-Capillary: 143 mg/dL — ABNORMAL HIGH (ref 65–99)

## 2016-12-09 LAB — TROPONIN I
Troponin I: <0.03 ng/mL (ref <0.03)
Troponin I: <0.03 ng/mL (ref <0.03)

## 2016-12-09 LAB — NM MYOCAR MULTI W/SPECT W/WALL MOTION / EF
CSEPPHR: 111 {beats}/min
Rest HR: 62 {beats}/min

## 2016-12-09 MED ORDER — SODIUM CHLORIDE 0.9 % WEIGHT BASED INFUSION
3.0000 mL/kg/h | INTRAVENOUS | Status: DC
  Administered 2016-12-10: 3 mL/kg/h via INTRAVENOUS

## 2016-12-09 MED ORDER — ISOSORBIDE DINITRATE 10 MG PO TABS
10.0000 mg | ORAL_TABLET | Freq: Three times a day (TID) | ORAL | Status: DC
  Administered 2016-12-09 – 2016-12-11 (×7): 10 mg via ORAL
  Filled 2016-12-09 (×7): qty 1

## 2016-12-09 MED ORDER — TECHNETIUM TC 99M TETROFOSMIN IV KIT
30.0000 | PACK | Freq: Once | INTRAVENOUS | Status: AC | PRN
  Administered 2016-12-09: 30 via INTRAVENOUS

## 2016-12-09 MED ORDER — SODIUM CHLORIDE 0.9 % IV SOLN: 250.0000 mL | INTRAVENOUS | Status: DC | PRN

## 2016-12-09 MED ORDER — SODIUM CHLORIDE 0.9 % WEIGHT BASED INFUSION
1.0000 mL/kg/h | INTRAVENOUS | Status: DC
  Administered 2016-12-10: 1 mL/kg/h via INTRAVENOUS

## 2016-12-09 MED ORDER — REGADENOSON 0.4 MG/5ML IV SOLN
0.4000 mg | Freq: Once | INTRAVENOUS | Status: AC
  Administered 2016-12-09: 0.4 mg via INTRAVENOUS
  Filled 2016-12-09: qty 5

## 2016-12-09 MED ORDER — REGADENOSON 0.4 MG/5ML IV SOLN
0.4000 mg | Freq: Once | INTRAVENOUS | Status: DC
  Filled 2016-12-09: qty 5

## 2016-12-09 MED ORDER — SODIUM CHLORIDE 0.9% FLUSH
3.0000 mL | Freq: Two times a day (BID) | INTRAVENOUS | Status: DC
  Administered 2016-12-09: 3 mL via INTRAVENOUS

## 2016-12-09 MED ORDER — SODIUM CHLORIDE 0.9% FLUSH: 3.0000 mL | INTRAVENOUS | Status: DC | PRN

## 2016-12-09 MED ORDER — TECHNETIUM TC 99M TETROFOSMIN IV KIT
10.0000 | PACK | Freq: Once | INTRAVENOUS | Status: AC | PRN
  Administered 2016-12-09: 10 via INTRAVENOUS

## 2016-12-09 MED ORDER — REGADENOSON 0.4 MG/5ML IV SOLN
INTRAVENOUS | Status: AC
  Filled 2016-12-09: qty 5

## 2016-12-09 MED ORDER — AMLODIPINE BESYLATE 10 MG PO TABS
10.0000 mg | ORAL_TABLET | Freq: Every day | ORAL | Status: DC
  Administered 2016-12-09 – 2016-12-11 (×3): 10 mg via ORAL
  Filled 2016-12-09 (×3): qty 1

## 2016-12-09 MED ORDER — ASPIRIN 81 MG PO CHEW
81.0000 mg | CHEWABLE_TABLET | ORAL | Status: AC
  Administered 2016-12-10: 81 mg via ORAL
  Filled 2016-12-09: qty 1

## 2016-12-09 MED ORDER — HYDRALAZINE HCL 20 MG/ML IJ SOLN
5.0000 mg | INTRAMUSCULAR | Status: DC | PRN
  Administered 2016-12-09: 5 mg via INTRAVENOUS
  Filled 2016-12-09: qty 1

## 2016-12-09 NOTE — Progress Notes (Signed)
  Echocardiogram 2D Echocardiogram has been performed.  Jennette Dubin 12/09/2016, 1:11 PM

## 2016-12-09 NOTE — Progress Notes (Signed)
   Stress test intermediate, will plan for cath tomorrow. NPO after midnight.   I have reviewed the risks, indications, and alternatives to cardiac catheterization and possible angioplasty/stenting with the patient. Risks include but are not limited to bleeding, infection, vascular injury, stroke, myocardial infection, arrhythmia, kidney injury, radiation-related injury in the case of prolonged fluoroscopy use, emergency cardiac surgery, and death. The patient understands the risks of serious complication is low (123456).   Angelena Form PA-C  [facility]

## 2016-12-09 NOTE — Care Management Obs Status (Signed)
[facility] NOTIFICATION   Patient Details  Name: Keith Herrera MRN: [id number] Date of Birth: 09/06/1932   Medicare Observation Status Notification Given:  Yes    Bethena Roys, RN 12/09/2016, 4:42 PM

## 2016-12-09 NOTE — Progress Notes (Signed)
Patient SBP now in 180s, HR 50s sustained. Patient states he has a "mild" head ache. PO tylenol given. MD Hal Hope made aware, new orders received. Will continue to monitor.

## 2016-12-09 NOTE — Progress Notes (Signed)
PROGRESS NOTE    DANNIS MALLICK  [id number] DOB: 10-19-1932 DOA: 12/08/2016 PCP: Cathlean Cower, MD   Outpatient Specialists:     Brief Narrative:   Keith Herrera is a 83 y.o. male with history of CAD status post stenting, history of SVT, hypertension and diabetes mellitus presents to the ER because of chest pain. Patient started experiencing chest pain last evening while at home. Chest pain lasted for around 10-15 minutes along with palpitations. Chest pain resolved without any intervention. Chest pain was pressure-like radiating to left arm. No associated shortness of breath or productive cough. In the ER EKG was showing sinus bradycardia troponins were negative chest x-ray was unremarkable. ER physician did discuss with Dr. Radford Pax cardiologist who recommended admission to further rule out ACS. At the time of my exam patient is asymptomatic.    Assessment & Plan:   Principal Problem:   Chest pain Active Problems:   Essential hypertension   CAD, NATIVE VESSEL   Type 2 diabetes mellitus with vascular disease ([place])   1. Chest pain with history of CAD status post stenting - -intermediate risk ST -cath rec by cardiology -echo read pending 2. Palpitation with history of SVT -  TSH 11 months ago normal. Closely monitor in telemetr 3. Hypertension - continue amlodipine and Cozaar and Coreg.  4. Diabetes mellitus type 2 - placed patient on sliding scale coverage.   DVT prophylaxis:  lovenox  Code Status: Full Code   Family Communication:   Disposition Plan:     Consultants:   cardiology   Subjective: No further chest pain Would like to avoid a cath  Objective: Vitals:   12/09/16 0926 12/09/16 0958 12/09/16 1000 12/09/16 1100  BP: (!) 157/74 (!) 169/75 (!) 178/77 (!) 164/72  Pulse:    76  Resp:    18  Temp:    97.6 F (36.4 C)  TempSrc:    Oral  SpO2:    98%  Weight:      Height:        Intake/Output Summary (Last 24 hours) at 12/09/16 1503 Last  data filed at 12/09/16 0600  Gross per 24 hour  Intake                0 ml  Output              400 ml  Net             -400 ml   Filed Weights   12/08/16 2300 12/09/16 0509  Weight: 73.1 kg (161 lb 1.6 oz) 71 kg (156 lb 8 oz)    Examination:  General exam: Appears calm and comfortable  Respiratory system: Clear to auscultation. Respiratory effort normal. Cardiovascular system: S1 & S2 heard, RRR. No JVD, murmurs, rubs, gallops or clicks. No pedal edema. Gastrointestinal system: Abdomen is nondistended, soft and nontender. No organomegaly or masses felt. Normal bowel sounds heard. Central nervous system: Alert and oriented. No focal neurological deficits.     Data Reviewed: I have personally reviewed following labs and imaging studies  CBC:  Recent Labs Lab 12/08/16 1657  WBC 5.9  HGB 15.2  HCT 44.2  MCV 87.4  PLT 123XX123   Basic Metabolic Panel:  Recent Labs Lab 12/08/16 1657  NA 138  K 4.7  CL 105  CO2 25  GLUCOSE 201*  BUN 18  CREATININE 1.08  CALCIUM 9.3   GFR: Estimated Creatinine Clearance: 53 mL/min (by C-G formula based on SCr of 1.08 mg/dL).  Liver Function Tests: No results for input(s): AST, ALT, ALKPHOS, BILITOT, PROT, ALBUMIN in the last 168 hours. No results for input(s): LIPASE, AMYLASE in the last 168 hours. No results for input(s): AMMONIA in the last 168 hours. Coagulation Profile: No results for input(s): INR, PROTIME in the last 168 hours. Cardiac Enzymes:  Recent Labs Lab 12/08/16 2248 12/09/16 0412 12/09/16 1336  TROPONINI <0.03 <0.03 <0.03   BNP (last 3 results) No results for input(s): PROBNP in the last 8760 hours. HbA1C: No results for input(s): HGBA1C in the last 72 hours. CBG:  Recent Labs Lab 12/09/16 1135  GLUCAP 222*   Lipid Profile: No results for input(s): CHOL, HDL, LDLCALC, TRIG, CHOLHDL, LDLDIRECT in the last 72 hours. Thyroid Function Tests: No results for input(s): TSH, T4TOTAL, FREET4, T3FREE, THYROIDAB  in the last 72 hours. Anemia Panel: No results for input(s): VITAMINB12, FOLATE, FERRITIN, TIBC, IRON, RETICCTPCT in the last 72 hours. Urine analysis:    Component Value Date/Time   COLORURINE YELLOW 12/21/2015 Bartholomew 12/21/2015 1044   LABSPEC 1.020 12/21/2015 1044   PHURINE 6.0 12/21/2015 1044   GLUCOSEU NEGATIVE 12/21/2015 1044   HGBUR NEGATIVE 12/21/2015 1044   BILIRUBINUR NEGATIVE 12/21/2015 1044   KETONESUR NEGATIVE 12/21/2015 1044   PROTEINUR NEGATIVE 03/14/2010 1101   UROBILINOGEN 0.2 12/21/2015 1044   NITRITE NEGATIVE 12/21/2015 1044   LEUKOCYTESUR NEGATIVE 12/21/2015 1044    )No results found for this or any previous visit (from the past 240 hour(s)).    Anti-infectives    None       Radiology Studies: Dg Chest 2 View  Result Date: 12/08/2016 CLINICAL DATA:  Chest fluttering. EXAM: CHEST  2 VIEW COMPARISON:  Find 02/07/2016 FINDINGS: Lungs are mildly hyperinflated but stable. No pneumonic consolidation or effusion. No pneumothorax. Heart is normal in size. There is aortic atherosclerosis. There is mild dextroconvex curvature at the thoracolumbar junction. No acute osseous abnormality. IMPRESSION: No active cardiopulmonary disease.  Hyperinflated lungs. Electronically Signed   By: Ashley Royalty M.D.   On: 12/08/2016 18:43   Nm Myocar Multi W/spect W/wall Motion / Ef  Result Date: 12/09/2016 CLINICAL DATA:  Chest pain, coronary artery disease EXAM: MYOCARDIAL IMAGING WITH SPECT (REST AND PHARMACOLOGIC-STRESS) GATED LEFT VENTRICULAR WALL MOTION STUDY LEFT VENTRICULAR EJECTION FRACTION TECHNIQUE: Standard myocardial SPECT imaging was performed after resting intravenous injection of 10 mCi Tc-49m tetrofosmin. Subsequently, intravenous infusion of Lexiscan was performed under the supervision of the Cardiology staff. At peak effect of the drug, 30 mCi Tc-9m tetrofosmin was injected intravenously and standard myocardial SPECT imaging was performed. Quantitative  gated imaging was also performed to evaluate left ventricular wall motion, and estimate left ventricular ejection fraction. COMPARISON:  None. FINDINGS: Perfusion: There is decreased activity in the inferior wall on both rest and stress images. This area moves and thickens normally suggesting soft tissue attenuation. However, more pronounced decreased activity is noted in the inferior wall and adjacent lateral wall on stress imaging concerning for inducible ischemia. Wall Motion: Normal left ventricular wall motion. No left ventricular dilation. Left Ventricular Ejection Fraction: 66 % End diastolic volume 56 ml End systolic volume 19 ml IMPRESSION: 1. Findings concerning for inducible ischemia in the inferolateral wall. 2. Normal left ventricular wall motion. 3. Left ventricular ejection fraction 66% 4. Non invasive risk stratification*: Intermediate *2012 Appropriate Use Criteria for Coronary Revascularization Focused Update: J Am Coll Cardiol. [id number]. [email or web address] Electronically Signed   By: Rolm Baptise M.D.   On: 12/09/2016 12:22  Scheduled Meds: . regadenoson      . amLODipine  10 mg Oral Daily  . aspirin EC  81 mg Oral QPC supper  . atorvastatin  10 mg Oral q1800  . brimonidine  1 drop Both Eyes TID  . carvedilol  6.25 mg Oral BID WC  . enoxaparin (LOVENOX) injection  40 mg Subcutaneous Q24H  . insulin aspart  0-9 Units Subcutaneous TID WC  . isosorbide dinitrate  10 mg Oral TID  . latanoprost  1 drop Both Eyes QHS  . losartan  100 mg Oral Daily  . pantoprazole  40 mg Oral Daily  . regadenoson  0.4 mg Intravenous Once   Continuous Infusions:   LOS: 0 days    Time spent: 25 min    [place], DO [facility] Pager [phone]  If 7PM-7AM, please contact night-coverage [email or web address] Password [facility] 12/09/2016, 3:03 PM

## 2016-12-09 NOTE — Progress Notes (Signed)
Patient transferred from ED via stretcher with 2 EMTs. Patient awake, alert and oriented. Able to ambulate from stretcher to bed with no issues. Complains of no pain, no dizziness or shortness of breath. All VSS at this time. Wife at bedside, plans to stay the night. Both patient and wife oriented to unit and call bell. Will continue to monitor.

## 2016-12-09 NOTE — Consult Note (Signed)
CARDIOLOGY CONSULT NOTE       Patient ID: Keith Herrera MRN: [id number] DOB/AGE: 1932/06/08 83 y.o.  Admit date: 12/08/2016 Referring Physician: Eliseo Squires Primary Physician: Cathlean Cower, MD Primary Cardiologist: Cooper/Taylor Reason for Consultation: Chest Pain  Principal Problem:   Chest pain Active Problems:   Essential hypertension   CAD, NATIVE VESSEL   Type 2 diabetes mellitus with vascular disease ([place])   HPI:  83 y.o. with known CAD. Previous stenting of LAD and Diagonal with residual small vessel disease in  Diagonals and non dominant RCA by cath in 2012 Last non ischemic myovue in August of 2015. Also history  Of SVT with inability to induce on EP study 2016.  Yesterday had palpitations. "I think it was fast" then had SSCP Radiated to left arm. Headache and BP has been elevated. Pain lasted about 20 mintues. Did not take nitro In ER rhythm stable r/o no acute ECG changes feels well this am.   Denies focal neuro deficits headache gone. Compliant with meds BP up this am Tends to be bradycardic No dyspnea Telemetry with sinus arrhythmia short PR but no SVT  Still active and drives a tour bus   ROS All other systems reviewed and negative except as noted above  Past Medical History:  Diagnosis Date  . Adenomatous polyp of colon 05/2004  . Allergic rhinitis   . Anxiety   . BPH (benign prostatic hyperplasia)   . CAD (coronary artery disease)    s/p Cypher DES to LAD and pD1 2010;  LHC was done 6/12: EF 65%, circumflex patent, mid RCA 80-90% (small and nondominant), LAD and diagonal stents patent, LAD at the origin of the first diagonal 30%, ostial D2 90%, mid 80-90% (small vessel).  Continued medical therapy was recommended.   . Carotid stenosis    dopplers 02/2012: [id number] RICA; 123456 LICA  . Diabetes type 2, controlled ([place])   . Diverticulosis    colon  . ED (erectile dysfunction)   . Esophageal stricture   . GERD (gastroesophageal reflux disease)   . Hemorrhoids     . Hiatal hernia   . Hyperlipidemia   . Hypertension   . IBS (irritable bowel syndrome)   . Right inguinal hernia    s/p repair in 8/12  . SVT (supraventricular tachycardia) ([facility])     Family History  Problem Relation Age of Onset  . Bladder Cancer Mother 58  . Heart attack Father 90    Deceased  . Pancreatic cancer Sister 50  . Healthy Son   . Healthy Daughter     Social History   Social History  . Marital status: Married    Spouse name: N/A  . Number of children: 3  . Years of education: N/A   Occupational History  . 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 Smoking status: Former Smoker    Types: Cigarettes    Quit date: 03/05/1956  . Smokeless tobacco: Never Used     Comment: smoked in teens  . Alcohol use No  . Drug use: No  . Sexual activity: Not on file   Other Topics Concern  . Not on file   Social History Narrative   Lives with wife in a 3 story home.  Has 2 living children.  1 passed away in a car accident.     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  Past Surgical History:  Procedure Laterality Date  . CORONARY STENT PLACEMENT  10/2008   x 2  . ELECTROPHYSIOLOGIC STUDY N/A 03/20/2015   no inducible SVT - Dr Lovena Le  . INGUINAL HERNIA REPAIR Right    RIH with ultrapro patch     . amLODipine  5 mg Oral Daily  . aspirin EC  81 mg Oral QPC supper  . atorvastatin  10 mg Oral q1800  . brimonidine  1 drop Both Eyes TID  . carvedilol  6.25 mg Oral BID WC  . enoxaparin (LOVENOX) injection  40 mg Subcutaneous Q24H  . insulin aspart  0-9 Units Subcutaneous TID WC  . latanoprost  1 drop Both Eyes QHS  . losartan  100 mg Oral Daily  . pantoprazole  40 mg Oral Daily     Physical Exam: Blood pressure (!) 183/84, pulse 68, temperature 97.8 F (36.6 C), temperature source Oral, resp. rate 14, height 5\' 11"  (1.803 m), weight 156 lb 8 oz (71 kg), SpO2 95 %.     Affect appropriate Healthy:  appears stated age 38: normal Neck supple with no adenopathy JVP normal no bruits no thyromegaly Lungs clear with no wheezing and good diaphragmatic motion Heart:  S1/S2 no murmur, no rub, gallop or click PMI normal Abdomen: benighn, BS positve, no tenderness, no AAA no bruit.  No HSM or HJR Distal pulses intact with no bruits No edema Neuro non-focal Skin warm and dry No muscular weakness   Labs:   Lab Results  Component Value Date   WBC 5.9 12/08/2016   HGB 15.2 12/08/2016   HCT 44.2 12/08/2016   MCV 87.4 12/08/2016   PLT 169 12/08/2016     Recent Labs Lab 12/08/16 1657  NA 138  K 4.7  CL 105  CO2 25  BUN 18  CREATININE 1.08  CALCIUM 9.3  GLUCOSE 201*   Lab Results  Component Value Date   CKTOTAL 73 07/10/2011   CKMB 3.2 07/10/2011   TROPONINI <0.03 12/09/2016    Lab Results  Component Value Date   CHOL 117 12/21/2015   CHOL 131 06/14/2015   CHOL 149 12/13/2014   Lab Results  Component Value Date   HDL 33.60 (L) 12/21/2015   HDL 31.60 (L) 06/14/2015   HDL 32.80 (L) 12/13/2014   Lab Results  Component Value Date   LDLCALC 63 12/21/2015   LDLCALC 69 06/14/2015   LDLCALC 79 12/21/2012   Lab Results  Component Value Date   TRIG 102.0 12/21/2015   TRIG 154.0 (H) 06/14/2015   TRIG 205.0 (H) 12/13/2014   Lab Results  Component Value Date   CHOLHDL 3 12/21/2015   CHOLHDL 4 06/14/2015   CHOLHDL 5 12/13/2014   Lab Results  Component Value Date   LDLDIRECT 102.0 12/13/2014   LDLDIRECT 96.7 09/21/2013   LDLDIRECT 115.7 03/31/2008      Radiology: Dg Chest 2 View  Result Date: 12/08/2016 CLINICAL DATA:  Chest fluttering. EXAM: CHEST  2 VIEW COMPARISON:  Find 02/07/2016 FINDINGS: Lungs are mildly hyperinflated but stable. No pneumonic consolidation or effusion. No pneumothorax. Heart is normal in size. There is aortic atherosclerosis. There is mild dextroconvex curvature at the thoracolumbar junction. No  acute osseous abnormality. IMPRESSION: No active cardiopulmonary disease.  Hyperinflated lungs. Electronically Signed   By: Ashley Royalty M.D.   On: 12/08/2016 18:43    EKG:  SR short PR no acute ST changes nonspecific inferolateral ST segments unchanged from 2017    ASSESSMENT AND PLAN:  Chest Pain: r/o pain free this am Given known residual small vessel disease favor advancing medical Rx and risk Stratifying with myovue Add nitrates and increase norvasc  HTN:  Add nitrates increase norvasc  Palpitations: continue low dose beta blocker telemetry to r/o SVT   Signed: Jenkins Rouge 12/09/2016, 7:56 AM

## 2016-12-09 NOTE — Progress Notes (Signed)
     The patient was seen in nuclear medicine for a lexiscan myoview. He tolerated the procedure well. No acute ST or TW changes on ECG. Await nuclear images.    Shatonya Passon Stern PA-C  [facility]    

## 2016-12-09 NOTE — Progress Notes (Signed)
Paged NP Schorr in regards to patient's  HR, bradycardic in 50's to 60's with several pauses noted on telemetry each lasting between 1.87-2.0 seconds, patient is assymptomatic at this time and all other vitals stable. No new orders, will continue to monitor.

## 2016-12-10 ENCOUNTER — Encounter (HOSPITAL_COMMUNITY)
Admission: EM | Disposition: A | Discharge status: Home / Self Care | Payer: Self-pay | Source: Home / Self Care | Attending: Internal Medicine

## 2016-12-10 DIAGNOSIS — R002 Palpitations: Secondary | ICD-10-CM

## 2016-12-10 DIAGNOSIS — I208 Other forms of angina pectoris: Secondary | ICD-10-CM

## 2016-12-10 DIAGNOSIS — R001 Bradycardia, unspecified: Secondary | ICD-10-CM | POA: Diagnosis present

## 2016-12-10 DIAGNOSIS — R079 Chest pain, unspecified: Secondary | ICD-10-CM | POA: Diagnosis present

## 2016-12-10 DIAGNOSIS — E1159 Type 2 diabetes mellitus with other circulatory complications: Secondary | ICD-10-CM | POA: Diagnosis not present

## 2016-12-10 DIAGNOSIS — I10 Essential (primary) hypertension: Secondary | ICD-10-CM | POA: Diagnosis present

## 2016-12-10 DIAGNOSIS — Z7984 Long term (current) use of oral hypoglycemic drugs: Secondary | ICD-10-CM | POA: Diagnosis not present

## 2016-12-10 DIAGNOSIS — E785 Hyperlipidemia, unspecified: Secondary | ICD-10-CM | POA: Diagnosis present

## 2016-12-10 DIAGNOSIS — Z955 Presence of coronary angioplasty implant and graft: Secondary | ICD-10-CM | POA: Diagnosis not present

## 2016-12-10 DIAGNOSIS — Z79899 Other long term (current) drug therapy: Secondary | ICD-10-CM | POA: Diagnosis not present

## 2016-12-10 DIAGNOSIS — Z800 Family history of malignant neoplasm of digestive organs: Secondary | ICD-10-CM | POA: Diagnosis not present

## 2016-12-10 DIAGNOSIS — Z888 Allergy status to other drugs, medicaments and biological substances status: Secondary | ICD-10-CM | POA: Diagnosis not present

## 2016-12-10 DIAGNOSIS — I200 Unstable angina: Secondary | ICD-10-CM

## 2016-12-10 DIAGNOSIS — Z8052 Family history of malignant neoplasm of bladder: Secondary | ICD-10-CM | POA: Diagnosis not present

## 2016-12-10 DIAGNOSIS — I25110 Atherosclerotic heart disease of native coronary artery with unstable angina pectoris: Secondary | ICD-10-CM | POA: Diagnosis present

## 2016-12-10 DIAGNOSIS — R9439 Abnormal result of other cardiovascular function study: Secondary | ICD-10-CM | POA: Diagnosis not present

## 2016-12-10 DIAGNOSIS — Z87891 Personal history of nicotine dependence: Secondary | ICD-10-CM | POA: Diagnosis not present

## 2016-12-10 DIAGNOSIS — Z8249 Family history of ischemic heart disease and other diseases of the circulatory system: Secondary | ICD-10-CM | POA: Diagnosis not present

## 2016-12-10 DIAGNOSIS — E1151 Type 2 diabetes mellitus with diabetic peripheral angiopathy without gangrene: Secondary | ICD-10-CM | POA: Diagnosis present

## 2016-12-10 DIAGNOSIS — K219 Gastro-esophageal reflux disease without esophagitis: Secondary | ICD-10-CM | POA: Diagnosis present

## 2016-12-10 DIAGNOSIS — Z7982 Long term (current) use of aspirin: Secondary | ICD-10-CM | POA: Diagnosis not present

## 2016-12-10 HISTORY — PX: CARDIAC CATHETERIZATION: SHX172

## 2016-12-10 LAB — GLUCOSE, CAPILLARY
Glucose-Capillary: 160 mg/dL — ABNORMAL HIGH (ref 65–99)
Glucose-Capillary: 126 mg/dL — ABNORMAL HIGH (ref 65–99)
Glucose-Capillary: 123 mg/dL — ABNORMAL HIGH (ref 65–99)
Glucose-Capillary: 199 mg/dL — ABNORMAL HIGH (ref 65–99)

## 2016-12-10 LAB — PROTIME-INR
INR: 1.15
PROTHROMBIN TIME: 14.7 s (ref 11.4–15.2)

## 2016-12-10 LAB — POCT ACTIVATED CLOTTING TIME: ACTIVATED CLOTTING TIME: 494 s

## 2016-12-10 SURGERY — LEFT HEART CATH AND CORONARY ANGIOGRAPHY

## 2016-12-10 MED ORDER — ENOXAPARIN SODIUM 40 MG/0.4ML ~~LOC~~ SOLN
40.0000 mg | SUBCUTANEOUS | Status: DC
  Filled 2016-12-10: qty 0.4

## 2016-12-10 MED ORDER — CLOPIDOGREL BISULFATE 75 MG PO TABS
75.0000 mg | ORAL_TABLET | Freq: Every day | ORAL | Status: DC
  Administered 2016-12-11: 08:00:00 75 mg via ORAL
  Filled 2016-12-10: qty 1

## 2016-12-10 MED ORDER — FENTANYL CITRATE (PF) 100 MCG/2ML IJ SOLN
INTRAMUSCULAR | Status: DC | PRN
  Administered 2016-12-10: 25 ug via INTRAVENOUS

## 2016-12-10 MED ORDER — ADENOSINE 12 MG/4ML IV SOLN
INTRAVENOUS | Status: AC
  Filled 2016-12-10: qty 16

## 2016-12-10 MED ORDER — SODIUM CHLORIDE 0.9% FLUSH: 3.0000 mL | INTRAVENOUS | Status: DC | PRN

## 2016-12-10 MED ORDER — VERAPAMIL HCL 2.5 MG/ML IV SOLN
INTRAVENOUS | Status: AC
  Filled 2016-12-10: qty 2

## 2016-12-10 MED ORDER — ASPIRIN 81 MG PO CHEW: 81.0000 mg | CHEWABLE_TABLET | Freq: Every day | ORAL | Status: DC

## 2016-12-10 MED ORDER — MIDAZOLAM HCL 2 MG/2ML IJ SOLN
INTRAMUSCULAR | Status: DC | PRN
  Administered 2016-12-10: 1 mg via INTRAVENOUS

## 2016-12-10 MED ORDER — IOPAMIDOL (ISOVUE-370) INJECTION 76%
INTRAVENOUS | Status: AC
  Filled 2016-12-10: qty 100

## 2016-12-10 MED ORDER — HEPARIN SODIUM (PORCINE) 1000 UNIT/ML IJ SOLN
INTRAMUSCULAR | Status: DC | PRN
  Administered 2016-12-10: 3500 [IU] via INTRAVENOUS

## 2016-12-10 MED ORDER — LIDOCAINE HCL (PF) 1 % IJ SOLN
INTRAMUSCULAR | Status: AC
  Filled 2016-12-10: qty 30

## 2016-12-10 MED ORDER — CLOPIDOGREL BISULFATE 300 MG PO TABS
ORAL_TABLET | ORAL | Status: AC
  Filled 2016-12-10: qty 2

## 2016-12-10 MED ORDER — VERAPAMIL HCL 2.5 MG/ML IV SOLN
INTRAVENOUS | Status: DC | PRN
  Administered 2016-12-10: 10 mL via INTRA_ARTERIAL

## 2016-12-10 MED ORDER — SODIUM CHLORIDE 0.9 % IV SOLN
INTRAVENOUS | Status: DC | PRN
  Administered 2016-12-10: 1.75 mg/kg/h via INTRAVENOUS

## 2016-12-10 MED ORDER — LIDOCAINE HCL (PF) 1 % IJ SOLN
INTRAMUSCULAR | Status: DC | PRN
  Administered 2016-12-10: 3 mL via SUBCUTANEOUS

## 2016-12-10 MED ORDER — BIVALIRUDIN 250 MG IV SOLR
INTRAVENOUS | Status: AC
  Filled 2016-12-10: qty 250

## 2016-12-10 MED ORDER — SODIUM CHLORIDE 0.9 % IV SOLN
1.7500 mg/kg/h | INTRAVENOUS | Status: AC
  Filled 2016-12-10: qty 250

## 2016-12-10 MED ORDER — CLOPIDOGREL BISULFATE 300 MG PO TABS
ORAL_TABLET | ORAL | Status: DC | PRN
  Administered 2016-12-10: 600 mg via ORAL

## 2016-12-10 MED ORDER — IOPAMIDOL (ISOVUE-370) INJECTION 76%
INTRAVENOUS | Status: DC | PRN
  Administered 2016-12-10: 90 mL via INTRA_ARTERIAL

## 2016-12-10 MED ORDER — HEPARIN (PORCINE) IN NACL 2-0.9 UNIT/ML-% IJ SOLN
INTRAMUSCULAR | Status: DC | PRN
  Administered 2016-12-10: 1000 mL

## 2016-12-10 MED ORDER — HEPARIN SODIUM (PORCINE) 1000 UNIT/ML IJ SOLN
INTRAMUSCULAR | Status: AC
  Filled 2016-12-10: qty 1

## 2016-12-10 MED ORDER — ACETAMINOPHEN 325 MG PO TABS: 650.0000 mg | ORAL_TABLET | ORAL | Status: DC | PRN

## 2016-12-10 MED ORDER — LABETALOL HCL 5 MG/ML IV SOLN: 10.0000 mg | INTRAVENOUS | Status: AC | PRN

## 2016-12-10 MED ORDER — ONDANSETRON HCL 4 MG/2ML IJ SOLN: 4.0000 mg | Freq: Four times a day (QID) | INTRAMUSCULAR | Status: DC | PRN

## 2016-12-10 MED ORDER — HEPARIN (PORCINE) IN NACL 2-0.9 UNIT/ML-% IJ SOLN
INTRAMUSCULAR | Status: AC
  Filled 2016-12-10: qty 1000

## 2016-12-10 MED ORDER — ANGIOPLASTY BOOK
Freq: Once | Status: AC
  Administered 2016-12-10: 21:00:00
  Filled 2016-12-10: qty 1

## 2016-12-10 MED ORDER — SODIUM CHLORIDE 0.9 % IV SOLN: 250.0000 mL | INTRAVENOUS | Status: DC | PRN

## 2016-12-10 MED ORDER — FENTANYL CITRATE (PF) 100 MCG/2ML IJ SOLN
INTRAMUSCULAR | Status: AC
  Filled 2016-12-10: qty 2

## 2016-12-10 MED ORDER — ADENOSINE (DIAGNOSTIC) 140MCG/KG/MIN
INTRAVENOUS | Status: DC | PRN
  Administered 2016-12-10: 140 ug/kg/min via INTRAVENOUS

## 2016-12-10 MED ORDER — BIVALIRUDIN BOLUS VIA INFUSION - CUPID
INTRAVENOUS | Status: DC | PRN
  Administered 2016-12-10: 52.575 mg via INTRAVENOUS

## 2016-12-10 MED ORDER — SODIUM CHLORIDE 0.9 % IV SOLN
INTRAVENOUS | Status: AC
  Administered 2016-12-10: 15:00:00 via INTRAVENOUS

## 2016-12-10 MED ORDER — MIDAZOLAM HCL 2 MG/2ML IJ SOLN
INTRAMUSCULAR | Status: AC
  Filled 2016-12-10: qty 2

## 2016-12-10 MED ORDER — HYDRALAZINE HCL 20 MG/ML IJ SOLN: 5.0000 mg | INTRAMUSCULAR | Status: AC | PRN

## 2016-12-10 MED ORDER — SODIUM CHLORIDE 0.9% FLUSH
3.0000 mL | Freq: Two times a day (BID) | INTRAVENOUS | Status: DC
  Administered 2016-12-11: 3 mL via INTRAVENOUS

## 2016-12-10 SURGICAL SUPPLY — 18 items
BALLN MOZEC 2.0X12 (BALLOONS) ×2
BALLOON MOZEC 2.0X12 (BALLOONS) ×1 IMPLANT
CATH 5FR JL3.5 JR4 ANG PIG MP (CATHETERS) ×3 IMPLANT
CATH MICROCATH NAVVUS (MICROCATHETER) ×1 IMPLANT
DEVICE RAD COMP TR BAND LRG (VASCULAR PRODUCTS) ×3 IMPLANT
GLIDESHEATH SLEND SS 6F .021 (SHEATH) ×3 IMPLANT
GUIDE CATH RUNWAY 6FR CLS3 (CATHETERS) ×3 IMPLANT
GUIDEWIRE INQWIRE 1.5J.035X260 (WIRE) ×1 IMPLANT
INQWIRE 1.5J .035X260CM (WIRE) ×3
KIT ENCORE 26 ADVANTAGE (KITS) ×3 IMPLANT
KIT HEART LEFT (KITS) ×3 IMPLANT
MICROCATHETER NAVVUS (MICROCATHETER) ×3
PACK CARDIAC CATHETERIZATION (CUSTOM PROCEDURE TRAY) ×3 IMPLANT
TRANSDUCER W/STOPCOCK (MISCELLANEOUS) ×3 IMPLANT
TUBING CIL FLEX 10 FLL-RA (TUBING) ×3 IMPLANT
VALVE GUARDIAN II ~~LOC~~ HEMO (MISCELLANEOUS) ×3 IMPLANT
VALVE MANIFOLD 3 PORT W/RA/ON (MISCELLANEOUS) IMPLANT
WIRE ASAHI PROWATER 180CM (WIRE) ×3 IMPLANT

## 2016-12-10 NOTE — Progress Notes (Signed)
Patient ID: Keith Herrera, male   DOB: 18-Dec-1931, 83 y.o.   MRN: [id number]    Subjective:  Denies SSCP, palpitations or Dyspnea Wants to go home after cath   Objective:  Vitals:   12/10/16 0439 12/10/16 0606 12/10/16 0754 12/10/16 0821  BP: 127/63  (!) 154/80   Pulse: 61     Resp: 15     Temp: 97.8 F (36.6 C)     TempSrc: Oral     SpO2: 97%   97%  Weight:  154 lb 9.6 oz (70.1 kg)    Height:        Intake/Output from previous day:  Intake/Output Summary (Last 24 hours) at 12/10/16 0827 Last data filed at 12/10/16 [id number]  Gross per 24 hour  Intake              453 ml  Output              550 ml  Net              -97 ml    Physical Exam: Affect appropriate Healthy:  appears stated age HEENT: normal Neck supple with no adenopathy JVP normal no bruits no thyromegaly Lungs clear with no wheezing and good diaphragmatic motion Heart:  S1/S2 no murmur, no rub, gallop or click PMI normal Abdomen: benighn, BS positve, no tenderness, no AAA no bruit.  No HSM or HJR Distal pulses intact with no bruits No edema Neuro non-focal Skin warm and dry No muscular weakness   Lab Results: Basic Metabolic Panel:  Recent Labs  12/08/16 1657  NA 138  K 4.7  CL 105  CO2 25  GLUCOSE 201*  BUN 18  CREATININE 1.08  CALCIUM 9.3     Recent Labs  12/08/16 1657  WBC 5.9  HGB 15.2  HCT 44.2  MCV 87.4  PLT 169   Cardiac Enzymes:  Recent Labs  12/08/16 2248 12/09/16 0412 12/09/16 1336  TROPONINI <0.03 <0.03 <0.03    Imaging: Dg Chest 2 View  Result Date: 12/08/2016 CLINICAL DATA:  Chest fluttering. EXAM: CHEST  2 VIEW COMPARISON:  Find 02/07/2016 FINDINGS: Lungs are mildly hyperinflated but stable. No pneumonic consolidation or effusion. No pneumothorax. Heart is normal in size. There is aortic atherosclerosis. There is mild dextroconvex curvature at the thoracolumbar junction. No acute osseous abnormality. IMPRESSION: No active cardiopulmonary disease.   Hyperinflated lungs. Electronically Signed   By: Ashley Royalty M.D.   On: 12/08/2016 18:43   Nm Myocar Multi W/spect W/wall Motion / Ef  Result Date: 12/09/2016 CLINICAL DATA:  Chest pain, coronary artery disease EXAM: MYOCARDIAL IMAGING WITH SPECT (REST AND PHARMACOLOGIC-STRESS) GATED LEFT VENTRICULAR WALL MOTION STUDY LEFT VENTRICULAR EJECTION FRACTION TECHNIQUE: Standard myocardial SPECT imaging was performed after resting intravenous injection of 10 mCi Tc-76m tetrofosmin. Subsequently, intravenous infusion of Lexiscan was performed under the supervision of the Cardiology staff. At peak effect of the drug, 30 mCi Tc-54m tetrofosmin was injected intravenously and standard myocardial SPECT imaging was performed. Quantitative gated imaging was also performed to evaluate left ventricular wall motion, and estimate left ventricular ejection fraction. COMPARISON:  None. FINDINGS: Perfusion: There is decreased activity in the inferior wall on both rest and stress images. This area moves and thickens normally suggesting soft tissue attenuation. However, more pronounced decreased activity is noted in the inferior wall and adjacent lateral wall on stress imaging concerning for inducible ischemia. Wall Motion: Normal left ventricular wall motion. No left ventricular dilation. Left Ventricular Ejection Fraction: 66 %  End diastolic volume 56 ml End systolic volume 19 ml IMPRESSION: 1. Findings concerning for inducible ischemia in the inferolateral wall. 2. Normal left ventricular wall motion. 3. Left ventricular ejection fraction 66% 4. Non invasive risk stratification*: Intermediate *2012 Appropriate Use Criteria for Coronary Revascularization Focused Update: J Am Coll Cardiol. [id number]. [email or web address] Electronically Signed   By: Rolm Baptise M.D.   On: 12/09/2016 12:22    Cardiac Studies:  ECG: SR short PR rate 59 flat inferolateral ST segments    Telemetry:   NSR   Echo: 12/09/16 EF 65-70% no significant valve disease   Medications:   . amLODipine  10 mg Oral Daily  . aspirin EC  81 mg Oral QPC supper  . atorvastatin  10 mg Oral q1800  . brimonidine  1 drop Both Eyes TID  . carvedilol  6.25 mg Oral BID WC  . enoxaparin (LOVENOX) injection  40 mg Subcutaneous Q24H  . insulin aspart  0-9 Units Subcutaneous TID WC  . isosorbide dinitrate  10 mg Oral TID  . latanoprost  1 drop Both Eyes QHS  . losartan  100 mg Oral Daily  . pantoprazole  40 mg Oral Daily  . regadenoson  0.4 mg Intravenous Once  . sodium chloride flush  3 mL Intravenous Q12H     . sodium chloride 1 mL/kg/hr (12/10/16 0657)    Assessment/Plan:  Chest Pain: r/o pain free this am abnormal myovue with inferior ischemia known residual small vessel disease And stent to LAD/D1 cath today hold nitrates as BP got low yesterday  HTN:  Norvasc increased to 10 mg improved   Palpitations: continue low dose beta blocker telemetry no SVT   Ok to d/c home after cath if no intervention let PA know   Jenkins Rouge 12/10/2016, 8:27 AM

## 2016-12-10 NOTE — Progress Notes (Signed)
Pt refusing PIV insertion at this time, verbalized that procedure won't be until noon, wants to do it later in the morning.

## 2016-12-10 NOTE — Interval H&P Note (Signed)
Cath Lab Visit (complete for each Cath Lab visit)  Clinical Evaluation Leading to the Procedure:   ACS: Yes.    Non-ACS:    Anginal Classification: CCS IV  Anti-ischemic medical therapy: Minimal Therapy (1 class of medications)  Non-Invasive Test Results: Intermediate-risk stress test findings: cardiac mortality 1-3%/year  Prior CABG: No previous CABG      History and Physical Interval Note:  12/10/2016 1:32 PM  Ramiro Harvest  has presented today for surgery, with the diagnosis of abnormal nuc  The various methods of treatment have been discussed with the patient and family. After consideration of risks, benefits and other options for treatment, the patient has consented to  Procedure(s): Left Heart Cath and Coronary Angiography (N/A) as a surgical intervention .  The patient's history has been reviewed, patient examined, no change in status, stable for surgery.  I have reviewed the patient's chart and labs.  Questions were answered to the patient's satisfaction.     Keith Herrera

## 2016-12-10 NOTE — Progress Notes (Signed)
PROGRESS NOTE    Keith Herrera  [id number] DOB: 03/23/1932 DOA: 12/08/2016 PCP: Cathlean Cower, MD   Outpatient Specialists:     Brief Narrative:   Keith Herrera is a 83 y.o. male with history of CAD status post stenting, history of SVT, hypertension and diabetes mellitus presents to the ER because of chest pain. Patient started experiencing chest pain last evening while at home. Chest pain lasted for around 10-15 minutes along with palpitations. Chest pain resolved without any intervention. Chest pain was pressure-like radiating to left arm. No associated shortness of breath or productive cough. In the ER EKG was showing sinus bradycardia troponins were negative chest x-ray was unremarkable. ER physician did discuss with Dr. Radford Pax cardiologist who recommended admission to further rule out ACS. At the time of my exam patient is asymptomatic.    Assessment & Plan:   Principal Problem:   Chest pain Active Problems:   Essential hypertension   CAD, NATIVE VESSEL   Type 2 diabetes mellitus with vascular disease (HCC)   Unstable angina pectoris (HCC)   Abnormal nuclear stress test   1. Chest pain with history of CAD status post stenting - -intermediate risk ST -cath showed: Non-dominant vessel, Mid RCA lesion, 100 %stenosed. This has progressed since 2012. There are right to right and left to right collaterals.  Patent stent in the first Diag.  Patent proximal LAD stent.  Ost 2nd Diag to mid 2nd Diag lesion, 75 %stenosed. Stable disease since 2012.  1st Diag lesion, 80 %stenosed. This lesion was past the prior stent. After PTCA with a 2.0 balloon, a stent could not be delivered due to the prior LAD and diagional stents.  Post intervention with PTCA, there is a 10% residual stenosis.  LV end diastolic pressure is normal.  There is no aortic valve stenosis.  50% mid LAD lesion which was assessed by FFR. Resting FFR was 0.89 and after adenosine, FFR dropped to 0.82.     Continue dual antiplatelet therapy for at least a month. No stent was actually placed. The stent could not be delivered due to the prior LAD and diagonal stents. There is an excellent angioplasty result.  2. Palpitation with history of SVT -  TSH 11 months ago normal. Closely monitor in telemetr 3. Hypertension - continue amlodipine- increased 4. Diabetes mellitus type 2 - placed patient on sliding scale coverage.   DVT prophylaxis:  lovenox  Code Status: Full Code   Family Communication: Wife at bedside  Disposition Plan:   Home in AM  Consultants:   cardiology   Subjective: Anxious to go home  Objective: Vitals:   12/10/16 1451 12/10/16 1456 12/10/16 1501 12/10/16 1516  BP:    (!) 143/68  Pulse: (!) 0 (!) 0 (!) 0 (!) 57  Resp: (!) 0 (!) 0 (!) 6 17  Temp:    97.5 F (36.4 C)  TempSrc:    Oral  SpO2: (!) 0% (!) 0% (!) 0% 97%  Weight:      Height:        Intake/Output Summary (Last 24 hours) at 12/10/16 1522 Last data filed at 12/10/16 1457  Gross per 24 hour  Intake              633 ml  Output              770 ml  Net             -137 ml   [employer]  12/08/16 2300 12/09/16 0509 12/10/16 0606  Weight: 73.1 kg (161 lb 1.6 oz) 71 kg (156 lb 8 oz) 70.1 kg (154 lb 9.6 oz)    Examination:  General exam: Appears calm and comfortable  Respiratory system: Clear to auscultation. Respiratory effort normal. Cardiovascular system: S1 & S2 heard, RRR. No JVD, murmurs, rubs, gallops or clicks. No pedal edema. Gastrointestinal system: Abdomen is nondistended, soft and nontender. No organomegaly or masses felt. Normal bowel sounds heard. Central nervous system: Alert and oriented. No focal neurological deficits.     Data Reviewed: I have personally reviewed following labs and imaging studies  CBC:  Recent Labs Lab 12/08/16 1657  WBC 5.9  HGB 15.2  HCT 44.2  MCV 87.4  PLT 123XX123   Basic Metabolic Panel:  Recent Labs Lab 12/08/16 1657  NA 138  K  4.7  CL 105  CO2 25  GLUCOSE 201*  BUN 18  CREATININE 1.08  CALCIUM 9.3   GFR: Estimated Creatinine Clearance: 52.3 mL/min (by C-G formula based on SCr of 1.08 mg/dL). Liver Function Tests: No results for input(s): AST, ALT, ALKPHOS, BILITOT, PROT, ALBUMIN in the last 168 hours. No results for input(s): LIPASE, AMYLASE in the last 168 hours. No results for input(s): AMMONIA in the last 168 hours. Coagulation Profile:  Recent Labs Lab 12/10/16 1033  INR 1.15   Cardiac Enzymes:  Recent Labs Lab 12/08/16 2248 12/09/16 0412 12/09/16 1336  TROPONINI <0.03 <0.03 <0.03   BNP (last 3 results) No results for input(s): PROBNP in the last 8760 hours. HbA1C: No results for input(s): HGBA1C in the last 72 hours. CBG:  Recent Labs Lab 12/09/16 1135 12/09/16 1711 12/09/16 2208 12/10/16 0738 12/10/16 1133  GLUCAP 222* 143* 206* 160* 126*   Lipid Profile: No results for input(s): CHOL, HDL, LDLCALC, TRIG, CHOLHDL, LDLDIRECT in the last 72 hours. Thyroid Function Tests: No results for input(s): TSH, T4TOTAL, FREET4, T3FREE, THYROIDAB in the last 72 hours. Anemia Panel: No results for input(s): VITAMINB12, FOLATE, FERRITIN, TIBC, IRON, RETICCTPCT in the last 72 hours. Urine analysis:    Component Value Date/Time   COLORURINE YELLOW 12/21/2015 [place] 12/21/2015 1044   LABSPEC 1.020 12/21/2015 1044   PHURINE 6.0 12/21/2015 1044   GLUCOSEU NEGATIVE 12/21/2015 1044   HGBUR NEGATIVE 12/21/2015 1044   BILIRUBINUR NEGATIVE 12/21/2015 1044   KETONESUR NEGATIVE 12/21/2015 1044   PROTEINUR NEGATIVE 03/14/2010 1101   UROBILINOGEN 0.2 12/21/2015 1044   NITRITE NEGATIVE 12/21/2015 1044   LEUKOCYTESUR NEGATIVE 12/21/2015 1044    )No results found for this or any previous visit (from the past 240 hour(s)).    Anti-infectives    None       Radiology Studies: Dg Chest 2 View  Result Date: 12/08/2016 CLINICAL DATA:  Chest fluttering. EXAM: CHEST  2 VIEW  COMPARISON:  Find 02/07/2016 FINDINGS: Lungs are mildly hyperinflated but stable. No pneumonic consolidation or effusion. No pneumothorax. Heart is normal in size. There is aortic atherosclerosis. There is mild dextroconvex curvature at the thoracolumbar junction. No acute osseous abnormality. IMPRESSION: No active cardiopulmonary disease.  Hyperinflated lungs. Electronically Signed   By: Ashley Royalty M.D.   On: 12/08/2016 18:43   Nm Myocar Multi W/spect W/wall Motion / Ef  Result Date: 12/09/2016 CLINICAL DATA:  Chest pain, coronary artery disease EXAM: MYOCARDIAL IMAGING WITH SPECT (REST AND PHARMACOLOGIC-STRESS) GATED LEFT VENTRICULAR WALL MOTION STUDY LEFT VENTRICULAR EJECTION FRACTION TECHNIQUE: Standard myocardial SPECT imaging was performed after resting intravenous injection of 10 mCi  Tc-25m tetrofosmin. Subsequently, intravenous infusion of Lexiscan was performed under the supervision of the Cardiology staff. At peak effect of the drug, 30 mCi Tc-14m tetrofosmin was injected intravenously and standard myocardial SPECT imaging was performed. Quantitative gated imaging was also performed to evaluate left ventricular wall motion, and estimate left ventricular ejection fraction. COMPARISON:  None. FINDINGS: Perfusion: There is decreased activity in the inferior wall on both rest and stress images. This area moves and thickens normally suggesting soft tissue attenuation. However, more pronounced decreased activity is noted in the inferior wall and adjacent lateral wall on stress imaging concerning for inducible ischemia. Wall Motion: Normal left ventricular wall motion. No left ventricular dilation. Left Ventricular Ejection Fraction: 66 % End diastolic volume 56 ml End systolic volume 19 ml IMPRESSION: 1. Findings concerning for inducible ischemia in the inferolateral wall. 2. Normal left ventricular wall motion. 3. Left ventricular ejection fraction 66% 4. Non invasive risk stratification*: Intermediate  *2012 Appropriate Use Criteria for Coronary Revascularization Focused Update: J Am Coll Cardiol. N6492421. [email or web address] Electronically Signed   By: Rolm Baptise M.D.   On: 12/09/2016 12:22        Scheduled Meds: . amLODipine  10 mg Oral Daily  . aspirin EC  81 mg Oral QPC supper  . atorvastatin  10 mg Oral q1800  . brimonidine  1 drop Both Eyes TID  . carvedilol  6.25 mg Oral BID WC  . enoxaparin (LOVENOX) injection  40 mg Subcutaneous Q24H  . insulin aspart  0-9 Units Subcutaneous TID WC  . isosorbide dinitrate  10 mg Oral TID  . latanoprost  1 drop Both Eyes QHS  . losartan  100 mg Oral Daily  . pantoprazole  40 mg Oral Daily  . regadenoson  0.4 mg Intravenous Once   Continuous Infusions:   LOS: 0 days    Time spent: 25 min    [place], DO [facility] Pager [phone]  If 7PM-7AM, please contact night-coverage [email or web address] Password [email or web address] 12/10/2016, 3:22 PM

## 2016-12-10 NOTE — H&P (View-Only) (Signed)
Patient ID: Keith Herrera, male   DOB: [birth year], 83 y.o.   MRN: [id number]    Subjective:  Denies SSCP, palpitations or Dyspnea Wants to go home after cath   Objective:  Vitals:   12/10/16 0439 12/10/16 0606 12/10/16 0754 12/10/16 0821  BP: 127/63  (!) 154/80   Pulse: 61     Resp: 15     Temp: 97.8 F (36.6 C)     TempSrc: Oral     SpO2: 97%   97%  Weight:  154 lb 9.6 oz (70.1 kg)    Height:        Intake/Output from previous day:  Intake/Output Summary (Last 24 hours) at 12/10/16 0827 Last data filed at 12/10/16 [id number]  Gross per 24 hour  Intake              453 ml  Output              550 ml  Net              -97 ml    Physical Exam: Affect appropriate Healthy:  appears stated age HEENT: normal Neck supple with no adenopathy JVP normal no bruits no thyromegaly Lungs clear with no wheezing and good diaphragmatic motion Heart:  S1/S2 no murmur, no rub, gallop or click PMI normal Abdomen: benighn, BS positve, no tenderness, no AAA no bruit.  No HSM or HJR Distal pulses intact with no bruits No edema Neuro non-focal Skin warm and dry No muscular weakness   Lab Results: Basic Metabolic Panel:  Recent Labs  12/08/16 1657  NA 138  K 4.7  CL 105  CO2 25  GLUCOSE 201*  BUN 18  CREATININE 1.08  CALCIUM 9.3     Recent Labs  12/08/16 1657  WBC 5.9  HGB 15.2  HCT 44.2  MCV 87.4  PLT 169   Cardiac Enzymes:  Recent Labs  12/08/16 2248 12/09/16 0412 12/09/16 1336  TROPONINI <0.03 <0.03 <0.03    Imaging: Dg Chest 2 View  Result Date: 12/08/2016 CLINICAL DATA:  Chest fluttering. EXAM: CHEST  2 VIEW COMPARISON:  Find 02/07/2016 FINDINGS: Lungs are mildly hyperinflated but stable. No pneumonic consolidation or effusion. No pneumothorax. Heart is normal in size. There is aortic atherosclerosis. There is mild dextroconvex curvature at the thoracolumbar junction. No acute osseous abnormality. IMPRESSION: No active cardiopulmonary disease.   Hyperinflated lungs. Electronically Signed   By: Ashley Royalty M.D.   On: 12/08/2016 18:43   Nm Myocar Multi W/spect W/wall Motion / Ef  Result Date: 12/09/2016 CLINICAL DATA:  Chest pain, coronary artery disease EXAM: MYOCARDIAL IMAGING WITH SPECT (REST AND PHARMACOLOGIC-STRESS) GATED LEFT VENTRICULAR WALL MOTION STUDY LEFT VENTRICULAR EJECTION FRACTION TECHNIQUE: Standard myocardial SPECT imaging was performed after resting intravenous injection of 10 mCi Tc-37m tetrofosmin. Subsequently, intravenous infusion of Lexiscan was performed under the supervision of the Cardiology staff. At peak effect of the drug, 30 mCi Tc-18m tetrofosmin was injected intravenously and standard myocardial SPECT imaging was performed. Quantitative gated imaging was also performed to evaluate left ventricular wall motion, and estimate left ventricular ejection fraction. COMPARISON:  None. FINDINGS: Perfusion: There is decreased activity in the inferior wall on both rest and stress images. This area moves and thickens normally suggesting soft tissue attenuation. However, more pronounced decreased activity is noted in the inferior wall and adjacent lateral wall on stress imaging concerning for inducible ischemia. Wall Motion: Normal left ventricular wall motion. No left ventricular dilation. Left Ventricular Ejection Fraction: 66 %  End diastolic volume 56 ml End systolic volume 19 ml IMPRESSION: 1. Findings concerning for inducible ischemia in the inferolateral wall. 2. Normal left ventricular wall motion. 3. Left ventricular ejection fraction 66% 4. Non invasive risk stratification*: Intermediate *2012 Appropriate Use Criteria for Coronary Revascularization Focused Update: J Am Coll Cardiol. N6492421. [email or web address] Electronically Signed   By: Rolm Baptise M.D.   On: 12/09/2016 12:22    Cardiac Studies:  ECG: SR short PR rate 59 flat inferolateral ST segments    Telemetry:   NSR   Echo: 12/09/16 EF 65-70% no significant valve disease   Medications:   . amLODipine  10 mg Oral Daily  . aspirin EC  81 mg Oral QPC supper  . atorvastatin  10 mg Oral q1800  . brimonidine  1 drop Both Eyes TID  . carvedilol  6.25 mg Oral BID WC  . enoxaparin (LOVENOX) injection  40 mg Subcutaneous Q24H  . insulin aspart  0-9 Units Subcutaneous TID WC  . isosorbide dinitrate  10 mg Oral TID  . latanoprost  1 drop Both Eyes QHS  . losartan  100 mg Oral Daily  . pantoprazole  40 mg Oral Daily  . regadenoson  0.4 mg Intravenous Once  . sodium chloride flush  3 mL Intravenous Q12H     . sodium chloride 1 mL/kg/hr (12/10/16 0657)    Assessment/Plan:  Chest Pain: r/o pain free this am abnormal myovue with inferior ischemia known residual small vessel disease And stent to LAD/D1 cath today hold nitrates as BP got low yesterday  HTN:  Norvasc increased to 10 mg improved   Palpitations: continue low dose beta blocker telemetry no SVT   Ok to d/c home after cath if no intervention let PA know   Jenkins Rouge 12/10/2016, 8:27 AM

## 2016-12-10 NOTE — Care Management Note (Signed)
Case Management Note  Patient Details  Name: DAKAI HASKILL MRN: [id number] Date of Birth: 12-08-1931  Subjective/Objective:     S/p coronary balloon angioplasty, will be on plavix, NCM will cont to follow for dc needs.               Action/Plan:   Expected Discharge Date:                  Expected Discharge Plan:  [place]  In-House Referral:     Discharge planning Services  CM Consult  Post Acute Care Choice:    Choice offered to:     DME Arranged:    DME Agency:     HH Arranged:    [place] Agency:     Status of Service:  In process, will continue to follow  If discussed at Long Length of Stay Meetings, dates discussed:    Additional Comments:  Zenon Mayo, RN 12/10/2016, 6:56 PM

## 2016-12-11 ENCOUNTER — Encounter (HOSPITAL_COMMUNITY): Payer: Self-pay | Admitting: Interventional Cardiology

## 2016-12-11 LAB — BASIC METABOLIC PANEL
Anion gap: 9 (ref 5–15)
BUN: 15 mg/dL (ref 6–20)
CO2: 22 mmol/L (ref 22–32)
CREATININE: 1.12 mg/dL (ref 0.61–1.24)
Calcium: 8.7 mg/dL — ABNORMAL LOW (ref 8.9–10.3)
Chloride: 108 mmol/L (ref 101–111)
GFR calc Af Amer: >60 mL/min (ref >60)
GFR, EST NON AFRICAN AMERICAN: 59 mL/min — AB (ref >60)
GLUCOSE: 185 mg/dL — AB (ref 65–99)
POTASSIUM: 3.6 mmol/L (ref 3.5–5.1)
Sodium: 139 mmol/L (ref 135–145)

## 2016-12-11 LAB — CBC
HCT: 41.5 % (ref 39.0–52.0)
Hemoglobin: 14.1 g/dL (ref 13.0–17.0)
MCH: 29.6 pg (ref 26.0–34.0)
MCHC: 34 g/dL (ref 30.0–36.0)
MCV: 87.2 fL (ref 78.0–100.0)
PLATELETS: 156 10*3/uL (ref 150–400)
RBC: 4.76 MIL/uL (ref 4.22–5.81)
RDW: 13.4 % (ref 11.5–15.5)
WBC: 6.9 10*3/uL (ref 4.0–10.5)

## 2016-12-11 LAB — GLUCOSE, CAPILLARY: Glucose-Capillary: 135 mg/dL — ABNORMAL HIGH (ref 65–99)

## 2016-12-11 MED ORDER — CLOPIDOGREL BISULFATE 75 MG PO TABS: 75.0000 mg | ORAL_TABLET | Freq: Every day | ORAL | 0 refills | Status: DC

## 2016-12-11 MED ORDER — AMLODIPINE BESYLATE 10 MG PO TABS: 10.0000 mg | ORAL_TABLET | Freq: Every day | ORAL | 0 refills | Status: DC

## 2016-12-11 MED ORDER — NITROGLYCERIN 0.4 MG SL SUBL: 0.4000 mg | SUBLINGUAL_TABLET | SUBLINGUAL | 0 refills | Status: DC | PRN

## 2016-12-11 MED ORDER — ISOSORBIDE DINITRATE 10 MG PO TABS: 10.0000 mg | ORAL_TABLET | Freq: Three times a day (TID) | ORAL | 0 refills | Status: DC

## 2016-12-11 NOTE — Discharge Summary (Signed)
Physician Discharge Summary  Keith Herrera DOB: 1932-10-10 DOA: 12/08/2016  PCP: Cathlean Cower, MD  Admit date: 12/08/2016 Discharge date: 12/11/2016   Recommendations for Outpatient Follow-Up:   1. Resume metformin 2 days   Discharge Diagnosis:   Principal Problem:   Chest pain Active Problems:   Essential hypertension   CAD, NATIVE VESSEL   Type 2 diabetes mellitus with vascular disease (HCC)   Unstable angina pectoris ([place])   Abnormal nuclear stress test   Discharge disposition:  Home.  Discharge Condition: Improved.  Diet recommendation: Low sodium, heart healthy.  Carbohydrate-modified.  Regular.  Wound care: None.   History of Present Illness:   Keith Herrera is a 83 y.o. male with history of CAD status post stenting, history of SVT, hypertension and diabetes mellitus presents to the ER because of chest pain. Patient started experiencing chest pain last evening while at home. Chest pain lasted for around 10-15 minutes along with palpitations. Chest pain resolved without any intervention. Chest pain was pressure-like radiating to left arm. No associated shortness of breath or productive cough. In the ER EKG was showing sinus bradycardia troponins were negative chest x-ray was unremarkable. ER physician did discuss with Dr. Radford Pax cardiologist who recommended admission to further rule out ACS. At the time of my exam patient is asymptomatic.    Hospital Course by Problem:   Chest pain:  -stress test intermediate: -cath done: 100% occl mRCA with R--> L collaterals, patents stent in D1 and LAD, 75% occl D2 and 80% occl D1 stenosis just past prior stent which was treated with PCTA (a stent could not be delivered due to the prior LAD and diagional stents), 50% mLAD occl which was not significant by FFR.  -- Continue ASA/plavix for at least 1 month, but consider longterm use of plavix, per cath note. Continue BB and statin.   HTN:  -amoldopine  increased  -Continue Losartan 100mg  daily and Coreg 6.25mg  BID   DMT2: HGA1c 6.8 -resume metformin in 2 days   HLD: LDL 63  -Continue atorvastatin 10mg  daily.     Medical Consultants:    cardiology   Discharge Exam:   Vitals:   12/11/16 0652 12/11/16 0654  BP: (!) 163/76 (!) 163/76  Pulse: (!) 56 (!) 57  Resp: 15 16  Temp: 97.8 F (36.6 C)    Vitals:   12/10/16 2000 12/10/16 2100 12/11/16 0652 12/11/16 0654  BP: (!) 99/47 (!) 125/46 (!) 163/76 (!) 163/76  Pulse: (!) 57 (!) 52 (!) 56 (!) 57  Resp: 17 19 15 16   Temp:  97.5 F (36.4 C) 97.8 F (36.6 C)   TempSrc:  Oral Oral   SpO2: 97% 97% 97% 98%  Weight:   71 kg (156 lb 8.4 oz)   Height:        Gen:  NAD    The results of significant diagnostics from this hospitalization (including imaging, microbiology, ancillary and laboratory) are listed below for reference.     Procedures and Diagnostic Studies:   Dg Chest 2 View  Result Date: 12/08/2016 CLINICAL DATA:  Chest fluttering. EXAM: CHEST  2 VIEW COMPARISON:  Find 02/07/2016 FINDINGS: Lungs are mildly hyperinflated but stable. No pneumonic consolidation or effusion. No pneumothorax. Heart is normal in size. There is aortic atherosclerosis. There is mild dextroconvex curvature at the thoracolumbar junction. No acute osseous abnormality. IMPRESSION: No active cardiopulmonary disease.  Hyperinflated lungs. Electronically Signed   By: Ashley Royalty M.D.   On: 12/08/2016 18:43  Nm Myocar Multi W/spect W/wall Motion / Ef  Result Date: 12/09/2016 CLINICAL DATA:  Chest pain, coronary artery disease EXAM: MYOCARDIAL IMAGING WITH SPECT (REST AND PHARMACOLOGIC-STRESS) GATED LEFT VENTRICULAR WALL MOTION STUDY LEFT VENTRICULAR EJECTION FRACTION TECHNIQUE: Standard myocardial SPECT imaging was performed after resting intravenous injection of 10 mCi Tc-96m tetrofosmin. Subsequently, intravenous infusion of Lexiscan was performed under the supervision of the Cardiology staff.  At peak effect of the drug, 30 mCi Tc-9m tetrofosmin was injected intravenously and standard myocardial SPECT imaging was performed. Quantitative gated imaging was also performed to evaluate left ventricular wall motion, and estimate left ventricular ejection fraction. COMPARISON:  None. FINDINGS: Perfusion: There is decreased activity in the inferior wall on both rest and stress images. This area moves and thickens normally suggesting soft tissue attenuation. However, more pronounced decreased activity is noted in the inferior wall and adjacent lateral wall on stress imaging concerning for inducible ischemia. Wall Motion: Normal left ventricular wall motion. No left ventricular dilation. Left Ventricular Ejection Fraction: 66 % End diastolic volume 56 ml End systolic volume 19 ml IMPRESSION: 1. Findings concerning for inducible ischemia in the inferolateral wall. 2. Normal left ventricular wall motion. 3. Left ventricular ejection fraction 66% 4. Non invasive risk stratification*: Intermediate *2012 Appropriate Use Criteria for Coronary Revascularization Focused Update: J Am Coll Cardiol. N6492421. [email or web address] Electronically Signed   By: Rolm Baptise M.D.   On: 12/09/2016 12:22     Labs:   Basic Metabolic Panel:  Recent Labs Lab 12/08/16 1657 12/11/16 0211  NA 138 139  K 4.7 3.6  CL 105 108  CO2 25 22  GLUCOSE 201* 185*  BUN 18 15  CREATININE 1.08 1.12  CALCIUM 9.3 8.7*   GFR Estimated Creatinine Clearance: 51.1 mL/min (by C-G formula based on SCr of 1.12 mg/dL). Liver Function Tests: No results for input(s): AST, ALT, ALKPHOS, BILITOT, PROT, ALBUMIN in the last 168 hours. No results for input(s): LIPASE, AMYLASE in the last 168 hours. No results for input(s): AMMONIA in the last 168 hours. Coagulation profile  Recent Labs Lab 12/10/16 1033  INR 1.15    CBC:  Recent Labs Lab 12/08/16 1657 12/11/16 0211  WBC 5.9 6.9    HGB 15.2 14.1  HCT 44.2 41.5  MCV 87.4 87.2  PLT 169 156   Cardiac Enzymes:  Recent Labs Lab 12/08/16 2248 12/09/16 0412 12/09/16 1336  TROPONINI <0.03 <0.03 <0.03   BNP: Invalid input(s): POCBNP CBG:  Recent Labs Lab 12/10/16 0738 12/10/16 1133 12/10/16 1556 12/10/16 2124 12/11/16 0651  GLUCAP 160* 126* 123* 199* 135*   D-Dimer No results for input(s): DDIMER in the last 72 hours. Hgb A1c No results for input(s): HGBA1C in the last 72 hours. Lipid Profile No results for input(s): CHOL, HDL, LDLCALC, TRIG, CHOLHDL, LDLDIRECT in the last 72 hours. Thyroid function studies No results for input(s): TSH, T4TOTAL, T3FREE, THYROIDAB in the last 72 hours.  Invalid input(s): FREET3 Anemia work up No results for input(s): VITAMINB12, FOLATE, FERRITIN, TIBC, IRON, RETICCTPCT in the last 72 hours. Microbiology No results found for this or any previous visit (from the past 240 hour(s)).   Discharge Instructions:   Discharge Instructions    Diet - low sodium heart healthy    Complete by:  As directed    Diet Carb Modified    Complete by:  As directed    Increase activity slowly    Complete by:  As directed   Resume metformin in 2 day   Allergies as  of 12/11/2016      Reactions   Crestor [rosuvastatin Calcium] Rash   All over body   Lovastatin Itching, Rash      Medication List    TAKE these medications   amLODipine 10 MG tablet Commonly known as:  NORVASC Take 1 tablet (10 mg total) by mouth daily. What changed:  medication strength  how much to take   aspirin EC 81 MG tablet Take 81 mg by mouth daily after supper.   atorvastatin 10 MG tablet Commonly known as:  LIPITOR TAKE 1 TABLET EVERY DAY   brimonidine 0.2 % ophthalmic solution Commonly known as:  ALPHAGAN Place 1 drop into both eyes 3 (three) times daily.   carvedilol 6.25 MG tablet Commonly known as:  COREG TAKE 1 TABLET (6.25 MG TOTAL) BY MOUTH 2 (TWO) TIMES DAILY WITH A MEAL.    clopidogrel 75 MG tablet Commonly known as:  PLAVIX Take 1 tablet (75 mg total) by mouth daily with breakfast. Start taking on:  12/12/2016   isosorbide dinitrate 10 MG tablet Commonly known as:  ISORDIL Take 1 tablet (10 mg total) by mouth 3 (three) times daily.   lansoprazole 30 MG capsule Commonly known as:  PREVACID TAKE 1 CAPSULE (30 MG TOTAL) BY MOUTH DAILY AT 12 NOON.   latanoprost 0.005 % ophthalmic solution Commonly known as:  XALATAN Place 1 drop into both eyes at bedtime.   losartan 100 MG tablet Commonly known as:  COZAAR TAKE 1 TABLET EVERY DAY   metFORMIN 500 MG tablet Commonly known as:  GLUCOPHAGE Take 500 mg by mouth 2 (two) times daily with a meal. Take 2 tabs by mouth in the am & 1 tab by mouth in the pm      Follow-up Information    Lyda Jester, PA-C Follow up on 12/24/2016.   Specialties:  Cardiology, Radiology Why:  @ 2:30pm (please arrive at least 10 minutes early).  Contact information: [address] [place] [address] [phone]            Time coordinating discharge: 35 min  Signed:  Eadie Repetto U Pina Sirianni   [facility] 12/11/2016, 8:43 AM

## 2016-12-11 NOTE — Progress Notes (Signed)
Patient Name: Keith Herrera Date of Encounter: 12/11/2016  Primary Cardiologist: [facility] Problem List     Principal Problem:   Chest pain Active Problems:   Essential hypertension   CAD, NATIVE VESSEL   Type 2 diabetes mellitus with vascular disease ([place])   Unstable angina pectoris (HCC)   Abnormal nuclear stress test     Subjective   No chest pain. Feeling good. Ready to go home.   Inpatient Medications    Scheduled Meds: . amLODipine  10 mg Oral Daily  . aspirin EC  81 mg Oral QPC supper  . atorvastatin  10 mg Oral q1800  . brimonidine  1 drop Both Eyes TID  . carvedilol  6.25 mg Oral BID WC  . clopidogrel  75 mg Oral Q breakfast  . enoxaparin (LOVENOX) injection  40 mg Subcutaneous Q24H  . insulin aspart  0-9 Units Subcutaneous TID WC  . isosorbide dinitrate  10 mg Oral TID  . latanoprost  1 drop Both Eyes QHS  . losartan  100 mg Oral Daily  . pantoprazole  40 mg Oral Daily  . regadenoson  0.4 mg Intravenous Once  . sodium chloride flush  3 mL Intravenous Q12H   Continuous Infusions:  PRN Meds: sodium chloride, acetaminophen, hydrALAZINE, ondansetron (ZOFRAN) IV, sodium chloride flush   Vital Signs    Vitals:   12/10/16 1930 12/10/16 1945 12/10/16 2000 12/10/16 2100  BP: (!) 97/52 (!) 105/45 (!) 99/47 (!) 125/46  Pulse: (!) 57 (!) 55 (!) 57 (!) 52  Resp: 18 17 17 19   Temp:    97.5 F (36.4 C)  TempSrc:    Oral  SpO2: 95% 96% 97% 97%  Weight:      Height:        Intake/Output Summary (Last 24 hours) at 12/11/16 [id number] Last data filed at 12/10/16 2300  Gross per 24 hour  Intake          1024.24 ml  Output              220 ml  Net           804.24 ml   Filed Weights   12/08/16 2300 12/09/16 0509 12/10/16 0606  Weight: 161 lb 1.6 oz (73.1 kg) 156 lb 8 oz (71 kg) 154 lb 9.6 oz (70.1 kg)    Physical Exam   GEN: Well nourished, well developed, in no acute distress.  HEENT: Grossly normal.  Neck: Supple, no JVD, carotid  bruits, or masses. Cardiac: RRR, no murmurs, rubs, or gallops. No clubbing, cyanosis, edema.  Radials/DP/PT 2+ and equal bilaterally.  Respiratory:  Respirations regular and unlabored, clear to auscultation bilaterally. GI: Soft, nontender, nondistended, BS + x 4. MS: no deformity or atrophy. Skin: warm and dry, no rash. Neuro:  Strength and sensation are intact. Psych: AAOx3.  Normal affect.  Labs    CBC  Recent Labs  12/08/16 1657 12/11/16 0211  WBC 5.9 6.9  HGB 15.2 14.1  HCT 44.2 41.5  MCV 87.4 87.2  PLT 169 [id number]   Basic Metabolic Panel  Recent Labs  12/08/16 1657 12/11/16 0211  NA 138 139  K 4.7 3.6  CL 105 108  CO2 25 22  GLUCOSE 201* 185*  BUN 18 15  CREATININE 1.08 1.12  CALCIUM 9.3 8.7*   Liver Function Tests No results for input(s): AST, ALT, ALKPHOS, BILITOT, PROT, ALBUMIN in the last 72 hours. No results for input(s): LIPASE, AMYLASE in the last 72  hours. Cardiac Enzymes  Recent Labs  12/08/16 2248 12/09/16 0412 12/09/16 1336  TROPONINI <0.03 <0.03 <0.03   BNP Invalid input(s): POCBNP D-Dimer No results for input(s): DDIMER in the last 72 hours. Hemoglobin A1C No results for input(s): HGBA1C in the last 72 hours. Fasting Lipid Panel No results for input(s): CHOL, HDL, LDLCALC, TRIG, CHOLHDL, LDLDIRECT in the last 72 hours. Thyroid Function Tests No results for input(s): TSH, T4TOTAL, T3FREE, THYROIDAB in the last 72 hours.  Invalid input(s): FREET3  Telemetry    Sinus brady - Personally Reviewed  ECG    SR short PR no acute ST changes nonspecific inferolateral ST segments unchanged from 2017  - Personally Reviewed  Radiology    Nm Myocar Multi W/spect W/wall Motion / Ef  Result Date: 12/09/2016 CLINICAL DATA:  Chest pain, coronary artery disease EXAM: MYOCARDIAL IMAGING WITH SPECT (REST AND PHARMACOLOGIC-STRESS) GATED LEFT VENTRICULAR WALL MOTION STUDY LEFT VENTRICULAR EJECTION FRACTION TECHNIQUE: Standard myocardial SPECT imaging  was performed after resting intravenous injection of 10 mCi Tc-28m tetrofosmin. Subsequently, intravenous infusion of Lexiscan was performed under the supervision of the Cardiology staff. At peak effect of the drug, 30 mCi Tc-70m tetrofosmin was injected intravenously and standard myocardial SPECT imaging was performed. Quantitative gated imaging was also performed to evaluate left ventricular wall motion, and estimate left ventricular ejection fraction. COMPARISON:  None. FINDINGS: Perfusion: There is decreased activity in the inferior wall on both rest and stress images. This area moves and thickens normally suggesting soft tissue attenuation. However, more pronounced decreased activity is noted in the inferior wall and adjacent lateral wall on stress imaging concerning for inducible ischemia. Wall Motion: Normal left ventricular wall motion. No left ventricular dilation. Left Ventricular Ejection Fraction: 66 % End diastolic volume 56 ml End systolic volume 19 ml IMPRESSION: 1. Findings concerning for inducible ischemia in the inferolateral wall. 2. Normal left ventricular wall motion. 3. Left ventricular ejection fraction 66% 4. Non invasive risk stratification*: Intermediate *2012 Appropriate Use Criteria for Coronary Revascularization Focused Update: J Am Coll Cardiol. [id number]. [email or web address] Electronically Signed   By: Rolm Baptise M.D.   On: 12/09/2016 12:22    Cardiac Studies   Cath 12/10/16 Coronary Balloon Angioplasty  Intravascular Pressure Wire/FFR Study  Left Heart Cath and Coronary Angiography  Conclusion    Non-dominant vessel, Mid RCA lesion, 100 %stenosed. This has progressed since 2012. There are right to right and left to right collaterals.  Patent stent in the first Diag.  Patent proximal LAD stent.  Ost 2nd Diag to mid 2nd Diag lesion, 75 %stenosed. Stable disease since 2012.  1st Diag lesion, 80 %stenosed. This lesion  was past the prior stent. After PTCA with a 2.0 balloon, a stent could not be delivered due to the prior LAD and diagional stents.  Post intervention with PTCA, there is a 10% residual stenosis.  LV end diastolic pressure is normal.  There is no aortic valve stenosis.  50% mid LAD lesion which was assessed by FFR. Resting FFR was 0.89 and after adenosine, FFR dropped to 0.82.   Continue dual antiplatelet therapy for at least a month. No stent was actually placed. The stent could not be delivered due to the prior LAD and diagonal stents. There is an excellent angioplasty result.  FFR of the mid LAD was negative. Continue aggressive medical therapy. Can consider continuing clopidogrel long-term.     2D ECHO: 12/09/2016 LV EF: 65% -   70% Study Conclusions - Left ventricle: The cavity size was  normal. Systolic function was   vigorous. The estimated ejection fraction was in the range of 65%   to 70%. Wall motion was normal; there were no regional wall   motion abnormalities. Doppler parameters are consistent with   abnormal left ventricular relaxation (grade 1 diastolic   dysfunction). Doppler parameters are consistent with elevated   ventricular end-diastolic filling pressure. - Aortic valve: Trileaflet; normal thickness leaflets. There was no   regurgitation. - Aortic root: The aortic root was normal in size. - Mitral valve: Calcified annulus. Mildly thickened leaflets .   Transvalvular velocity was within the normal range. There was no   evidence for stenosis. There was no regurgitation. - Left atrium: The atrium was normal in size. - Right ventricle: Systolic function was normal. - Right atrium: The atrium was normal in size. - Tricuspid valve: There was mild regurgitation. - Pulmonary arteries: Systolic pressure was within the normal   range. - Inferior vena cava: The vessel was normal in size. - Pericardium, extracardiac: There was no pericardial effusion.   Patient  Profile     EBBIE BOURBON is a 83 y.o. male with a history of CAD s/p previous PCI to LAD and D1 (2010), SVT, HTN and DMT2 who presented to [facility] on 12/08/16 with chest pain and palpitations.    Assessment & Plan    Chest pain: given history of stenting and known residual small vessel disease, myoview was recommended. This was performed on 12/09/16 and returned intermediate risk with possible inferior ischemia. He underwent cath yesterday which showed 100% occl mRCA with R--> L collaterals, patents stent in D1 and LAD, 75% occl D2 and 80% occl D1 stenosis just past prior stent which was treated with PCTA (a stent could not be delivered due to the prior LAD and diagional stents), 50% mLAD occl which was not significant by FFR.  -- Continue ASA/plavix for at least 1 month, but consider longterm use of plavix, per cath note. Continue BB and statin.   Palpitations: known hx of SVT (but no able to be induced on previous EP study). Followed by Dr. Lovena Le. No SVT on tele while admitted. Continue low dose BB  HTN: amoldopine increase to 10mg . Now BP Well controlled. Continue Losartan 100mg  daily and Coreg 6.25mg  BID (cannot increase this given baseline bradycardia).   DMT2: HGA1c 6.8 in 12/2015. Hold metformin at least 48 hours post cath.   HLD: LDL 63 in 12/2015. Continue atorvastatin 10mg  daily.   Dispo: okay to go home today with close outpatient follow up in our office on 12/24/16 with Lyda Jester PA-C   Signed, Angelena Form, PA-C  12/11/2016, 6:32 AM    Breathing much better ambulating no chest pain Exam no wheezing right radial cath sight A Will arrange outpatient f/u ok to d/c home has PA visit 12/24/16

## 2016-12-11 NOTE — Progress Notes (Signed)
TR BAND REMOVAL  LOCATION:    right radial  DEFLATED PER PROTOCOL:    Yes.    TIME BAND OFF / DRESSING APPLIED:    20:45   SITE UPON ARRIVAL:    Level 0  SITE AFTER BAND REMOVAL:    Level 0  CIRCULATION SENSATION AND MOVEMENT:    Within Normal Limits   Yes.    COMMENTS:   Post TR band instructions given. Pt tolerated well.

## 2016-12-11 NOTE — Progress Notes (Signed)
CARDIAC REHAB PHASE I   PRE:  Rate/Rhythm: 65 SR  BP:  Supine: 147/59  Sitting:   Standing:    SaO2: 96%RA  MODE:  Ambulation: 700 ft   POST:  Rate/Rhythm: 88 SR  BP:  Supine:   Sitting: 154/83  Standing:    SaO2: 98%RA 0805-0900 Pt walked 700 ft with steady gait with hand held asst. No CP. Tolerated well. Education completed with pt and wife who voiced understanding. Discussed NTG use, carb counting, ex ed and risk factors. Discussed CRP 2 and will refer to [place]. Discussed healthy food choices.   Graylon Good, RN BSN  12/11/2016 8:56 AM

## 2016-12-11 NOTE — Care Management Note (Signed)
Case Management Note  Patient Details  Name: Keith Herrera MRN: [id number] Date of Birth: [birth year]  Subjective/Objective:     S/p coronary balloon angioplasty, will be on plavix, for dc today , pcp is Cathlean Cower, no needs.                Action/Plan:   Expected Discharge Date:  12/11/16               Expected Discharge Plan:  [place]  In-House Referral:     Discharge planning Services  CM Consult  Post Acute Care Choice:    Choice offered to:     DME Arranged:    DME Agency:     HH Arranged:    [place] Agency:     Status of Service:  Completed, signed off  If discussed at [employer], dates discussed:    Additional Comments:  Zenon Mayo, RN 12/11/2016, 11:16 AM

## 2016-12-12 ENCOUNTER — Telehealth: Payer: Self-pay | Admitting: Internal Medicine

## 2016-12-12 NOTE — Telephone Encounter (Signed)
Called, spoke with pt. Pt had questions r/t medications Amlodipine and Carvedilol. Informed pt I reviewed hospital d/c instructions from 12/11/16. Discharge instructions state pt should be taking Amlodipine 10 mg daily and carvedilol 6.25 mg twice daily. Pt informed he was confused because he had a new Rx for Amlodipine. I informed it was because the medication dosage was increased. Reminded pt of upcoming appt with Ellen Henri, PA. on 12/24/16. Pt verbalized understanding and thanked me for calling.

## 2016-12-12 NOTE — Telephone Encounter (Signed)
New Message  Pt c/o medication issue:  1. Name of Medication: Amlodipine..... Carvedilol  2. How are you currently taking this medication (dosage and times per day)? 10mg .... 6.25mg    3. Are you having a reaction (difficulty breathing--STAT)? no  4. What is your medication issue? Per pt is not clear on instructions. Pt would like to know if he would need to take both or stop the carvedilol. Please call back to discuss

## 2016-12-16 ENCOUNTER — Telehealth: Payer: Self-pay | Admitting: Cardiovascular Disease

## 2016-12-16 NOTE — Telephone Encounter (Signed)
Agree with plan as outlined.

## 2016-12-16 NOTE — Telephone Encounter (Signed)
Keith Herrera is calling because the mediation Isosorbide is making him sick . Wants to speak with you about this . Please call .Marland Kitchen Thanks

## 2016-12-16 NOTE — Telephone Encounter (Signed)
I spoke with the pt and he states that he feels "sickly" after he takes isosorbide dinitrate.  The pt said his head feels heavy, he feels like he needs to burp and in general just does not feel well.  The pt denies chest pain. The pt states he has also had diarrhea 2-3 times per day since he left the hospital.  I advised him that this should be discussed with PCP and the pt has an upcoming appointment on Wednesday (no diarrhea today). The pt's BP today is 160/67 and pulse when walking on treadmill was 80. I advised the pt to hold Isosorbide Dinitrate at this time to see if he has improvements in symptoms.  The pt is taking his other medications listed and he will continue to monitor BP.  I will forward this message to Dr Burt Knack for review and further recommendations.

## 2016-12-18 ENCOUNTER — Telehealth: Payer: Self-pay | Admitting: Internal Medicine

## 2016-12-18 ENCOUNTER — Encounter: Payer: Self-pay | Admitting: Internal Medicine

## 2016-12-18 ENCOUNTER — Ambulatory Visit (INDEPENDENT_AMBULATORY_CARE_PROVIDER_SITE_OTHER): Payer: Medicare HMO | Admitting: Internal Medicine

## 2016-12-18 DIAGNOSIS — E1159 Type 2 diabetes mellitus with other circulatory complications: Secondary | ICD-10-CM

## 2016-12-18 DIAGNOSIS — I10 Essential (primary) hypertension: Secondary | ICD-10-CM | POA: Diagnosis not present

## 2016-12-18 DIAGNOSIS — R1310 Dysphagia, unspecified: Secondary | ICD-10-CM | POA: Diagnosis not present

## 2016-12-18 DIAGNOSIS — K219 Gastro-esophageal reflux disease without esophagitis: Secondary | ICD-10-CM | POA: Diagnosis not present

## 2016-12-18 NOTE — Progress Notes (Signed)
Pre visit review using our clinic review tool, if applicable. No additional management support is needed unless otherwise documented below in the visit note. 

## 2016-12-18 NOTE — Assessment & Plan Note (Signed)
Uncontrolled, for change prevacid to protonix 40

## 2016-12-18 NOTE — Progress Notes (Signed)
Subjective:    Patient ID: Keith Herrera, male    DOB: [birth year], 83 y.o.   MRN: [id number]  HPI  Here to f/u recent hospn with PTCA with cath after episode CP, now much improved.  Does have some swelling to the right wrist he will contact cardiology.  Pt denies chest pain, increased sob or doe, wheezing, orthopnea, PND, increased LE swelling, palpitations, dizziness or syncope. Pt denies new neurological symptoms such as new headache, or facial or extremity weakness or numbness   Pt denies polydipsia, polyuria, or low sugar symptoms.  Pt states overall good compliance with meds, trying to follow lower cholesterol, diabetic diet, wt overall down however with increased exercise however.   C/o worsening upper sternal area discomfrt intermittent assoc with occas difficult swallowing, with wt loss, but without n/v.   Has recurring reflux mild despite prevacid.    Due for a1c, has had blood work recently and wants to avoid other for now Wt Readings from Last 3 Encounters:  12/11/16 156 lb 8.4 oz (71 kg)  03/05/16 157 lb 6 oz (71.4 kg)  02/12/16 161 lb 5 oz (73.2 kg)   Past Medical History:  Diagnosis Date  . Adenomatous polyp of colon 05/2004  . Allergic rhinitis   . Anxiety   . BPH (benign prostatic hyperplasia)   . CAD (coronary artery disease)    s/p Cypher DES to LAD and pD1 2010;  LHC was done 6/12: EF 65%, circumflex patent, mid RCA 80-90% (small and nondominant), LAD and diagonal stents patent, LAD at the origin of the first diagonal 30%, ostial D2 90%, mid 80-90% (small vessel).  Continued medical therapy was recommended.   . Carotid stenosis    dopplers 02/2012: [id number] RICA; [id number] LICA  . Diabetes type 2, controlled ([place])   . Diverticulosis    colon  . ED (erectile dysfunction)   . Esophageal stricture   . GERD (gastroesophageal reflux disease)   . Hemorrhoids   . Hiatal hernia   . Hyperlipidemia   . Hypertension   . IBS (irritable bowel syndrome)   . Right inguinal hernia    s/p repair in 8/12  . SVT (supraventricular tachycardia) (HCC)    Past Surgical History:  Procedure Laterality Date  . CARDIAC CATHETERIZATION N/A 12/10/2016   Procedure: Left Heart Cath and Coronary Angiography;  Surgeon: Jettie Booze, MD;  Location: [place] [facility];  Service: Cardiovascular;  Laterality: N/A;  . CARDIAC CATHETERIZATION N/A 12/10/2016   Procedure: Coronary Balloon Angioplasty;  Surgeon: Jettie Booze, MD;  Location: [place] [facility];  Service: Cardiovascular;  Laterality: N/A;  . CARDIAC CATHETERIZATION N/A 12/10/2016   Procedure: Intravascular Pressure Wire/FFR Study;  Surgeon: Jettie Booze, MD;  Location: [place] [facility];  Service: Cardiovascular;  Laterality: N/A;  . CORONARY STENT PLACEMENT  10/2008   x 2  . ELECTROPHYSIOLOGIC STUDY N/A 03/20/2015   no inducible SVT - Dr Lovena Le  . INGUINAL HERNIA REPAIR Right    RIH with ultrapro patch    reports that he quit smoking about 60 years ago. His smoking use included Cigarettes. He has never used smokeless tobacco. He reports that he does not drink alcohol or use drugs. family history includes Bladder Cancer (age of onset: 86) in his mother; Healthy in his daughter and son; Heart attack (age of onset: 52) in his father; Pancreatic cancer (age of onset: 22) in his sister. Allergies  Allergen Reactions  . Crestor [Rosuvastatin Calcium] Rash  All over body  . Lovastatin Itching and Rash   Current Outpatient Prescriptions on File Prior to Visit  Medication Sig Dispense Refill  . amLODipine (NORVASC) 10 MG tablet Take 1 tablet (10 mg total) by mouth daily. 30 tablet 0  . aspirin EC 81 MG tablet Take 81 mg by mouth daily after supper.    Marland Kitchen atorvastatin (LIPITOR) 10 MG tablet TAKE 1 TABLET EVERY DAY 90 tablet 2  . brimonidine (ALPHAGAN) 0.2 % ophthalmic solution Place 1 drop into both eyes 3 (three) times daily.  6  . carvedilol (COREG) 6.25 MG tablet TAKE 1 TABLET (6.25 MG TOTAL) BY MOUTH 2 (TWO)  TIMES DAILY WITH A MEAL. 180 tablet 1  . clopidogrel (PLAVIX) 75 MG tablet Take 1 tablet (75 mg total) by mouth daily with breakfast. 30 tablet 0  . isosorbide dinitrate (ISORDIL) 10 MG tablet Take 1 tablet (10 mg total) by mouth 3 (three) times daily. 90 tablet 0  . lansoprazole (PREVACID) 30 MG capsule TAKE 1 CAPSULE (30 MG TOTAL) BY MOUTH DAILY AT 12 NOON. 90 capsule 3  . latanoprost (XALATAN) 0.005 % ophthalmic solution Place 1 drop into both eyes at bedtime.  12  . losartan (COZAAR) 100 MG tablet TAKE 1 TABLET EVERY DAY 90 tablet 3  . metFORMIN (GLUCOPHAGE) 500 MG tablet Take 500 mg by mouth 2 (two) times daily with a meal. Take 2 tabs by mouth in the am & 1 tab by mouth in the pm     . nitroGLYCERIN (NITROSTAT) 0.4 MG SL tablet Place 1 tablet (0.4 mg total) under the tongue every 5 (five) minutes as needed for chest pain. 20 tablet 0   No current facility-administered medications on file prior to visit.     Review of Systems  Constitutional: Negative for unusual diaphoresis or night sweats HENT: Negative for ear swelling or discharge Eyes: Negative for worsening visual haziness  Respiratory: Negative for choking and stridor.   Gastrointestinal: Negative for distension or worsening eructation Genitourinary: Negative for retention or change in urine volume.  Musculoskeletal: Negative for other MSK pain or swelling Skin: Negative for color change and worsening wound Neurological: Negative for tremors and numbness other than noted  Psychiatric/Behavioral: Negative for decreased concentration or agitation other than above   All other system neg per pt    Objective:   Physical Exam BP 138/76   Pulse 98   Temp 97.5 F (36.4 C) (Oral)   Resp 20   SpO2 96%  VS noted,  Constitutional: Pt appears in no apparent distress HENT: Head: NCAT.  Right Ear: External ear normal.  Left Ear: External ear normal.  Eyes: . Pupils are equal, round, and reactive to light. Conjunctivae and EOM are  normal Neck: Normal range of motion. Neck supple.  Cardiovascular: Normal rate and regular rhythm.   Pulmonary/Chest: Effort normal and breath sounds without rales or wheezing.  Neurological: Pt is alert. Not confused , motor grossly intact Right wrist with approx 1/4 cm small tender ? subq hematoma without new skin brusiing, but has older bruising now yellow across the wrist; hand o/w neurovasc intact Skin: Skin is warm. No rash, no LE edema Psychiatric: Pt behavior is normal. No agitation.  No other new exam findings  OCT - a1c - 7.3    Assessment & Plan:

## 2016-12-18 NOTE — Assessment & Plan Note (Signed)
stable overall by history and exam, recent data reviewed with pt, and pt to continue medical treatment as before,  to f/u any worsening symptoms or concerns BP Readings from Last 3 Encounters:  12/18/16 138/76  12/11/16 (!) 147/59  03/05/16 (!) 162/70

## 2016-12-18 NOTE — Assessment & Plan Note (Signed)
stable overall by history and exam, recent data reviewed with pt, and pt to continue medical treatment as before,  to f/u any worsening symptoms or concerns Lab Results  Component Value Date   HGBA1C 6.8 (H) 12/21/2015

## 2016-12-18 NOTE — Telephone Encounter (Addendum)
Pt states that he had a cardiac cath last week. This morning when he woke up he notice a knot  Above the cath site on his wrist, it was hard to touch and sore, ans it was a size of the tip of his thumb. Pt denies any redness nor is worm to touch.. Pt states the knot is gone down this afternoon. It is just a very small place now. Pt also C/O of his arm being sore  Pt had an appointment with his PCP,and show the site. The PCP recommended to cal his cardiologist. Dr. Angelena Form DOD aware of pt's symptoms . He recommends that because the swelling is going down , and symptoms  Are getting  better  for pt  to watch it overnight and call the office tomorrow if increase swelling or any changes. Pt verbalized understanding.   Called pt today 2/1 to see how his wrist post cath site was doing. Pt states that the site is fine no change in symptoms. Still the above the  cath site is hard to touch like yesterday no increased in swelling not redness or worm to touch. No  changes in symptoms. Pt is aware to call if needed.

## 2016-12-18 NOTE — Telephone Encounter (Signed)
Left pt a message to call back. 

## 2016-12-18 NOTE — Telephone Encounter (Signed)
Keith Herrera is calling because he had a cath and this morning when he woke up he has a knot right behind the place where the cath was done . Please call

## 2016-12-18 NOTE — Patient Instructions (Signed)
Your A1c was 7.3 today, which is OK for now given your recent weight loss  OK to stop the prevacid (lasoprazole)  Please take all new medication as prescribed - the protonix 40 mg  - once per day  You will be contacted regarding the referral for: Dr Justin Mend  Please call cardiology again about your right wrist swelling area.  Please continue all other medications as before, and refills have been done if requested.  Please have the pharmacy call with any other refills you may need.  Please keep your appointments with your specialists as you may have planned

## 2016-12-18 NOTE — Assessment & Plan Note (Addendum)
?   Etiology, assoc with some wt loss without appetite change, for change in PPI, also refer GI

## 2016-12-18 NOTE — Telephone Encounter (Signed)
Follow up   Pt verbalized that he is returning call to rn about previous call on cath issues

## 2016-12-24 ENCOUNTER — Encounter: Payer: Self-pay | Admitting: Cardiology

## 2016-12-24 ENCOUNTER — Ambulatory Visit (INDEPENDENT_AMBULATORY_CARE_PROVIDER_SITE_OTHER): Payer: Medicare HMO | Admitting: Cardiology

## 2016-12-24 VITALS — BP 118/60 | HR 60 | Ht 70.0 in | Wt 159.1 lb

## 2016-12-24 DIAGNOSIS — I2510 Atherosclerotic heart disease of native coronary artery without angina pectoris: Secondary | ICD-10-CM

## 2016-12-24 DIAGNOSIS — Z9861 Coronary angioplasty status: Secondary | ICD-10-CM

## 2016-12-24 NOTE — Progress Notes (Signed)
12/24/2016 Keith Herrera   [birth year]  [id number]  Primary Physician Cathlean Cower, MD Primary Cardiologist: Dr. Burt Knack  Electrophysiologist: Dr. Lovena Le   Reason for Visit/CC: [facility] f/u for CAD s/p PCI  HPI:  Keith Herrera presents to clinic today for post hospital f/u for CAD s/p recent coronary intervention.   His PMH is notable for CAD s/p previous PCI to LAD and D1 (2010), SVT, HTN and DMT2. He presented to [facility] on 12/08/16 with complaint of chest pain concerning for unstable angina. He was admitted and ruled out for MI with negative cardiac enzymes x 3. He underwent a NST which showed possible inferior ischemia and was low risk. Subsequently, he underwent a LHC on 12/10/16 per Dr. Irish Lack, which showed 100% occl mRCA with R--> L collaterals, patents stent in D1 and LAD, 75% occl D2 and 80% occl D1 stenosis just past prior stent which was treated with PCTA (a stent could not be delivered due to the prior LAD and diagional stents), 50% mLAD occl which was not significant by FFR. EF was vigorous  by echo at 65-70%. He was placed on DAPT with ASA + Plavix. He was continued on Lipitor, Coreg, amlodipine, Isordil and losartan.  He presents back to clinic today for f/u. He reports that he has done well from a symptom standpoint. He denies any recurrent CP. No dyspnea. No post cath complications. Ufortunately, he could not tolerate Isordil, due to hypotension, near syncope and HA. Since stopping it, his symptoms have resolved. His BP is stable in clinic today at 118/60. He reports full medication compliance with all other meds.   His main concern today is management of his glaucoma. He is followed by Dr. Thompson Grayer at [facility] and was scheduled to undergo surgery on 01/02/17. However, he would need to hold Plavix prior to the procedure. He is worried about postponing his surgery for fear of developing irreversible vision loss. He is very active for his age and is afraid that deteriorating vision  would affect quality of life.   Current Meds  Medication Sig  . amLODipine (NORVASC) 10 MG tablet Take 1 tablet (10 mg total) by mouth daily.  Marland Kitchen aspirin EC 81 MG tablet Take 81 mg by mouth daily after supper.  Marland Kitchen atorvastatin (LIPITOR) 10 MG tablet TAKE 1 TABLET EVERY DAY  . brimonidine (ALPHAGAN) 0.2 % ophthalmic solution Place 1 drop into both eyes 3 (three) times daily.  . carvedilol (COREG) 6.25 MG tablet TAKE 1 TABLET (6.25 MG TOTAL) BY MOUTH 2 (TWO) TIMES DAILY WITH A MEAL.  Marland Kitchen clopidogrel (PLAVIX) 75 MG tablet Take 1 tablet (75 mg total) by mouth daily with breakfast.  . latanoprost (XALATAN) 0.005 % ophthalmic solution Place 1 drop into both eyes at bedtime.  Marland Kitchen losartan (COZAAR) 100 MG tablet TAKE 1 TABLET EVERY DAY  . metFORMIN (GLUCOPHAGE) 500 MG tablet Take 500 mg by mouth 2 (two) times daily with a meal. Take 2 tabs by mouth in the am & 1 tab by mouth in the pm   . nitroGLYCERIN (NITROSTAT) 0.4 MG SL tablet Place 1 tablet (0.4 mg total) under the tongue every 5 (five) minutes as needed for chest pain.   Allergies  Allergen Reactions  . Crestor [Rosuvastatin Calcium] Rash    All over body  . Lovastatin Itching and Rash   Past Medical History:  Diagnosis Date  . Adenomatous polyp of colon 05/2004  . Allergic rhinitis   . Anxiety   . BPH (benign  prostatic hyperplasia)   . CAD (coronary artery disease)    s/p Cypher DES to LAD and pD1 2010;  LHC was done 6/12: EF 65%, circumflex patent, mid RCA 80-90% (small and nondominant), LAD and diagonal stents patent, LAD at the origin of the first diagonal 30%, ostial D2 90%, mid 80-90% (small vessel).  Continued medical therapy was recommended.   . Carotid stenosis    dopplers 02/2012: XX123456 RICA; 123456 LICA  . Diabetes type 2, controlled ([place])   . Diverticulosis    colon  . ED (erectile dysfunction)   . Esophageal stricture   . GERD (gastroesophageal reflux disease)   . Hemorrhoids   . Hiatal hernia   . Hyperlipidemia   .  Hypertension   . IBS (irritable bowel syndrome)   . Right inguinal hernia    s/p repair in 8/12  . SVT (supraventricular tachycardia) (HCC)    Family History  Problem Relation Age of Onset  . Bladder Cancer Mother 62  . Heart attack Father 75    Deceased  . Pancreatic cancer Sister 41  . Healthy Son   . Healthy Daughter    Past Surgical History:  Procedure Laterality Date  . CARDIAC CATHETERIZATION N/A 12/10/2016   Procedure: Left Heart Cath and Coronary Angiography;  Surgeon: Jettie Booze, MD;  Location: [place] [facility];  Service: Cardiovascular;  Laterality: N/A;  . CARDIAC CATHETERIZATION N/A 12/10/2016   Procedure: Coronary Balloon Angioplasty;  Surgeon: Jettie Booze, MD;  Location: [place] [facility];  Service: Cardiovascular;  Laterality: N/A;  . CARDIAC CATHETERIZATION N/A 12/10/2016   Procedure: Intravascular Pressure Wire/FFR Study;  Surgeon: Jettie Booze, MD;  Location: [place] [facility];  Service: Cardiovascular;  Laterality: N/A;  . CORONARY STENT PLACEMENT  10/2008   x 2  . ELECTROPHYSIOLOGIC STUDY N/A 03/20/2015   no inducible SVT - Dr Lovena Le  . INGUINAL HERNIA REPAIR Right    RIH with ultrapro patch   Social History   Social History  . Marital status: Married    Spouse name: N/A  . Number of children: 3  . Years of education: N/A   Occupational History  . 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 Smoking status: Former Smoker    Types: Cigarettes    Quit date: 03/05/1956  . Smokeless tobacco: Never Used     Comment: smoked in teens  . Alcohol use No  . Drug use: No  . Sexual activity: Not on file   Other Topics Concern  . Not on file   Social History Narrative   Lives with wife in a 3 story home.  Has 2 living children.  1 passed away in a car accident.     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       Review of Systems: General:  negative for chills, fever, night sweats or weight changes.  Cardiovascular: negative for chest pain, dyspnea on exertion, edema, orthopnea, palpitations, paroxysmal nocturnal dyspnea or shortness of breath Dermatological: negative for rash Respiratory: negative for cough or wheezing Urologic: negative for hematuria Abdominal: negative for nausea, vomiting, diarrhea, bright red blood per rectum, melena, or hematemesis Neurologic: negative for visual changes, syncope, or dizziness All other systems reviewed and are otherwise negative except as noted above.   Physical Exam:  Blood pressure 118/60, pulse 60, height 5\' 10"  (1.778 m), weight 159 lb 1.9 oz (  72.2 kg).  General appearance: alert, cooperative and no distress Neck: no carotid bruit and no JVD Lungs: clear to auscultation bilaterally Heart: regular rate and rhythm, S1, S2 normal, no murmur, click, rub or gallop Extremities: extremities normal, atraumatic, no cyanosis or edema Pulses: 2+ and symmetric Skin: Skin color, texture, turgor normal. No rashes or lesions Neurologic: Grossly normal  EKG not performed   ASSESSMENT AND PLAN:   1. CAD: as outlined above, s/p balloon angioplasty only of an 80% occl D1 stenosis. No stent placed. EF normal. He denies any recurrent angina. Continue ASA + Plavix, statin, BB, amlodipine and ARB. He did not tolerate LA nitrate. HR and BP are both stable. No post cath complications. 2+ right radial pulse.   2. Bilateral Glaucoma: His main concern today is management of his glaucoma. He is followed by Dr. Thompson Grayer at [facility] and was scheduled to undergo surgery on 01/02/17. However, he would need to hold Plavix prior to the procedure.  I discussed with him that this will likely need to be delayed given recent PCI and need for uninterrupted DAPT. He is worried about postponing his surgery for fear of developing irreversible vision loss. He is very active for his age and is afraid that deteriorating  vision would affect quality of life. I will route this encounter to Dr. Burt Knack for review to help weigh in regarding duration of DAPT before Plavix can be held for procedure. We may be able to stop Plavix, temporarily, after 1 month, given he did not receive a stent.    PLAN  F/u with Dr. Burt Knack in 2-3 months.   Brittainy Simmons PA-C 12/24/2016 2:42 PM

## 2016-12-24 NOTE — Patient Instructions (Addendum)
Medication Instructions:   Your physician recommends that you continue on your current medications as directed. Please refer to the Current Medication list given to you today.   If you need a refill on your cardiac medications before your next appointment, please call your pharmacy.  Labwork: NONE ORDERED  TODAY    Testing/Procedures: NONE ORDERED  TODAY    Follow-Up: WITH [facility] IN 3 MONTHS    Any Other Special Instructions Will Be Listed Below (If Applicable).

## 2016-12-26 ENCOUNTER — Ambulatory Visit (INDEPENDENT_AMBULATORY_CARE_PROVIDER_SITE_OTHER): Payer: Medicare HMO | Admitting: Nurse Practitioner

## 2016-12-26 ENCOUNTER — Encounter: Payer: Self-pay | Admitting: Nurse Practitioner

## 2016-12-26 VITALS — BP 100/58 | HR 59 | Ht 71.0 in | Wt 160.0 lb

## 2016-12-26 DIAGNOSIS — K5909 Other constipation: Secondary | ICD-10-CM | POA: Diagnosis not present

## 2016-12-26 DIAGNOSIS — R0789 Other chest pain: Secondary | ICD-10-CM

## 2016-12-26 NOTE — Patient Instructions (Addendum)
If you are age 81 or older, your body mass index should be between 23-30. Your Body mass index is 22.32 kg/m. If this is out of the aforementioned range listed, please consider follow up with your Primary Care Provider.  If you are age 80 or younger, your body mass index should be between 19-25. Your Body mass index is 22.32 kg/m. If this is out of the aformentioned range listed, please consider follow up with your Primary Care Provider.   Please use Citrucel daily.  Use Miralax as needed.  Please discontinue artificial sweeteners and carbonated drinks.  Please follow up with Keith Herrera in 3 weeks. Your appointment is scheduled for January 20, 2017 at 9:00am.  Thank you.

## 2016-12-26 NOTE — Progress Notes (Signed)
HPI: Patient is an 83 year old male known to Dr. Fuller Plan. He has history of adenomatous colon polyps. Patient has multiple medical problems not limited to coronary artery disease status post stenting in 2010, DM 2 , and hypertension. Patient is here today as requested by PCP for evaluation of GERD. Patient was hospitalized in late January with chest pain . He underwent cardiac catheter with PCTA .   Patient has been on Prevacid for years for history of GERD. He has no heartburn but over the last 3-4 weeks has developed some vague chest pressure relieved with belching as well as Gas-X. Patient has a difficult time explaining the feeling in his chest but does describe a fluttery sensation. No globus. No dysphagia. His appetite is great.  He consumes  carbonated beverages, artificial sweeteners, broccolii and cauliflower.    Past Medical History:  Diagnosis Date  . Adenomatous polyp of colon 05/2004  . Allergic rhinitis   . Anxiety   . BPH (benign prostatic hyperplasia)   . CAD (coronary artery disease)    s/p Cypher DES to LAD and pD1 2010;  LHC was done 6/12: EF 65%, circumflex patent, mid RCA 80-90% (small and nondominant), LAD and diagonal stents patent, LAD at the origin of the first diagonal 30%, ostial D2 90%, mid 80-90% (small vessel).  Continued medical therapy was recommended.   . Carotid stenosis    dopplers 02/2012: XX123456 RICA; [id number] LICA  . Diabetes type 2, controlled ([place])   . Diverticulosis    colon  . ED (erectile dysfunction)   . Esophageal stricture   . GERD (gastroesophageal reflux disease)   . Hemorrhoids   . Hiatal hernia   . Hyperlipidemia   . Hypertension   . IBS (irritable bowel syndrome)   . Right inguinal hernia    s/p repair in 8/12  . SVT (supraventricular tachycardia) (HCC)      Past Surgical History:  Procedure Laterality Date  . CARDIAC CATHETERIZATION N/A 12/10/2016   Procedure: Left Heart Cath and Coronary Angiography;  Surgeon: Jettie Booze, MD;  Location: [place] [facility];  Service: Cardiovascular;  Laterality: N/A;  . CARDIAC CATHETERIZATION N/A 12/10/2016   Procedure: Coronary Balloon Angioplasty;  Surgeon: Jettie Booze, MD;  Location: [place] [facility];  Service: Cardiovascular;  Laterality: N/A;  . CARDIAC CATHETERIZATION N/A 12/10/2016   Procedure: Intravascular Pressure Wire/FFR Study;  Surgeon: Jettie Booze, MD;  Location: [place] [facility];  Service: Cardiovascular;  Laterality: N/A;  . CORONARY STENT PLACEMENT  10/2008   x 2  . ELECTROPHYSIOLOGIC STUDY N/A 03/20/2015   no inducible SVT - Dr Lovena Le  . INGUINAL HERNIA REPAIR Right    RIH with ultrapro patch   Family History  Problem Relation Age of Onset  . Bladder Cancer Mother 33  . Heart attack Father 41    Deceased  . Pancreatic cancer Sister 3  . Healthy Son   . Healthy Daughter    Social History  Substance Use Topics  . Smoking status: Former Smoker    Types: Cigarettes    Quit date: 03/05/1956  . Smokeless tobacco: Never Used     Comment: smoked in teens  . Alcohol use No   Current Outpatient Prescriptions  Medication Sig Dispense Refill  . amLODipine (NORVASC) 10 MG tablet Take 1 tablet (10 mg total) by mouth daily. 30 tablet 0  . aspirin EC 81 MG tablet Take 81 mg by mouth daily after supper.    Marland Kitchen  atorvastatin (LIPITOR) 10 MG tablet TAKE 1 TABLET EVERY DAY 90 tablet 2  . brimonidine (ALPHAGAN) 0.2 % ophthalmic solution Place 1 drop into both eyes 3 (three) times daily.  6  . carvedilol (COREG) 6.25 MG tablet TAKE 1 TABLET (6.25 MG TOTAL) BY MOUTH 2 (TWO) TIMES DAILY WITH A MEAL. 180 tablet 1  . clopidogrel (PLAVIX) 75 MG tablet Take 1 tablet (75 mg total) by mouth daily with breakfast. 30 tablet 0  . lansoprazole (PREVACID) 30 MG capsule Take 30 mg by mouth daily at 12 noon.    . latanoprost (XALATAN) 0.005 % ophthalmic solution Place 1 drop into both eyes at bedtime.  12  . losartan (COZAAR) 100 MG tablet TAKE 1 TABLET  EVERY DAY 90 tablet 3  . metFORMIN (GLUCOPHAGE) 500 MG tablet Take 500 mg by mouth 2 (two) times daily with a meal. Take 2 tabs by mouth in the am & 1 tab by mouth in the pm     . nitroGLYCERIN (NITROSTAT) 0.4 MG SL tablet Place 1 tablet (0.4 mg total) under the tongue every 5 (five) minutes as needed for chest pain. 20 tablet 0   No current facility-administered medications for this visit.    Allergies  Allergen Reactions  . Crestor [Rosuvastatin Calcium] Rash    All over body  . Lovastatin Itching and Rash    Review of Systems: All systems reviewed and negative except where noted in HPI.    Physical Exam: BP (!) 100/58   Pulse (!) 59   Ht 5\' 11"  (1.803 m)   Wt 160 lb (72.6 kg)   SpO2 98%   BMI 22.32 kg/m  Constitutional:  Well-developed, white male in no acute distress. Psychiatric: Normal mood and affect. Behavior is normal. HEENT: Normocephalic and atraumatic. Conjunctivae are normal. No scleral icterus. Neck supple.  Cardiovascular: Normal rate, regular rhythm.  Pulmonary/chest: Effort normal and breath sounds normal. No wheezing, rales or rhonchi. Abdominal: Soft, nondistended, nontender. Bowel sounds active throughout. There are no masses palpable. No hepatomegaly. Extremities: no edema Lymphadenopathy: No cervical adenopathy noted. Neurological: Alert and oriented to person place and time. Skin: Skin is warm and dry. No rashes noted.   ASSESSMENT AND PLAN:  69. 83 year old male with vague chest fluttering / pressure relieved with burping as well as Gas-X. He is on a chronic PPI. No significant heartburn.  -Elimination of gas seems to be helpful . We discussed low gas diet. Additionally he will stop artificial sweeteners and carbonated beverages for now.  I gave him a low gas diet brochure. I plan to see him back in a few weeks unless symptoms resolved.   2. Constipation. Recently started taking MiraLAX every day and had loose stool yesterday. Recommended Citrucel  daily and MiraLAX to use as needed..   3. CAD, recent cath with PCTA. On plavix / asa  [name], NP  12/26/2016, 2:26 PM

## 2016-12-27 ENCOUNTER — Telehealth: Payer: Self-pay | Admitting: Cardiology

## 2016-12-27 NOTE — Telephone Encounter (Signed)
Keith Herrera is requesting a call back in regards to his last visit with [place]. Thanks.

## 2016-12-27 NOTE — Telephone Encounter (Signed)
Patient was notified of Dr. Antionette Char recommendation to take ASA and Plavix for 4 weeks before holding his Plavix for surgery. He was instructed to call Dr. Lindalou Hose to reschedule his surgery. Patient verbalized understanding.

## 2016-12-27 NOTE — Telephone Encounter (Signed)
He should have 4 weeks of ASA, plavix, then can hold plavix for surgery

## 2016-12-27 NOTE — Telephone Encounter (Signed)
The patient is calling regarding his visit with Lyda Jester, PA on 12/24/16. The patient is suppose to have eye surgery for his glaucoma on 01/02/17 with Dr. Thompson Grayer at [facility] for which he will need to hold his Plavix. He was told at his appointment that the encounter would be sent to Dr. Burt Knack for review to help weigh in regarding duration of DAPT before Plavix can be held for procedure. He has not heard anything back and wants to know if he can stop his Plavix for the procedure or if the procedure needs to be rescheduled. He has only been taking his Plavix for 2 weeks. Please advise.

## 2016-12-30 DIAGNOSIS — H4010X0 Unspecified open-angle glaucoma, stage unspecified: Secondary | ICD-10-CM | POA: Insufficient documentation

## 2017-01-01 ENCOUNTER — Telehealth: Payer: Self-pay | Admitting: Cardiovascular Disease

## 2017-01-01 DIAGNOSIS — R002 Palpitations: Secondary | ICD-10-CM

## 2017-01-01 NOTE — Telephone Encounter (Signed)
I spoke with the pt and he complains of feeling his heart skipping and it taking his breath away for a split second. Today has been a better day but this occurs 2-3 times per day and has been noticeable since his catheterization.  The pt does monitor his BP and he states that at times it gets low around 100/50.  I asked the pt what his heart rate has been and he said he does not know how to read that on his BP monitor.  The pt does walk on a treadmill and when starting his pulse it is in upper 60s and with exercise it goes to 125-130.  The pt does have a history of SVT and has seen Dr Lovena Le in the past. EP study was previously attempted by SVT could not be induced. I will discuss this pt with Dr Lovena Le.

## 2017-01-01 NOTE — Telephone Encounter (Signed)
I discussed this pt with Dr Lovena Le and he recommends that the pt wear a 48 hour holter monitor. Order placed in the system and pt aware that a scheduler will contact him with an appointment.

## 2017-01-01 NOTE — Telephone Encounter (Signed)
New message      Pt states he had a cath 3wks ago.  Every since the cath, he has been having heart fluttering.  Is this normal?  Please advise

## 2017-01-03 ENCOUNTER — Ambulatory Visit (INDEPENDENT_AMBULATORY_CARE_PROVIDER_SITE_OTHER): Payer: Medicare HMO

## 2017-01-03 DIAGNOSIS — R002 Palpitations: Secondary | ICD-10-CM

## 2017-01-09 ENCOUNTER — Other Ambulatory Visit: Payer: Self-pay | Admitting: Cardiology

## 2017-01-09 ENCOUNTER — Other Ambulatory Visit: Payer: Self-pay | Admitting: Internal Medicine

## 2017-01-09 ENCOUNTER — Other Ambulatory Visit: Payer: Self-pay

## 2017-01-09 MED ORDER — CLOPIDOGREL BISULFATE 75 MG PO TABS: 75.0000 mg | ORAL_TABLET | Freq: Every day | ORAL | 11 refills | Status: DC

## 2017-01-09 MED ORDER — AMLODIPINE BESYLATE 10 MG PO TABS: 10.0000 mg | ORAL_TABLET | Freq: Every day | ORAL | 11 refills | Status: DC

## 2017-01-09 NOTE — Telephone Encounter (Signed)
[facility] on [address] called and left a message to refill Metformin, Plavix, and Isosorbide for pt.

## 2017-01-17 ENCOUNTER — Telehealth (HOSPITAL_COMMUNITY): Payer: Self-pay | Admitting: Internal Medicine

## 2017-01-17 NOTE — Telephone Encounter (Signed)
Insurance benefits verified. Patient has [employer] Medicare: $10.00 co-payment, no deductible, out of pocket $5900/$145.00 has been met, no co-insurance, no pre-authorization and no limit on visit. Passport/reference [phone]. Information given to [facility] for review.

## 2017-01-20 ENCOUNTER — Encounter: Payer: Self-pay | Admitting: Nurse Practitioner

## 2017-01-20 ENCOUNTER — Ambulatory Visit (INDEPENDENT_AMBULATORY_CARE_PROVIDER_SITE_OTHER): Payer: Medicare HMO | Admitting: Nurse Practitioner

## 2017-01-20 VITALS — BP 122/72 | HR 57 | Ht 68.25 in | Wt 164.0 lb

## 2017-01-20 DIAGNOSIS — R142 Eructation: Secondary | ICD-10-CM

## 2017-01-20 DIAGNOSIS — K219 Gastro-esophageal reflux disease without esophagitis: Secondary | ICD-10-CM

## 2017-01-20 DIAGNOSIS — Z86010 Personal history of colonic polyps: Secondary | ICD-10-CM

## 2017-01-20 NOTE — Progress Notes (Signed)
HPI: Patient is an 83 yo male known to Dr. Fuller Plan. He has a history of colon adenomas. I saw the patient earlier this month for evaluation of chest pressure relieved with burping and Gas-X.. He does have coronary artery disease, is s/p PCTA late January and is on Plavix and aspirin. I gave him a low gas diet, asked him to stop artificial sweeteners and carbonated beverages to see if it made any difference.  He also had complaints of constipation, had recently started daily MiraLAX which resulted in loose stools. I recommended he start Citrucel daily and use MiraLAX only as needed. He is here for follow-up as recommended  Mr. Angus hasn't had any further chest fluttering or chest pressure. The burping is better and no longer requires gas-x. He stopped the carbonated beverages. He is going for eye surgery later this month.    Past Medical History:  Diagnosis Date  . Adenomatous polyp of colon 05/2004  . Allergic rhinitis   . Anxiety   . BPH (benign prostatic hyperplasia)   . CAD (coronary artery disease)    s/p Cypher DES to LAD and pD1 2010;  LHC was done 6/12: EF 65%, circumflex patent, mid RCA 80-90% (small and nondominant), LAD and diagonal stents patent, LAD at the origin of the first diagonal 30%, ostial D2 90%, mid 80-90% (small vessel).  Continued medical therapy was recommended.   . Carotid stenosis    dopplers 02/2012: XX123456 RICA; [id number] LICA  . Diabetes type 2, controlled ([place])   . Diverticulosis    colon  . ED (erectile dysfunction)   . Esophageal stricture   . GERD (gastroesophageal reflux disease)   . Hemorrhoids   . Hiatal hernia   . Hyperlipidemia   . Hypertension   . IBS (irritable bowel syndrome)   . Right inguinal hernia    s/p repair in 8/12  . SVT (supraventricular tachycardia) (HCC)     Patient's surgical history, family medical history, social history, medications and allergies were all reviewed in Epic    Physical Exam: BP 122/72   Pulse (!) 57   Ht  5' 8.25" (1.734 m)   Wt 164 lb (74.4 kg)   SpO2 97%   BMI 24.75 kg/m   GENERAL: well developed white male in NAD PSYCH: :Pleasant, cooperative, normal affect EENT: conjunctiva pink, mucous membranes moist, neck supple without masses CARDIAC:  RRR, no peripheral edema PULM: Normal respiratory effort, lungs CTA bilaterally, no wheezing ABDOMEN:  soft, nontender, nondistended, no obvious masses,  normal bowel sounds SKIN:  turgor, no lesions seen Musculoskeletal:  Normal muscle tone, normal strength NEURO: Alert and oriented x 3, no focal neurologic deficits   ASSESSMENT and PLAN:  95. 83 year old male for follow-up of excessive belching/chest discomfort. Since omission of carbonated beverages the belching has resolved. No longer requiring Gas-X.   Patient read about Prevacid side effects. He got concerned about long term use and reduced dosage to once every other day. So far no heartburn symptoms. -Advised patient to take Maalox or Pepcid if he has GERD symptoms on days he doesn't take Prevacid  2. Constipation, resolved. He did try the Citrucel for couple of days. Following that bowel started moving well but since then he has been managing well with wheat bran.  3. Hx of colon polyps. Last colonoscopy with removal of tubular adenomatous colon polyps was 2009. He is overdue for surveillance, according to EPIC a recall letter was sent to him in 2014. Patient  is 83 yo but feels he is able and would like to continue surveillance program if we feel appropriate. Will see if Dr. Fuller Plan wants to continue surveillance colonoscopy and let patient now. If so, I don't think a colonoscopy would be done for at least a few months given cardiac stent placement late January.    [name] , NP 01/20/2017, 9:08 AM

## 2017-01-20 NOTE — Patient Instructions (Addendum)
   Glad your doing well.   We will contact you if a colonoscopy is needed after discussing this matter with Dr Fuller Plan.    I appreciate the opportunity to care for you.

## 2017-01-21 NOTE — Progress Notes (Signed)
Reviewed and agree with initial management plan. Per 02/2016 office visit we decided to discontinue surveillance colonoscopies due to his age.   Pricilla Riffle. Fuller Plan, MD [facility]

## 2017-01-22 NOTE — Progress Notes (Signed)
Beth, please let patient know that I spoke to Dr. Fuller Plan. They decided at his last visit that no surveillance colonoscopies would be done. So unless having problems then no plans to schedule it. Thanks

## 2017-01-23 ENCOUNTER — Telehealth: Payer: Self-pay

## 2017-01-23 NOTE — Telephone Encounter (Signed)
-----   Message from Willia Craze, NP sent at 01/22/2017  5:03 PM EST -----   ----- Message ----- From: Ladene Artist, MD Sent: 01/21/2017   8:46 AM To: Willia Craze, NP    ----- Message ----- From: Willia Craze, NP Sent: 01/20/2017   5:01 PM To: Ladene Artist, MD

## 2017-01-23 NOTE — Telephone Encounter (Signed)
Patient is advised.  

## 2017-01-24 DIAGNOSIS — Z01818 Encounter for other preprocedural examination: Secondary | ICD-10-CM | POA: Diagnosis not present

## 2017-01-28 ENCOUNTER — Ambulatory Visit: Payer: Medicare HMO | Admitting: Gastroenterology

## 2017-01-30 DIAGNOSIS — H401123 Primary open-angle glaucoma, left eye, severe stage: Secondary | ICD-10-CM | POA: Diagnosis not present

## 2017-01-30 DIAGNOSIS — E119 Type 2 diabetes mellitus without complications: Secondary | ICD-10-CM | POA: Diagnosis not present

## 2017-01-30 DIAGNOSIS — Z7984 Long term (current) use of oral hypoglycemic drugs: Secondary | ICD-10-CM | POA: Diagnosis not present

## 2017-01-30 DIAGNOSIS — Z7982 Long term (current) use of aspirin: Secondary | ICD-10-CM | POA: Diagnosis not present

## 2017-01-30 DIAGNOSIS — I2510 Atherosclerotic heart disease of native coronary artery without angina pectoris: Secondary | ICD-10-CM | POA: Diagnosis not present

## 2017-01-30 DIAGNOSIS — Z87891 Personal history of nicotine dependence: Secondary | ICD-10-CM | POA: Diagnosis not present

## 2017-01-30 DIAGNOSIS — Z79899 Other long term (current) drug therapy: Secondary | ICD-10-CM | POA: Diagnosis not present

## 2017-01-30 DIAGNOSIS — I10 Essential (primary) hypertension: Secondary | ICD-10-CM | POA: Diagnosis not present

## 2017-01-30 DIAGNOSIS — K219 Gastro-esophageal reflux disease without esophagitis: Secondary | ICD-10-CM | POA: Diagnosis not present

## 2017-02-04 ENCOUNTER — Telehealth (HOSPITAL_COMMUNITY): Payer: Self-pay | Admitting: Internal Medicine

## 2017-02-04 NOTE — Telephone Encounter (Signed)
I called and left message on patient voicemail to call office and let me know if he would like to participate and schedule for the Cardiac Rehab program. I left my contact information for patient to return my call.

## 2017-02-25 ENCOUNTER — Telehealth (HOSPITAL_COMMUNITY): Payer: Self-pay | Admitting: Internal Medicine

## 2017-02-25 NOTE — Telephone Encounter (Signed)
I called and spoke with patient about scheduling and participating in the Cardiac Rehab program. Patient declined services from Cardiac Rehab. Patient stated that he has his own equipment and works out from home. Referral canceled.

## 2017-02-27 ENCOUNTER — Other Ambulatory Visit: Payer: Self-pay | Admitting: Internal Medicine

## 2017-03-12 DIAGNOSIS — H269 Unspecified cataract: Secondary | ICD-10-CM | POA: Insufficient documentation

## 2017-03-16 ENCOUNTER — Other Ambulatory Visit: Payer: Self-pay | Admitting: Internal Medicine

## 2017-04-01 ENCOUNTER — Other Ambulatory Visit: Payer: Self-pay | Admitting: Internal Medicine

## 2017-04-07 ENCOUNTER — Ambulatory Visit (INDEPENDENT_AMBULATORY_CARE_PROVIDER_SITE_OTHER): Payer: Medicare HMO | Admitting: Cardiovascular Disease

## 2017-04-07 ENCOUNTER — Encounter: Payer: Self-pay | Admitting: Cardiovascular Disease

## 2017-04-07 VITALS — BP 118/50 | HR 52 | Ht 71.0 in | Wt 162.6 lb

## 2017-04-07 DIAGNOSIS — I10 Essential (primary) hypertension: Secondary | ICD-10-CM

## 2017-04-07 MED ORDER — AMLODIPINE BESYLATE 5 MG PO TABS: 5.0000 mg | ORAL_TABLET | Freq: Every day | ORAL | 11 refills | Status: DC

## 2017-04-07 NOTE — Patient Instructions (Signed)
Medication Instructions:  Your physician has recommended you make the following change in your medication:  1. STOP Plavix (clopidogrel) 2. DECREASE Amlodipine to 5mg  take one tablet by mouth daily  Labwork: No new orders.   Testing/Procedures: No new orders.   Follow-Up: Your physician wants you to follow-up in: 6 MONTHS with Dr Burt Knack.  You will receive a reminder letter in the mail two months in advance. If you don't receive a letter, please call our office to schedule the follow-up appointment.   Any Other Special Instructions Will Be Listed Below (If Applicable).     If you need a refill on your cardiac medications before your next appointment, please call your pharmacy.

## 2017-04-07 NOTE — Progress Notes (Signed)
Cardiology Office Note Date:  04/07/2017   ID:  ADEBAYO ENSMINGER, DOB 1932-10-07, MRN [id number]  PCP:  [name], MD  Cardiologist:  Sherren Mocha, MD    Chief Complaint  Patient presents with  . Follow-up     History of Present Illness: Keith Herrera is a 83 y.o. male who presents for Follow-up of coronary artery disease. He's been treated with previous stenting of the LAD and diagonal back in 2010. He also has a history of SVT, hypertension, and type 2 diabetes. The patient presented in January 2018 with chest pain and palpitations. A stress test showed inferior ischemia. He underwent cardiac catheterization demonstrating patency of his previous stent sites, but severe stenosis in the first diagonal treated with balloon angioplasty alone. There was moderate stenosis in the LAD which was interrogated with pressure wire analysis and this was negative for ischemia. His LV function has been normal. He was treated initially with aspirin and clopidogrel, but the patient discontinued clopidogrel because he needed eye surgery for treatment of glaucoma. He did well with surgery and had no complications.  Complains of some fatigue. Otherwise doing very well. He just had glaucoma surgery recently and has been off of plavix. He has not had any recent chest pain. He denies shortness of breath. 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  Past Medical History:  Diagnosis Date  . Adenomatous polyp of colon 05/2004  . Allergic rhinitis   . Anxiety   . BPH (benign prostatic hyperplasia)   . CAD (coronary artery disease)    s/p Cypher DES to LAD and pD1 2010;  LHC was done 6/12: EF 65%, circumflex patent, mid RCA 80-90% (small and nondominant), LAD and diagonal stents patent, LAD at the origin of the first diagonal 30%, ostial D2 90%, mid 80-90% (small vessel).  Continued medical therapy was recommended.   . Carotid stenosis    dopplers 02/2012: 7-06% RICA; 23-76% LICA  . Diabetes  type 2, controlled ([place])   . Diverticulosis    colon  . ED (erectile dysfunction)   . Esophageal stricture   . GERD (gastroesophageal reflux disease)   . Hemorrhoids   . Hiatal hernia   . Hyperlipidemia   . Hypertension   . IBS (irritable bowel syndrome)   . Right inguinal hernia    s/p repair in 8/12  . SVT (supraventricular tachycardia) (HCC)     Past Surgical History:  Procedure Laterality Date  . CARDIAC CATHETERIZATION N/A 12/10/2016   Procedure: Left Heart Cath and Coronary Angiography;  Surgeon: Jettie Booze, MD;  Location: [facility];  Service: Cardiovascular;  Laterality: N/A;  . CARDIAC CATHETERIZATION N/A 12/10/2016   Procedure: Coronary Balloon Angioplasty;  Surgeon: Jettie Booze, MD;  Location: [facility];  Service: Cardiovascular;  Laterality: N/A;  . CARDIAC CATHETERIZATION N/A 12/10/2016   Procedure: Intravascular Pressure Wire/FFR Study;  Surgeon: Jettie Booze, MD;  Location: [facility];  Service: Cardiovascular;  Laterality: N/A;  . CORONARY STENT PLACEMENT  10/2008   x 2  . ELECTROPHYSIOLOGIC STUDY N/A 03/20/2015   no inducible SVT - Dr Lovena Le  . INGUINAL HERNIA REPAIR Right    RIH with ultrapro patch    Current Outpatient Prescriptions  Medication Sig Dispense Refill  . amLODipine (NORVASC) 10 MG tablet Take 1 tablet (10 mg total) by mouth daily. 30 tablet 11  . aspirin EC 81 MG tablet Take 81 mg by mouth daily  after supper.    Marland Kitchen atorvastatin (LIPITOR) 10 MG tablet Take 1 tablet (10 mg total) by mouth daily. 30 tablet 1  . carvedilol (COREG) 6.25 MG tablet TAKE 1 TABLET (6.25 MG TOTAL) BY MOUTH 2 (TWO) TIMES DAILY WITH A MEAL. 180 tablet 1  . lansoprazole (PREVACID) 30 MG capsule Take 30 mg by mouth every other day.     . latanoprost (XALATAN) 0.005 % ophthalmic solution Place 1 drop into both eyes at bedtime.  12  . losartan (COZAAR) 100 MG tablet Take 100 mg by mouth daily.    . metFORMIN (GLUCOPHAGE) 500 MG tablet  Take 1,000 mg by mouth daily with breakfast. Take '500mg'$  by mouth in the evening    . nitroGLYCERIN (NITROSTAT) 0.4 MG SL tablet Place 1 tablet (0.4 mg total) under the tongue every 5 (five) minutes as needed for chest pain. 20 tablet 0   No current facility-administered medications for this visit.     Allergies:   Crestor [rosuvastatin calcium] and Lovastatin   Social History:  The patient  reports that he quit smoking about 61 years ago. His smoking use included Cigarettes. He has never used smokeless tobacco. He reports that he does not drink alcohol or use drugs.   Family History:  The patient's  family history includes Bladder Cancer (age of onset: 2) in his mother; Healthy in his daughter and son; Heart attack (age of onset: 70) in his father; Pancreatic cancer (age of onset: 28) in his sister.    ROS:  Please see the history of present illness.  Otherwise, review of systems is positive for vision changes and generalized fatigue and weakness.  All other systems are reviewed and negative.    PHYSICAL EXAM: VS:  BP (!) 118/50   Pulse (!) 52   Ht '5\' 11"'$  (1.803 m)   Wt 162 lb 9.6 oz (73.8 kg)   BMI 22.68 kg/m  , BMI Body mass index is 22.68 kg/m. GEN: Well nourished, well developed, elderly gentleman in no acute distress  HEENT: normal  Neck: no JVD, no masses. No carotid bruits Cardiac: RRR without murmur or gallop                Respiratory:  clear to auscultation bilaterally, normal work of breathing GI: soft, nontender, nondistended, + BS MS: no deformity or atrophy  Ext: no pretibial edema, pedal pulses 2+= bilaterally Skin: warm and dry, no rash Neuro:  Strength and sensation are intact Psych: euthymic mood, full affect  EKG:  EKG is not ordered today.  Recent Labs: 12/11/2016: BUN 15; Creatinine, Ser 1.12; Hemoglobin 14.1; Platelets 156; Potassium 3.6; Sodium 139   Lipid Panel     Component Value Date/Time   CHOL 117 12/21/2015 1044   TRIG 102.0 12/21/2015 1044    TRIG 128 11/19/2006 1032   HDL 33.60 (L) 12/21/2015 1044   CHOLHDL 3 12/21/2015 1044   VLDL 20.4 12/21/2015 1044   LDLCALC 63 12/21/2015 1044   LDLDIRECT 102.0 12/13/2014 1344     Wt Readings from Last 3 Encounters:  04/07/17 162 lb 9.6 oz (73.8 kg)  01/20/17 164 lb (74.4 kg)  12/26/16 160 lb (72.6 kg)    Cardiac Studies Reviewed: Cardiac Cath 12/10/2016: Procedural Details/Technique   Technical Details The risks, benefits, and details of the procedure were explained to the patient. The patient verbalized understanding and wanted to proceed. Informed written consent was obtained.  PROCEDURE TECHNIQUE: After Xylocaine anesthesia a 34F slender sheath was placed in the  right radial artery with a single anterior needle wall stick. IV Heparin was given. Left heart cath was done using a JR4 catheter. Right coronary angiography was done using a Judkins R4 guide catheter. Left coronary angiography was done using a Judkins L3.5 guide catheter. A TR band was used for hemostasis.  PCI of the diagonal was performed. Angiomax for anticoagulation.  FFR of the LAD was then performed with IV adenosine for maximal hyperemia. ACIST catheter was used, equalized at the tip of the guide and then advanced across the lesion. Adenosine given. Results as noted in the conclusions.  Contrast: 90 cc    Estimated blood loss <50 mL.  During this procedure the patient was administered the following to achieve and maintain moderate conscious sedation: Versed 1 mg, Fentanyl 25 mcg, while the patient's heart rate, blood pressure, and oxygen saturation were continuously monitored. The period of conscious sedation was 53 minutes, of which I was present face-to-face 100% of this time.    Complications   Complications documented before study signed (12/10/2016 3:04 PM EST)    No complications were associated with this study.  Documented by Jettie Booze, MD - 12/10/2016 3:00 PM EST    Coronary Findings    Dominance: Left  Left Anterior Descending  Prox LAD to Mid LAD lesion, 0% stenosed. Previously placed Prox LAD to Mid LAD drug eluting stent is widely patent.  First Diagonal Branch  Vessel is small in size.  Ost 1st Diag to 1st Diag lesion, 0% stenosed. Previously placed Ost 1st Diag to 1st Diag drug eluting stent is widely patent.  1st Diag lesion, 80% stenosed.  Angioplasty: Lesion crossed with guidewire using a WIRE ASAHI PROWATER 180CM. Angioplasty alone was performed using a BALLOON MOZEC 2.0X12. Maximum pressure: 10 atm. The pre-interventional distal flow is normal (TIMI 3). The post-interventional distal flow is normal (TIMI 3). The intervention was successful . No complications occurred at this lesion. Unable to deliver 2.25 Promus stent through the prior stented area.  There is a 10% residual stenosis post intervention.  Second Diagonal Branch  Vessel is small in size.  Ost 2nd Diag to 2nd Diag lesion, 75% stenosed.  2nd Diag lesion, 75% stenosed.  Left Circumflex  The vessel exhibits minimal luminal irregularities.  Right Coronary Artery  Vessel is small. Dist RCA filled by collaterals from Acute Mrg.  Mid RCA lesion, 100% stenosed. The lesion is chronically occluded with left-to-right and right-to-right collateral flow.  Right Posterior Descending Artery  RPDA filled by collaterals from Dist LAD.  Left Heart   Left Ventricle LV end diastolic pressure is normal.    Aortic Valve There is no aortic valve stenosis.    Coronary Diagrams   Diagnostic Diagram       Post-Intervention Diagram         ASSESSMENT AND PLAN: 1.  Coronary artery disease, native vessel: Patient is doing well with no symptoms of angina. His current medical program is reviewed and will be continued. I went back and looked through all of his records from January when he underwent PCI. He did not have true evidence of ACS. He was treated with balloon angioplasty alone. I think he can remain off of  Plavix and just continue on antiplatelet therapy with aspirin at this point. His other medications are reviewed and no changes are recommended today.  2. Hypertension: Pressure is controlled, but the patient has had some increased fatigue. His blood pressure is low especially considering his age over 28. I  recommended that he decrease amlodipine to 5 mg daily.  3. Hyperlipidemia: The patient is treated with atorvastatin.  4. Type II DM: treated with metformin. Followed by PCP.   5. Palpitations: reviewed recent holter monitor demonstrating periods of bradycardia. Continue observation.   Current medicines are reviewed with the patient today.  The patient does not have concerns regarding medicines.  Labs/ tests ordered today include:  No orders of the defined types were placed in this encounter.   Disposition:   FU 6 months  Signed, Sherren Mocha, MD  04/07/2017 10:52 AM    [facility] [address], [place], [place]  [address] Phone: [phone]; Fax: [phone]

## 2017-04-10 DIAGNOSIS — I10 Essential (primary) hypertension: Secondary | ICD-10-CM | POA: Diagnosis not present

## 2017-04-10 DIAGNOSIS — E119 Type 2 diabetes mellitus without complications: Secondary | ICD-10-CM | POA: Diagnosis not present

## 2017-04-10 DIAGNOSIS — Z7984 Long term (current) use of oral hypoglycemic drugs: Secondary | ICD-10-CM | POA: Diagnosis not present

## 2017-04-10 DIAGNOSIS — Z7982 Long term (current) use of aspirin: Secondary | ICD-10-CM | POA: Diagnosis not present

## 2017-04-10 DIAGNOSIS — Z79899 Other long term (current) drug therapy: Secondary | ICD-10-CM | POA: Diagnosis not present

## 2017-04-10 DIAGNOSIS — Z7902 Long term (current) use of antithrombotics/antiplatelets: Secondary | ICD-10-CM | POA: Diagnosis not present

## 2017-04-10 DIAGNOSIS — H401113 Primary open-angle glaucoma, right eye, severe stage: Secondary | ICD-10-CM | POA: Diagnosis not present

## 2017-04-10 DIAGNOSIS — H401123 Primary open-angle glaucoma, left eye, severe stage: Secondary | ICD-10-CM | POA: Diagnosis not present

## 2017-04-10 DIAGNOSIS — Z87891 Personal history of nicotine dependence: Secondary | ICD-10-CM | POA: Diagnosis not present

## 2017-04-10 DIAGNOSIS — H2511 Age-related nuclear cataract, right eye: Secondary | ICD-10-CM | POA: Diagnosis not present

## 2017-05-14 ENCOUNTER — Ambulatory Visit (INDEPENDENT_AMBULATORY_CARE_PROVIDER_SITE_OTHER): Payer: Medicare HMO | Admitting: Internal Medicine

## 2017-05-14 ENCOUNTER — Encounter: Payer: Self-pay | Admitting: Internal Medicine

## 2017-05-14 VITALS — BP 146/64 | HR 63 | Temp 97.8°F | Resp 16 | Wt 164.0 lb

## 2017-05-14 DIAGNOSIS — L509 Urticaria, unspecified: Secondary | ICD-10-CM | POA: Diagnosis not present

## 2017-05-14 MED ORDER — PREDNISONE 10 MG PO TABS: ORAL_TABLET | ORAL | 0 refills | Status: DC

## 2017-05-14 MED ORDER — METHYLPREDNISOLONE ACETATE 80 MG/ML IJ SUSP
80.0000 mg | Freq: Once | INTRAMUSCULAR | Status: AC
  Administered 2017-05-14: 80 mg via INTRAMUSCULAR

## 2017-05-14 NOTE — Progress Notes (Signed)
Subjective:    Patient ID: Keith Herrera, male    DOB: 1932-07-14, 83 y.o.   MRN: [id number]  HPI He is here for an acute visit.   Hives:  He developed welts a few days ago and he has not been able to sleep for a few nights.  He has had this for years intermittently and he is unsure what the cause is.  He has been staying inside recently and denies any new exposures or foods.  He has not seen a link with any specific food.  He denies sore throat, trouble swallowing, cough, wheeze and SOB.  He has tried a few things over the counter and they have not helped.      Medications and allergies reviewed with patient and updated if appropriate.  Patient Active Problem List   Diagnosis Date Noted  . Dysphagia 12/18/2016  . Unstable angina pectoris ([place])   . Abnormal nuclear stress test   . Chest pain 12/08/2016  . Cough 02/06/2016  . Wheezing 02/06/2016  . Dizziness 01/22/2016  . Itching 12/21/2015  . Lower back pain 11/24/2015  . SVT (supraventricular tachycardia) ([place]) 01/31/2015  . Type 2 diabetes mellitus with vascular disease ([place]) 01/31/2015  . Carotid bruit 01/15/2012  . Preventative health care 07/01/2011  . BENIGN PROSTATIC HYPERTROPHY 06/26/2010  . CAD, NATIVE VESSEL 11/02/2008  . INGUINAL HERNIA, RIGHT 10/28/2008  . ANXIETY 10/28/2007  . ESOPHAGEAL STRICTURE 10/28/2007  . NEPHROLITHIASIS, HX OF 10/28/2007  . Hyperlipidemia 07/22/2007  . ERECTILE DYSFUNCTION 07/22/2007  . Essential hypertension 07/22/2007  . Allergic rhinitis 07/22/2007  . GERD 07/22/2007  . DIVERTICULOSIS, COLON 07/22/2007  . IBS 07/22/2007  . COLONIC POLYPS, HX OF 07/22/2007  . HEMORRHOIDS, HX OF 07/22/2007    Current Outpatient Prescriptions on File Prior to Visit  Medication Sig Dispense Refill  . amLODipine (NORVASC) 5 MG tablet Take 1 tablet (5 mg total) by mouth daily. 30 tablet 11  . aspirin EC 81 MG tablet Take 81 mg by mouth daily after supper.    Marland Kitchen atorvastatin (LIPITOR) 10 MG  tablet Take 1 tablet (10 mg total) by mouth daily. 30 tablet 1  . carvedilol (COREG) 6.25 MG tablet TAKE 1 TABLET (6.25 MG TOTAL) BY MOUTH 2 (TWO) TIMES DAILY WITH A MEAL. 180 tablet 1  . lansoprazole (PREVACID) 30 MG capsule Take 30 mg by mouth every other day.     . latanoprost (XALATAN) 0.005 % ophthalmic solution Place 1 drop into both eyes at bedtime.  12  . losartan (COZAAR) 100 MG tablet Take 100 mg by mouth daily.    . metFORMIN (GLUCOPHAGE) 500 MG tablet Take 1,000 mg by mouth daily with breakfast. Take 500mg  by mouth in the evening    . nitroGLYCERIN (NITROSTAT) 0.4 MG SL tablet Place 1 tablet (0.4 mg total) under the tongue every 5 (five) minutes as needed for chest pain. 20 tablet 0   No current facility-administered medications on file prior to visit.     Past Medical History:  Diagnosis Date  . Adenomatous polyp of colon 05/2004  . Allergic rhinitis   . Anxiety   . BPH (benign prostatic hyperplasia)   . CAD (coronary artery disease)    s/p Cypher DES to LAD and pD1 2010;  LHC was done 6/12: EF 65%, circumflex patent, mid RCA 80-90% (small and nondominant), LAD and diagonal stents patent, LAD at the origin of the first diagonal 30%, ostial D2 90%, mid 80-90% (small vessel).  Continued medical  therapy was recommended.   . Carotid stenosis    dopplers 02/2012: 1-02% RICA; 58-52% LICA  . Diabetes type 2, controlled ([place])   . Diverticulosis    colon  . ED (erectile dysfunction)   . Esophageal stricture   . GERD (gastroesophageal reflux disease)   . Hemorrhoids   . Hiatal hernia   . Hyperlipidemia   . Hypertension   . IBS (irritable bowel syndrome)   . Right inguinal hernia    s/p repair in 8/12  . SVT (supraventricular tachycardia) ([facility])     Past Surgical History:  Procedure Laterality Date  . CARDIAC CATHETERIZATION N/A 12/10/2016   Procedure: Left Heart Cath and Coronary Angiography;  Surgeon: Jettie Booze, MD;  Location: [facility];  Service:  Cardiovascular;  Laterality: N/A;  . CARDIAC CATHETERIZATION N/A 12/10/2016   Procedure: Coronary Balloon Angioplasty;  Surgeon: Jettie Booze, MD;  Location: [facility];  Service: Cardiovascular;  Laterality: N/A;  . CARDIAC CATHETERIZATION N/A 12/10/2016   Procedure: Intravascular Pressure Wire/FFR Study;  Surgeon: Jettie Booze, MD;  Location: [facility];  Service: Cardiovascular;  Laterality: N/A;  . CORONARY STENT PLACEMENT  10/2008   x 2  . ELECTROPHYSIOLOGIC STUDY N/A 03/20/2015   no inducible SVT - Dr Lovena Le  . INGUINAL HERNIA REPAIR Right    RIH with ultrapro patch    Social History   Social History  . Marital status: Married    Spouse name: N/A  . Number of children: 3  . Years of education: N/A   Occupational History  . 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 Smoking status: Former Smoker    Types: Cigarettes    Quit date: 03/05/1956  . Smokeless tobacco: Never Used     Comment: smoked in teens  . Alcohol use No  . Drug use: No  . Sexual activity: Not Asked   Other Topics Concern  . None   Social History Narrative   Lives with wife in a 3 story home.  Has 2 living children.  1 passed away in a car accident.     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      Family History  Problem Relation Age of Onset  . Bladder Cancer Mother 54  . Heart attack Father 47       Deceased  . Pancreatic cancer Sister 5  . Healthy Son   . Healthy Daughter     Review of Systems  Constitutional: Negative for chills and fever.  HENT: Negative for sore throat and trouble swallowing.   Cardiovascular: Negative for chest pain, palpitations and leg swelling.  Skin:       hives  Neurological: Positive for headaches (from eyes). Negative for light-headedness.       Objective:   Vitals:   05/14/17 1438  BP: (!) 146/64  Pulse: 63  Resp: 16  Temp: 97.8 F  (36.6 C)   Filed Weights   05/14/17 1438  Weight: 164 lb (74.4 kg)   Body mass index is 22.87 kg/m.  Wt Readings from Last 3 Encounters:  05/14/17 164 lb (74.4 kg)  04/07/17 162 lb 9.6 oz (73.8 kg)  01/20/17 164 lb (74.4 kg)     Physical Exam  Constitutional: He appears well-developed and well-nourished. No distress.  HENT:  Head: Normocephalic and atraumatic.  Mouth/Throat: Oropharynx is clear and moist.  No oropharyngeal exudate.  Cardiovascular: Normal rate and regular rhythm.   Pulmonary/Chest: Effort normal and breath sounds normal. No respiratory distress. He has no wheezes. He has no rales.  Skin: Skin is warm and dry. He is not diaphoretic.  Hives throughout body, no blisters or open wounds          Assessment & Plan:   See Problem List for Assessment and Plan of chronic medical problems.

## 2017-05-14 NOTE — Patient Instructions (Signed)
You received a steroid injection today.   Starting tomorrow take the prednisone as prescribed.    Start taking zyrtec allergy medication at night - take for at least two weeks.     Call if no improvement      Hives Hives (urticaria) are itchy, red, swollen areas on your skin. Hives can appear on any part of your body and can vary in size. They can be as small as the tip of a pen or much larger. Hives often fade within 24 hours (acute hives). In other cases, new hives appear after old ones fade. This cycle can continue for several days or weeks (chronic hives). Hives result from your body's reaction to an irritant or to something that you are allergic to (trigger). When you are exposed to a trigger, your body releases a chemical (histamine) that causes redness, itching, and swelling. You can get hives immediately after being exposed to a trigger or hours later. Hives do not spread from person to person (are not contagious). Your hives may get worse with scratching, exercise, and emotional stress. What are the causes? Causes of this condition include:  Allergies to certain foods or ingredients.  Insect bites or stings.  Exposure to pollen or pet dander.  Contact with latex or chemicals.  Spending time in sunlight, heat, or cold (exposure).  Exercise.  Stress.  You can also get hives from some medical conditions and treatments. These include:  Viruses, including the common cold.  Bacterial infections, such as urinary tract infections and strep throat.  Disorders such as vasculitis, lupus, or thyroid disease.  Certain medications.  Allergy shots.  Blood transfusions.  Sometimes, the cause of hives is not known (idiopathic hives). What increases the risk? This condition is more likely to develop in:  Women.  People who have food allergies, especially to citrus fruits, milk, eggs, peanuts, tree nuts, or shellfish.  People who are allergic  to: ? Medicines. ? Latex. ? Insects. ? Animals. ? Pollen.  People who have certain medical conditions, includinglupus or thyroid disease.  What are the signs or symptoms? The main symptom of this condition is raised, itchyred or white bumps or patches on your skin. These areas may:  Become large and swollen (welts).  Change in shape and location, quickly and repeatedly.  Be separate hives or connect over a large area of skin.  Sting or become painful.  Turn white when pressed in the center (blanch).  In severe cases, yourhands, feet, and face may also become swollen. This may occur if hives develop deeper in your skin. How is this diagnosed? This condition is diagnosed based on your symptoms, medical history, and physical exam. Your skin, urine, or blood may be tested to find out what is causing your hives and to rule out other health issues. Your health care provider may also remove a small sample of skin from the affected area and examine it under a microscope (biopsy). How is this treated? Treatment depends on the severity of your condition. Your health care provider may recommend using cool, wet cloths (cool compresses) or taking cool showers to relieve itching. Hives are sometimes treated with medicines, including:  Antihistamines.  Corticosteroids.  Antibiotics.  An injectable medicine (omalizumab). Your health care provider may prescribe this if you have chronic idiopathic hives and you continue to have symptoms even after treatment with antihistamines.  Severe cases may require an emergency injection of adrenaline (epinephrine) to prevent a life-threatening allergic reaction (anaphylaxis). Follow these instructions at  home: Medicines  Take or apply over-the-counter and prescription medicines only as told by your health care provider.  If you were prescribed an antibiotic medicine, use it as told by your health care provider. Do not stop taking the antibiotic even  if you start to feel better. Skin Care  Apply cool compresses to the affected areas.  Do not scratch or rub your skin. General instructions  Do not take hot showers or baths. This can make itching worse.  Do not wear tight-fitting clothing.  Use sunscreen and wear protective clothing when you are outside.  Avoid any substances that cause your hives. Keep a journal to help you track what causes your hives. Write down: ? What medicines you take. ? What you eat and drink. ? What products you use on your skin.  Keep all follow-up visits as told by your health care provider. This is important. Contact a health care provider if:  Your symptoms are not controlled with medicine.  Your joints are painful or swollen. Get help right away if:  You have a fever.  You have pain in your abdomen.  Your tongue or lips are swollen.  Your eyelids are swollen.  Your chest or throat feels tight.  You have trouble breathing or swallowing. These symptoms may represent a serious problem that is an emergency. Do not wait to see if the symptoms will go away. Get medical help right away. Call your local emergency services (911 in the [place] Do not drive yourself to the hospital. This information is not intended to replace advice given to you by your health care provider. Make sure you discuss any questions you have with your health care provider. Document Released: 11/04/2005 Document Revised: 04/03/2016 Document Reviewed: 08/23/2015 Elsevier Interactive Patient Education  2017 [employer].

## 2017-05-14 NOTE — Assessment & Plan Note (Signed)
Whole body hives - very itchy, not responding to otc meds Mod-severe in nature Depo-medrol 80 mg IM x1 Prednisone taper starting tomorrow Zyrtec daily for at least 2 weeks Deferred seeing an allergies Call if no improvement

## 2017-05-15 ENCOUNTER — Other Ambulatory Visit: Payer: Self-pay | Admitting: Internal Medicine

## 2017-05-15 ENCOUNTER — Telehealth: Payer: Self-pay | Admitting: Internal Medicine

## 2017-05-15 NOTE — Telephone Encounter (Signed)
Pt called stating he had a eye operation and uses cortisone drops 4x a day to put  Into your eyes,  He wants to know if it is okay to take Cortisone while also taking predniSONE (DELTASONE) 10 MG tablet

## 2017-05-15 NOTE — Telephone Encounter (Signed)
Spoke with pt's wife to inform.  

## 2017-05-15 NOTE — Telephone Encounter (Signed)
Yes ok to take both

## 2017-05-30 DIAGNOSIS — H2512 Age-related nuclear cataract, left eye: Secondary | ICD-10-CM | POA: Insufficient documentation

## 2017-05-31 ENCOUNTER — Other Ambulatory Visit: Payer: Self-pay | Admitting: Internal Medicine

## 2017-06-11 ENCOUNTER — Ambulatory Visit (INDEPENDENT_AMBULATORY_CARE_PROVIDER_SITE_OTHER): Payer: Medicare HMO | Admitting: Internal Medicine

## 2017-06-11 ENCOUNTER — Encounter: Payer: Self-pay | Admitting: Internal Medicine

## 2017-06-11 VITALS — BP 140/70 | HR 69 | Ht 71.0 in | Wt 152.0 lb

## 2017-06-11 DIAGNOSIS — I10 Essential (primary) hypertension: Secondary | ICD-10-CM | POA: Diagnosis not present

## 2017-06-11 DIAGNOSIS — Z0000 Encounter for general adult medical examination without abnormal findings: Secondary | ICD-10-CM

## 2017-06-11 DIAGNOSIS — E1159 Type 2 diabetes mellitus with other circulatory complications: Secondary | ICD-10-CM | POA: Diagnosis not present

## 2017-06-11 DIAGNOSIS — E785 Hyperlipidemia, unspecified: Secondary | ICD-10-CM | POA: Diagnosis not present

## 2017-06-11 LAB — POCT GLYCOSYLATED HEMOGLOBIN (HGB A1C): Hemoglobin A1C: 9.7

## 2017-06-11 MED ORDER — LINAGLIPTIN 5 MG PO TABS: 5.0000 mg | ORAL_TABLET | Freq: Every day | ORAL | 3 refills | Status: DC

## 2017-06-11 MED ORDER — METFORMIN HCL 1000 MG PO TABS: 1000.0000 mg | ORAL_TABLET | Freq: Two times a day (BID) | ORAL | 3 refills | Status: DC

## 2017-06-11 NOTE — Patient Instructions (Signed)
Your A1c was 9.7 today  OK to increase the metformin 1000 mg to twice per day  Please take all new medication as prescribed - the Tradjenta 5 mg per day  Please have the pharmacy call us with a similar medication to [place] if this is not covered with your insurance  Please call if you have worsening diaarrhea or stomach upset with the metformin increase  Please continue all other medications as before, and refills have been done if requested.  Please have the pharmacy call with any other refills you may need.  Please keep your appointments with your specialists as you may have planned  Please go to the LAB in the Basement (turn left off the elevator) for the tests to be done tomorrow or the next day  You will be contacted by phone if any changes need to be made immediately.  Otherwise, you will receive a letter about your results with an explanation, but please check with MyChart first.  Please remember to sign up for MyChart if you have not done so, as this will be important to you in the future with finding out test results, communicating by private email, and scheduling acute appointments online when needed.  Please return in 3 months, or sooner if needed

## 2017-06-11 NOTE — Progress Notes (Signed)
Subjective:    Patient ID: Keith Herrera, male    DOB: 11-05-1932, 83 y.o.   MRN: [id number]  HPI  Here to f/u with recent worsening blood sugars;   Sugars have been 200's with polys and recent wt loss.  Denies change in diet or activity except maybe slowing down a bit overall.  Trying to follow DM diet.   Pt denies chest pain, increasing sob or doe, wheezing, orthopnea, PND, increased LE swelling, palpitations, dizziness or syncope.  Pt denies new neurological symptoms such as new headache, or facial or extremity weakness or numbness.  Pt denies polydipsia, polyuria  S/p glaucoma surgury recently.  Pt denies fever, night sweats, loss of appetite, or other constitutional symptoms Wt Readings from Last 3 Encounters:  06/11/17 152 lb (68.9 kg)  05/14/17 164 lb (74.4 kg)  04/07/17 162 lb 9.6 oz (73.8 kg)   Past Medical History:  Diagnosis Date  . Adenomatous polyp of colon 05/2004  . Allergic rhinitis   . Anxiety   . BPH (benign prostatic hyperplasia)   . CAD (coronary artery disease)    s/p Cypher DES to LAD and pD1 2010;  LHC was done 6/12: EF 65%, circumflex patent, mid RCA 80-90% (small and nondominant), LAD and diagonal stents patent, LAD at the origin of the first diagonal 30%, ostial D2 90%, mid 80-90% (small vessel).  Continued medical therapy was recommended.   . Carotid stenosis    dopplers 02/2012: 4-09% RICA; 81-19% LICA  . Diabetes type 2, controlled ([place])   . Diverticulosis    colon  . ED (erectile dysfunction)   . Esophageal stricture   . GERD (gastroesophageal reflux disease)   . Hemorrhoids   . Hiatal hernia   . Hyperlipidemia   . Hypertension   . IBS (irritable bowel syndrome)   . Right inguinal hernia    s/p repair in 8/12  . SVT (supraventricular tachycardia) (HCC)    Past Surgical History:  Procedure Laterality Date  . CARDIAC CATHETERIZATION N/A 12/10/2016   Procedure: Left Heart Cath and Coronary Angiography;  Surgeon: Jettie Booze, MD;  Location:  [facility];  Service: Cardiovascular;  Laterality: N/A;  . CARDIAC CATHETERIZATION N/A 12/10/2016   Procedure: Coronary Balloon Angioplasty;  Surgeon: Jettie Booze, MD;  Location: [facility];  Service: Cardiovascular;  Laterality: N/A;  . CARDIAC CATHETERIZATION N/A 12/10/2016   Procedure: Intravascular Pressure Wire/FFR Study;  Surgeon: Jettie Booze, MD;  Location: [facility];  Service: Cardiovascular;  Laterality: N/A;  . CORONARY STENT PLACEMENT  10/2008   x 2  . ELECTROPHYSIOLOGIC STUDY N/A 03/20/2015   no inducible SVT - Dr Lovena Le  . INGUINAL HERNIA REPAIR Right    RIH with ultrapro patch    reports that he quit smoking about 61 years ago. His smoking use included Cigarettes. He has never used smokeless tobacco. He reports that he does not drink alcohol or use drugs. family history includes Bladder Cancer (age of onset: 24) in his mother; Healthy in his daughter and son; Heart attack (age of onset: 6) in his father; Pancreatic cancer (age of onset: 76) in his sister. Allergies  Allergen Reactions  . Crestor [Rosuvastatin Calcium] Rash    All over body  . Lovastatin Itching and Rash   Current Outpatient Prescriptions on File Prior to Visit  Medication Sig Dispense Refill  . amLODipine (NORVASC) 5 MG tablet Take 1 tablet (5 mg total) by mouth daily. 30 tablet 11  .  aspirin EC 81 MG tablet Take 81 mg by mouth daily after supper.    Marland Kitchen atorvastatin (LIPITOR) 10 MG tablet Take 1 tablet (10 mg total) by mouth daily. Follow-up appt is due must see provider for refills need lipid check 30 tablet 0  . carvedilol (COREG) 6.25 MG tablet TAKE 1 TABLET (6.25 MG TOTAL) BY MOUTH 2 (TWO) TIMES DAILY WITH A MEAL. 180 tablet 0  . lansoprazole (PREVACID) 30 MG capsule Take 30 mg by mouth every other day.     . latanoprost (XALATAN) 0.005 % ophthalmic solution Place 1 drop into both eyes at bedtime.  12  . losartan (COZAAR) 100 MG tablet Take 100 mg by mouth daily.      . nitroGLYCERIN (NITROSTAT) 0.4 MG SL tablet Place 1 tablet (0.4 mg total) under the tongue every 5 (five) minutes as needed for chest pain. 20 tablet 0   No current facility-administered medications on file prior to visit.    Review of Systems  Constitutional: Negative for other unusual diaphoresis or sweats HENT: Negative for ear discharge or swelling Eyes: Negative for other worsening visual disturbances Respiratory: Negative for stridor or other swelling  Gastrointestinal: Negative for worsening distension or other blood Genitourinary: Negative for retention or other urinary change Musculoskeletal: Negative for other MSK pain or swelling Skin: Negative for color change or other new lesions Neurological: Negative for worsening tremors and other numbness  Psychiatric/Behavioral: Negative for worsening agitation or other fatigue All other system neg per pt    Objective:   Physical Exam BP 140/70   Pulse 69   Ht 5\' 11"  (1.803 m)   Wt 152 lb (68.9 kg)   SpO2 98%   BMI 21.20 kg/m  VS noted, not ill appearing Constitutional: Pt appears in NAD HENT: Head: NCAT.  Right Ear: External ear normal.  Left Ear: External ear normal.  Eyes: . Pupils are equal, round, and reactive to light. Conjunctivae and EOM are normal Nose: without d/c or deformity Neck: Neck supple. Gross normal ROM Cardiovascular: Normal rate and regular rhythm.   Pulmonary/Chest: Effort normal and breath sounds without rales or wheezing.  Neurological: Pt is alert. At baseline orientation, motor grossly intact Skin: Skin is warm. No rashes, other new lesions, no LE edema Psychiatric: Pt behavior is normal without agitation  No other exam findings  POCT glycosylated hemoglobin (Hb A1C)  Order: [id number]  Status:  Final result Visible to patient:  No (Not Released) Dx:  Type 2 diabetes mellitus with vascula...  Component 18:35  Hemoglobin A1C 9.7            Assessment & Plan:

## 2017-06-12 ENCOUNTER — Ambulatory Visit: Payer: Medicare HMO | Admitting: Internal Medicine

## 2017-06-12 ENCOUNTER — Other Ambulatory Visit (INDEPENDENT_AMBULATORY_CARE_PROVIDER_SITE_OTHER): Payer: Medicare HMO

## 2017-06-12 DIAGNOSIS — E1159 Type 2 diabetes mellitus with other circulatory complications: Secondary | ICD-10-CM | POA: Diagnosis not present

## 2017-06-12 LAB — LDL CHOLESTEROL, DIRECT: LDL DIRECT: 76 mg/dL

## 2017-06-12 LAB — LIPID PANEL
CHOLESTEROL: 134 mg/dL (ref 0–200)
HDL: 32.7 mg/dL — ABNORMAL LOW (ref >39.00)
NonHDL: 100.82
Total CHOL/HDL Ratio: 4
Triglycerides: 282 mg/dL — ABNORMAL HIGH (ref 0.0–149.0)
VLDL: 56.4 mg/dL — AB (ref 0.0–40.0)

## 2017-06-12 LAB — HEPATIC FUNCTION PANEL
ALBUMIN: 3.7 g/dL (ref 3.5–5.2)
ALK PHOS: 44 U/L (ref 39–117)
ALT: 23 U/L (ref 0–53)
AST: 14 U/L (ref 0–37)
BILIRUBIN TOTAL: 0.7 mg/dL (ref 0.2–1.2)
Bilirubin, Direct: 0.1 mg/dL (ref 0.0–0.3)
Total Protein: 6.2 g/dL (ref 6.0–8.3)

## 2017-06-12 LAB — BASIC METABOLIC PANEL
BUN: 22 mg/dL (ref 6–23)
CHLORIDE: 102 meq/L (ref 96–112)
CO2: 26 mEq/L (ref 19–32)
Calcium: 9 mg/dL (ref 8.4–10.5)
Creatinine, Ser: 1.01 mg/dL (ref 0.40–1.50)
GFR: 74.94 mL/min (ref >60.00)
GLUCOSE: 345 mg/dL — AB (ref 70–99)
POTASSIUM: 4.4 meq/L (ref 3.5–5.1)
SODIUM: 137 meq/L (ref 135–145)

## 2017-06-13 ENCOUNTER — Ambulatory Visit: Payer: Medicare HMO | Admitting: Internal Medicine

## 2017-06-14 NOTE — Assessment & Plan Note (Signed)
stable overall by history and exam, recent data reviewed with pt, and pt to continue medical treatment as before,  to f/u any worsening symptoms or concerns BP Readings from Last 3 Encounters:  06/11/17 140/70  05/14/17 (!) 146/64  04/07/17 (!) 118/50

## 2017-06-14 NOTE — Assessment & Plan Note (Signed)
Lab Results  Component Value Date   LDLCALC 63 12/21/2015  stable overall by history and exam, recent data reviewed with pt, and pt to continue medical treatment as before,  to f/u any worsening symptoms or concerns 

## 2017-06-14 NOTE — Assessment & Plan Note (Signed)
Uncontrolled, to increase the metformin to 1000 bid, add tradjenta 5 qd, cont DM diet and checking CBG's, to call for persistent cbg > 200

## 2017-06-29 ENCOUNTER — Other Ambulatory Visit: Payer: Self-pay | Admitting: Internal Medicine

## 2017-07-01 ENCOUNTER — Other Ambulatory Visit: Payer: Self-pay | Admitting: Internal Medicine

## 2017-07-15 ENCOUNTER — Ambulatory Visit (INDEPENDENT_AMBULATORY_CARE_PROVIDER_SITE_OTHER): Payer: Medicare HMO | Admitting: Internal Medicine

## 2017-07-15 ENCOUNTER — Encounter: Payer: Self-pay | Admitting: Internal Medicine

## 2017-07-15 VITALS — BP 144/72 | HR 65 | Temp 98.1°F | Resp 16 | Wt 160.0 lb

## 2017-07-15 DIAGNOSIS — L509 Urticaria, unspecified: Secondary | ICD-10-CM | POA: Diagnosis not present

## 2017-07-15 MED ORDER — METHYLPREDNISOLONE ACETATE 80 MG/ML IJ SUSP
80.0000 mg | Freq: Once | INTRAMUSCULAR | Status: AC
  Administered 2017-07-15: 80 mg via INTRAMUSCULAR

## 2017-07-15 MED ORDER — PREDNISONE 10 MG PO TABS: ORAL_TABLET | ORAL | 0 refills | Status: DC

## 2017-07-15 NOTE — Patient Instructions (Signed)
You received a steroid injection today.   Starting tomorrow take the prednisone as prescribed.    Start taking zyrtec allergy medication at night - continue long term to help prevent other episodes.     Call if no improvement

## 2017-07-15 NOTE — Progress Notes (Signed)
Subjective:    Patient ID: Keith Herrera, male    DOB: 1932-04-11, 83 y.o.   MRN: [id number]  HPI He is here for an acute visit.   Hives:  It started about two days ago. He has hives on his arms, legs an right side of his abdomen.  He has had several episodes over the years and his last one was in June.  He ate some cheese a few nights ago and wondered if that was the cause.  His last episode of hives was in June.  That was successfully treated and he was good until a few days ago.    He denies fever, chills, sore or swelling in his throat, trouble swallowing,  Cough, wheeze or SOB.    Medications and allergies reviewed with patient and updated if appropriate.  Patient Active Problem List   Diagnosis Date Noted  . Dysphagia 12/18/2016  . Unstable angina pectoris ([place])   . Abnormal nuclear stress test   . Chest pain 12/08/2016  . Cough 02/06/2016  . Wheezing 02/06/2016  . Dizziness 01/22/2016  . Itching 12/21/2015  . Lower back pain 11/24/2015  . SVT (supraventricular tachycardia) ([place]) 01/31/2015  . Type 2 diabetes mellitus with vascular disease ([place]) 01/31/2015  . Hives 02/22/2013  . Carotid bruit 01/15/2012  . Preventative health care 07/01/2011  . BENIGN PROSTATIC HYPERTROPHY 06/26/2010  . CAD, NATIVE VESSEL 11/02/2008  . INGUINAL HERNIA, RIGHT 10/28/2008  . ANXIETY 10/28/2007  . ESOPHAGEAL STRICTURE 10/28/2007  . NEPHROLITHIASIS, HX OF 10/28/2007  . Hyperlipidemia 07/22/2007  . ERECTILE DYSFUNCTION 07/22/2007  . Essential hypertension 07/22/2007  . Allergic rhinitis 07/22/2007  . GERD 07/22/2007  . DIVERTICULOSIS, COLON 07/22/2007  . IBS 07/22/2007  . COLONIC POLYPS, HX OF 07/22/2007  . HEMORRHOIDS, HX OF 07/22/2007    Current Outpatient Prescriptions on File Prior to Visit  Medication Sig Dispense Refill  . amLODipine (NORVASC) 5 MG tablet Take 1 tablet (5 mg total) by mouth daily. 30 tablet 11  . aspirin EC 81 MG tablet Take 81 mg by mouth daily after  supper.    Marland Kitchen atorvastatin (LIPITOR) 10 MG tablet Take 1 tablet (10 mg total) by mouth daily at 6 PM. 30 tablet 5  . carvedilol (COREG) 6.25 MG tablet TAKE 1 TABLET (6.25 MG TOTAL) BY MOUTH 2 (TWO) TIMES DAILY WITH A MEAL. 180 tablet 0  . lansoprazole (PREVACID) 30 MG capsule Take 30 mg by mouth every other day.     . lansoprazole (PREVACID) 30 MG capsule TAKE 1 CAPSULE (30 MG TOTAL) BY MOUTH DAILY AT 12 NOON. 90 capsule 3  . latanoprost (XALATAN) 0.005 % ophthalmic solution Place 1 drop into both eyes at bedtime.  12  . linagliptin (TRADJENTA) 5 MG TABS tablet Take 1 tablet (5 mg total) by mouth daily. 90 tablet 3  . losartan (COZAAR) 100 MG tablet Take 100 mg by mouth daily.    . metFORMIN (GLUCOPHAGE) 1000 MG tablet Take 1 tablet (1,000 mg total) by mouth 2 (two) times daily with a meal. 180 tablet 3  . nitroGLYCERIN (NITROSTAT) 0.4 MG SL tablet Place 1 tablet (0.4 mg total) under the tongue every 5 (five) minutes as needed for chest pain. 20 tablet 0   No current facility-administered medications on file prior to visit.     Past Medical History:  Diagnosis Date  . Adenomatous polyp of colon 05/2004  . Allergic rhinitis   . Anxiety   . BPH (benign prostatic hyperplasia)   .  CAD (coronary artery disease)    s/p Cypher DES to LAD and pD1 2010;  LHC was done 6/12: EF 65%, circumflex patent, mid RCA 80-90% (small and nondominant), LAD and diagonal stents patent, LAD at the origin of the first diagonal 30%, ostial D2 90%, mid 80-90% (small vessel).  Continued medical therapy was recommended.   . Carotid stenosis    dopplers 02/2012: 3-29% RICA; 92-42% LICA  . Diabetes type 2, controlled ([place])   . Diverticulosis    colon  . ED (erectile dysfunction)   . Esophageal stricture   . GERD (gastroesophageal reflux disease)   . Hemorrhoids   . Hiatal hernia   . Hyperlipidemia   . Hypertension   . IBS (irritable bowel syndrome)   . Right inguinal hernia    s/p repair in 8/12  . SVT  (supraventricular tachycardia) (HCC)     Past Surgical History:  Procedure Laterality Date  . CARDIAC CATHETERIZATION N/A 12/10/2016   Procedure: Left Heart Cath and Coronary Angiography;  Surgeon: Jettie Booze, MD;  Location: [facility];  Service: Cardiovascular;  Laterality: N/A;  . CARDIAC CATHETERIZATION N/A 12/10/2016   Procedure: Coronary Balloon Angioplasty;  Surgeon: Jettie Booze, MD;  Location: [facility];  Service: Cardiovascular;  Laterality: N/A;  . CARDIAC CATHETERIZATION N/A 12/10/2016   Procedure: Intravascular Pressure Wire/FFR Study;  Surgeon: Jettie Booze, MD;  Location: [facility];  Service: Cardiovascular;  Laterality: N/A;  . CORONARY STENT PLACEMENT  10/2008   x 2  . ELECTROPHYSIOLOGIC STUDY N/A 03/20/2015   no inducible SVT - Dr Lovena Le  . INGUINAL HERNIA REPAIR Right    RIH with ultrapro patch    Social History   Social History  . Marital status: Married    Spouse name: N/A  . Number of children: 3  . Years of education: N/A   Occupational History  . 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 Smoking status: Former Smoker    Types: Cigarettes    Quit date: 03/05/1956  . Smokeless tobacco: Never Used     Comment: smoked in teens  . Alcohol use No  . Drug use: No  . Sexual activity: Not Asked   Other Topics Concern  . None   Social History Narrative   Lives with wife in a 3 story home.  Has 2 living children.  1 passed away in a car accident.     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      Family History  Problem Relation Age of Onset  . Bladder Cancer Mother 27  . Heart attack Father 6       Deceased  . Pancreatic cancer Sister 58  . Healthy Son   . Healthy Daughter     Review of Systems  Constitutional: Negative for chills and fever.  HENT: Negative for sore throat and trouble swallowing.   Respiratory:  Negative for cough, shortness of breath and wheezing.        Objective:   Vitals:   07/15/17 1502  BP: (!) 144/72  Pulse: 65  Resp: 16  Temp: 98.1 F (36.7 C)  SpO2: 97%   Filed Weights   07/15/17 1502  Weight: 160 lb (72.6 kg)   Body mass index is 22.32 kg/m.  Wt Readings from Last 3 Encounters:  07/15/17 160 lb (72.6 kg)  06/11/17 152 lb (  68.9 kg)  05/14/17 164 lb (74.4 kg)     Physical Exam  Constitutional: He appears well-developed and well-nourished. No distress.  HENT:  Head: Normocephalic and atraumatic.  Mouth/Throat: Oropharynx is clear and moist.  Neck: Neck supple. No tracheal deviation present. No thyromegaly present.  Cardiovascular: Normal rate and regular rhythm.   Pulmonary/Chest: No respiratory distress. He has no wheezes. He has no rales.  Lymphadenopathy:    He has no cervical adenopathy.  Skin: He is not diaphoretic.  Hives on arms and legs with scratch marks, none visible on face          Assessment & Plan:   See Problem List for Assessment and Plan of chronic medical problems.

## 2017-07-15 NOTE — Assessment & Plan Note (Signed)
On arms and legs No evidence of swelling or sob Depo-medrol 80 mg IM x 1 Prednisone taper starting tomorrow Zyrtec nightly - continue long term Discussed referral to allergist - he deferred today Call if no improvement

## 2017-07-17 DIAGNOSIS — I2510 Atherosclerotic heart disease of native coronary artery without angina pectoris: Secondary | ICD-10-CM | POA: Diagnosis not present

## 2017-07-17 DIAGNOSIS — I10 Essential (primary) hypertension: Secondary | ICD-10-CM | POA: Diagnosis not present

## 2017-07-17 DIAGNOSIS — K219 Gastro-esophageal reflux disease without esophagitis: Secondary | ICD-10-CM | POA: Diagnosis not present

## 2017-07-17 DIAGNOSIS — Z87891 Personal history of nicotine dependence: Secondary | ICD-10-CM | POA: Diagnosis not present

## 2017-07-17 DIAGNOSIS — Z7984 Long term (current) use of oral hypoglycemic drugs: Secondary | ICD-10-CM | POA: Diagnosis not present

## 2017-07-17 DIAGNOSIS — Z7982 Long term (current) use of aspirin: Secondary | ICD-10-CM | POA: Diagnosis not present

## 2017-07-17 DIAGNOSIS — Z951 Presence of aortocoronary bypass graft: Secondary | ICD-10-CM | POA: Diagnosis not present

## 2017-07-17 DIAGNOSIS — E119 Type 2 diabetes mellitus without complications: Secondary | ICD-10-CM | POA: Diagnosis not present

## 2017-07-17 DIAGNOSIS — H2512 Age-related nuclear cataract, left eye: Secondary | ICD-10-CM | POA: Diagnosis not present

## 2017-08-15 ENCOUNTER — Other Ambulatory Visit: Payer: Self-pay | Admitting: Internal Medicine

## 2017-08-19 ENCOUNTER — Ambulatory Visit (INDEPENDENT_AMBULATORY_CARE_PROVIDER_SITE_OTHER): Payer: Medicare HMO | Admitting: Internal Medicine

## 2017-08-19 ENCOUNTER — Other Ambulatory Visit (INDEPENDENT_AMBULATORY_CARE_PROVIDER_SITE_OTHER): Payer: Medicare HMO

## 2017-08-19 ENCOUNTER — Encounter: Payer: Self-pay | Admitting: Internal Medicine

## 2017-08-19 VITALS — BP 114/68 | HR 75 | Temp 97.8°F | Ht 71.0 in | Wt 154.0 lb

## 2017-08-19 DIAGNOSIS — Z0000 Encounter for general adult medical examination without abnormal findings: Secondary | ICD-10-CM | POA: Diagnosis not present

## 2017-08-19 DIAGNOSIS — G8929 Other chronic pain: Secondary | ICD-10-CM

## 2017-08-19 DIAGNOSIS — R350 Frequency of micturition: Secondary | ICD-10-CM

## 2017-08-19 DIAGNOSIS — R197 Diarrhea, unspecified: Secondary | ICD-10-CM

## 2017-08-19 DIAGNOSIS — B370 Candidal stomatitis: Secondary | ICD-10-CM | POA: Insufficient documentation

## 2017-08-19 DIAGNOSIS — Z23 Encounter for immunization: Secondary | ICD-10-CM

## 2017-08-19 DIAGNOSIS — E1159 Type 2 diabetes mellitus with other circulatory complications: Secondary | ICD-10-CM | POA: Diagnosis not present

## 2017-08-19 DIAGNOSIS — M545 Low back pain, unspecified: Secondary | ICD-10-CM

## 2017-08-19 DIAGNOSIS — R634 Abnormal weight loss: Secondary | ICD-10-CM | POA: Diagnosis not present

## 2017-08-19 LAB — HEPATIC FUNCTION PANEL
ALBUMIN: 3.8 g/dL (ref 3.5–5.2)
ALK PHOS: 39 U/L (ref 39–117)
ALT: 20 U/L (ref 0–53)
AST: 18 U/L (ref 0–37)
BILIRUBIN TOTAL: 0.7 mg/dL (ref 0.2–1.2)
Bilirubin, Direct: 0.2 mg/dL (ref 0.0–0.3)
Total Protein: 6.3 g/dL (ref 6.0–8.3)

## 2017-08-19 LAB — BASIC METABOLIC PANEL
BUN: 22 mg/dL (ref 6–23)
CALCIUM: 9.1 mg/dL (ref 8.4–10.5)
CO2: 31 mEq/L (ref 19–32)
Chloride: 103 mEq/L (ref 96–112)
Creatinine, Ser: 1.05 mg/dL (ref 0.40–1.50)
GFR: 71.62 mL/min (ref >60.00)
GLUCOSE: 169 mg/dL — AB (ref 70–99)
POTASSIUM: 3.9 meq/L (ref 3.5–5.1)
SODIUM: 141 meq/L (ref 135–145)

## 2017-08-19 LAB — LIPID PANEL
Cholesterol: 130 mg/dL (ref 0–200)
HDL: 28.4 mg/dL — ABNORMAL LOW (ref >39.00)
NONHDL: 101.63
Total CHOL/HDL Ratio: 5
Triglycerides: 244 mg/dL — ABNORMAL HIGH (ref 0.0–149.0)
VLDL: 48.8 mg/dL — ABNORMAL HIGH (ref 0.0–40.0)

## 2017-08-19 LAB — URINALYSIS, ROUTINE W REFLEX MICROSCOPIC
Hgb urine dipstick: NEGATIVE
LEUKOCYTES UA: NEGATIVE
NITRITE: NEGATIVE
RBC / HPF: NONE SEEN (ref 0–?)
Specific Gravity, Urine: >=1.03 — AB (ref 1.000–1.030)
TOTAL PROTEIN, URINE-UPE24: NEGATIVE
URINE GLUCOSE: 100 — AB
UROBILINOGEN UA: 0.2 (ref 0.0–1.0)
WBC, UA: NONE SEEN (ref 0–?)
pH: 5.5 (ref 5.0–8.0)

## 2017-08-19 LAB — CBC WITH DIFFERENTIAL/PLATELET
BASOS PCT: 0.7 % (ref 0.0–3.0)
Basophils Absolute: 0.1 10*3/uL (ref 0.0–0.1)
EOS PCT: 3.3 % (ref 0.0–5.0)
Eosinophils Absolute: 0.3 10*3/uL (ref 0.0–0.7)
HCT: 42.2 % (ref 39.0–52.0)
HEMOGLOBIN: 14.3 g/dL (ref 13.0–17.0)
Lymphocytes Relative: 20.2 % (ref 12.0–46.0)
Lymphs Abs: 1.6 10*3/uL (ref 0.7–4.0)
MCHC: 33.8 g/dL (ref 30.0–36.0)
MCV: 90.5 fl (ref 78.0–100.0)
MONO ABS: 0.7 10*3/uL (ref 0.1–1.0)
MONOS PCT: 8.7 % (ref 3.0–12.0)
Neutro Abs: 5.1 10*3/uL (ref 1.4–7.7)
Neutrophils Relative %: 67.1 % (ref 43.0–77.0)
Platelets: 215 10*3/uL (ref 150.0–400.0)
RBC: 4.66 Mil/uL (ref 4.22–5.81)
RDW: 13.9 % (ref 11.5–15.5)
WBC: 7.7 10*3/uL (ref 4.0–10.5)

## 2017-08-19 LAB — MICROALBUMIN / CREATININE URINE RATIO
Creatinine,U: 333.5 mg/dL
MICROALB/CREAT RATIO: 1 mg/g (ref 0.0–30.0)
Microalb, Ur: 3.4 mg/dL — ABNORMAL HIGH (ref 0.0–1.9)

## 2017-08-19 LAB — LDL CHOLESTEROL, DIRECT: Direct LDL: 66 mg/dL

## 2017-08-19 LAB — TSH: TSH: 2.03 u[IU]/mL (ref 0.35–4.50)

## 2017-08-19 LAB — HEMOGLOBIN A1C: HEMOGLOBIN A1C: 9.6 % — AB (ref 4.6–6.5)

## 2017-08-19 MED ORDER — GLIPIZIDE ER 2.5 MG PO TB24: 2.5000 mg | ORAL_TABLET | Freq: Every day | ORAL | 3 refills | Status: DC

## 2017-08-19 MED ORDER — NYSTATIN 100000 UNIT/ML MT SUSP: 500000.0000 [IU] | Freq: Four times a day (QID) | OROMUCOSAL | 0 refills | Status: AC

## 2017-08-19 MED ORDER — METFORMIN HCL ER 500 MG PO TB24: 500.0000 mg | ORAL_TABLET | Freq: Two times a day (BID) | ORAL | 3 refills | Status: DC

## 2017-08-19 NOTE — Patient Instructions (Addendum)
You had the flu shot today  Ok to decrease the metformin to 500 mg twice per day  Please take all new medication as prescribed - the glipizide ER 2.5 mg per day, and the Nystatin solution for the thrush  Please continue all other medications as before, including the tradjenta  Please ask your pharmacist about any similar medication like the tradjenta that is better covered by your insurance.  Please have the pharmacy call with any other refills you may need.  Please keep your appointments with your specialists as you may have planned  Please go to the LAB in the Basement (turn left off the elevator) for the tests to be done today  You will be contacted by phone if any changes need to be made immediately.  Otherwise, you will receive a letter about your results with an explanation, but please check with MyChart first.  Please remember to sign up for MyChart if you have not done so, as this will be important to you in the future with finding out test results, communicating by private email, and scheduling acute appointments online when needed.

## 2017-08-19 NOTE — Assessment & Plan Note (Signed)
With diarrhea possibly related to metformin, will decrease the metformin again to 500 bid, cont tradjenta (or similar if too expensive), and add glipizide ER 2.5 qd,  to f/u any worsening symptoms or concerns

## 2017-08-19 NOTE — Assessment & Plan Note (Addendum)
Etiology unclear, including viral vs drug related, some improved today, exam benign, will hold on specific evaluation or tx at this time  Note:  Total time for pt hx, exam, review of record with pt in the room, determination of diagnoses and plan for further eval and tx is > 40 min, with over 50% spent in coordination and counseling of patient, including the differential dx, tx, further evaluation and other management of acute diarrhea, uncontrolled DM, urinary frequency, wt loss, thrush and lower back pain

## 2017-08-19 NOTE — Progress Notes (Signed)
Subjective:    Patient ID: Keith Herrera, male    DOB: 1932/09/29, 83 y.o.   MRN: [id number]  HPI  Here with 1 wk loose stools about 5 times per day with nauea and belching, decreased appetite, but no high fever, chills, vomiting, blood or abd pain. Also has urinary freq new and unsual for him with freq up to every 2 hours, and incidentally says last night and today not so bad, slept for 5 hrs straight last PM.  Has tongue coating better today, and food does not taste right.  No ST, mouth pain, Denies urinary symptoms such as  flank pain, hematuria, or high fever, chills.  BP is lower today as well.  Appears to have lost some wt.  CBG's in higher  100's.  States he thinks metformin increase to 1000 bid may have contributed to onset loose bowels.  Wt Readings from Last 3 Encounters:  08/19/17 154 lb (69.9 kg)  07/15/17 160 lb (72.6 kg)  06/11/17 152 lb (68.9 kg)  Also mentions 2 wks onset lower back pain, better with ice pak, back brace but still has some radiation of the pain to the left buttock and hip area.   Past Medical History:  Diagnosis Date  . Adenomatous polyp of colon 05/2004  . Allergic rhinitis   . Anxiety   . BPH (benign prostatic hyperplasia)   . CAD (coronary artery disease)    s/p Cypher DES to LAD and pD1 2010;  LHC was done 6/12: EF 65%, circumflex patent, mid RCA 80-90% (small and nondominant), LAD and diagonal stents patent, LAD at the origin of the first diagonal 30%, ostial D2 90%, mid 80-90% (small vessel).  Continued medical therapy was recommended.   . Carotid stenosis    dopplers 02/2012: 6-26% RICA; 94-85% LICA  . Diabetes type 2, controlled ([place])   . Diverticulosis    colon  . ED (erectile dysfunction)   . Esophageal stricture   . GERD (gastroesophageal reflux disease)   . Hemorrhoids   . Hiatal hernia   . Hyperlipidemia   . Hypertension   . IBS (irritable bowel syndrome)   . Right inguinal hernia    s/p repair in 8/12  . SVT (supraventricular  tachycardia) (HCC)    Past Surgical History:  Procedure Laterality Date  . CARDIAC CATHETERIZATION N/A 12/10/2016   Procedure: Left Heart Cath and Coronary Angiography;  Surgeon: Jettie Booze, MD;  Location: [facility];  Service: Cardiovascular;  Laterality: N/A;  . CARDIAC CATHETERIZATION N/A 12/10/2016   Procedure: Coronary Balloon Angioplasty;  Surgeon: Jettie Booze, MD;  Location: [facility];  Service: Cardiovascular;  Laterality: N/A;  . CARDIAC CATHETERIZATION N/A 12/10/2016   Procedure: Intravascular Pressure Wire/FFR Study;  Surgeon: Jettie Booze, MD;  Location: [facility];  Service: Cardiovascular;  Laterality: N/A;  . CORONARY STENT PLACEMENT  10/2008   x 2  . ELECTROPHYSIOLOGIC STUDY N/A 03/20/2015   no inducible SVT - Dr Lovena Le  . INGUINAL HERNIA REPAIR Right    RIH with ultrapro patch    reports that he quit smoking about 61 years ago. His smoking use included Cigarettes. He has never used smokeless tobacco. He reports that he does not drink alcohol or use drugs. family history includes Bladder Cancer (age of onset: 54) in his mother; Healthy in his daughter and son; Heart attack (age of onset: 11) in his father; Pancreatic cancer (age of onset: 12) in his sister. Allergies  Allergen Reactions  . Crestor [Rosuvastatin Calcium] Rash    All over body  . Lovastatin Itching and Rash   Current Outpatient Prescriptions on File Prior to Visit  Medication Sig Dispense Refill  . amLODipine (NORVASC) 5 MG tablet Take 1 tablet (5 mg total) by mouth daily. 30 tablet 11  . aspirin EC 81 MG tablet Take 81 mg by mouth daily after supper.    Marland Kitchen atorvastatin (LIPITOR) 10 MG tablet Take 1 tablet (10 mg total) by mouth daily at 6 PM. 30 tablet 5  . carvedilol (COREG) 6.25 MG tablet TAKE 1 TABLET (6.25 MG TOTAL) BY MOUTH 2 (TWO) TIMES DAILY WITH A MEAL. 180 tablet 3  . lansoprazole (PREVACID) 30 MG capsule TAKE 1 CAPSULE (30 MG TOTAL) BY MOUTH DAILY AT  12 NOON. 90 capsule 3  . latanoprost (XALATAN) 0.005 % ophthalmic solution Place 1 drop into both eyes at bedtime.  12  . linagliptin (TRADJENTA) 5 MG TABS tablet Take 1 tablet (5 mg total) by mouth daily. 90 tablet 3  . losartan (COZAAR) 100 MG tablet Take 100 mg by mouth daily.    . nitroGLYCERIN (NITROSTAT) 0.4 MG SL tablet Place 1 tablet (0.4 mg total) under the tongue every 5 (five) minutes as needed for chest pain. 20 tablet 0   No current facility-administered medications on file prior to visit.    Review of Systems  Constitutional: Negative for other unusual diaphoresis or sweats HENT: Negative for ear discharge or swelling Eyes: Negative for other worsening visual disturbances Respiratory: Negative for stridor or other swelling  Gastrointestinal: Negative for worsening distension or other blood Genitourinary: Negative for retention or other urinary change Musculoskeletal: Negative for other MSK pain or swelling Skin: Negative for color change or other new lesions Neurological: Negative for worsening tremors and other numbness  Psychiatric/Behavioral: Negative for worsening agitation or other fatigue All other system neg per pt    Objective:   Physical Exam BP 114/68   Pulse 75   Temp 97.8 F (36.6 C) (Oral)   Ht 5\' 11"  (1.803 m)   Wt 154 lb (69.9 kg)   SpO2 99%   BMI 21.48 kg/m  VS noted, mild ill, appears to have lost wt Constitutional: Pt appears in NAD HENT: Head: NCAT.  Right Ear: External ear normal.  Left Ear: External ear normal.  Eyes: . Pupils are equal, round, and reactive to light. Conjunctivae and EOM are normal Nose: without d/c or deformity Tongue with white coating Neck: Neck supple. Gross normal ROM Cardiovascular: Normal rate and regular rhythm.   Pulmonary/Chest: Effort normal and breath sounds without rales or wheezing.  Abd:  Soft, NT, ND, + BS, no organomegaly Spine: nontender midline Neurological: Pt is alert. At baseline orientation, motor  grossly intact Skin: Skin is warm. No rashes, other new lesions, no LE edema Psychiatric: Pt behavior is normal without agitation  No other exam findings Lab Results  Component Value Date   WBC 7.7 08/19/2017   HGB 14.3 08/19/2017   HCT 42.2 08/19/2017   PLT 215.0 08/19/2017   GLUCOSE 169 (H) 08/19/2017   CHOL 130 08/19/2017   TRIG (H) 08/19/2017    244.0 Triglyceride is over 400; calculations on Lipids are invalid.   HDL 28.40 (L) 08/19/2017   LDLDIRECT 66.0 08/19/2017   LDLCALC 63 12/21/2015   ALT 20 08/19/2017   AST 18 08/19/2017   NA 141 08/19/2017   K 3.9 08/19/2017   CL 103 08/19/2017   CREATININE 1.05 08/19/2017  BUN 22 08/19/2017   CO2 31 08/19/2017   TSH 2.03 08/19/2017   PSA 2.49 12/21/2012   INR 1.15 12/10/2016   HGBA1C 9.6 (H) 08/19/2017   MICROALBUR 3.4 (H) 08/19/2017      Assessment & Plan:

## 2017-08-19 NOTE — Assessment & Plan Note (Signed)
Mild to mod, for antibx course,  to f/u any worsening symptoms or concerns 

## 2017-08-19 NOTE — Assessment & Plan Note (Signed)
Exam benign, no neuro change, c/w msk etiology, mild, declines further eval or tx for now

## 2017-08-19 NOTE — Assessment & Plan Note (Signed)
Etiology unclear, ? Geriatric decline,  to f/u any worsening symptoms or concerns

## 2017-08-19 NOTE — Assessment & Plan Note (Signed)
For urine studies, consider urology but declines for now

## 2017-08-20 LAB — URINE CULTURE
MICRO NUMBER:: 81092266
Result:: NO GROWTH
SPECIMEN QUALITY:: ADEQUATE

## 2017-09-16 ENCOUNTER — Ambulatory Visit (INDEPENDENT_AMBULATORY_CARE_PROVIDER_SITE_OTHER): Payer: Medicare HMO | Admitting: Internal Medicine

## 2017-09-16 ENCOUNTER — Encounter: Payer: Self-pay | Admitting: Internal Medicine

## 2017-09-16 VITALS — BP 138/64 | HR 62 | Temp 97.8°F | Resp 16 | Wt 161.2 lb

## 2017-09-16 DIAGNOSIS — E1159 Type 2 diabetes mellitus with other circulatory complications: Secondary | ICD-10-CM

## 2017-09-16 DIAGNOSIS — Z0001 Encounter for general adult medical examination with abnormal findings: Secondary | ICD-10-CM

## 2017-09-16 DIAGNOSIS — L299 Pruritus, unspecified: Secondary | ICD-10-CM

## 2017-09-16 DIAGNOSIS — Z0000 Encounter for general adult medical examination without abnormal findings: Secondary | ICD-10-CM

## 2017-09-16 DIAGNOSIS — J309 Allergic rhinitis, unspecified: Secondary | ICD-10-CM

## 2017-09-16 MED ORDER — GLIPIZIDE ER 10 MG PO TB24: 10.0000 mg | ORAL_TABLET | Freq: Every day | ORAL | 3 refills | Status: DC

## 2017-09-16 MED ORDER — LANCETS MISC: 3 refills | Status: AC

## 2017-09-16 MED ORDER — GLUCOSE BLOOD VI STRP: ORAL_STRIP | 3 refills | Status: DC

## 2017-09-16 MED ORDER — ONETOUCH ULTRA 2 W/DEVICE KIT
PACK | 0 refills | Status: AC
Start: 2017-09-16 — End: ?

## 2017-09-16 MED ORDER — ZOSTER VAC RECOMB ADJUVANTED 50 MCG/0.5ML IM SUSR: 0.5000 mL | Freq: Once | INTRAMUSCULAR | 1 refills | Status: AC

## 2017-09-16 MED ORDER — METHYLPREDNISOLONE ACETATE 80 MG/ML IJ SUSP
80.0000 mg | Freq: Once | INTRAMUSCULAR | Status: AC
  Administered 2017-09-16: 80 mg via INTRAMUSCULAR

## 2017-09-16 MED ORDER — PREDNISONE 10 MG PO TABS: ORAL_TABLET | ORAL | 0 refills | Status: DC

## 2017-09-16 NOTE — Progress Notes (Signed)
Subjective:    Patient ID: Keith Herrera, male    DOB: 09-18-1932, 83 y.o.   MRN: [id number]  HPI  Here for wellness and f/u;  Overall doing ok;  Pt denies Chest pain, worsening SOB, DOE, wheezing, orthopnea, PND, worsening LE edema, palpitations, dizziness or syncope.  Pt denies neurological change such as new headache, facial or extremity weakness.  Pt denies polydipsia, polyuria, or low sugar symptoms. Pt states overall good compliance with treatment and medications, good tolerability, and has been trying to follow appropriate diet.  Pt denies worsening depressive symptoms, suicidal ideation or panic. No fever, night sweats, wt loss, loss of appetite, or other constitutional symptoms.  Pt states good ability with ADL's, has low fall risk, home safety reviewed and adequate, no other significant changes in hearing or vision, and not active with regular exercise. CBG's in the AM have been 140's though he does not check often.  Also has been seeing Dr [facility] initially for glaucoma, then s/p cataract removed. Has been slowed up by this bu plans to be more active now. Wt has been up several lbs as well.,  C/o cant sleep at night due to itching " all over" that hjust wont stop.  Variable locations and times duering the days, no rash this time, but similar to last visit improved with depomedrol and prednisone, Asks also for allergy referrral.   Past Medical History:  Diagnosis Date  . Adenomatous polyp of colon 05/2004  . Allergic rhinitis   . Anxiety   . BPH (benign prostatic hyperplasia)   . CAD (coronary artery disease)    s/p Cypher DES to LAD and pD1 2010;  LHC was done 6/12: EF 65%, circumflex patent, mid RCA 80-90% (small and nondominant), LAD and diagonal stents patent, LAD at the origin of the first diagonal 30%, ostial D2 90%, mid 80-90% (small vessel).  Continued medical therapy was recommended.   . Carotid stenosis    dopplers 02/2012: 2-50% RICA; 53-97% LICA  . Diabetes type 2,  controlled ([place])   . Diverticulosis    colon  . ED (erectile dysfunction)   . Esophageal stricture   . GERD (gastroesophageal reflux disease)   . Hemorrhoids   . Hiatal hernia   . Hyperlipidemia   . Hypertension   . IBS (irritable bowel syndrome)   . Right inguinal hernia    s/p repair in 8/12  . SVT (supraventricular tachycardia) (HCC)    Past Surgical History:  Procedure Laterality Date  . CARDIAC CATHETERIZATION N/A 12/10/2016   Procedure: Left Heart Cath and Coronary Angiography;  Surgeon: Jettie Booze, MD;  Location: [place] [facility];  Service: Cardiovascular;  Laterality: N/A;  . CARDIAC CATHETERIZATION N/A 12/10/2016   Procedure: Coronary Balloon Angioplasty;  Surgeon: Jettie Booze, MD;  Location: [place] [facility];  Service: Cardiovascular;  Laterality: N/A;  . CARDIAC CATHETERIZATION N/A 12/10/2016   Procedure: Intravascular Pressure Wire/FFR Study;  Surgeon: Jettie Booze, MD;  Location: [place] [facility];  Service: Cardiovascular;  Laterality: N/A;  . CORONARY STENT PLACEMENT  10/2008   x 2  . ELECTROPHYSIOLOGIC STUDY N/A 03/20/2015   no inducible SVT - Dr Lovena Le  . INGUINAL HERNIA REPAIR Right    RIH with ultrapro patch    reports that he quit smoking about 61 years ago. His smoking use included Cigarettes. He has never used smokeless tobacco. He reports that he does not drink alcohol or use drugs. family history includes Bladder Cancer (age of  onset: 27) in his mother; Healthy in his daughter and son; Heart attack (age of onset: 65) in his father; Pancreatic cancer (age of onset: 92) in his sister. Allergies  Allergen Reactions  . Crestor [Rosuvastatin Calcium] Rash    All over body  . Lovastatin Itching and Rash   Current Outpatient Prescriptions on File Prior to Visit  Medication Sig Dispense Refill  . amLODipine (NORVASC) 5 MG tablet Take 1 tablet (5 mg total) by mouth daily. 30 tablet 11  . aspirin EC 81 MG tablet Take 81 mg by mouth daily  after supper.    Marland Kitchen atorvastatin (LIPITOR) 10 MG tablet Take 1 tablet (10 mg total) by mouth daily at 6 PM. 30 tablet 5  . carvedilol (COREG) 6.25 MG tablet TAKE 1 TABLET (6.25 MG TOTAL) BY MOUTH 2 (TWO) TIMES DAILY WITH A MEAL. 180 tablet 3  . lansoprazole (PREVACID) 30 MG capsule TAKE 1 CAPSULE (30 MG TOTAL) BY MOUTH DAILY AT 12 NOON. 90 capsule 3  . latanoprost (XALATAN) 0.005 % ophthalmic solution Place 1 drop into both eyes at bedtime.  12  . linagliptin (TRADJENTA) 5 MG TABS tablet Take 1 tablet (5 mg total) by mouth daily. 90 tablet 3  . losartan (COZAAR) 100 MG tablet Take 100 mg by mouth daily.    . metFORMIN (GLUCOPHAGE-XR) 500 MG 24 hr tablet Take 1 tablet (500 mg total) by mouth 2 (two) times daily. 180 tablet 3  . nitroGLYCERIN (NITROSTAT) 0.4 MG SL tablet Place 1 tablet (0.4 mg total) under the tongue every 5 (five) minutes as needed for chest pain. 20 tablet 0   No current facility-administered medications on file prior to visit.    Review of Systems Constitutional: Negative for other unusual diaphoresis, sweats, appetite or weight changes HENT: Negative for other worsening hearing loss, ear pain, facial swelling, mouth sores or neck stiffness.   Eyes: Negative for other worsening pain, redness or other visual disturbance.  Respiratory: Negative for other stridor or swelling Cardiovascular: Negative for other palpitations or other chest pain  Gastrointestinal: Negative for worsening diarrhea or loose stools, blood in stool, distention or other pain Genitourinary: Negative for hematuria, flank pain or other change in urine volume.  Musculoskeletal: Negative for myalgias or other joint swelling.  Skin: Negative for other color change, or other wound or worsening drainage.  Neurological: Negative for other syncope or numbness. Hematological: Negative for other adenopathy or swelling Psychiatric/Behavioral: Negative for hallucinations, other worsening agitation, SI, self-injury, or  new decreased concentration All other system neg per pt    Objective:   Physical Exam BP 138/64 (BP Location: Left Arm, Patient Position: Sitting, Cuff Size: Normal)   Pulse 62   Temp 97.8 F (36.6 C) (Oral)   Resp 16   Wt 161 lb 4 oz (73.1 kg)   SpO2 98%   BMI 22.49 kg/m  VS noted,  Constitutional: Pt is oriented to person, place, and time. Appears well-developed and well-nourished, in no significant distress and comfortable Head: Normocephalic and atraumatic  Eyes: Conjunctivae and EOM are normal. Pupils are equal, round, and reactive to light Right Ear: External ear normal without discharge Left Ear: External ear normal without discharge Nose: Nose without discharge or deformity Mouth/Throat: Oropharynx is without other ulcerations and moist  Neck: Normal range of motion. Neck supple. No JVD present. No tracheal deviation present or significant neck LA or mass Cardiovascular: Normal rate, regular rhythm, normal heart sounds and intact distal pulses.   Pulmonary/Chest: WOB normal and  breath sounds without rales or wheezing  Abdominal: Soft. Bowel sounds are normal. NT. No HSM  Musculoskeletal: Normal range of motion. Exhibits no edema Lymphadenopathy: Has no other cervical adenopathy.  Neurological: Pt is alert and oriented to person, place, and time. Pt has normal reflexes. No cranial nerve deficit. Motor grossly intact, Gait intact Skin: Skin is warm and dry. No rash noted or new ulcerations Psychiatric:  Has normal mood and affect. Behavior is normal without agitation No other exam findings    Assessment & Plan:

## 2017-09-16 NOTE — Patient Instructions (Addendum)
You had the steroid shot today  Please take all new medication as prescribed - the short course of prednisone  OK to increase the Glipizide ER to 10 mg per day  Your Shingrix shot was sent to the pharmacy, as well as the precriptoin for new glucometer, strips and lancets  Please continue all other medications as before, and refills have been done if requested.  You will be contacted regarding the referral for: Allergy  Please have the pharmacy call with any other refills you may need.  Please continue your efforts at being more active, low cholesterol diet, and weight control.  You are otherwise up to date with prevention measures today.  Please keep your appointments with your specialists as you may have planned  Please return in 3 months, or sooner if needed, with Lab testing done 3-5 days before

## 2017-09-19 NOTE — Assessment & Plan Note (Signed)
Uncontrolled, to increase the glipizide ER to 10 qed, check cbg with supplies

## 2017-09-19 NOTE — Assessment & Plan Note (Addendum)
Probable allergic, for depomedrol IM 80, for prednisone 20 qd x 5 days, referral to allergy

## 2017-09-19 NOTE — Assessment & Plan Note (Signed)

## 2017-09-19 NOTE — Assessment & Plan Note (Signed)
For allergy referrral

## 2017-09-20 IMAGING — NM NM MYOCAR MULTI W/SPECT W/WALL MOTION & EF
1 series · 6 of 6 positions shown · non-contrast
Comparison: None.

CLINICAL DATA: Chest pain, coronary artery disease

EXAM:
MYOCARDIAL IMAGING WITH SPECT (REST AND PHARMACOLOGIC-STRESS)
GATED LEFT VENTRICULAR WALL MOTION STUDY
LEFT VENTRICULAR EJECTION FRACTION
TECHNIQUE: Standard myocardial SPECT imaging was performed after resting
intravenous injection of 10 mCi 9c-TTm tetrofosmin. Subsequently,
intravenous infusion of Lexiscan was performed under the supervision
of the Cardiology staff. At peak effect of the drug, 30 mCi 9c-TTm
tetrofosmin was injected intravenously and standard myocardial SPECT
imaging was performed. Quantitative gated imaging was also performed
to evaluate left ventricular wall motion, and estimate left
ventricular ejection fraction.

[Series 1: rest · 8.28mm/px · 6 of 64 frames shown]
[frame 6/64]
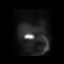
[frame 16/64]
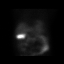
[frame 27/64]
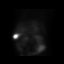
[frame 38/64]
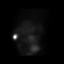
[frame 48/64]
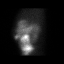
[frame 59/64]
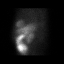

[6 of 6 positions shown; findings below may reference images not displayed]

FINDINGS: Perfusion: There is decreased activity in the inferior wall on both
rest and stress images. This area moves and thickens normally
suggesting soft tissue attenuation. However, more pronounced
decreased activity is noted in the inferior wall and adjacent
lateral wall on stress imaging concerning for inducible ischemia.

Wall Motion: Normal left ventricular wall motion. No left
ventricular dilation.

Left Ventricular Ejection Fraction: 66 %

End diastolic volume 56 ml

End systolic volume 19 ml
IMPRESSION: 1. Findings concerning for inducible ischemia in the inferolateral
wall.

2. Normal left ventricular wall motion.

3. Left ventricular ejection fraction 66%

4. Non invasive risk stratification*: Intermediate

*4514 Appropriate Use Criteria for Coronary Revascularization
Focused Update: J Am Coll Cardiol. 4514;59(9):857-881.
[URL]

## 2017-10-16 NOTE — Progress Notes (Signed)
Cardiology Office Note    Date:  10/20/2017   ID:  Keith Herrera, DOB 12-26-1931, MRN [id number]  PCP:  [name], MD  Cardiologist: Dr. Burt Knack  Chief Complaint: 6 Months follow up  History of Present Illness:   Keith Herrera is a 83 y.o. male coronary artery disease s/p stenting of the LAD and diagonal back in 2010 and angioplasty to 1st diagonal 11/2016, SVT, hypertension, and type 2 diabetes, HLD and GERD presented for follow up.   The patient presented in January 2018 with chest pain and palpitations. A stress test showed inferior ischemia. He underwent cardiac catheterization demonstrating patency of his previous stent sites, but severe stenosis in the first diagonal treated with balloon angioplasty alone. There was moderate stenosis in the LAD which was interrogated with pressure wire analysis and this was negative for ischemia. His LV function has been normal. He was treated initially with aspirin and clopidogrel, but the patient discontinued clopidogrel because he needed eye surgery for treatment of glaucoma. He did well with surgery and had no complications.  Last seen by Dr. Burt Knack 04/07/17. He was going well. Continue ASA only therapy.   Here today for follow up.  He runs on the treadmill every day for about 30 minutes without chest pain or shortness of breath.  He denies orthopnea, PND, lower extremity edema, palpitations, chest pain, shortness of breath or melena.  He has intermittent dizziness while laying down and rapid head movement.  He feels full ears.  He denies fever, chills, cough or congestion.  He also having some blurry eye on the right side.   Past Medical History:  Diagnosis Date  . Adenomatous polyp of colon 05/2004  . Allergic rhinitis   . Anxiety   . BPH (benign prostatic hyperplasia)   . CAD (coronary artery disease)    s/p Cypher DES to LAD and pD1 2010;  LHC was done 6/12: EF 65%, circumflex patent, mid RCA 80-90% (small and nondominant), LAD and  diagonal stents patent, LAD at the origin of the first diagonal 30%, ostial D2 90%, mid 80-90% (small vessel).  Continued medical therapy was recommended.   . Carotid stenosis    dopplers 02/2012: 0-09% RICA; 23-30% LICA  . Diabetes type 2, controlled ([place])   . Diverticulosis    colon  . ED (erectile dysfunction)   . Esophageal stricture   . GERD (gastroesophageal reflux disease)   . Hemorrhoids   . Hiatal hernia   . Hyperlipidemia   . Hypertension   . IBS (irritable bowel syndrome)   . Right inguinal hernia    s/p repair in 8/12  . SVT (supraventricular tachycardia) (HCC)     Past Surgical History:  Procedure Laterality Date  . CARDIAC CATHETERIZATION N/A 12/10/2016   Procedure: Left Heart Cath and Coronary Angiography;  Surgeon: Jettie Booze, MD;  Location: [place] [facility];  Service: Cardiovascular;  Laterality: N/A;  . CARDIAC CATHETERIZATION N/A 12/10/2016   Procedure: Coronary Balloon Angioplasty;  Surgeon: Jettie Booze, MD;  Location: [place] [facility];  Service: Cardiovascular;  Laterality: N/A;  . CARDIAC CATHETERIZATION N/A 12/10/2016   Procedure: Intravascular Pressure Wire/FFR Study;  Surgeon: Jettie Booze, MD;  Location: [place] [facility];  Service: Cardiovascular;  Laterality: N/A;  . CORONARY STENT PLACEMENT  10/2008   x 2  . ELECTROPHYSIOLOGIC STUDY N/A 03/20/2015   no inducible SVT - Dr Lovena Le  . INGUINAL HERNIA REPAIR Right    RIH with ultrapro  patch    Current Medications: Prior to Admission medications   Medication Sig Start Date End Date Taking? Authorizing Provider  amLODipine (NORVASC) 5 MG tablet Take 1 tablet (5 mg total) by mouth daily. 04/07/17   Sherren Mocha, MD  aspirin EC 81 MG tablet Take 81 mg by mouth daily after supper.    [provider]  atorvastatin (LIPITOR) 10 MG tablet Take 1 tablet (10 mg total) by mouth daily at 6 PM. 06/30/17   [name], MD  Blood Glucose Monitoring Suppl (ONE TOUCH ULTRA 2) w/Device  KIT Use as directed E 11.9 09/16/17   [name], MD  carvedilol (COREG) 6.25 MG tablet TAKE 1 TABLET (6.25 MG TOTAL) BY MOUTH 2 (TWO) TIMES DAILY WITH A MEAL. 08/15/17   [name], MD  glipiZIDE (GLUCOTROL XL) 10 MG 24 hr tablet Take 1 tablet (10 mg total) by mouth daily with breakfast. 09/16/17   [name], MD  glucose blood (ONE TOUCH ULTRA TEST) test strip Use as instructed TWICE daily E11.9 09/16/17   [name], MD  Lancets MISC Use as directed twice daily E11.9 09/16/17   [name], MD  lansoprazole (PREVACID) 30 MG capsule TAKE 1 CAPSULE (30 MG TOTAL) BY MOUTH DAILY AT 12 NOON. 07/01/17   [name], MD  latanoprost (XALATAN) 0.005 % ophthalmic solution Place 1 drop into both eyes at bedtime. 11/20/16   [provider]  linagliptin (TRADJENTA) 5 MG TABS tablet Take 1 tablet (5 mg total) by mouth daily. 06/11/17   [name], MD  losartan (COZAAR) 100 MG tablet Take 100 mg by mouth daily.    [provider]  metFORMIN (GLUCOPHAGE-XR) 500 MG 24 hr tablet Take 1 tablet (500 mg total) by mouth 2 (two) times daily. 08/19/17   [name], MD  nitroGLYCERIN (NITROSTAT) 0.4 MG SL tablet Place 1 tablet (0.4 mg total) under the tongue every 5 (five) minutes as needed for chest pain. 12/11/16   Geradine Girt, DO  predniSONE (DELTASONE) 10 MG tablet 2 tabs by mouth per day for 5 days 09/16/17   [name], MD    Allergies:   Crestor [rosuvastatin calcium] and Lovastatin   Social History   Socioeconomic History  . Marital status: Married    Spouse name: None  . Number of children: 3  . Years of education: None  . Highest education level: None  Social Needs  . Financial resource strain: None  . Food insecurity - worry: None  . Food insecurity - inability: None  . Transportation needs - medical: None  . Transportation needs - non-medical: None  Occupational History  . 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  Smoking status: Former Smoker    Types: Cigarettes    Last attempt to quit: 03/05/1956    Years since quitting: 61.6  . Smokeless tobacco: Never Used  . Tobacco comment: smoked in teens  Substance and Sexual Activity  . Alcohol use: No    Alcohol/week: 0.0 oz  . Drug use: No  . Sexual activity: None  Other Topics Concern  . None  Social History Narrative   Lives with wife in a 3 story home.  Has 2 living children.  1 passed away in a car accident.     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  Family History:  The patient's family history includes Bladder Cancer (age of onset: 11) in his mother; Healthy in his daughter and son; Heart attack (age of onset: 6) in his father; Pancreatic cancer (age of onset: 1) in his sister.   ROS:   Please see the history of present illness.    ROS All other systems reviewed and are negative.   PHYSICAL EXAM:   VS:  BP 130/70   Pulse (!) 56   Ht '5\' 11"'$  (1.803 m)   Wt 157 lb 12.8 oz (71.6 kg)   SpO2 97%   BMI 22.01 kg/m    GEN: Well nourished, well developed, in no acute distress  HEENT: normal  Neck: no JVD, carotid bruits, or masses Cardiac: RRR; no murmurs, rubs, or gallops,no edema  Respiratory:  clear to auscultation bilaterally, normal work of breathing GI: soft, nontender, nondistended, + BS MS: no deformity or atrophy  Skin: warm and dry, no rash Neuro:  Alert and Oriented x 3, Strength and sensation are intact Psych: euthymic mood, full affect  Wt Readings from Last 3 Encounters:  10/20/17 157 lb 12.8 oz (71.6 kg)  09/16/17 161 lb 4 oz (73.1 kg)  08/19/17 154 lb (69.9 kg)      Studies/Labs Reviewed:   EKG:  EKG is ordered today.  The ekg ordered today demonstrates sinus bradycardia at rate of 56 bpm, T wave inversion in inferior lateral leads.  Recent Labs: 08/19/2017: ALT 20; BUN 22; Creatinine, Ser 1.05; Hemoglobin 14.3; Platelets 215.0; Potassium 3.9;  Sodium 141; TSH 2.03   Lipid Panel    Component Value Date/Time   CHOL 130 08/19/2017 1439   TRIG (H) 08/19/2017 1439    244.0 Triglyceride is over 400; calculations on Lipids are invalid.   TRIG 128 11/19/2006 1032   HDL 28.40 (L) 08/19/2017 1439   CHOLHDL 5 08/19/2017 1439   VLDL 48.8 (H) 08/19/2017 1439   LDLCALC 63 12/21/2015 1044   LDLDIRECT 66.0 08/19/2017 1439    Additional studies/ records that were reviewed today include:   Cardiac Cath 12/10/2016: Procedural Details/Technique   Technical Details The risks, benefits, and details of the procedure were explained to the patient. The patient verbalized understanding and wanted to proceed. Informed written consent was obtained.  PROCEDURE TECHNIQUE: After Xylocaine anesthesia a 21F slender sheath was placed in the right radial artery with a single anterior needle wall stick. IV Heparin was given. Left heart cath was done using a JR4 catheter. Right coronary angiography was done using a Judkins R4 guide catheter. Left coronary angiography was done using a Judkins L3.5 guide catheter. A TR band was used for hemostasis.  PCI of the diagonal was performed. Angiomax for anticoagulation.  FFR of the LAD was then performed with IV adenosine for maximal hyperemia. ACIST catheter was used, equalized at the tip of the guide and then advanced across the lesion. Adenosine given. Results as noted in the conclusions.  Contrast: 90 cc    Estimated blood loss <50 mL.  During this procedure the patient was administered the following to achieve and maintain moderate conscious sedation: Versed 1 mg, Fentanyl 25 mcg, while the patient's heart rate, blood pressure, and oxygen saturation were continuously monitored. The period of conscious sedation was 53 minutes, of which I was present face-to-face 100% of this time.    Complications   Complications documented before study signed (12/10/2016 3:04 PM EST)    No complications were associated  with this study.  Documented by Larae Grooms  S, MD - 12/10/2016 3:00 PM EST    Coronary Findings   Dominance: Left  Left Anterior Descending  Prox LAD to Mid LAD lesion, 0% stenosed. Previously placed Prox LAD to Mid LAD drug eluting stent is widely patent.  First Diagonal Branch  Vessel is small in size.  Ost 1st Diag to 1st Diag lesion, 0% stenosed. Previously placed Ost 1st Diag to 1st Diag drug eluting stent is widely patent.  1st Diag lesion, 80% stenosed.  Angioplasty: Lesion crossed with guidewire using a WIRE ASAHI PROWATER 180CM. Angioplasty alone was performed using a BALLOON MOZEC 2.0X12. Maximum pressure: 10 atm. The pre-interventional distal flow is normal (TIMI 3). The post-interventional distal flow is normal (TIMI 3). The intervention was successful . No complications occurred at this lesion. Unable to deliver 2.25 Promus stent through the prior stented area.  There is a 10% residual stenosis post intervention.  Second Diagonal Branch  Vessel is small in size.  Ost 2nd Diag to 2nd Diag lesion, 75% stenosed.  2nd Diag lesion, 75% stenosed.  Left Circumflex  The vessel exhibits minimal luminal irregularities.  Right Coronary Artery  Vessel is small. Dist RCA filled by collaterals from Acute Mrg.  Mid RCA lesion, 100% stenosed. The lesion is chronically occluded with left-to-right and right-to-right collateral flow.  Right Posterior Descending Artery  RPDA filled by collaterals from Dist LAD.  Left Heart   Left Ventricle LV end diastolic pressure is normal.    Aortic Valve There is no aortic valve stenosis.    Coronary Diagrams   Diagnostic Diagram       Post-Intervention Diagram             ASSESSMENT & PLAN:    1. CAD - Last cath as above.  He denies any angina or dyspnea while running on treadmill.  EKG with new nonspecific changes on inferior lateral leads.  Will hold further he is asymptomatic.  Continue aspirin and statin.  2.  HTN -Stable and well controlled on current regimen.  3. HLD - 08/19/2017: Cholesterol 130; HDL 28.40; Triglycerides 244.0 Triglyceride is over 400; calculations on Lipids are invalid.; VLDL 48.8 -Continue statin.  4.  Dizziness -He is not orthostatic by vital.  Occurs while laying on left side. He will follow-up with PCP for further evaluation.  Previously been on medication that improved his symptoms he cannot recall name.  5.  History of glaucoma -Having blurred vision on the right eye.  Advised to follow-up with eye doctor.   Medication Adjustments/Labs and Tests Ordered: Current medicines are reviewed at length with the patient today.  Concerns regarding medicines are outlined above.  Medication changes, Labs and Tests ordered today are listed in the Patient Instructions below. Patient Instructions  Your physician recommends that you continue on your current medications as directed. Please refer to the Current Medication list given to you today.  Your physician wants you to follow-up in: 6 months with Dr. Burt Knack. You will receive a reminder letter in the mail two months in advance. If you don't receive a letter, please call our office to schedule the follow-up appointment.  Your physician recommends that you schedule a follow-up appointment with your PCP for dizziness and blurred vision.     Jarrett Soho, [place]  10/20/2017 10:03 AM    [facility] [address], [place], [place]  [address] Phone: [phone]; Fax: [phone]

## 2017-10-20 ENCOUNTER — Encounter: Payer: Self-pay | Admitting: Physician Assistant

## 2017-10-20 ENCOUNTER — Ambulatory Visit: Payer: Medicare HMO | Admitting: Physician Assistant

## 2017-10-20 VITALS — BP 130/70 | HR 56 | Ht 71.0 in | Wt 157.8 lb

## 2017-10-20 DIAGNOSIS — E785 Hyperlipidemia, unspecified: Secondary | ICD-10-CM | POA: Diagnosis not present

## 2017-10-20 DIAGNOSIS — I2510 Atherosclerotic heart disease of native coronary artery without angina pectoris: Secondary | ICD-10-CM

## 2017-10-20 DIAGNOSIS — I10 Essential (primary) hypertension: Secondary | ICD-10-CM | POA: Diagnosis not present

## 2017-10-20 DIAGNOSIS — R42 Dizziness and giddiness: Secondary | ICD-10-CM | POA: Diagnosis not present

## 2017-10-20 NOTE — Patient Instructions (Signed)
Your physician recommends that you continue on your current medications as directed. Please refer to the Current Medication list given to you today.  Your physician wants you to follow-up in: 6 months with Dr. Burt Knack. You will receive a reminder letter in the mail two months in advance. If you don't receive a letter, please call our office to schedule the follow-up appointment.  Your physician recommends that you schedule a follow-up appointment with your PCP for dizziness and blurred vision.

## 2017-10-31 ENCOUNTER — Other Ambulatory Visit: Payer: Self-pay | Admitting: Internal Medicine

## 2017-10-31 ENCOUNTER — Encounter: Payer: Self-pay | Admitting: Family

## 2017-10-31 ENCOUNTER — Ambulatory Visit: Payer: Medicare HMO | Admitting: Family

## 2017-10-31 VITALS — BP 132/64 | HR 65 | Temp 97.6°F | Ht 71.0 in | Wt 158.0 lb

## 2017-10-31 DIAGNOSIS — L299 Pruritus, unspecified: Secondary | ICD-10-CM | POA: Diagnosis not present

## 2017-10-31 DIAGNOSIS — K2970 Gastritis, unspecified, without bleeding: Secondary | ICD-10-CM | POA: Diagnosis not present

## 2017-10-31 DIAGNOSIS — E1159 Type 2 diabetes mellitus with other circulatory complications: Secondary | ICD-10-CM

## 2017-10-31 MED ORDER — CETIRIZINE HCL 10 MG PO TABS: 10.0000 mg | ORAL_TABLET | Freq: Every day | ORAL | 0 refills | Status: DC

## 2017-10-31 MED ORDER — GLIPIZIDE ER 5 MG PO TB24: 5.0000 mg | ORAL_TABLET | Freq: Every day | ORAL | 3 refills | Status: DC

## 2017-10-31 MED ORDER — RANITIDINE HCL 150 MG PO CAPS: 150.0000 mg | ORAL_CAPSULE | Freq: Two times a day (BID) | ORAL | 0 refills | Status: DC

## 2017-10-31 NOTE — Patient Instructions (Signed)
Gastritis, Adult  Gastritis is swelling (inflammation) of the stomach. When you have this condition, you can have these problems (symptoms):  ? Pain in your stomach.  ? A burning feeling in your stomach.  ? Feeling sick to your stomach (nauseous).  ? Throwing up (vomiting).  ? Feeling too full after you eat.  It is important to get help for this condition. Without help, your stomach can bleed, and you can get sores (ulcers) in your stomach.  Follow these instructions at home:  ? Take over-the-counter and prescription medicines only as told by your doctor.  ? If you were prescribed an antibiotic medicine, take it as told by your doctor. Do not stop taking it even if you start to feel better.  ? Drink enough fluid to keep your pee (urine) clear or pale yellow.  ? Instead of eating big meals, eat small meals often.  Contact a health care provider if:  ? Your problems get worse.  ? Your problems go away and then come back.  Get help right away if:  ? You throw up blood or something that looks like coffee grounds.  ? You have black or dark red poop (stools).  ? You cannot keep fluids down.  ? Your stomach pain gets worse.  ? You have a fever.  ? You do not feel better after 1 week.  This information is not intended to replace advice given to you by your health care provider. Make sure you discuss any questions you have with your health care provider.  Document Released: 04/22/2008 Document Revised: 07/03/2016 Document Reviewed: 07/29/2015  Elsevier Interactive Patient Education ? 2018 Elsevier Inc.

## 2017-10-31 NOTE — Progress Notes (Signed)
Keith Herrera is a 83 y.o. male with the following history as recorded in Epic[facility]:  Patient Active Problem List   Diagnosis Date Noted  . Thrush 08/19/2017  . Weight loss 08/19/2017  . Diarrhea 08/19/2017  . Urinary frequency 08/19/2017  . Dysphagia 12/18/2016  . Unstable angina pectoris ([place])   . Abnormal nuclear stress test   . Chest pain 12/08/2016  . Cough 02/06/2016  . Wheezing 02/06/2016  . Dizziness 01/22/2016  . Itching 12/21/2015  . Lower back pain 11/24/2015  . SVT (supraventricular tachycardia) ([place]) 01/31/2015  . Type 2 diabetes mellitus with vascular disease ([place]) 01/31/2015  . Hives 02/22/2013  . Carotid bruit 01/15/2012  . Preventative health care 07/01/2011  . BENIGN PROSTATIC HYPERTROPHY 06/26/2010  . CAD, NATIVE VESSEL 11/02/2008  . INGUINAL HERNIA, RIGHT 10/28/2008  . ANXIETY 10/28/2007  . ESOPHAGEAL STRICTURE 10/28/2007  . NEPHROLITHIASIS, HX OF 10/28/2007  . Hyperlipidemia 07/22/2007  . ERECTILE DYSFUNCTION 07/22/2007  . Essential hypertension 07/22/2007  . Allergic rhinitis 07/22/2007  . GERD 07/22/2007  . DIVERTICULOSIS, COLON 07/22/2007  . IBS 07/22/2007  . COLONIC POLYPS, HX OF 07/22/2007  . HEMORRHOIDS, HX OF 07/22/2007    Current Outpatient Medications  Medication Sig Dispense Refill  . amLODipine (NORVASC) 5 MG tablet Take 1 tablet (5 mg total) by mouth daily. 30 tablet 11  . aspirin EC 81 MG tablet Take 81 mg by mouth daily after supper.    Marland Kitchen atorvastatin (LIPITOR) 10 MG tablet Take 1 tablet (10 mg total) by mouth daily at 6 PM. 30 tablet 5  . Blood Glucose Monitoring Suppl (ONE TOUCH ULTRA 2) w/Device KIT Use as directed E 11.9 1 each 0  . carvedilol (COREG) 6.25 MG tablet TAKE 1 TABLET (6.25 MG TOTAL) BY MOUTH 2 (TWO) TIMES DAILY WITH A MEAL. 180 tablet 3  . glipiZIDE (GLUCOTROL XL) 10 MG 24 hr tablet Take 1 tablet (10 mg total) by mouth daily with breakfast. 90 tablet 3  . glucose blood (ONE TOUCH ULTRA TEST) test strip Use as  instructed TWICE daily E11.9 200 each 3  . Lancets MISC Use as directed twice daily E11.9 200 each 3  . lansoprazole (PREVACID) 30 MG capsule TAKE 1 CAPSULE (30 MG TOTAL) BY MOUTH DAILY AT 12 NOON. 90 capsule 3  . latanoprost (XALATAN) 0.005 % ophthalmic solution Place 1 drop into both eyes at bedtime.  12  . linagliptin (TRADJENTA) 5 MG TABS tablet Take 1 tablet (5 mg total) by mouth daily. 90 tablet 3  . losartan (COZAAR) 100 MG tablet Take 100 mg by mouth daily.    . metFORMIN (GLUCOPHAGE-XR) 500 MG 24 hr tablet Take 1 tablet (500 mg total) by mouth 2 (two) times daily. 180 tablet 3  . nitroGLYCERIN (NITROSTAT) 0.4 MG SL tablet Place 1 tablet (0.4 mg total) under the tongue every 5 (five) minutes as needed for chest pain. 20 tablet 0  . cetirizine (ZYRTEC) 10 MG tablet Take 1 tablet (10 mg total) by mouth daily. 30 tablet 0  . ranitidine (ZANTAC) 150 MG capsule Take 1 capsule (150 mg total) by mouth 2 (two) times daily. 60 capsule 0   No current facility-administered medications for this visit.     Allergies: Crestor [rosuvastatin calcium] and Lovastatin  Past Medical History:  Diagnosis Date  . Adenomatous polyp of colon 05/2004  . Allergic rhinitis   . Anxiety   . BPH (benign prostatic hyperplasia)   . CAD (coronary artery disease)    s/p Cypher  DES to LAD and pD1 2010;  LHC was done 6/12: EF 65%, circumflex patent, mid RCA 80-90% (small and nondominant), LAD and diagonal stents patent, LAD at the origin of the first diagonal 30%, ostial D2 90%, mid 80-90% (small vessel).  Continued medical therapy was recommended.   . Carotid stenosis    dopplers 02/2012: 2-77% RICA; 41-28% LICA  . Diabetes type 2, controlled ([place])   . Diverticulosis    colon  . ED (erectile dysfunction)   . Esophageal stricture   . GERD (gastroesophageal reflux disease)   . Hemorrhoids   . Hiatal hernia   . Hyperlipidemia   . Hypertension   . IBS (irritable bowel syndrome)   . Right inguinal hernia    s/p  repair in 8/12  . SVT (supraventricular tachycardia) ([facility])     Past Surgical History:  Procedure Laterality Date  . CARDIAC CATHETERIZATION N/A 12/10/2016   Procedure: Left Heart Cath and Coronary Angiography;  Surgeon: Jettie Booze, MD;  Location: [facility];  Service: Cardiovascular;  Laterality: N/A;  . CARDIAC CATHETERIZATION N/A 12/10/2016   Procedure: Coronary Balloon Angioplasty;  Surgeon: Jettie Booze, MD;  Location: [facility];  Service: Cardiovascular;  Laterality: N/A;  . CARDIAC CATHETERIZATION N/A 12/10/2016   Procedure: Intravascular Pressure Wire/FFR Study;  Surgeon: Jettie Booze, MD;  Location: [facility];  Service: Cardiovascular;  Laterality: N/A;  . CORONARY STENT PLACEMENT  10/2008   x 2  . ELECTROPHYSIOLOGIC STUDY N/A 03/20/2015   no inducible SVT - Dr Lovena Le  . INGUINAL HERNIA REPAIR Right    RIH with ultrapro patch    Family History  Problem Relation Age of Onset  . Bladder Cancer Mother 53  . Heart attack Father 36       Deceased  . Pancreatic cancer Sister 41  . Healthy Son   . Healthy Daughter     Social History   Tobacco Use  . Smoking status: Former Smoker    Types: Cigarettes    Last attempt to quit: 03/05/1956    Years since quitting: 61.6  . Smokeless tobacco: Never Used  . Tobacco comment: smoked in teens  Substance Use Topics  . Alcohol use: No    Alcohol/week: 0.0 oz    Subjective:  Started Sunday with diarrhea; notes that diarrhea lasted for about 2 days; notes that this week has just felt "sick to the stomach" all week; has a "sour taste in the back of the throat; denies any vomiting but increased burping; "A good burp just makes me feel better." Denies any fever but notes in the am feel "feverish." Started Ginger-Ale yesterday and seemed to offer some benefit; has been eating really "bland foods" this week. No blood in the stool, no coffee grounds emesis.  Also notes that he has been having problems  with "hives" and "itching all over" on and off since August; he was supposed to see an allergist this week but the appointment has been re-scheduled. He wants something to help with the itching if possible.  He notes that his blood sugars have been stable this past week during his GI illness. He does mention that he often gets low blood sugars ( around 80) after he works outside for periods of time. Fasting sugars are averaging 120-130. Sugars about an hour after eating are 160.  Objective:  Vitals:   10/31/17 1049  BP: 132/64  Pulse: 65  Temp: 97.6 F (36.4 C)  TempSrc: Oral  SpO2: 98%  Weight: 158 lb (71.7 kg)  Height: _0  (1.803 m)    General: Well developed, well nourished, in no acute distress  Skin : Warm and dry.  Head: Normocephalic and atraumatic  Eyes: Sclera and conjunctiva clear; pupils round and reactive to light; extraocular movements intact  Ears: External normal; canals clear; tympanic membranes normal  Oropharynx: Pink, supple. No suspicious lesions  Neck: Supple without thyromegaly, adenopathy  Lungs: Respirations unlabored; clear to auscultation bilaterally without wheeze, rales, rhonchi  CVS exam: normal rate and regular rhythm.  Abdomen: Soft; nontender; nondistended; normoactive bowel sounds; no masses or hepatosplenomegaly  Musculoskeletal: No deformities; no active joint inflammation  Extremities: No edema, cyanosis, clubbing  Vessels: Symmetric bilaterally  Neurologic: Alert and oriented; speech intact; face symmetrical; moves all extremities well; CNII-XII intact without focal deficit   Assessment:  1. Viral gastritis   2. Itching   3. Type 2 diabetes mellitus with vascular disease ([place])     Plan:  1. Reassurance; continue Prevacid 30 mg daily; trial of Zantac 150 mg bid until acute symptoms resolve; if symptoms still present next week, call back; will need to check labs and imaging. 2. Discussed that Zantac will help with itching; he can also try  Zyrtec which has helped in the past; keep appt with allergist. 3. I will let his PCP know about concerns for episodes of low blood sugar and determine if med changes need to be made. Keep planned follow-up for January 2019.  No Follow-up on file.  No orders of the defined types were placed in this encounter.   Requested Prescriptions   Signed Prescriptions Disp Refills  . ranitidine (ZANTAC) 150 MG capsule 60 capsule 0    Sig: Take 1 capsule (150 mg total) by mouth 2 (two) times daily.  . cetirizine (ZYRTEC) 10 MG tablet 30 tablet 0    Sig: Take 1 tablet (10 mg total) by mouth daily.

## 2017-11-04 ENCOUNTER — Ambulatory Visit: Payer: Self-pay | Admitting: Allergy and Immunology

## 2017-11-28 ENCOUNTER — Other Ambulatory Visit: Payer: Self-pay | Admitting: Family

## 2017-12-01 ENCOUNTER — Ambulatory Visit (INDEPENDENT_AMBULATORY_CARE_PROVIDER_SITE_OTHER): Payer: Medicare HMO | Admitting: Allergy and Immunology

## 2017-12-01 ENCOUNTER — Encounter: Payer: Self-pay | Admitting: Allergy and Immunology

## 2017-12-01 VITALS — BP 130/70 | HR 67 | Ht 71.0 in | Wt 160.0 lb

## 2017-12-01 DIAGNOSIS — K2970 Gastritis, unspecified, without bleeding: Secondary | ICD-10-CM

## 2017-12-01 DIAGNOSIS — L500 Allergic urticaria: Secondary | ICD-10-CM | POA: Diagnosis not present

## 2017-12-01 DIAGNOSIS — J310 Chronic rhinitis: Secondary | ICD-10-CM | POA: Diagnosis not present

## 2017-12-01 DIAGNOSIS — T7840XD Allergy, unspecified, subsequent encounter: Secondary | ICD-10-CM | POA: Diagnosis not present

## 2017-12-01 MED ORDER — RANITIDINE HCL 150 MG PO TABS: 75.0000 mg | ORAL_TABLET | Freq: Two times a day (BID) | ORAL | 3 refills | Status: DC

## 2017-12-01 MED ORDER — FEXOFENADINE HCL 60 MG PO TABS: 60.0000 mg | ORAL_TABLET | Freq: Two times a day (BID) | ORAL | 2 refills | Status: DC | PRN

## 2017-12-01 MED ORDER — FLUTICASONE PROPIONATE 50 MCG/ACT NA SUSP: 2.0000 | Freq: Every day | NASAL | 5 refills | Status: DC

## 2017-12-01 MED ORDER — RANITIDINE HCL 150 MG PO TABS: 150.0000 mg | ORAL_TABLET | Freq: Two times a day (BID) | ORAL | 5 refills | Status: DC

## 2017-12-01 NOTE — Assessment & Plan Note (Signed)
   Fexofenadine (as above).  A prescription has been provided for fluticasone nasal spray, one spray per nostril 1-2 times daily as needed. Proper nasal spray technique has been discussed and demonstrated.  Nasal saline spray (i.e. Simply Saline) is recommended prior to medicated nasal sprays and as needed.

## 2017-12-01 NOTE — Assessment & Plan Note (Signed)
Unclear etiology. Skin tests to select food allergens were negative today. NSAIDs and emotional stress commonly exacerbate urticaria but are not the underlying etiology in this case. Physical urticarias are negative by history (i.e. pressure-induced, temperature, vibration, solar, etc.). History and lesions are not consistent with urticaria pigmentosa so I am not suspicious for mastocytosis. There are no concomitant symptoms concerning for anaphylaxis. We will rule out other potential etiologies with labs. For symptom relief, patient is to take oral antihistamines as directed.  The following labs have been ordered: FCeRI antibody, anti-thyroglobulin antibody, thyroid peroxidase antibody, tryptase, urea breath test, CBC, CMP, ESR, ANA, and galactose-alpha-1,3-galactose IgE level.  The patient will be called with further recommendations after lab results have returned.  A prescription has been provided for fexofenadine, 60 mg twice daily as needed.  A prescription has been provided for ranitidine 75 mg twice daily.  Should there be a significant increase or change in symptoms, a journal is to be kept recording any foods eaten, beverages consumed, medications taken within a 6 hour period prior to the onset of symptoms, as well as record activities being performed, and environmental conditions. For any symptoms concerning for anaphylaxis, 911 is to be called immediately.

## 2017-12-01 NOTE — Progress Notes (Signed)
New Patient Note  RE: Keith Herrera MRN: [id number] DOB: 09-12-1932 Date of Office Visit: 12/01/2017  Referring provider: [name], MD Primary care provider: [name], MD  Chief Complaint: Rash; Pruritus; and Urticaria   History of present illness: Keith Herrera is a 84 y.o. male seen today in consultation requested by Cathlean Cower, MD.  He reports that over the past few years he has experienced generalized pruritus.  The pruritus is most severe on his shoulders, back, and lower extremities.  The itching seems to be worse at nighttime.  In addition, if he scratches a specific area of his skin it will itch more and occasionally develop hives.  He denies vesicles as well as any systemic symptoms.  No specific medication, food, skin care product, detergent, soap, or other environmental triggers have been identified.  He attempts to relieve the pruritus with Gold Bond lotion, with only minimal/mild relief.  He has not tried taking antihistamines. Mostly experiences rhinorrhea and nasal congestion, typically worse during the cold weather.  He has no history of symptoms consistent with asthma.   Assessment and plan: Allergic urticaria Unclear etiology. Skin tests to select food allergens were negative today. NSAIDs and emotional stress commonly exacerbate urticaria but are not the underlying etiology in this case. Physical urticarias are negative by history (i.e. pressure-induced, temperature, vibration, solar, etc.). History and lesions are not consistent with urticaria pigmentosa so I am not suspicious for mastocytosis. There are no concomitant symptoms concerning for anaphylaxis. We will rule out other potential etiologies with labs. For symptom relief, patient is to take oral antihistamines as directed.  The following labs have been ordered: FCeRI antibody, anti-thyroglobulin antibody, thyroid peroxidase antibody, tryptase, urea breath test, CBC, CMP, ESR, ANA, and  galactose-alpha-1,3-galactose IgE level.  The patient will be called with further recommendations after lab results have returned.  A prescription has been provided for fexofenadine, 60 mg twice daily as needed.  A prescription has been provided for ranitidine 75 mg twice daily.  Should there be a significant increase or change in symptoms, a journal is to be kept recording any foods eaten, beverages consumed, medications taken within a 6 hour period prior to the onset of symptoms, as well as record activities being performed, and environmental conditions. For any symptoms concerning for anaphylaxis, 911 is to be called immediately.  Chronic rhinitis  Fexofenadine (as above).  A prescription has been provided for fluticasone nasal spray, one spray per nostril 1-2 times daily as needed. Proper nasal spray technique has been discussed and demonstrated.  Nasal saline spray (i.e. Simply Saline) is recommended prior to medicated nasal sprays and as needed.   Meds ordered this encounter  Medications  . DISCONTD: ranitidine (ZANTAC) 150 MG tablet    Sig: Take 1 tablet (150 mg total) by mouth 2 (two) times daily.    Dispense:  60 tablet    Refill:  5  . fexofenadine (HM FEXOFENADINE HCL) 60 MG tablet    Sig: Take 1 tablet (60 mg total) by mouth 2 (two) times daily as needed for allergies or rhinitis.    Dispense:  60 tablet    Refill:  2  . ranitidine (ZANTAC) 150 MG tablet    Sig: Take 0.5 tablets (75 mg total) by mouth 2 (two) times daily.    Dispense:  15 tablet    Refill:  3  . fluticasone (FLONASE) 50 MCG/ACT nasal spray    Sig: Place 2 sprays into both nostrils daily.  Dispense:  1 g    Refill:  5    Diagnostics: Environmental skin testing: Negative despite a positive histamine control. Food allergen skin testing: Negative despite a positive histamine control.    Physical examination: Blood pressure 130/70, pulse 67, height '5\' 11"'  (1.803 m), weight 160 lb (72.6 kg), SpO2  99 %.  General: Alert, interactive, in no acute distress. HEENT: TMs pearly gray, turbinates mildly edematous without discharge, post-pharynx mildly erythematous. Neck: Supple without lymphadenopathy. Lungs: Clear to auscultation without wheezing, rhonchi or rales. CV: Normal S1, S2 without murmurs. Abdomen: Nondistended, nontender. Skin: Warm and dry, without lesions or rashes. Extremities:  No clubbing, cyanosis or edema. Neuro:   Grossly intact.  Review of systems:  Review of systems negative except as noted in HPI / PMHx or noted below: Review of Systems  Constitutional: Negative.   HENT: Negative.   Eyes: Negative.   Respiratory: Negative.   Cardiovascular: Negative.   Gastrointestinal: Negative.   Genitourinary: Negative.   Musculoskeletal: Negative.   Skin: Negative.   Neurological: Negative.   Endo/Heme/Allergies: Negative.   Psychiatric/Behavioral: Negative.     Past medical history:  Past Medical History:  Diagnosis Date  . Adenomatous polyp of colon 05/2004  . Allergic rhinitis   . Anxiety   . BPH (benign prostatic hyperplasia)   . CAD (coronary artery disease)    s/p Cypher DES to LAD and pD1 2010;  LHC was done 6/12: EF 65%, circumflex patent, mid RCA 80-90% (small and nondominant), LAD and diagonal stents patent, LAD at the origin of the first diagonal 30%, ostial D2 90%, mid 80-90% (small vessel).  Continued medical therapy was recommended.   . Carotid stenosis    dopplers 02/2012: 3-42% RICA; 87-68% LICA  . Diabetes type 2, controlled ([place])   . Diverticulosis    colon  . ED (erectile dysfunction)   . Esophageal stricture   . GERD (gastroesophageal reflux disease)   . Hemorrhoids   . Hiatal hernia   . Hyperlipidemia   . Hypertension   . IBS (irritable bowel syndrome)   . Right inguinal hernia    s/p repair in 8/12  . SVT (supraventricular tachycardia) ([facility])     Past surgical history:  Past Surgical History:  Procedure Laterality Date  . CARDIAC  CATHETERIZATION N/A 12/10/2016   Procedure: Left Heart Cath and Coronary Angiography;  Surgeon: Jettie Booze, MD;  Location: [facility];  Service: Cardiovascular;  Laterality: N/A;  . CARDIAC CATHETERIZATION N/A 12/10/2016   Procedure: Coronary Balloon Angioplasty;  Surgeon: Jettie Booze, MD;  Location: [facility];  Service: Cardiovascular;  Laterality: N/A;  . CARDIAC CATHETERIZATION N/A 12/10/2016   Procedure: Intravascular Pressure Wire/FFR Study;  Surgeon: Jettie Booze, MD;  Location: [facility];  Service: Cardiovascular;  Laterality: N/A;  . CORONARY STENT PLACEMENT  10/2008   x 2  . ELECTROPHYSIOLOGIC STUDY N/A 03/20/2015   no inducible SVT - Dr Lovena Le  . INGUINAL HERNIA REPAIR Right    RIH with ultrapro patch    Family history: Family History  Problem Relation Age of Onset  . Bladder Cancer Mother 59  . Heart attack Father 36       Deceased  . Pancreatic cancer Sister 75  . Healthy Son   . Healthy Daughter     Social history: Social History   Socioeconomic History  . Marital status: Married    Spouse name: Not on file  . Number of children: 3  .  Years of education: Not on file  . Highest education level: Not on file  Social Needs  . Financial resource strain: Not on file  . Food insecurity - worry: Not on file  . Food insecurity - inability: Not on file  . Transportation needs - medical: Not on file  . Transportation needs - non-medical: Not on file  Occupational History  . 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 Smoking status: Former Smoker    Types: Cigarettes    Last attempt to quit: 03/05/1956    Years since quitting: 61.7  . Smokeless tobacco: Never Used  . Tobacco comment: smoked in teens  Substance and Sexual Activity  . Alcohol use: No    Alcohol/week: 0.0 oz  . Drug use: No  . Sexual activity: Not on file  Other Topics Concern  . Not on file  Social History Narrative     Lives with wife in a 3 story home.  Has 2 living children.  1 passed away in a car accident.     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     Environmental History: Patient lives in a 81 year old house with carpeting throughout her life/heat.  There is a dog in the home which has access to his bedroom.  He is a non-smoker.  There is no known mold/water damage in the home.  Allergies as of 12/01/2017      Reactions   Crestor [rosuvastatin Calcium] Rash   All over body   Lovastatin Itching, Rash      Medication List        Accurate as of 12/01/17  7:08 PM. Always use your most recent med list.          amLODipine 5 MG tablet Commonly known as:  NORVASC Take 1 tablet (5 mg total) by mouth daily.   aspirin EC 81 MG tablet Take 81 mg by mouth daily after supper.   atorvastatin 10 MG tablet Commonly known as:  LIPITOR Take 1 tablet (10 mg total) by mouth daily at 6 PM.   carvedilol 6.25 MG tablet Commonly known as:  COREG TAKE 1 TABLET (6.25 MG TOTAL) BY MOUTH 2 (TWO) TIMES DAILY WITH A MEAL.   cetirizine 10 MG tablet Commonly known as:  ZYRTEC Take 1 tablet (10 mg total) by mouth daily.   fexofenadine 60 MG tablet Commonly known as:  HM FEXOFENADINE HCL Take 1 tablet (60 mg total) by mouth 2 (two) times daily as needed for allergies or rhinitis.   fluticasone 50 MCG/ACT nasal spray Commonly known as:  FLONASE Place 2 sprays into both nostrils daily.   glipiZIDE 5 MG 24 hr tablet Commonly known as:  GLUCOTROL XL Take 1 tablet (5 mg total) by mouth daily with breakfast.   glucose blood test strip Commonly known as:  ONE TOUCH ULTRA TEST Use as instructed TWICE daily E11.9   Lancets Misc Use as directed twice daily E11.9   lansoprazole 30 MG capsule Commonly known as:  PREVACID TAKE 1 CAPSULE (30 MG TOTAL) BY MOUTH DAILY AT 12 NOON.   latanoprost 0.005 % ophthalmic solution Commonly known as:   XALATAN Place 1 drop into both eyes at bedtime.   linagliptin 5 MG Tabs tablet Commonly known as:  TRADJENTA Take 1 tablet (5 mg total) by mouth daily.   losartan 100 MG tablet Commonly known as:  COZAAR  Take 100 mg by mouth daily.   metFORMIN 500 MG 24 hr tablet Commonly known as:  GLUCOPHAGE-XR Take 1 tablet (500 mg total) by mouth 2 (two) times daily.   nitroGLYCERIN 0.4 MG SL tablet Commonly known as:  NITROSTAT Place 1 tablet (0.4 mg total) under the tongue every 5 (five) minutes as needed for chest pain.   ONE TOUCH ULTRA 2 w/Device Kit Use as directed E 11.9   ranitidine 150 MG capsule Commonly known as:  ZANTAC Take 1 capsule (150 mg total) by mouth 2 (two) times daily.   ranitidine 150 MG tablet Commonly known as:  ZANTAC Take 0.5 tablets (75 mg total) by mouth 2 (two) times daily.       Known medication allergies: Allergies  Allergen Reactions  . Crestor [Rosuvastatin Calcium] Rash    All over body  . Lovastatin Itching and Rash    I appreciate the opportunity to take part in [facility] care. Please do not hesitate to contact me with questions.  Sincerely,   R. Edgar Frisk, MD

## 2017-12-01 NOTE — Patient Instructions (Addendum)
Allergic urticaria Unclear etiology. Skin tests to select food allergens were negative today. NSAIDs and emotional stress commonly exacerbate urticaria but are not the underlying etiology in this case. Physical urticarias are negative by history (i.e. pressure-induced, temperature, vibration, solar, etc.). History and lesions are not consistent with urticaria pigmentosa so I am not suspicious for mastocytosis. There are no concomitant symptoms concerning for anaphylaxis. We will rule out other potential etiologies with labs. For symptom relief, patient is to take oral antihistamines as directed.  The following labs have been ordered: FCeRI antibody, anti-thyroglobulin antibody, thyroid peroxidase antibody, tryptase, urea breath test, CBC, CMP, ESR, ANA, and galactose-alpha-1,3-galactose IgE level.  The patient will be called with further recommendations after lab results have returned.  A prescription has been provided for fexofenadine, 60 mg twice daily as needed.  A prescription has been provided for ranitidine 75 mg twice daily.  Should there be a significant increase or change in symptoms, a journal is to be kept recording any foods eaten, beverages consumed, medications taken within a 6 hour period prior to the onset of symptoms, as well as record activities being performed, and environmental conditions. For any symptoms concerning for anaphylaxis, 911 is to be called immediately.  Chronic rhinitis  Fexofenadine (as above).  A prescription has been provided for fluticasone nasal spray, one spray per nostril 1-2 times daily as needed. Proper nasal spray technique has been discussed and demonstrated.  Nasal saline spray (i.e. Simply Saline) is recommended prior to medicated nasal sprays and as needed.   When lab results have returned the patient will be called with further recommendations and follow up instructions.

## 2017-12-02 ENCOUNTER — Telehealth: Payer: Self-pay | Admitting: Allergy and Immunology

## 2017-12-02 NOTE — Telephone Encounter (Signed)
Called patient. Straight VM and it was full.

## 2017-12-02 NOTE — Telephone Encounter (Signed)
Patient saw Dr. Verlin Fester yesterday, 12-01-16. He was prescribed ranitidine and fexofenadine. He went to pick up and all that was sent in was ranitidine. He wants to to if any of these are for itching, since that is why he came in, he can't sleep at night. The dosage was also different on the prescription from what was on his AVS. He needs to talk to someone about this. His phone # [phone]. [facility] [place].

## 2017-12-02 NOTE — Telephone Encounter (Signed)
VM left requesting Pt to call back

## 2017-12-03 NOTE — Telephone Encounter (Signed)
Spoke with patient and reviewed Ranitidine 150 mg and taking twice daily. Explained to patient that Fexofenadine 60 mg [facility], but may have been ordered if not in stock and may not be covered by his insurance plan. Informed patient that these medications were prescribed for his itching.   Informed patient I would [facility] and check on Fexofenadine for him.  [facility]ait[facility] like [facility] get [facility] for him even if he has to pay for it as OTC.  [facility] [place] and spoke to [place] in the pharmacy.  Fexofenadine 60 mg had to be ordered and was available today for patient to pick up.  It is not covered by his  Buyer, retail.  Cost will be $37 for 60 tablets.

## 2017-12-05 NOTE — Telephone Encounter (Signed)
-----   Message from [name], MD sent at 10/31/2017  1:14 PM EST ----- Regarding: DM I have reviewed your information, and he probably has now too tight control on current meds, and recent wt loss, even though his a1c at last visit was 9.6.    We will call to ask him to reduce the Glipizide ER to 5 mg daily, and ask him to make ROV in 6 wks for recheck.  I will do the rx adjustment today  Shirron to let pt know above and need ROV in 6 wks  OK to make changes in the future as you see fit.  Thanks. ----- Message ----- From: Sherlene Shams, FNP Sent: 10/31/2017   1:06 PM To: [name], MD  Dr. Jenny Reichmann, I saw this patient today for acute care visit.  He mentioned that he often gets low blood sugars ( around 80) after he works outside for periods of time and he has to eat "quick snacks" to feel better. Fasting sugars are averaging 120-130. Sugars about an hour after eating are 160. I told him I would let you know in case you wanted to make a change to his medications before his appointment next month.  In the future, would you prefer I make changes and have patients follow-up with you? Or just route information to you?  Thanks- Jodi Mourning, FNP

## 2017-12-05 NOTE — Telephone Encounter (Signed)
Shirron, this was in my box and I am not sure if Dr. Jenny Reichmann meant to route to you instead of me. Just wanted to make sure appropriate follow-up was done. Thanks-

## 2017-12-11 ENCOUNTER — Telehealth: Payer: Self-pay | Admitting: Allergy and Immunology

## 2017-12-11 LAB — H. PYLORI BREATH TEST: H pylori Breath Test: NEGATIVE

## 2017-12-11 LAB — THYROID PEROXIDASE ANTIBODY: Thyroperoxidase Ab SerPl-aCnc: 10 IU/mL (ref 0–34)

## 2017-12-11 LAB — COMPREHENSIVE METABOLIC PANEL
ALT: 23 IU/L (ref 0–44)
AST: 21 IU/L (ref 0–40)
Albumin/Globulin Ratio: 1.8 (ref 1.2–2.2)
Albumin: 4.4 g/dL (ref 3.5–4.7)
Alkaline Phosphatase: 53 IU/L (ref 39–117)
BUN/Creatinine Ratio: 18 (ref 10–24)
BUN: 17 mg/dL (ref 8–27)
Bilirubin Total: 0.4 mg/dL (ref 0.0–1.2)
CO2: 22 mmol/L (ref 20–29)
Calcium: 9.7 mg/dL (ref 8.6–10.2)
Chloride: 106 mmol/L (ref 96–106)
Creatinine, Ser: 0.93 mg/dL (ref 0.76–1.27)
GFR calc Af Amer: 87 mL/min/{1.73_m2} (ref >59)
GFR calc non Af Amer: 76 mL/min/{1.73_m2} (ref >59)
Globulin, Total: 2.5 g/dL (ref 1.5–4.5)
Glucose: 112 mg/dL — ABNORMAL HIGH (ref 65–99)
Potassium: 4.3 mmol/L (ref 3.5–5.2)
Sodium: 145 mmol/L — ABNORMAL HIGH (ref 134–144)
Total Protein: 6.9 g/dL (ref 6.0–8.5)

## 2017-12-11 LAB — ALPHA-GAL PANEL
Alpha Gal IgE*: 22.4 kU/L — ABNORMAL HIGH (ref <0.10)
Beef (Bos spp) IgE: 5.61 kU/L — ABNORMAL HIGH (ref <0.35)
Class Interpretation: 2
Class Interpretation: 2
Class Interpretation: 3
Lamb/Mutton (Ovis spp) IgE: 1.71 kU/L — ABNORMAL HIGH (ref <0.35)
Pork (Sus spp) IgE: 3.24 kU/L — ABNORMAL HIGH (ref <0.35)

## 2017-12-11 LAB — CBC WITH DIFFERENTIAL/PLATELET

## 2017-12-11 LAB — ANA W/REFLEX IF POSITIVE: Anti Nuclear Antibody(ANA): NEGATIVE

## 2017-12-11 LAB — THYROGLOBULIN ANTIBODY: Thyroglobulin Antibody: <1 IU/mL (ref 0.0–0.9)

## 2017-12-11 LAB — TRYPTASE: Tryptase: 6.1 ug/L (ref 2.2–13.2)

## 2017-12-11 LAB — CHRONIC URTICARIA: cu index: 3 (ref <10)

## 2017-12-11 LAB — SEDIMENTATION RATE

## 2017-12-11 NOTE — Telephone Encounter (Signed)
Call in a prescription for montelukast 10 mg daily.  Continue H1/H2 receptor blockade, titrating to the lowest effective dose necessary to suppress urticaria.

## 2017-12-11 NOTE — Telephone Encounter (Signed)
Pt called and wanted to know the result of blood work. [phone] home. [phone] cell.

## 2017-12-11 NOTE — Telephone Encounter (Signed)
I spoke with Keith Herrera and advised him that labs are not back just yet. I did let him know that they should be back tomorrow, but Keith Herrera will not be back in the office until Monday. He was fine with that. I did go over his medications with him. He is taking 180 mg fexofenadine 2 times daily and ranitidine 150 mg 2 times daily. I did explain to him what Keith Herrera had wanted him to do. He says that if the higher dosages were not doing anything for him then it was very unlikely that the lower dosages that Keith Herrera wanted him taking would change anything. He is still uncomfortable and itching. I did explain to him in detail to make sure he is taking all of the medications as instructed by Keith Herrera until he came back in for a follow up. I told him again about the labs and told him that someone should be calling him on Monday with those and any recommendations that Keith Herrera has. Keith Herrera would like to know if Keith Herrera had any further recommendations as far as helping with the itching? I told him that I would send a message to the doctor and give him a call back. He said okay and was thankful for this information.

## 2017-12-12 ENCOUNTER — Ambulatory Visit (INDEPENDENT_AMBULATORY_CARE_PROVIDER_SITE_OTHER): Payer: Medicare HMO | Admitting: Internal Medicine

## 2017-12-12 ENCOUNTER — Encounter: Payer: Self-pay | Admitting: Internal Medicine

## 2017-12-12 VITALS — BP 124/86 | HR 64 | Temp 97.7°F | Ht 71.0 in | Wt 164.0 lb

## 2017-12-12 DIAGNOSIS — K5900 Constipation, unspecified: Secondary | ICD-10-CM

## 2017-12-12 DIAGNOSIS — I10 Essential (primary) hypertension: Secondary | ICD-10-CM

## 2017-12-12 DIAGNOSIS — E1159 Type 2 diabetes mellitus with other circulatory complications: Secondary | ICD-10-CM

## 2017-12-12 DIAGNOSIS — L299 Pruritus, unspecified: Secondary | ICD-10-CM

## 2017-12-12 DIAGNOSIS — L500 Allergic urticaria: Secondary | ICD-10-CM

## 2017-12-12 DIAGNOSIS — E785 Hyperlipidemia, unspecified: Secondary | ICD-10-CM

## 2017-12-12 LAB — POCT GLYCOSYLATED HEMOGLOBIN (HGB A1C): Hemoglobin A1C: 6.5

## 2017-12-12 MED ORDER — MONTELUKAST SODIUM 10 MG PO TABS: 10.0000 mg | ORAL_TABLET | Freq: Every day | ORAL | 5 refills | Status: DC

## 2017-12-12 MED ORDER — METFORMIN HCL ER 500 MG PO TB24: 500.0000 mg | ORAL_TABLET | Freq: Every day | ORAL | 3 refills | Status: DC

## 2017-12-12 MED ORDER — HYDROXYZINE HCL 10 MG PO TABS: 10.0000 mg | ORAL_TABLET | Freq: Three times a day (TID) | ORAL | 3 refills | Status: DC | PRN

## 2017-12-12 MED ORDER — NITROGLYCERIN 0.4 MG SL SUBL: 0.4000 mg | SUBLINGUAL_TABLET | SUBLINGUAL | 1 refills | Status: DC | PRN

## 2017-12-12 MED ORDER — ZOSTER VAC RECOMB ADJUVANTED 50 MCG/0.5ML IM SUSR: 0.5000 mL | Freq: Once | INTRAMUSCULAR | 1 refills | Status: AC

## 2017-12-12 MED ORDER — METHYLPREDNISOLONE ACETATE 80 MG/ML IJ SUSP
80.0000 mg | Freq: Once | INTRAMUSCULAR | Status: AC
  Administered 2017-12-12: 80 mg via INTRAMUSCULAR

## 2017-12-12 MED ORDER — POLYETHYLENE GLYCOL 3350 17 G PO PACK: 17.0000 g | PACK | Freq: Every day | ORAL | 3 refills | Status: DC

## 2017-12-12 NOTE — Patient Instructions (Addendum)
You had the steroid shot today  Please take all new medication as prescribed - the miralax for constipation daily as needed, atarax for itching as needed  Your Shingles prescription was sent to the pharmacy  Please call if you feel you need the Dermatology referral for the itching  Your A1c was much improved today at 6.5 -   OK to cut back the metformin to one in the AM only  Please continue all other medications as before, and refills have been done if requested.  Please have the pharmacy call with any other refills you may need.  Please continue your efforts at being more active, low cholesterol diet, and weight control.  Please keep your appointments with your specialists as you may have planned  OK to cancel the appt next week  Please return in 3 months, or sooner if needed, with Lab testing done 3-5 days before

## 2017-12-12 NOTE — Progress Notes (Signed)
Subjective:    Patient ID: Keith Herrera, male    DOB: [birth year], 84 y.o.   MRN: [id number]  HPI  Here to f/u; overall doing ok,  Pt denies chest pain, increasing sob or doe, wheezing, orthopnea, PND, increased LE swelling, palpitations, dizziness or syncope.  Pt denies new neurological symptoms such as new headache, or facial or extremity weakness or numbness.  Pt denies polydipsia, polyuria, or low sugar episode.  Pt states overall good compliance with meds, mostly trying to follow appropriate diet, with wt overall stable,  but little exercise however.  Cbg's as low as 56 with skipping meal, but usually 90-100's  Also  C/o increased gas, behlching constipation, cant say the zantac helped much on top of PPI from dec 14 visit; Also, Worst complaint today is  itching torso without rash and cant sleep at night, allergist eval no help despite testing and singulair trial.  No other new complaints or interval hx Past Medical History:  Diagnosis Date  . Adenomatous polyp of colon 05/2004  . Allergic rhinitis   . Anxiety   . BPH (benign prostatic hyperplasia)   . CAD (coronary artery disease)    s/p Cypher DES to LAD and pD1 2010;  LHC was done 6/12: EF 65%, circumflex patent, mid RCA 80-90% (small and nondominant), LAD and diagonal stents patent, LAD at the origin of the first diagonal 30%, ostial D2 90%, mid 80-90% (small vessel).  Continued medical therapy was recommended.   . Carotid stenosis    dopplers 02/2012: 4-09% RICA; 81-19% LICA  . Diabetes type 2, controlled ([place])   . Diverticulosis    colon  . ED (erectile dysfunction)   . Esophageal stricture   . GERD (gastroesophageal reflux disease)   . Hemorrhoids   . Hiatal hernia   . Hyperlipidemia   . Hypertension   . IBS (irritable bowel syndrome)   . Right inguinal hernia    s/p repair in 8/12  . SVT (supraventricular tachycardia) (HCC)    Past Surgical History:  Procedure Laterality Date  . CARDIAC CATHETERIZATION N/A 12/10/2016     Procedure: Left Heart Cath and Coronary Angiography;  Surgeon: Jettie Booze, MD;  Location: [facility];  Service: Cardiovascular;  Laterality: N/A;  . CARDIAC CATHETERIZATION N/A 12/10/2016   Procedure: Coronary Balloon Angioplasty;  Surgeon: Jettie Booze, MD;  Location: [facility];  Service: Cardiovascular;  Laterality: N/A;  . CARDIAC CATHETERIZATION N/A 12/10/2016   Procedure: Intravascular Pressure Wire/FFR Study;  Surgeon: Jettie Booze, MD;  Location: [facility];  Service: Cardiovascular;  Laterality: N/A;  . CORONARY STENT PLACEMENT  10/2008   x 2  . ELECTROPHYSIOLOGIC STUDY N/A 03/20/2015   no inducible SVT - Dr Lovena Le  . INGUINAL HERNIA REPAIR Right    RIH with ultrapro patch    reports that he quit smoking about 61 years ago. His smoking use included cigarettes. he has never used smokeless tobacco. He reports that he does not drink alcohol or use drugs. family history includes Bladder Cancer (age of onset: 35) in his mother; Healthy in his daughter and son; Heart attack (age of onset: 29) in his father; Pancreatic cancer (age of onset: 52) in his sister. Allergies  Allergen Reactions  . Crestor [Rosuvastatin Calcium] Rash    All over body  . Lovastatin Itching and Rash   Current Outpatient Medications on File Prior to Visit  Medication Sig Dispense Refill  . amLODipine (NORVASC) 5 MG tablet  Take 1 tablet (5 mg total) by mouth daily. 30 tablet 11  . aspirin EC 81 MG tablet Take 81 mg by mouth daily after supper.    Marland Kitchen atorvastatin (LIPITOR) 10 MG tablet Take 1 tablet (10 mg total) by mouth daily at 6 PM. 30 tablet 5  . Blood Glucose Monitoring Suppl (ONE TOUCH ULTRA 2) w/Device KIT Use as directed E 11.9 1 each 0  . carvedilol (COREG) 6.25 MG tablet TAKE 1 TABLET (6.25 MG TOTAL) BY MOUTH 2 (TWO) TIMES DAILY WITH A MEAL. 180 tablet 3  . cetirizine (ZYRTEC) 10 MG tablet TAKE 1 TABLET BY MOUTH EVERY DAY 30 tablet 2  . fexofenadine (HM  FEXOFENADINE HCL) 60 MG tablet Take 1 tablet (60 mg total) by mouth 2 (two) times daily as needed for allergies or rhinitis. 60 tablet 2  . fluticasone (FLONASE) 50 MCG/ACT nasal spray Place 2 sprays into both nostrils daily. 1 g 5  . glipiZIDE (GLUCOTROL XL) 5 MG 24 hr tablet Take 1 tablet (5 mg total) by mouth daily with breakfast. 90 tablet 3  . glucose blood (ONE TOUCH ULTRA TEST) test strip Use as instructed TWICE daily E11.9 200 each 3  . Lancets MISC Use as directed twice daily E11.9 200 each 3  . lansoprazole (PREVACID) 30 MG capsule TAKE 1 CAPSULE (30 MG TOTAL) BY MOUTH DAILY AT 12 NOON. 90 capsule 3  . latanoprost (XALATAN) 0.005 % ophthalmic solution Place 1 drop into both eyes at bedtime.  12  . linagliptin (TRADJENTA) 5 MG TABS tablet Take 1 tablet (5 mg total) by mouth daily. 90 tablet 3  . losartan (COZAAR) 100 MG tablet Take 100 mg by mouth daily.    . ranitidine (ZANTAC) 150 MG tablet Take 0.5 tablets (75 mg total) by mouth 2 (two) times daily. 15 tablet 3  . montelukast (SINGULAIR) 10 MG tablet Take 1 tablet (10 mg total) by mouth daily. 30 tablet 5   No current facility-administered medications on file prior to visit.    Review of Systems  Constitutional: Negative for other unusual diaphoresis or sweats HENT: Negative for ear discharge or swelling Eyes: Negative for other worsening visual disturbances Respiratory: Negative for stridor or other swelling  Gastrointestinal: Negative for worsening distension or other blood Genitourinary: Negative for retention or other urinary change Musculoskeletal: Negative for other MSK pain or swelling Skin: Negative for color change or other new lesions Neurological: Negative for worsening tremors and other numbness  Psychiatric/Behavioral: Negative for worsening agitation or other fatigue' \All'  other system neg per pt    Objective:   Physical Exam BP 124/86   Pulse 64   Temp 97.7 F (36.5 C) (Oral)   Ht '5\' 11"'  (1.803 m)   Wt 164  lb (74.4 kg)   SpO2 96%   BMI 22.87 kg/m  VS noted,  Constitutional: Pt appears in NAD HENT: Head: NCAT.  Right Ear: External ear normal.  Left Ear: External ear normal.  Eyes: . Pupils are equal, round, and reactive to light. Conjunctivae and EOM are normal Nose: without d/c or deformity Neck: Neck supple. Gross normal ROM Cardiovascular: Normal rate and regular rhythm.   Pulmonary/Chest: Effort normal and breath sounds without rales or wheezing.  Abd:  Soft, NT, ND, + BS, no organomegaly Neurological: Pt is alert. At baseline orientation, motor grossly intact Skin: Skin is warm. No rashes, other new lesions, no LE edema Psychiatric: Pt behavior is normal without agitation  No other exam findings  Lab Results  Component Value Date   WBC CANCELED 12/01/2017   HGB 14.3 08/19/2017   HCT 42.2 08/19/2017   PLT 215.0 08/19/2017   GLUCOSE 112 (H) 12/01/2017   CHOL 130 08/19/2017   TRIG (H) 08/19/2017    244.0 Triglyceride is over 400; calculations on Lipids are invalid.   HDL 28.40 (L) 08/19/2017   LDLDIRECT 66.0 08/19/2017   LDLCALC 63 12/21/2015   ALT 23 12/01/2017   AST 21 12/01/2017   NA 145 (H) 12/01/2017   K 4.3 12/01/2017   CL 106 12/01/2017   CREATININE 0.93 12/01/2017   BUN 17 12/01/2017   CO2 22 12/01/2017   TSH 2.03 08/19/2017   PSA 2.49 12/21/2012   INR 1.15 12/10/2016   HGBA1C 9.6 (H) 08/19/2017   MICROALBUR 3.4 (H) 08/19/2017   POCT glycosylated hemoglobin (Hb A1C)  Order: [id number]  Status:  Final result Visible to patient:  No (Not Released) Dx:  Type 2 diabetes mellitus with vascula...  Component 09:44  Hemoglobin A1C 6.5            Assessment & Plan:

## 2017-12-12 NOTE — Telephone Encounter (Signed)
Montelukast sent to pharmacy. Left message to advise pt.

## 2017-12-12 NOTE — Telephone Encounter (Signed)
Patient notified

## 2017-12-12 NOTE — Addendum Note (Signed)
Addended by: Gara Kroner L on: 12/12/2017 08:58 AM   Modules accepted: Orders

## 2017-12-14 NOTE — Assessment & Plan Note (Signed)
Lab Results  Component Value Date   LDLCALC 63 12/21/2015  stable overall by history and exam, recent data reviewed with pt, and pt to continue medical treatment as before,  to f/u any worsening symptoms or concerns

## 2017-12-14 NOTE — Assessment & Plan Note (Signed)
Ok for miralax daily prn 

## 2017-12-14 NOTE — Assessment & Plan Note (Signed)
stable overall by history and exam, recent data reviewed with pt, and pt to continue medical treatment as before,  to f/u any worsening symptoms or concerns BP Readings from Last 3 Encounters:  12/12/17 124/86  12/01/17 130/70  10/31/17 132/64

## 2017-12-14 NOTE — Assessment & Plan Note (Signed)
For depomedrol IM 80, and atarax prn for home

## 2017-12-14 NOTE — Assessment & Plan Note (Signed)
stable overall by history and exam, recent data reviewed with pt, and pt to continue medical treatment as before,  to f/u any worsening symptoms or concerns Lab Results  Component Value Date   HGBA1C 6.5 12/12/2017

## 2017-12-16 ENCOUNTER — Other Ambulatory Visit: Payer: Self-pay

## 2017-12-16 MED ORDER — NITROGLYCERIN 0.4 MG SL SUBL: 0.4000 mg | SUBLINGUAL_TABLET | SUBLINGUAL | 1 refills | Status: DC | PRN

## 2017-12-17 ENCOUNTER — Ambulatory Visit: Payer: Medicare HMO | Admitting: Internal Medicine

## 2017-12-29 ENCOUNTER — Other Ambulatory Visit: Payer: Self-pay

## 2017-12-29 MED ORDER — ATORVASTATIN CALCIUM 10 MG PO TABS: 10.0000 mg | ORAL_TABLET | Freq: Every day | ORAL | 5 refills | Status: DC

## 2018-01-28 DIAGNOSIS — H21233 Degeneration of iris (pigmentary), bilateral: Secondary | ICD-10-CM | POA: Diagnosis not present

## 2018-02-13 ENCOUNTER — Ambulatory Visit (INDEPENDENT_AMBULATORY_CARE_PROVIDER_SITE_OTHER): Payer: Medicare HMO | Admitting: Internal Medicine

## 2018-02-13 ENCOUNTER — Encounter: Payer: Self-pay | Admitting: Internal Medicine

## 2018-02-13 VITALS — BP 128/82 | HR 65 | Temp 97.8°F | Ht 71.0 in | Wt 159.0 lb

## 2018-02-13 DIAGNOSIS — K219 Gastro-esophageal reflux disease without esophagitis: Secondary | ICD-10-CM | POA: Diagnosis not present

## 2018-02-13 DIAGNOSIS — E1159 Type 2 diabetes mellitus with other circulatory complications: Secondary | ICD-10-CM | POA: Diagnosis not present

## 2018-02-13 DIAGNOSIS — J310 Chronic rhinitis: Secondary | ICD-10-CM | POA: Diagnosis not present

## 2018-02-13 DIAGNOSIS — M79602 Pain in left arm: Secondary | ICD-10-CM | POA: Diagnosis not present

## 2018-02-13 DIAGNOSIS — K2970 Gastritis, unspecified, without bleeding: Secondary | ICD-10-CM | POA: Diagnosis not present

## 2018-02-13 DIAGNOSIS — L500 Allergic urticaria: Secondary | ICD-10-CM

## 2018-02-13 LAB — POCT GLYCOSYLATED HEMOGLOBIN (HGB A1C): Hemoglobin A1C: 6.3

## 2018-02-13 MED ORDER — RANITIDINE HCL 150 MG PO TABS: 75.0000 mg | ORAL_TABLET | Freq: Two times a day (BID) | ORAL | 3 refills | Status: DC

## 2018-02-13 MED ORDER — METHYLPREDNISOLONE ACETATE 80 MG/ML IJ SUSP
80.0000 mg | Freq: Once | INTRAMUSCULAR | Status: AC
  Administered 2018-02-13: 80 mg via INTRAMUSCULAR

## 2018-02-13 MED ORDER — ONDANSETRON HCL 4 MG PO TABS: 4.0000 mg | ORAL_TABLET | Freq: Three times a day (TID) | ORAL | 0 refills | Status: DC | PRN

## 2018-02-13 MED ORDER — ZOSTER VAC RECOMB ADJUVANTED 50 MCG/0.5ML IM SUSR: 0.5000 mL | Freq: Once | INTRAMUSCULAR | 1 refills | Status: AC

## 2018-02-13 MED ORDER — PREDNISONE 10 MG PO TABS: ORAL_TABLET | ORAL | 0 refills | Status: DC

## 2018-02-13 MED ORDER — FEXOFENADINE HCL 60 MG PO TABS: 60.0000 mg | ORAL_TABLET | Freq: Two times a day (BID) | ORAL | 2 refills | Status: DC | PRN

## 2018-02-13 NOTE — Patient Instructions (Addendum)
You had the steroid shot today  Please take all new medication as prescribed - the prednisone  [facility]  Please also pick up a left wrist splint to wear at night only to see if the left arm pain improves  Please take all new medication as prescribed - the Zantac 150 mg twice per day, and nausea medication if needed  Please call in 1-2 weeks if your stomach is not improved, as we may need to have you see Dr Fuller Plan  Your A1c was normal today  Please continue all other medications as before, and refills have been done if requested.  Please have the pharmacy call with any other refills you may need.  Please continue your efforts at being more active, low cholesterol diet, and weight control  Please keep your appointments with your specialists as you may have planned  OK to cancel the Apr 25 appointment and return in 3 months instead

## 2018-02-13 NOTE — Progress Notes (Signed)
Subjective:    Patient ID: Keith Herrera, male    DOB: 12-Oct-1932, 84 y.o.   MRN: [id number]  HPI  Here to f/u; overall doing ok,  Pt denies chest pain, increasing sob or doe, wheezing, orthopnea, PND, increased LE swelling, palpitations, dizziness or syncope.  Pt denies new neurological symptoms such as new headache, or facial or extremity weakness or numbness.  Pt denies polydipsia, polyuria, or low sugar episode.  Pt states overall good compliance with meds, mostly trying to follow appropriate diet, with wt overall decreased with less calories.  Wt Readings from Last 3 Encounters:  02/13/18 159 lb (72.1 kg)  12/12/17 164 lb (74.4 kg)  12/01/17 160 lb (72.6 kg)  Also has c/o left arm aching numbness mild to mod from elbow to hand intermittent without weakness, neck pain.  Also has nausea with belching making better off an on worse mild in the past wk.  Also has marked itching worse at night with red rash that seems to come and go, none at the moment, no swelling  Can hardly sleep most night  Does have several wks ongoing nasal allergy symptoms with clearish congestion, itch and sneezing, without fever, pain, ST, cough, swelling or wheezing. Past Medical History:  Diagnosis Date  . Adenomatous polyp of colon 05/2004  . Allergic rhinitis   . Anxiety   . BPH (benign prostatic hyperplasia)   . CAD (coronary artery disease)    s/p Cypher DES to LAD and pD1 2010;  LHC was done 6/12: EF 65%, circumflex patent, mid RCA 80-90% (small and nondominant), LAD and diagonal stents patent, LAD at the origin of the first diagonal 30%, ostial D2 90%, mid 80-90% (small vessel).  Continued medical therapy was recommended.   . Carotid stenosis    dopplers 02/2012: 5-62% RICA; 13-08% LICA  . Diabetes type 2, controlled ([place])   . Diverticulosis    colon  . ED (erectile dysfunction)   . Esophageal stricture   . GERD (gastroesophageal reflux disease)   . Hemorrhoids   . Hiatal hernia   . Hyperlipidemia   .  Hypertension   . IBS (irritable bowel syndrome)   . Right inguinal hernia    s/p repair in 8/12  . SVT (supraventricular tachycardia) (HCC)    Past Surgical History:  Procedure Laterality Date  . CARDIAC CATHETERIZATION N/A 12/10/2016   Procedure: Left Heart Cath and Coronary Angiography;  Surgeon: Jettie Booze, MD;  Location: [facility];  Service: Cardiovascular;  Laterality: N/A;  . CARDIAC CATHETERIZATION N/A 12/10/2016   Procedure: Coronary Balloon Angioplasty;  Surgeon: Jettie Booze, MD;  Location: [facility];  Service: Cardiovascular;  Laterality: N/A;  . CARDIAC CATHETERIZATION N/A 12/10/2016   Procedure: Intravascular Pressure Wire/FFR Study;  Surgeon: Jettie Booze, MD;  Location: [facility];  Service: Cardiovascular;  Laterality: N/A;  . CORONARY STENT PLACEMENT  10/2008   x 2  . ELECTROPHYSIOLOGIC STUDY N/A 03/20/2015   no inducible SVT - Dr Lovena Le  . INGUINAL HERNIA REPAIR Right    RIH with ultrapro patch    reports that he quit smoking about 61 years ago. His smoking use included cigarettes. He has never used smokeless tobacco. He reports that he does not drink alcohol or use drugs. family history includes Bladder Cancer (age of onset: 24) in his mother; Healthy in his daughter and son; Heart attack (age of onset: 72) in his father; Pancreatic cancer (age of onset: 40) in his  sister. Allergies  Allergen Reactions  . Crestor [Rosuvastatin Calcium] Rash    All over body  . Lovastatin Itching and Rash   Current Outpatient Medications on File Prior to Visit  Medication Sig Dispense Refill  . amLODipine (NORVASC) 5 MG tablet Take 1 tablet (5 mg total) by mouth daily. 30 tablet 11  . aspirin EC 81 MG tablet Take 81 mg by mouth daily after supper.    Marland Kitchen atorvastatin (LIPITOR) 10 MG tablet Take 1 tablet (10 mg total) by mouth daily at 6 PM. 30 tablet 5  . Blood Glucose Monitoring Suppl (ONE TOUCH ULTRA 2) w/Device KIT Use as directed E 11.9  1 each 0  . carvedilol (COREG) 6.25 MG tablet TAKE 1 TABLET (6.25 MG TOTAL) BY MOUTH 2 (TWO) TIMES DAILY WITH A MEAL. 180 tablet 3  . glipiZIDE (GLUCOTROL XL) 5 MG 24 hr tablet Take 1 tablet (5 mg total) by mouth daily with breakfast. 90 tablet 3  . glucose blood (ONE TOUCH ULTRA TEST) test strip Use as instructed TWICE daily E11.9 200 each 3  . hydrOXYzine (ATARAX/VISTARIL) 10 MG tablet Take 1 tablet (10 mg total) by mouth 3 (three) times daily as needed. 60 tablet 3  . Lancets MISC Use as directed twice daily E11.9 200 each 3  . lansoprazole (PREVACID) 30 MG capsule TAKE 1 CAPSULE (30 MG TOTAL) BY MOUTH DAILY AT 12 NOON. 90 capsule 3  . latanoprost (XALATAN) 0.005 % ophthalmic solution Place 1 drop into both eyes at bedtime.  12  . linagliptin (TRADJENTA) 5 MG TABS tablet Take 1 tablet (5 mg total) by mouth daily. 90 tablet 3  . losartan (COZAAR) 100 MG tablet Take 100 mg by mouth daily.    . metFORMIN (GLUCOPHAGE-XR) 500 MG 24 hr tablet Take 1 tablet (500 mg total) by mouth daily with breakfast. 90 tablet 3  . montelukast (SINGULAIR) 10 MG tablet Take 1 tablet (10 mg total) by mouth daily. 30 tablet 5  . nitroGLYCERIN (NITROSTAT) 0.4 MG SL tablet Place 1 tablet (0.4 mg total) under the tongue every 5 (five) minutes as needed for chest pain. 40 tablet 1  . polyethylene glycol (MIRALAX / GLYCOLAX) packet Take 17 g by mouth daily. 72 each 3   No current facility-administered medications on file prior to visit.    Review of Systems  Constitutional: Negative for other unusual diaphoresis or sweats HENT: Negative for ear discharge or swelling Eyes: Negative for other worsening visual disturbances Respiratory: Negative for stridor or other swelling  Gastrointestinal: Negative for worsening distension or other blood Genitourinary: Negative for retention or other urinary change Musculoskeletal: Negative for other MSK pain or swelling Skin: Negative for color change or other new  lesions Neurological: Negative for worsening tremors and other numbness  Psychiatric/Behavioral: Negative for worsening agitation or other fatigue All other system neg per pt    Objective:   Physical Exam BP 128/82   Pulse 65   Temp 97.8 F (36.6 C) (Oral)   Ht '5\' 11"'  (1.803 m)   Wt 159 lb (72.1 kg)   SpO2 98%   BMI 22.18 kg/m  VS noted,  Constitutional: Pt appears in NAD HENT: Head: NCAT.  Right Ear: External ear normal.  Left Ear: External ear normal.  Eyes: . Pupils are equal, round, and reactive to light. Conjunctivae and EOM are normal Nose: without d/c or deformity Neck: Neck supple. Gross normal ROM Cardiovascular: Normal rate and regular rhythm.   Pulmonary/Chest: Effort normal and breath sounds  without rales or wheezing.  Abd:  Soft, NT, ND, + BS, no organomegaly Neurological: Pt is alert. At baseline orientation, motor grossly intact Skin: Skin is warm. No rashes, other new lesions, no LE edema Psychiatric: Pt behavior is normal without agitation ,mild nervous No other exam findings  POCT glycosylated hemoglobin (Hb A1C) .  Component 10:04  Hemoglobin A1C 6.3           Assessment & Plan:

## 2018-02-15 DIAGNOSIS — M79602 Pain in left arm: Secondary | ICD-10-CM | POA: Insufficient documentation

## 2018-02-15 NOTE — Assessment & Plan Note (Addendum)
No current rash, but with typical hx, for depomedrol IM 80, predpac asd,  to f/u any worsening symptoms or concerns  Note:  Total time for pt hx, exam, review of record with pt in the room, determination of diagnoses and plan for further eval and tx is > 40 min, with over 50% spent in coordination and counseling of patient including the differential dx, tx, further evaluation and other management of allergic urticaria, gerd, left arm pain, chronic rhinitis and DM

## 2018-02-15 NOTE — Assessment & Plan Note (Signed)
stable overall by history and exam, recent data reviewed with pt, and pt to continue medical treatment as before,  to f/u any worsening symptoms or concerns Lab Results  Component Value Date   HGBA1C 6.3 02/13/2018

## 2018-02-15 NOTE — Assessment & Plan Note (Signed)
C/w possible ulnar neuritis vs CTS, for trial left wrist splint advised

## 2018-02-15 NOTE — Assessment & Plan Note (Signed)
Also likely for help with steroid tx today,  to f/u any worsening symptoms or concerns

## 2018-02-15 NOTE — Assessment & Plan Note (Addendum)
Has hx of IBS and gastritis, c/o symptoms despite good compliance with lasoprazole, add zantac  And zofran prn, consider f/u GI if not improved

## 2018-02-24 ENCOUNTER — Telehealth: Payer: Self-pay | Admitting: Internal Medicine

## 2018-02-24 MED ORDER — BLOOD GLUCOSE METER KIT: PACK | 0 refills | Status: AC

## 2018-02-24 NOTE — Telephone Encounter (Signed)
Copied from [place] [phone]. Topic: Quick Communication - Rx Refill/Question >> Feb 24, 2018  8:55 AM Arletha Grippe wrote: Medication:  new diabetic meter - the one he has went haywire  Has the patient contacted their pharmacy? No. (Agent: If no, request that the patient contact the pharmacy for the refill.) Preferred Pharmacy (with phone number [facility] street [facility] [place]  Agent: Please be advised that RX refills may take up to 3 business days. We ask that you follow-up with your pharmacy. Pt says that his meter will check 200 one minute and a few mins later is will be 120

## 2018-02-24 NOTE — Telephone Encounter (Signed)
Patient is requesting a new meter- can we send in another meter for patient. One Touch Ultra 2 looks like what he has been using.

## 2018-03-12 ENCOUNTER — Ambulatory Visit: Payer: Medicare HMO | Admitting: Internal Medicine

## 2018-03-18 ENCOUNTER — Other Ambulatory Visit: Payer: Self-pay | Admitting: Internal Medicine

## 2018-03-27 ENCOUNTER — Encounter: Payer: Self-pay | Admitting: Cardiology

## 2018-03-27 ENCOUNTER — Ambulatory Visit: Payer: Medicare HMO | Admitting: Cardiology

## 2018-03-27 VITALS — BP 130/68 | HR 57 | Ht 71.0 in | Wt 159.0 lb

## 2018-03-27 DIAGNOSIS — I2510 Atherosclerotic heart disease of native coronary artery without angina pectoris: Secondary | ICD-10-CM

## 2018-03-27 DIAGNOSIS — E785 Hyperlipidemia, unspecified: Secondary | ICD-10-CM | POA: Diagnosis not present

## 2018-03-27 DIAGNOSIS — I471 Supraventricular tachycardia: Secondary | ICD-10-CM | POA: Diagnosis not present

## 2018-03-27 DIAGNOSIS — I10 Essential (primary) hypertension: Secondary | ICD-10-CM

## 2018-03-27 DIAGNOSIS — R002 Palpitations: Secondary | ICD-10-CM | POA: Diagnosis not present

## 2018-03-27 LAB — MAGNESIUM: MAGNESIUM: 1.9 mg/dL (ref 1.6–2.3)

## 2018-03-27 LAB — BASIC METABOLIC PANEL
BUN / CREAT RATIO: 19 (ref 10–24)
BUN: 19 mg/dL (ref 8–27)
CHLORIDE: 103 mmol/L (ref 96–106)
CO2: 24 mmol/L (ref 20–29)
Calcium: 9.6 mg/dL (ref 8.6–10.2)
Creatinine, Ser: 1.02 mg/dL (ref 0.76–1.27)
GFR calc non Af Amer: 67 mL/min/{1.73_m2} (ref >59)
GFR, EST AFRICAN AMERICAN: 78 mL/min/{1.73_m2} (ref >59)
Glucose: 220 mg/dL — ABNORMAL HIGH (ref 65–99)
Potassium: 4.1 mmol/L (ref 3.5–5.2)
SODIUM: 140 mmol/L (ref 134–144)

## 2018-03-27 NOTE — Progress Notes (Signed)
Cardiology Office Note:    Date:  03/27/2018   ID:  Keith Herrera, DOB [birth year], MRN [id number]  PCP:  [name], MD  Cardiologist:  Sherren Mocha, MD  Referring MD: [name], MD   Chief Complaint  Patient presents with  . Palpitations    History of Present Illness:    Keith Herrera is a 84 y.o. male with a past medical history significant for CAD status post stenting of the LAD and diagonal in 2010 and angioplasty of first diagonal 11/2006, SVT, hypertension, type 2 diabetes, hyperlipidemia and GERD.  In January 2018 the patient presented with chest pain and a stress test showed inferior ischemia and he underwent cardiac catheterization  Patency of his previous stent sites, but severe stenosis in the first diagonal treated with balloon angioplasty alone. There was moderate stenosis in the LAD which was negative for ischemia. His LV function was normal and he was initially treated with aspirin and clopidogrel, but the patient discontinued clopidogrel because he needed eye surgery for treatment of glaucoma. He did well with surgery and had no complications.  The patient was last seen in 10/2017 for follow-up by Robbie Lis, PA and was doing well.  [name] is here today for complaints of not feeling well. For the last 4-5 weeks he has noted a skip in his heart beat associated with brief lightheadedness. Sometimes he feels like he may pass out but he would shake his head and it clears. Only lasts a second but made him feel funny. This has seemed to have resolved in the last 2 days, none yesterday or today. He also has rare fluttering. He has had these feelings in the past but only rarely. He has had SVT in the past and seen by Dr. Lovena Le. A holter monitor showed NSVT and atrial tachycardia but no sustained arrhythmias. An ablation was attempted in 2016 but VT could not be induced. He is on beta blocker and he has cut out caffeine.   He is active working in his yard, mowing,  weed eating, chain saw, using tractor, etc. No exertional chest discomfort or dyspnea and does not notice the skipped heart heart beats when up and active. Mostly feels when sitting and quiet. He has had no syncope. He denies chest pain but has had some "gas", feeling like he needs to burp. This lasts for hours and does not occur with activity.  He has been started on a PPI and ranitidine by his PCP with improvement. He denies shortness of breath during the gas feelings.  He has continued to avoid caffeine and the only addition of medication in the last month or 2 was lansoprazole and ranitidine. He denies herbal supplements.    Past Medical History:  Diagnosis Date  . Adenomatous polyp of colon 05/2004  . Allergic rhinitis   . Anxiety   . BPH (benign prostatic hyperplasia)   . CAD (coronary artery disease)    s/p Cypher DES to LAD and pD1 2010;  LHC was done 6/12: EF 65%, circumflex patent, mid RCA 80-90% (small and nondominant), LAD and diagonal stents patent, LAD at the origin of the first diagonal 30%, ostial D2 90%, mid 80-90% (small vessel).  Continued medical therapy was recommended.   . Carotid stenosis    dopplers 02/2012: 6-81% RICA; 15-72% LICA  . Diabetes type 2, controlled ([place])   . Diverticulosis    colon  . ED (erectile dysfunction)   . Esophageal stricture   .  GERD (gastroesophageal reflux disease)   . Hemorrhoids   . Hiatal hernia   . Hyperlipidemia   . Hypertension   . IBS (irritable bowel syndrome)   . Right inguinal hernia    s/p repair in 8/12  . SVT (supraventricular tachycardia) (HCC)     Past Surgical History:  Procedure Laterality Date  . CARDIAC CATHETERIZATION N/A 12/10/2016   Procedure: Left Heart Cath and Coronary Angiography;  Surgeon: Jettie Booze, MD;  Location: [facility];  Service: Cardiovascular;  Laterality: N/A;  . CARDIAC CATHETERIZATION N/A 12/10/2016   Procedure: Coronary Balloon Angioplasty;  Surgeon: Jettie Booze, MD;   Location: [facility];  Service: Cardiovascular;  Laterality: N/A;  . CARDIAC CATHETERIZATION N/A 12/10/2016   Procedure: Intravascular Pressure Wire/FFR Study;  Surgeon: Jettie Booze, MD;  Location: [facility];  Service: Cardiovascular;  Laterality: N/A;  . CORONARY STENT PLACEMENT  10/2008   x 2  . ELECTROPHYSIOLOGIC STUDY N/A 03/20/2015   no inducible SVT - Dr Lovena Le  . INGUINAL HERNIA REPAIR Right    RIH with ultrapro patch    Current Medications: Current Meds  Medication Sig  . ACCU-CHEK AVIVA PLUS test strip USE FOR TESTING UP TO 4 TIMES DAILY AS DIRECTED  . amLODipine (NORVASC) 5 MG tablet Take 1 tablet (5 mg total) by mouth daily.  Marland Kitchen aspirin EC 81 MG tablet Take 81 mg by mouth daily after supper.  Marland Kitchen atorvastatin (LIPITOR) 10 MG tablet Take 1 tablet (10 mg total) by mouth daily at 6 PM.  . blood glucose meter kit and supplies Dispense based on patient and insurance preference. Use up to four times daily as directed. (FOR ICD-10 E10.9, E11.9).  Marland Kitchen Blood Glucose Monitoring Suppl (ONE TOUCH ULTRA 2) w/Device KIT Use as directed E 11.9  . carvedilol (COREG) 6.25 MG tablet TAKE 1 TABLET (6.25 MG TOTAL) BY MOUTH 2 (TWO) TIMES DAILY WITH A MEAL.  . fexofenadine (HM FEXOFENADINE HCL) 60 MG tablet Take 1 tablet (60 mg total) by mouth 2 (two) times daily as needed for allergies or rhinitis.  Marland Kitchen glipiZIDE (GLUCOTROL XL) 5 MG 24 hr tablet Take 1 tablet (5 mg total) by mouth daily with breakfast.  . hydrOXYzine (ATARAX/VISTARIL) 10 MG tablet Take 1 tablet (10 mg total) by mouth 3 (three) times daily as needed.  . Lancets MISC Use as directed twice daily E11.9  . lansoprazole (PREVACID) 30 MG capsule TAKE 1 CAPSULE (30 MG TOTAL) BY MOUTH DAILY AT 12 NOON.  Marland Kitchen latanoprost (XALATAN) 0.005 % ophthalmic solution Place 1 drop into both eyes at bedtime.  Marland Kitchen linagliptin (TRADJENTA) 5 MG TABS tablet Take 1 tablet (5 mg total) by mouth daily.  Marland Kitchen losartan (COZAAR) 100 MG tablet Take 100 mg  by mouth daily.  . metFORMIN (GLUCOPHAGE-XR) 500 MG 24 hr tablet Take 1 tablet (500 mg total) by mouth daily with breakfast. (Patient taking differently: Take 500 mg by mouth 2 (two) times daily. )  . montelukast (SINGULAIR) 10 MG tablet Take 1 tablet (10 mg total) by mouth daily.  . nitroGLYCERIN (NITROSTAT) 0.4 MG SL tablet Place 1 tablet (0.4 mg total) under the tongue every 5 (five) minutes as needed for chest pain.  Marland Kitchen ondansetron (ZOFRAN) 4 MG tablet Take 1 tablet (4 mg total) by mouth every 8 (eight) hours as needed for nausea or vomiting.  . polyethylene glycol (MIRALAX / GLYCOLAX) packet Take 17 g by mouth daily.  . ranitidine (ZANTAC) 150 MG tablet  Take 0.5 tablets (75 mg total) by mouth 2 (two) times daily.  . [DISCONTINUED] predniSONE (DELTASONE) 10 MG tablet 2 tabs by mouth per day for 7 days     Allergies:   Crestor [rosuvastatin calcium] and Lovastatin   Social History   Socioeconomic History  . Marital status: Married    Spouse name: Not on file  . Number of children: 3  . Years of education: Not on file  . Highest education level: Not on file  Occupational History  . Occupation: Recruitment consultant for [employer]: [employer]  . Financial resource strain: Not on file  . Food insecurity:    Worry: Not on file    Inability: Not on file  . Transportation needs:    Medical: Not on file    Non-medical: Not on file  Tobacco Use  . Smoking status: Former Smoker    Types: Cigarettes    Last attempt to quit: 03/05/1956    Years since quitting: 62.1  . Smokeless tobacco: Never Used  . Tobacco comment: smoked in teens  Substance and Sexual Activity  . Alcohol use: No    Alcohol/week: 0.0 oz  . Drug use: No  . Sexual activity: Not on file  Lifestyle  . Physical activity:    Days per week: Not on file    Minutes per session: Not on file  . Stress: Not on file  Relationships  . Social connections:    Talks on phone: Not on file    Gets together: Not  on file    Attends religious service: Not on file    Active member of club or organization: Not on file    Attends meetings of clubs or organizations: Not on file    Relationship status: Not on file  Other Topics Concern  . Not on file  Social History Narrative   Lives with wife in a 3 story home.  Has 2 living children.  1 passed away in a car accident.     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       Family History: The patient's family history includes Bladder Cancer (age of onset: 68) in his mother; Healthy in his daughter and son; Heart attack (age of onset: 19) in his father; Pancreatic cancer (age of onset: 17) in his sister. ROS:   Please see the history of present illness.     All other systems reviewed and are negative.  EKGs/Labs/Other Studies Reviewed:    The following studies were reviewed today:  Cardiac Cath 12/10/2016:  Dominance: Left  Left Anterior Descending  Prox LAD to Mid LAD lesion, 0% stenosed. Previously placed Prox LAD to Mid LAD drug eluting stent is widely patent.  First Diagonal Branch  Vessel is small in size.  Ost 1st Diag to 1st Diag lesion, 0% stenosed. Previously placed Ost 1st Diag to 1st Diag drug eluting stent is widely patent.  1st Diag lesion, 80% stenosed.  Angioplasty: Lesion crossed with guidewire using a WIRE ASAHI PROWATER 180CM. Angioplasty alone was performed using a BALLOON MOZEC 2.0X12. Maximum pressure: 10 atm. The pre-interventional distal flow is normal (TIMI 3). The post-interventional distal flow is normal (TIMI 3). The intervention was successful . No complications occurred at this lesion. Unable to deliver 2.25 Promus stent through the prior stented area.  There is a 10% residual stenosis post intervention.  Second Engineer, production  Vessel is small in size.  Ost 2nd Diag to 2nd Diag lesion, 75% stenosed.  2nd Diag lesion, 75% stenosed.  Left Circumflex  The vessel  exhibits minimal luminal irregularities.  Right Coronary Artery  Vessel is small. Dist RCA filled by collaterals from Acute Mrg.  Mid RCA lesion, 100% stenosed. The lesion is chronically occluded with left-to-right and right-to-right collateral flow.  Right Posterior Descending Artery  RPDA filled by collaterals from Dist LAD.  Left Heart   Left Ventricle LV end diastolic pressure is normal.    Aortic Valve There is no aortic valve stenosis.      Diagnostic Diagram       Post-Intervention Diagram          EKG:  EKG is not ordered today.    Recent Labs: 08/19/2017: Hemoglobin 14.3; Platelets 215.0; TSH 2.03 12/01/2017: ALT 23; BUN 17; Creatinine, Ser 0.93; Potassium 4.3; Sodium 145   Recent Lipid Panel    Component Value Date/Time   CHOL 130 08/19/2017 1439   TRIG (H) 08/19/2017 1439    244.0 Triglyceride is over 400; calculations on Lipids are invalid.   TRIG 128 11/19/2006 1032   HDL 28.40 (L) 08/19/2017 1439   CHOLHDL 5 08/19/2017 1439   VLDL 48.8 (H) 08/19/2017 1439   LDLCALC 63 12/21/2015 1044   LDLDIRECT 66.0 08/19/2017 1439    Physical Exam:    VS:  BP 130/68   Pulse (!) 57   Ht '5\' 11"'  (1.803 m)   Wt 159 lb (72.1 kg)   BMI 22.18 kg/m     Wt Readings from Last 3 Encounters:  03/27/18 159 lb (72.1 kg)  02/13/18 159 lb (72.1 kg)  12/12/17 164 lb (74.4 kg)     Physical Exam  Constitutional: He is oriented to person, place, and time. He appears well-developed and well-nourished. No distress.  HENT:  Head: Normocephalic and atraumatic.  Neck: Normal range of motion. Neck supple. No JVD present.  Cardiovascular: Normal rate, regular rhythm, normal heart sounds and intact distal pulses. Exam reveals no gallop and no friction rub.  No murmur heard. Pulmonary/Chest: Effort normal and breath sounds normal. No respiratory distress. He has no wheezes. He has no rales.  Abdominal: Soft. Bowel sounds are normal. He exhibits no distension. There is no  tenderness.  Musculoskeletal: Normal range of motion. He exhibits no edema or deformity.  Neurological: He is alert and oriented to person, place, and time.  Skin: Skin is warm and dry.  Psychiatric: He has a normal mood and affect. His behavior is normal. Thought content normal.  Vitals reviewed.  ASSESSMENT:    1. Heart palpitations   2. SVT (supraventricular tachycardia) (HCC)   3. Atherosclerosis of native coronary artery of native heart without angina pectoris   4. Essential hypertension   5. Hyperlipidemia, unspecified hyperlipidemia type    PLAN:    In order of problems listed above:   Palpitations: Hx of NSVT. No inducible VT to ablate by EP study in 2016 by Dr Lovena Le. Has been well controlled with BB and avoiding caffeine until last 4-5 weeks (none in last 2 days). Seems he is having more symptoms lately, palpitations with brief lightheadedness, no syncope. No identified cause. Pantoprazole was recently added and can lead to low magnesium that could exacerbate palpitations. Unable to increase BB due to baseline bradycardia. Will refer back to EP. Will check potassium and magnesium.  CAD: Last cath in 11/2016 with patent prevoius stent, balloon angioplasty to first daigonal, moderate  non-obstructive disease of the LAD. He is conitnued on aspirin, BB, statin, ARB. No exertional chest discomfort or dyspnea with considerable activity. Has been have "gas" feeling in his chest improved with PPI and H2 blocker.   Hypertension: BP well controlled. He reports home BP can be as low as 100/70, but has no orthostatic symptoms. Continue current treatment.   Hyperlipidemia: On atorvastatin 10 mg. Last LDL was 66 in 08/2017 which is at goal of <70. Elevated triglycerides 244 likely related at the time to uncontrolled diabetes, which is now improved.   Diabetes type 2: A1c was elevated in 08/2017 at 9.6, improved in 01/2018 at 6.3.    Medication Adjustments/Labs and Tests Ordered: Current  medicines are reviewed at length with the patient today.  Concerns regarding medicines are outlined above. Labs and tests ordered and medication changes are outlined in the patient instructions below:  Patient Instructions  Medication Instructions:   Your physician recommends that you continue on your current medications as directed. Please refer to the Current Medication list given to you today.   If you need a refill on your cardiac medications before your next appointment, please call your pharmacy.  Labwork:  BMET  MAG TODAY    Testing/Procedures:  NONE ORDERED  TODAY    Follow-Up:  WITH DR Lovena Le OR SEILER FIRST AVIALABLE   Any Other Special Instructions Will Be Listed Below (If Applicable).                                                                                                                                                      Signed, Daune Perch, NP  03/27/2018 9:04 AM    [place]

## 2018-03-27 NOTE — Patient Instructions (Addendum)
Medication Instructions:   Your physician recommends that you continue on your current medications as directed. Please refer to the Current Medication list given to you today.   If you need a refill on your cardiac medications before your next appointment, please call your pharmacy.  Labwork:  BMET  MAG TODAY    Testing/Procedures:  NONE ORDERED  TODAY    Follow-Up:  WITH DR Lovena Le OR SEILER FIRST AVIALABLE   Any Other Special Instructions Will Be Listed Below (If Applicable).

## 2018-03-28 ENCOUNTER — Other Ambulatory Visit: Payer: Self-pay | Admitting: Internal Medicine

## 2018-04-06 ENCOUNTER — Other Ambulatory Visit: Payer: Self-pay | Admitting: Cardiovascular Disease

## 2018-04-16 ENCOUNTER — Ambulatory Visit (INDEPENDENT_AMBULATORY_CARE_PROVIDER_SITE_OTHER): Payer: Medicare HMO | Admitting: Internal Medicine

## 2018-04-16 ENCOUNTER — Other Ambulatory Visit (INDEPENDENT_AMBULATORY_CARE_PROVIDER_SITE_OTHER): Payer: Medicare HMO

## 2018-04-16 ENCOUNTER — Encounter: Payer: Self-pay | Admitting: Internal Medicine

## 2018-04-16 VITALS — BP 130/78 | HR 65 | Temp 98.1°F | Ht 71.0 in | Wt 159.0 lb

## 2018-04-16 DIAGNOSIS — R350 Frequency of micturition: Secondary | ICD-10-CM

## 2018-04-16 DIAGNOSIS — I10 Essential (primary) hypertension: Secondary | ICD-10-CM | POA: Diagnosis not present

## 2018-04-16 DIAGNOSIS — E1159 Type 2 diabetes mellitus with other circulatory complications: Secondary | ICD-10-CM

## 2018-04-16 DIAGNOSIS — R42 Dizziness and giddiness: Secondary | ICD-10-CM

## 2018-04-16 LAB — URINALYSIS, ROUTINE W REFLEX MICROSCOPIC
BILIRUBIN URINE: NEGATIVE
HGB URINE DIPSTICK: NEGATIVE
Ketones, ur: NEGATIVE
LEUKOCYTES UA: NEGATIVE
NITRITE: NEGATIVE
RBC / HPF: NONE SEEN (ref 0–?)
Specific Gravity, Urine: <=1.005 — AB (ref 1.000–1.030)
TOTAL PROTEIN, URINE-UPE24: NEGATIVE
URINE GLUCOSE: NEGATIVE
UROBILINOGEN UA: 0.2 (ref 0.0–1.0)
pH: 5.5 (ref 5.0–8.0)

## 2018-04-16 MED ORDER — OXYBUTYNIN CHLORIDE ER 5 MG PO TB24: 5.0000 mg | ORAL_TABLET | Freq: Every day | ORAL | 3 refills | Status: DC

## 2018-04-16 MED ORDER — MECLIZINE HCL 12.5 MG PO TABS: 12.5000 mg | ORAL_TABLET | Freq: Three times a day (TID) | ORAL | 2 refills | Status: DC | PRN

## 2018-04-16 NOTE — Progress Notes (Signed)
Subjective:    Patient ID: Keith Herrera, male    DOB: [birth year], 84 y.o.   MRN: [id number]  HPI   Here to f/u with c/o 4-5 days onset positional dizziness and room spinning assoc with intermittent nausea, no vomiting, and no HA, fever, ear pain, sinus congestion, ST, or cough.  Pt denies chest pain, increased sob or doe, wheezing, orthopnea, PND, increased LE swelling, palpitations, dizziness or syncope.   Pt denies polydipsia, polyuria, or low sugar symptoms such as weakness or confusion improved with po intake.  Pt states overall good compliance with meds, trying to follow lower cholesterol, diabetic diet, wt overall stable.  Has some loose stools for 1 mo, has been trying to push fluids and drinks plenty.  Has been seeing optho [facility] with right eye surgury but unable to get back good vision.  Due for BP med refill.  No other new complaints or interval hx except has mild worsening urinary freq, but denies urinary symptoms such as dysuria, urgency, flank pain, hematuria or n/v, fever, chills. Past Medical History:  Diagnosis Date  . Adenomatous polyp of colon 05/2004  . Allergic rhinitis   . Anxiety   . BPH (benign prostatic hyperplasia)   . CAD (coronary artery disease)    s/p Cypher DES to LAD and pD1 2010;  LHC was done 6/12: EF 65%, circumflex patent, mid RCA 80-90% (small and nondominant), LAD and diagonal stents patent, LAD at the origin of the first diagonal 30%, ostial D2 90%, mid 80-90% (small vessel).  Continued medical therapy was recommended.   . Carotid stenosis    dopplers 02/2012: 3-71% RICA; 06-26% LICA  . Diabetes type 2, controlled ([place])   . Diverticulosis    colon  . ED (erectile dysfunction)   . Esophageal stricture   . GERD (gastroesophageal reflux disease)   . Hemorrhoids   . Hiatal hernia   . Hyperlipidemia   . Hypertension   . IBS (irritable bowel syndrome)   . Right inguinal hernia    s/p repair in 8/12  . SVT (supraventricular tachycardia) (HCC)     Past Surgical History:  Procedure Laterality Date  . CARDIAC CATHETERIZATION N/A 12/10/2016   Procedure: Left Heart Cath and Coronary Angiography;  Surgeon: Jettie Booze, MD;  Location: [place] [facility];  Service: Cardiovascular;  Laterality: N/A;  . CARDIAC CATHETERIZATION N/A 12/10/2016   Procedure: Coronary Balloon Angioplasty;  Surgeon: Jettie Booze, MD;  Location: [place] [facility];  Service: Cardiovascular;  Laterality: N/A;  . CARDIAC CATHETERIZATION N/A 12/10/2016   Procedure: Intravascular Pressure Wire/FFR Study;  Surgeon: Jettie Booze, MD;  Location: [place] [facility];  Service: Cardiovascular;  Laterality: N/A;  . CORONARY STENT PLACEMENT  10/2008   x 2  . ELECTROPHYSIOLOGIC STUDY N/A 03/20/2015   no inducible SVT - Dr Lovena Le  . INGUINAL HERNIA REPAIR Right    RIH with ultrapro patch    reports that he quit smoking about 62 years ago. His smoking use included cigarettes. He has never used smokeless tobacco. He reports that he does not drink alcohol or use drugs. family history includes Bladder Cancer (age of onset: 68) in his mother; Healthy in his daughter and son; Heart attack (age of onset: 67) in his father; Pancreatic cancer (age of onset: 41) in his sister. Allergies  Allergen Reactions  . Crestor [Rosuvastatin Calcium] Rash    All over body  . Lovastatin Itching and Rash   Current Outpatient Medications on  File Prior to Visit  Medication Sig Dispense Refill  . ACCU-CHEK AVIVA PLUS test strip USE FOR TESTING UP TO 4 TIMES DAILY AS DIRECTED 100 each 0  . amLODipine (NORVASC) 5 MG tablet TAKE 1 TABLET BY MOUTH EVERY DAY 90 tablet 3  . aspirin EC 81 MG tablet Take 81 mg by mouth daily after supper.    Marland Kitchen atorvastatin (LIPITOR) 10 MG tablet Take 1 tablet (10 mg total) by mouth daily at 6 PM. 30 tablet 5  . blood glucose meter kit and supplies Dispense based on patient and insurance preference. Use up to four times daily as directed. (FOR ICD-10 E10.9,  E11.9). 1 each 0  . Blood Glucose Monitoring Suppl (ONE TOUCH ULTRA 2) w/Device KIT Use as directed E 11.9 1 each 0  . carvedilol (COREG) 6.25 MG tablet TAKE 1 TABLET (6.25 MG TOTAL) BY MOUTH 2 (TWO) TIMES DAILY WITH A MEAL. 180 tablet 3  . fexofenadine (HM FEXOFENADINE HCL) 60 MG tablet Take 1 tablet (60 mg total) by mouth 2 (two) times daily as needed for allergies or rhinitis. 60 tablet 2  . glipiZIDE (GLUCOTROL XL) 5 MG 24 hr tablet Take 1 tablet (5 mg total) by mouth daily with breakfast. 90 tablet 3  . hydrOXYzine (ATARAX/VISTARIL) 10 MG tablet Take 1 tablet (10 mg total) by mouth 3 (three) times daily as needed. 60 tablet 3  . Lancets MISC Use as directed twice daily E11.9 200 each 3  . lansoprazole (PREVACID) 30 MG capsule TAKE 1 CAPSULE (30 MG TOTAL) BY MOUTH DAILY AT 12 NOON. 90 capsule 3  . latanoprost (XALATAN) 0.005 % ophthalmic solution Place 1 drop into both eyes at bedtime.  12  . linagliptin (TRADJENTA) 5 MG TABS tablet Take 1 tablet (5 mg total) by mouth daily. 90 tablet 3  . losartan (COZAAR) 100 MG tablet TAKE 1 TABLET EVERY DAY 90 tablet 0  . metFORMIN (GLUCOPHAGE-XR) 500 MG 24 hr tablet Take 1 tablet (500 mg total) by mouth daily with breakfast. (Patient taking differently: Take 500 mg by mouth 2 (two) times daily. ) 90 tablet 3  . montelukast (SINGULAIR) 10 MG tablet Take 1 tablet (10 mg total) by mouth daily. 30 tablet 5  . nitroGLYCERIN (NITROSTAT) 0.4 MG SL tablet Place 1 tablet (0.4 mg total) under the tongue every 5 (five) minutes as needed for chest pain. 40 tablet 1  . ondansetron (ZOFRAN) 4 MG tablet Take 1 tablet (4 mg total) by mouth every 8 (eight) hours as needed for nausea or vomiting. 30 tablet 0  . polyethylene glycol (MIRALAX / GLYCOLAX) packet Take 17 g by mouth daily. 72 each 3  . ranitidine (ZANTAC) 150 MG tablet Take 0.5 tablets (75 mg total) by mouth 2 (two) times daily. 15 tablet 3   No current facility-administered medications on file prior to visit.      Review of Systems  Constitutional: Negative for other unusual diaphoresis or sweats HENT: Negative for ear discharge or swelling Eyes: Negative for other worsening visual disturbances Respiratory: Negative for stridor or other swelling  Gastrointestinal: Negative for worsening distension or other blood Genitourinary: Negative for retention or other urinary change Musculoskeletal: Negative for other MSK pain or swelling Skin: Negative for color change or other new lesions Neurological: Negative for worsening tremors and other numbness  Psychiatric/Behavioral: Negative for worsening agitation or other fatigue All other system neg per pt    Objective:   Physical Exam BP 130/78   Pulse 65  Temp 98.1 F (36.7 C) (Oral)   Ht 5' 11" (1.803 m)   Wt 159 lb (72.1 kg)   SpO2 97%   BMI 22.18 kg/m  VS noted,  Constitutional: Pt appears in NAD HENT: Head: NCAT.  Right Ear: External ear normal.  Left Ear: External ear normal.  Eyes: . Pupils are equal, round, and reactive to light. Conjunctivae and EOM are normal Nose: without d/c or deformity Neck: Neck supple. Gross normal ROM Cardiovascular: Normal rate and regular rhythm.   Pulmonary/Chest: Effort normal and breath sounds without rales or wheezing.  Abd:  Soft, NT, ND, + BS, no organomegaly Neurological: Pt is alert. At baseline orientation, motor grossly intact, cbl intact, cn 2-12 intact Skin: Skin is warm. No rashes, other new lesions, no LE edema Psychiatric: Pt behavior is normal without agitation  No other exam findings  Lab Results  Component Value Date   WBC CANCELED 12/01/2017   HGB 14.3 08/19/2017   HCT 42.2 08/19/2017   PLT 215.0 08/19/2017   GLUCOSE 220 (H) 03/27/2018   CHOL 130 08/19/2017   TRIG (H) 08/19/2017    244.0 Triglyceride is over 400; calculations on Lipids are invalid.   HDL 28.40 (L) 08/19/2017   LDLDIRECT 66.0 08/19/2017   LDLCALC 63 12/21/2015   ALT 23 12/01/2017   AST 21 12/01/2017   NA 140  03/27/2018   K 4.1 03/27/2018   CL 103 03/27/2018   CREATININE 1.02 03/27/2018   BUN 19 03/27/2018   CO2 24 03/27/2018   TSH 2.03 08/19/2017   PSA 2.49 12/21/2012   INR 1.15 12/10/2016   HGBA1C 6.3 02/13/2018   MICROALBUR 3.4 (H) 08/19/2017       Assessment & Plan:

## 2018-04-16 NOTE — Patient Instructions (Signed)
Please take all new medication as prescribed - the antivert (meclizine) for dizziness, as well as the oxybutinin ER 5 mg per day for the bladder  Please continue all other medications as before, and refills have been done if requested.  Please have the pharmacy call with any other refills you may need.  Please keep your appointments with your specialists as you may have planned  You will be contacted regarding the referral for: Physical Therapy (vestibular rehab)  Please go to the LAB in the Basement (turn left off the elevator) for the tests to be done today - just the urine testing today  You will be contacted by phone if any changes need to be made immediately.  Otherwise, you will receive a letter about your results with an explanation, but please check with MyChart first.  Please remember to sign up for MyChart if you have not done so, as this will be important to you in the future with finding out test results, communicating by private email, and scheduling acute appointments online when needed.

## 2018-04-17 LAB — URINE CULTURE
MICRO NUMBER:: 90652459
Result:: NO GROWTH
SPECIMEN QUALITY:: ADEQUATE

## 2018-04-18 NOTE — Assessment & Plan Note (Signed)
stable overall by history and exam, recent data reviewed with pt, and pt to continue medical treatment as before,  to f/u any worsening symptoms or concerns Lab Results  Component Value Date   HGBA1C 6.3 02/13/2018

## 2018-04-18 NOTE — Assessment & Plan Note (Signed)
C/w probable OAB vs prostatism, for trial oxybutnin after urine studies today

## 2018-04-18 NOTE — Assessment & Plan Note (Signed)
C/w BPPV, for antivert and PT vestibular rehab

## 2018-04-18 NOTE — Assessment & Plan Note (Signed)
stable overall by history and exam, recent data reviewed with pt, and pt to continue medical treatment as before,  to f/u any worsening symptoms or concerns ' BP Readings from Last 3 Encounters:  04/16/18 130/78  03/27/18 130/68  02/13/18 128/82

## 2018-04-21 ENCOUNTER — Encounter: Payer: Self-pay | Admitting: Internal Medicine

## 2018-04-21 ENCOUNTER — Ambulatory Visit: Payer: Medicare HMO | Admitting: Internal Medicine

## 2018-04-21 VITALS — BP 126/62 | HR 58 | Ht 71.0 in | Wt 162.0 lb

## 2018-04-21 DIAGNOSIS — I471 Supraventricular tachycardia: Secondary | ICD-10-CM

## 2018-04-21 DIAGNOSIS — I2510 Atherosclerotic heart disease of native coronary artery without angina pectoris: Secondary | ICD-10-CM

## 2018-04-21 DIAGNOSIS — R002 Palpitations: Secondary | ICD-10-CM | POA: Diagnosis not present

## 2018-04-21 DIAGNOSIS — I10 Essential (primary) hypertension: Secondary | ICD-10-CM | POA: Diagnosis not present

## 2018-04-21 NOTE — Progress Notes (Signed)
HPI Keith Herrera returns today for evaluation of palpitations. He was worse about a month or two ago. The patient has not had syncope. He notes palpitations which come and go. He wore a cardiac monitor for the same problem and was found to have daytime transient bradycardia in the 40's. He has PVC's and PAC's on cardiac monitoring. Also NS atrial tachy. He has CAD but has not had angina. Allergies  Allergen Reactions  . Crestor [Rosuvastatin Calcium] Rash    All over body  . Lovastatin Itching and Rash     Current Outpatient Medications  Medication Sig Dispense Refill  . ACCU-CHEK AVIVA PLUS test strip USE FOR TESTING UP TO 4 TIMES DAILY AS DIRECTED 100 each 0  . amLODipine (NORVASC) 5 MG tablet TAKE 1 TABLET BY MOUTH EVERY DAY 90 tablet 3  . aspirin EC 81 MG tablet Take 81 mg by mouth daily after supper.    Marland Kitchen atorvastatin (LIPITOR) 10 MG tablet Take 1 tablet (10 mg total) by mouth daily at 6 PM. 30 tablet 5  . blood glucose meter kit and supplies Dispense based on patient and insurance preference. Use up to four times daily as directed. (FOR ICD-10 E10.9, E11.9). 1 each 0  . Blood Glucose Monitoring Suppl (ONE TOUCH ULTRA 2) w/Device KIT Use as directed E 11.9 1 each 0  . carvedilol (COREG) 6.25 MG tablet TAKE 1 TABLET (6.25 MG TOTAL) BY MOUTH 2 (TWO) TIMES DAILY WITH A MEAL. 180 tablet 3  . fexofenadine (HM FEXOFENADINE HCL) 60 MG tablet Take 1 tablet (60 mg total) by mouth 2 (two) times daily as needed for allergies or rhinitis. 60 tablet 2  . glipiZIDE (GLUCOTROL XL) 5 MG 24 hr tablet Take 1 tablet (5 mg total) by mouth daily with breakfast. 90 tablet 3  . hydrOXYzine (ATARAX/VISTARIL) 10 MG tablet Take 1 tablet (10 mg total) by mouth 3 (three) times daily as needed. 60 tablet 3  . Lancets MISC Use as directed twice daily E11.9 200 each 3  . lansoprazole (PREVACID) 30 MG capsule TAKE 1 CAPSULE (30 MG TOTAL) BY MOUTH DAILY AT 12 NOON. 90 capsule 3  . latanoprost (XALATAN) 0.005 %  ophthalmic solution Place 1 drop into both eyes at bedtime.  12  . linagliptin (TRADJENTA) 5 MG TABS tablet Take 1 tablet (5 mg total) by mouth daily. 90 tablet 3  . losartan (COZAAR) 100 MG tablet TAKE 1 TABLET EVERY DAY 90 tablet 0  . metFORMIN (GLUCOPHAGE-XR) 500 MG 24 hr tablet Take 1 tablet (500 mg total) by mouth daily with breakfast. (Patient taking differently: Take 500 mg by mouth 2 (two) times daily. ) 90 tablet 3  . montelukast (SINGULAIR) 10 MG tablet Take 1 tablet (10 mg total) by mouth daily. 30 tablet 5  . nitroGLYCERIN (NITROSTAT) 0.4 MG SL tablet Place 1 tablet (0.4 mg total) under the tongue every 5 (five) minutes as needed for chest pain. 40 tablet 1  . oxybutynin (DITROPAN-XL) 5 MG 24 hr tablet Take 1 tablet (5 mg total) by mouth at bedtime. 90 tablet 3  . ranitidine (ZANTAC) 150 MG tablet Take 0.5 tablets (75 mg total) by mouth 2 (two) times daily. 15 tablet 3   No current facility-administered medications for this visit.      Past Medical History:  Diagnosis Date  . Adenomatous polyp of colon 05/2004  . Allergic rhinitis   . Anxiety   . BPH (benign prostatic hyperplasia)   .  CAD (coronary artery disease)    s/p Cypher DES to LAD and pD1 2010;  LHC was done 6/12: EF 65%, circumflex patent, mid RCA 80-90% (small and nondominant), LAD and diagonal stents patent, LAD at the origin of the first diagonal 30%, ostial D2 90%, mid 80-90% (small vessel).  Continued medical therapy was recommended.   . Carotid stenosis    dopplers 02/2012: 9-37% RICA; 34-28% LICA  . Diabetes type 2, controlled ([place])   . Diverticulosis    colon  . ED (erectile dysfunction)   . Esophageal stricture   . GERD (gastroesophageal reflux disease)   . Hemorrhoids   . Hiatal hernia   . Hyperlipidemia   . Hypertension   . IBS (irritable bowel syndrome)   . Right inguinal hernia    s/p repair in 8/12  . SVT (supraventricular tachycardia) (HCC)     ROS:   All systems reviewed and negative except  as noted in the HPI.   Past Surgical History:  Procedure Laterality Date  . CARDIAC CATHETERIZATION N/A 12/10/2016   Procedure: Left Heart Cath and Coronary Angiography;  Surgeon: Jettie Booze, MD;  Location: [facility];  Service: Cardiovascular;  Laterality: N/A;  . CARDIAC CATHETERIZATION N/A 12/10/2016   Procedure: Coronary Balloon Angioplasty;  Surgeon: Jettie Booze, MD;  Location: [place] [facility];  Service: Cardiovascular;  Laterality: N/A;  . CARDIAC CATHETERIZATION N/A 12/10/2016   Procedure: Intravascular Pressure Wire/FFR Study;  Surgeon: Jettie Booze, MD;  Location: [place] [facility];  Service: Cardiovascular;  Laterality: N/A;  . CORONARY STENT PLACEMENT  10/2008   x 2  . ELECTROPHYSIOLOGIC STUDY N/A 03/20/2015   no inducible SVT - Dr Lovena Le  . INGUINAL HERNIA REPAIR Right    RIH with ultrapro patch     Family History  Problem Relation Age of Onset  . Bladder Cancer Mother 51  . Heart attack Father 79       Deceased  . Pancreatic cancer Sister 84  . Healthy Son   . Healthy Daughter      Social History   Socioeconomic History  . Marital status: Married    Spouse name: Not on file  . Number of children: 3  . Years of education: Not on file  . Highest education level: Not on file  Occupational History  . Occupation: Recruitment consultant for [employer]: [place]  . Financial resource strain: Not on file  . Food insecurity:    Worry: Not on file    Inability: Not on file  . Transportation needs:    Medical: Not on file    Non-medical: Not on file  Tobacco Use  . Smoking status: Former Smoker    Types: Cigarettes    Last attempt to quit: 03/05/1956    Years since quitting: 62.1  . Smokeless tobacco: Never Used  . Tobacco comment: smoked in teens  Substance and Sexual Activity  . Alcohol use: No    Alcohol/week: 0.0 oz  . Drug use: No  . Sexual activity: Not on file  Lifestyle  . Physical activity:     Days per week: Not on file    Minutes per session: Not on file  . Stress: Not on file  Relationships  . Social connections:    Talks on phone: Not on file    Gets together: Not on file    Attends religious service: Not on file    Active member of  club or organization: Not on file    Attends meetings of clubs or organizations: Not on file    Relationship status: Not on file  . Intimate partner violence:    Fear of current or ex partner: Not on file    Emotionally abused: Not on file    Physically abused: Not on file    Forced sexual activity: Not on file  Other Topics Concern  . Not on file  Social History Narrative   Lives with wife in a 3 story home.  Has 2 living children.  1 passed away in a car accident.     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       BP 126/62   Pulse (!) 58   Ht '5\' 11"'  (1.803 m)   Wt 162 lb (73.5 kg)   BMI 22.59 kg/m   Physical Exam:  Well appearing 84 yo man,  NAD HEENT: Unremarkable Neck:  6 cm JVD, no thyromegally Lymphatics:  No adenopathy Back:  No CVA tenderness Lungs:  Clear with no wheezes HEART:  Regular rate rhythm, no murmurs, no rubs, no clicks Abd:  soft, positive bowel sounds, no organomegally, no rebound, no guarding Ext:  2 plus pulses, no edema, no cyanosis, no clubbing Skin:  No rashes no nodules Neuro:  CN II through XII intact, motor grossly intact  EKG - NSR with right axis  Assess/Plan: 1. Palpitations - I have discussed the nature of his symptoms. His CAD makes treatment of his palpitations really amiodarone. Unfortunately his baseline tendency for bradycardia would almost certainly result in the need for  PPM.  2. CAD - he denies anginal symptoms. Will follow. No change in meds. 3. HTN - his blood pressure is well controlled. We will follow.   Mikle Bosworth.D.

## 2018-04-21 NOTE — Patient Instructions (Addendum)
Medication Instructions:  Your physician recommends that you continue on your current medications as directed. Please refer to the Current Medication list given to you today.  Labwork: None ordered.  Testing/Procedures: None ordered.  Follow-Up: Your physician wants you to follow-up in: as needed with Dr. Taylor.      Any Other Special Instructions Will Be Listed Below (If Applicable).  If you need a refill on your cardiac medications before your next appointment, please call your pharmacy.   

## 2018-04-22 ENCOUNTER — Other Ambulatory Visit: Payer: Self-pay | Admitting: Internal Medicine

## 2018-04-27 ENCOUNTER — Telehealth: Payer: Self-pay | Admitting: Internal Medicine

## 2018-04-27 DIAGNOSIS — N401 Enlarged prostate with lower urinary tract symptoms: Secondary | ICD-10-CM

## 2018-04-27 NOTE — Telephone Encounter (Signed)
Ok, this is done 

## 2018-04-27 NOTE — Telephone Encounter (Signed)
Copied from [place] [phone]. Topic: Referral - Request >> Apr 27, 2018 10:48 AM Carolyn Stare wrote:  Pt call and would like a referral to see a Urologist he said the medicine he was given is not working   [phone] house    cell [phone]

## 2018-05-19 ENCOUNTER — Other Ambulatory Visit (INDEPENDENT_AMBULATORY_CARE_PROVIDER_SITE_OTHER): Payer: Medicare HMO

## 2018-05-19 ENCOUNTER — Ambulatory Visit (INDEPENDENT_AMBULATORY_CARE_PROVIDER_SITE_OTHER): Payer: Medicare HMO | Admitting: Internal Medicine

## 2018-05-19 ENCOUNTER — Encounter: Payer: Self-pay | Admitting: Internal Medicine

## 2018-05-19 VITALS — BP 116/76 | HR 62 | Temp 97.8°F | Ht 71.0 in | Wt 162.0 lb

## 2018-05-19 DIAGNOSIS — E1159 Type 2 diabetes mellitus with other circulatory complications: Secondary | ICD-10-CM

## 2018-05-19 DIAGNOSIS — K5900 Constipation, unspecified: Secondary | ICD-10-CM

## 2018-05-19 DIAGNOSIS — Z0000 Encounter for general adult medical examination without abnormal findings: Secondary | ICD-10-CM

## 2018-05-19 LAB — CBC WITH DIFFERENTIAL/PLATELET
BASOS ABS: 0.1 10*3/uL (ref 0.0–0.1)
Basophils Relative: 0.7 % (ref 0.0–3.0)
EOS ABS: 0.2 10*3/uL (ref 0.0–0.7)
Eosinophils Relative: 2.8 % (ref 0.0–5.0)
HCT: 46.1 % (ref 39.0–52.0)
HEMOGLOBIN: 15.6 g/dL (ref 13.0–17.0)
Lymphocytes Relative: 19.5 % (ref 12.0–46.0)
Lymphs Abs: 1.5 10*3/uL (ref 0.7–4.0)
MCHC: 33.9 g/dL (ref 30.0–36.0)
MCV: 88.4 fl (ref 78.0–100.0)
MONOS PCT: 9 % (ref 3.0–12.0)
Monocytes Absolute: 0.7 10*3/uL (ref 0.1–1.0)
Neutro Abs: 5.2 10*3/uL (ref 1.4–7.7)
Neutrophils Relative %: 68 % (ref 43.0–77.0)
Platelets: 202 10*3/uL (ref 150.0–400.0)
RBC: 5.22 Mil/uL (ref 4.22–5.81)
RDW: 13.9 % (ref 11.5–15.5)
WBC: 7.6 10*3/uL (ref 4.0–10.5)

## 2018-05-19 LAB — URINALYSIS, ROUTINE W REFLEX MICROSCOPIC
Bilirubin Urine: NEGATIVE
Hgb urine dipstick: NEGATIVE
Ketones, ur: NEGATIVE
Leukocytes, UA: NEGATIVE
Nitrite: NEGATIVE
PH: 6 (ref 5.0–8.0)
RBC / HPF: NONE SEEN (ref 0–?)
SPECIFIC GRAVITY, URINE: 1.02 (ref 1.000–1.030)
TOTAL PROTEIN, URINE-UPE24: NEGATIVE
URINE GLUCOSE: 250 — AB
Urobilinogen, UA: 0.2 (ref 0.0–1.0)

## 2018-05-19 LAB — BASIC METABOLIC PANEL
BUN: 17 mg/dL (ref 6–23)
CALCIUM: 9.6 mg/dL (ref 8.4–10.5)
CO2: 30 mEq/L (ref 19–32)
CREATININE: 1.15 mg/dL (ref 0.40–1.50)
Chloride: 104 mEq/L (ref 96–112)
GFR: 64.37 mL/min (ref >60.00)
Glucose, Bld: 202 mg/dL — ABNORMAL HIGH (ref 70–99)
Potassium: 4.7 mEq/L (ref 3.5–5.1)
Sodium: 139 mEq/L (ref 135–145)

## 2018-05-19 LAB — LDL CHOLESTEROL, DIRECT: Direct LDL: 82 mg/dL

## 2018-05-19 LAB — LIPID PANEL
CHOL/HDL RATIO: 4
CHOLESTEROL: 134 mg/dL (ref 0–200)
HDL: 31 mg/dL — ABNORMAL LOW (ref >39.00)
NonHDL: 103.41
Triglycerides: 229 mg/dL — ABNORMAL HIGH (ref 0.0–149.0)
VLDL: 45.8 mg/dL — ABNORMAL HIGH (ref 0.0–40.0)

## 2018-05-19 LAB — TSH: TSH: 2.49 u[IU]/mL (ref 0.35–4.50)

## 2018-05-19 LAB — MICROALBUMIN / CREATININE URINE RATIO
CREATININE, U: 97.9 mg/dL
MICROALB UR: 0.9 mg/dL (ref 0.0–1.9)
Microalb Creat Ratio: 0.9 mg/g (ref 0.0–30.0)

## 2018-05-19 LAB — HEPATIC FUNCTION PANEL
ALK PHOS: 50 U/L (ref 39–117)
ALT: 18 U/L (ref 0–53)
AST: 13 U/L (ref 0–37)
Albumin: 4 g/dL (ref 3.5–5.2)
BILIRUBIN DIRECT: 0.1 mg/dL (ref 0.0–0.3)
Total Bilirubin: 0.8 mg/dL (ref 0.2–1.2)
Total Protein: 6.6 g/dL (ref 6.0–8.3)

## 2018-05-19 LAB — HEMOGLOBIN A1C: Hgb A1c MFr Bld: 7.2 % — ABNORMAL HIGH (ref 4.6–6.5)

## 2018-05-19 NOTE — Progress Notes (Signed)
Subjective:    Patient ID: Keith Herrera, male    DOB: July 05, 1932, 84 y.o.   MRN: [id number]  HPI  Here for wellness and f/u;  Overall doing ok;  Pt denies Chest pain, worsening SOB, DOE, wheezing, orthopnea, PND, worsening LE edema, palpitations, dizziness or syncope.  Pt denies neurological change such as new headache, facial or extremity weakness.  Pt denies polydipsia, polyuria, or low sugar symptoms. Pt states overall good compliance with treatment and medications, good tolerability, and has been trying to follow appropriate diet.  Pt denies worsening depressive symptoms, suicidal ideation or panic. No fever, night sweats, wt loss, loss of appetite, or other constitutional symptoms.  Pt states good ability with ADL's, has low fall risk, home safety reviewed and adequate, no other significant changes in hearing or vision, and only occasionally active with exercise. Only wears reading glasses, has sunglasses for glare, but actually sees better at night to drive.   Last wk with significant episode of "gas,bloating and carrying on, with diarrhea), now with constipation, and wondering how to tx.  Denies worsening reflux, abd pain, dysphagia, n/v, or blood. No fever.  Wt stable. Overall feeling "good the last 2-3 days, felt better than I ever felt" and appetite ok.   Wt Readings from Last 3 Encounters:  05/19/18 162 lb (73.5 kg)  04/21/18 162 lb (73.5 kg)  04/16/18 159 lb (72.1 kg)   BP Readings from Last 3 Encounters:  05/19/18 116/76  04/21/18 126/62  04/16/18 130/78   Past Medical History:  Diagnosis Date  . Adenomatous polyp of colon 05/2004  . Allergic rhinitis   . Anxiety   . BPH (benign prostatic hyperplasia)   . CAD (coronary artery disease)    s/p Cypher DES to LAD and pD1 2010;  LHC was done 6/12: EF 65%, circumflex patent, mid RCA 80-90% (small and nondominant), LAD and diagonal stents patent, LAD at the origin of the first diagonal 30%, ostial D2 90%, mid 80-90% (small vessel).   Continued medical therapy was recommended.   . Carotid stenosis    dopplers 02/2012: 3-66% RICA; 29-47% LICA  . Diabetes type 2, controlled ([place])   . Diverticulosis    colon  . ED (erectile dysfunction)   . Esophageal stricture   . GERD (gastroesophageal reflux disease)   . Hemorrhoids   . Hiatal hernia   . Hyperlipidemia   . Hypertension   . IBS (irritable bowel syndrome)   . Right inguinal hernia    s/p repair in 8/12  . SVT (supraventricular tachycardia) (HCC)    Past Surgical History:  Procedure Laterality Date  . CARDIAC CATHETERIZATION N/A 12/10/2016   Procedure: Left Heart Cath and Coronary Angiography;  Surgeon: Jettie Booze, MD;  Location: [facility];  Service: Cardiovascular;  Laterality: N/A;  . CARDIAC CATHETERIZATION N/A 12/10/2016   Procedure: Coronary Balloon Angioplasty;  Surgeon: Jettie Booze, MD;  Location: [facility];  Service: Cardiovascular;  Laterality: N/A;  . CARDIAC CATHETERIZATION N/A 12/10/2016   Procedure: Intravascular Pressure Wire/FFR Study;  Surgeon: Jettie Booze, MD;  Location: [facility];  Service: Cardiovascular;  Laterality: N/A;  . CORONARY STENT PLACEMENT  10/2008   x 2  . ELECTROPHYSIOLOGIC STUDY N/A 03/20/2015   no inducible SVT - Dr Lovena Le  . INGUINAL HERNIA REPAIR Right    RIH with ultrapro patch    reports that he quit smoking about 62 years ago. His smoking use included cigarettes. He has never  used smokeless tobacco. He reports that he does not drink alcohol or use drugs. family history includes Bladder Cancer (age of onset: 56) in his mother; Healthy in his daughter and son; Heart attack (age of onset: 37) in his father; Pancreatic cancer (age of onset: 31) in his sister. Allergies  Allergen Reactions  . Crestor [Rosuvastatin Calcium] Rash    All over body  . Lovastatin Itching and Rash   Current Outpatient Medications on File Prior to Visit  Medication Sig Dispense Refill  . ACCU-CHEK  AVIVA PLUS test strip USE FOR TESTING UP TO 4 TIMES DAILY AS DIRECTED 100 each 0  . amLODipine (NORVASC) 5 MG tablet TAKE 1 TABLET BY MOUTH EVERY DAY 90 tablet 3  . aspirin EC 81 MG tablet Take 81 mg by mouth daily after supper.    Marland Kitchen atorvastatin (LIPITOR) 10 MG tablet Take 1 tablet (10 mg total) by mouth daily at 6 PM. 30 tablet 5  . blood glucose meter kit and supplies Dispense based on patient and insurance preference. Use up to four times daily as directed. (FOR ICD-10 E10.9, E11.9). 1 each 0  . Blood Glucose Monitoring Suppl (ONE TOUCH ULTRA 2) w/Device KIT Use as directed E 11.9 1 each 0  . carvedilol (COREG) 6.25 MG tablet TAKE 1 TABLET (6.25 MG TOTAL) BY MOUTH 2 (TWO) TIMES DAILY WITH A MEAL. 180 tablet 3  . fexofenadine (HM FEXOFENADINE HCL) 60 MG tablet Take 1 tablet (60 mg total) by mouth 2 (two) times daily as needed for allergies or rhinitis. 60 tablet 2  . glipiZIDE (GLUCOTROL XL) 5 MG 24 hr tablet Take 1 tablet (5 mg total) by mouth daily with breakfast. 90 tablet 3  . hydrOXYzine (ATARAX/VISTARIL) 10 MG tablet TAKE 1 TABLET BY MOUTH THREE TIMES A DAY AS NEEDED 60 tablet 2  . Lancets MISC Use as directed twice daily E11.9 200 each 3  . lansoprazole (PREVACID) 30 MG capsule TAKE 1 CAPSULE (30 MG TOTAL) BY MOUTH DAILY AT 12 NOON. 90 capsule 3  . latanoprost (XALATAN) 0.005 % ophthalmic solution Place 1 drop into both eyes at bedtime.  12  . linagliptin (TRADJENTA) 5 MG TABS tablet Take 1 tablet (5 mg total) by mouth daily. 90 tablet 3  . losartan (COZAAR) 100 MG tablet TAKE 1 TABLET EVERY DAY 90 tablet 0  . metFORMIN (GLUCOPHAGE-XR) 500 MG 24 hr tablet Take 1 tablet (500 mg total) by mouth daily with breakfast. (Patient taking differently: Take 500 mg by mouth 2 (two) times daily. ) 90 tablet 3  . montelukast (SINGULAIR) 10 MG tablet Take 1 tablet (10 mg total) by mouth daily. 30 tablet 5  . nitroGLYCERIN (NITROSTAT) 0.4 MG SL tablet Place 1 tablet (0.4 mg total) under the tongue every  5 (five) minutes as needed for chest pain. 40 tablet 1  . oxybutynin (DITROPAN-XL) 5 MG 24 hr tablet Take 1 tablet (5 mg total) by mouth at bedtime. 90 tablet 3  . ranitidine (ZANTAC) 150 MG tablet Take 0.5 tablets (75 mg total) by mouth 2 (two) times daily. 15 tablet 3   No current facility-administered medications on file prior to visit.     Review of Systems Constitutional: Negative for other unusual diaphoresis, sweats, appetite or weight changes HENT: Negative for other worsening hearing loss, ear pain, facial swelling, mouth sores or neck stiffness.   Eyes: Negative for other worsening pain, redness or other visual disturbance.  Respiratory: Negative for other stridor or swelling Cardiovascular: Negative for  other palpitations or other chest pain  Gastrointestinal: Negative for worsening diarrhea or loose stools, blood in stool, distention or other pain Genitourinary: Negative for hematuria, flank pain or other change in urine volume.  Musculoskeletal: Negative for myalgias or other joint swelling.  Skin: Negative for other color change, or other wound or worsening drainage.  Neurological: Negative for other syncope or numbness. Hematological: Negative for other adenopathy or swelling Psychiatric/Behavioral: Negative for hallucinations, other worsening agitation, SI, self-injury, or new decreased concentration All other system neg per pt    Objective:   Physical Exam BP 116/76   Pulse 62   Temp 97.8 F (36.6 C) (Oral)   Ht '5\' 11"'  (1.803 m)   Wt 162 lb (73.5 kg)   SpO2 96%   BMI 22.59 kg/m  VS noted,  Constitutional: Pt is oriented to person, place, and time. Appears well-developed and well-nourished, in no significant distress and comfortable Head: Normocephalic and atraumatic  Eyes: Conjunctivae and EOM are normal. Pupils are equal, round, and reactive to light Right Ear: External ear normal without discharge Left Ear: External ear normal without discharge Nose: Nose  without discharge or deformity Mouth/Throat: Oropharynx is without other ulcerations and moist  Neck: Normal range of motion. Neck supple. No JVD present. No tracheal deviation present or significant neck LA or mass Cardiovascular: Normal rate, regular rhythm, normal heart sounds and intact distal pulses. Pulmonary/Chest: WOB normal and breath sounds without rales or wheezing  Abdominal: Soft. Bowel sounds are normal. NT. No HSM  Musculoskeletal: Normal range of motion. Exhibits no edema Lymphadenopathy: Has no other cervical adenopathy.  Neurological: Pt is alert and oriented to person, place, and time. Pt has normal reflexes. No cranial nerve deficit. Motor grossly intact, Gait intact Skin: Skin is warm and dry. No rash noted or new ulcerations Psychiatric:  Has normal mood and affect. Behavior is normal without agitation No other exam findings  Lab Results  Component Value Date   WBC CANCELED 12/01/2017   HGB 14.3 08/19/2017   HCT 42.2 08/19/2017   PLT 215.0 08/19/2017   GLUCOSE 220 (H) 03/27/2018   CHOL 130 08/19/2017   TRIG (H) 08/19/2017    244.0 Triglyceride is over 400; calculations on Lipids are invalid.   HDL 28.40 (L) 08/19/2017   LDLDIRECT 66.0 08/19/2017   LDLCALC 63 12/21/2015   ALT 23 12/01/2017   AST 21 12/01/2017   NA 140 03/27/2018   K 4.1 03/27/2018   CL 103 03/27/2018   CREATININE 1.02 03/27/2018   BUN 19 03/27/2018   CO2 24 03/27/2018   TSH 2.03 08/19/2017   PSA 2.49 12/21/2012   INR 1.15 12/10/2016   HGBA1C 6.3 02/13/2018   MICROALBUR 3.4 (H) 08/19/2017       Assessment & Plan:

## 2018-05-19 NOTE — Assessment & Plan Note (Signed)
Ok for [employer] prn

## 2018-05-19 NOTE — Assessment & Plan Note (Signed)
stable overall by history and exam, recent data reviewed with pt, and pt to continue medical treatment as before,  to f/u any worsening symptoms or concerns Lab Results  Component Value Date   HGBA1C 7.2 (H) 05/19/2018

## 2018-05-19 NOTE — Assessment & Plan Note (Signed)

## 2018-05-19 NOTE — Patient Instructions (Signed)

## 2018-06-09 ENCOUNTER — Ambulatory Visit (INDEPENDENT_AMBULATORY_CARE_PROVIDER_SITE_OTHER): Payer: Medicare HMO | Admitting: Family

## 2018-06-09 ENCOUNTER — Encounter: Payer: Self-pay | Admitting: Family

## 2018-06-09 VITALS — BP 110/62 | HR 58 | Temp 97.6°F | Ht 71.0 in | Wt 159.8 lb

## 2018-06-09 DIAGNOSIS — K219 Gastro-esophageal reflux disease without esophagitis: Secondary | ICD-10-CM

## 2018-06-09 NOTE — Patient Instructions (Signed)
Please make sure you take your Zantac twice a day along with the Prevacid;      Food Choices for Gastroesophageal Reflux Disease, Adult When you have gastroesophageal reflux disease (GERD), the foods you eat and your eating habits are very important. Choosing the right foods can help ease your discomfort. What guidelines do I need to follow?  Choose fruits, vegetables, whole grains, and low-fat dairy products.  Choose low-fat meat, fish, and poultry.  Limit fats such as oils, salad dressings, butter, nuts, and avocado.  Keep a food diary. This helps you identify foods that cause symptoms.  Avoid foods that cause symptoms. These may be different for everyone.  Eat small meals often instead of 3 large meals a day.  Eat your meals slowly, in a place where you are relaxed.  Limit fried foods.  Cook foods using methods other than frying.  Avoid drinking alcohol.  Avoid drinking large amounts of liquids with your meals.  Avoid bending over or lying down until 2-3 hours after eating. What foods are not recommended? These are some foods and drinks that may make your symptoms worse: Vegetables Tomatoes. Tomato juice. Tomato and spaghetti sauce. Chili peppers. Onion and garlic. Horseradish. Fruits Oranges, grapefruit, and lemon (fruit and juice). Meats High-fat meats, fish, and poultry. This includes hot dogs, ribs, ham, sausage, salami, and bacon. Dairy Whole milk and chocolate milk. Sour cream. Cream. Butter. Ice cream. Cream cheese. Drinks Coffee and tea. Bubbly (carbonated) drinks or energy drinks. Condiments Hot sauce. Barbecue sauce. Sweets/Desserts Chocolate and cocoa. Donuts. Peppermint and spearmint. Fats and Oils High-fat foods. This includes [place] fries and potato chips. Other Vinegar. Strong spices. This includes black pepper, white pepper, red pepper, cayenne, curry powder, cloves, ginger, and chili powder. The items listed above may not be a complete list of  foods and drinks to avoid. Contact your dietitian for more information. This information is not intended to replace advice given to you by your health care provider. Make sure you discuss any questions you have with your health care provider. Document Released: 05/05/2012 Document Revised: 04/11/2016 Document Reviewed: 09/08/2013 Elsevier Interactive Patient Education  2017 [employer].

## 2018-06-09 NOTE — Progress Notes (Signed)
Keith Herrera is a 84 y.o. male with the following history as recorded in EpicCare:  Patient Active Problem List   Diagnosis Date Noted  . Vertigo 04/16/2018  . Left arm pain 02/15/2018  . Constipation 12/12/2017  . Allergic urticaria 12/01/2017  . Thrush 08/19/2017  . Weight loss 08/19/2017  . Diarrhea 08/19/2017  . Urinary frequency 08/19/2017  . Cataract, nuclear sclerotic, left eye 05/30/2017  . Cataract of both eyes 03/12/2017  . Open-angle glaucoma 12/30/2016  . Dysphagia 12/18/2016  . Unstable angina pectoris ([place])   . Abnormal nuclear stress test   . Chest pain 12/08/2016  . Primary open-angle glaucoma, left eye, severe stage 11/27/2016  . Cough 02/06/2016  . Dizziness 01/22/2016  . Pruritus 12/21/2015  . Lower back pain 11/24/2015  . SVT (supraventricular tachycardia) ([place]) 01/31/2015  . Type 2 diabetes mellitus with vascular disease ([place]) 01/31/2015  . Hives 02/22/2013  . Carotid bruit 01/15/2012  . Preventative health care 07/01/2011  . BENIGN PROSTATIC HYPERTROPHY 06/26/2010  . CAD, NATIVE VESSEL 11/02/2008  . INGUINAL HERNIA, RIGHT 10/28/2008  . ANXIETY 10/28/2007  . ESOPHAGEAL STRICTURE 10/28/2007  . NEPHROLITHIASIS, HX OF 10/28/2007  . Hyperlipidemia 07/22/2007  . ERECTILE DYSFUNCTION 07/22/2007  . Essential hypertension 07/22/2007  . Chronic rhinitis 07/22/2007  . GERD 07/22/2007  . DIVERTICULOSIS, COLON 07/22/2007  . IBS 07/22/2007  . COLONIC POLYPS, HX OF 07/22/2007  . HEMORRHOIDS, HX OF 07/22/2007    Current Outpatient Medications  Medication Sig Dispense Refill  . ACCU-CHEK AVIVA PLUS test strip USE FOR TESTING UP TO 4 TIMES DAILY AS DIRECTED 100 each 0  . amLODipine (NORVASC) 5 MG tablet TAKE 1 TABLET BY MOUTH EVERY DAY 90 tablet 3  . aspirin EC 81 MG tablet Take 81 mg by mouth daily after supper.    Marland Kitchen atorvastatin (LIPITOR) 10 MG tablet Take 1 tablet (10 mg total) by mouth daily at 6 PM. 30 tablet 5  . blood glucose meter kit and supplies  Dispense based on patient and insurance preference. Use up to four times daily as directed. (FOR ICD-10 E10.9, E11.9). 1 each 0  . Blood Glucose Monitoring Suppl (ONE TOUCH ULTRA 2) w/Device KIT Use as directed E 11.9 1 each 0  . brimonidine (ALPHAGAN) 0.2 % ophthalmic solution ADMINISTER 1 DROP TO BOTH EYES THREE (3) TIMES A DAY.  3  . carvedilol (COREG) 6.25 MG tablet TAKE 1 TABLET (6.25 MG TOTAL) BY MOUTH 2 (TWO) TIMES DAILY WITH A MEAL. 180 tablet 3  . fexofenadine (HM FEXOFENADINE HCL) 60 MG tablet Take 1 tablet (60 mg total) by mouth 2 (two) times daily as needed for allergies or rhinitis. 60 tablet 2  . glipiZIDE (GLUCOTROL XL) 5 MG 24 hr tablet Take 1 tablet (5 mg total) by mouth daily with breakfast. 90 tablet 3  . hydrOXYzine (ATARAX/VISTARIL) 10 MG tablet TAKE 1 TABLET BY MOUTH THREE TIMES A DAY AS NEEDED 60 tablet 2  . Lancets MISC Use as directed twice daily E11.9 200 each 3  . lansoprazole (PREVACID) 30 MG capsule TAKE 1 CAPSULE (30 MG TOTAL) BY MOUTH DAILY AT 12 NOON. 90 capsule 3  . latanoprost (XALATAN) 0.005 % ophthalmic solution Place 1 drop into both eyes at bedtime.  12  . linagliptin (TRADJENTA) 5 MG TABS tablet Take 1 tablet (5 mg total) by mouth daily. 90 tablet 3  . losartan (COZAAR) 100 MG tablet TAKE 1 TABLET EVERY DAY 90 tablet 0  . metFORMIN (GLUCOPHAGE-XR) 500 MG 24 hr  tablet Take 1 tablet (500 mg total) by mouth daily with breakfast. (Patient taking differently: Take 500 mg by mouth 2 (two) times daily. ) 90 tablet 3  . montelukast (SINGULAIR) 10 MG tablet Take 1 tablet (10 mg total) by mouth daily. 30 tablet 5  . nitroGLYCERIN (NITROSTAT) 0.4 MG SL tablet Place 1 tablet (0.4 mg total) under the tongue every 5 (five) minutes as needed for chest pain. 40 tablet 1  . oxybutynin (DITROPAN-XL) 5 MG 24 hr tablet Take 1 tablet (5 mg total) by mouth at bedtime. 90 tablet 3  . ranitidine (ZANTAC) 150 MG tablet Take 0.5 tablets (75 mg total) by mouth 2 (two) times daily. 15  tablet 3   No current facility-administered medications for this visit.     Allergies: Crestor [rosuvastatin calcium] and Lovastatin  Past Medical History:  Diagnosis Date  . Adenomatous polyp of colon 05/2004  . Allergic rhinitis   . Anxiety   . BPH (benign prostatic hyperplasia)   . CAD (coronary artery disease)    s/p Cypher DES to LAD and pD1 2010;  LHC was done 6/12: EF 65%, circumflex patent, mid RCA 80-90% (small and nondominant), LAD and diagonal stents patent, LAD at the origin of the first diagonal 30%, ostial D2 90%, mid 80-90% (small vessel).  Continued medical therapy was recommended.   . Carotid stenosis    dopplers 02/2012: 2-70% RICA; 62-37% LICA  . Diabetes type 2, controlled ([place])   . Diverticulosis    colon  . ED (erectile dysfunction)   . Esophageal stricture   . GERD (gastroesophageal reflux disease)   . Hemorrhoids   . Hiatal hernia   . Hyperlipidemia   . Hypertension   . IBS (irritable bowel syndrome)   . Right inguinal hernia    s/p repair in 8/12  . SVT (supraventricular tachycardia) (HCC)     Past Surgical History:  Procedure Laterality Date  . CARDIAC CATHETERIZATION N/A 12/10/2016   Procedure: Left Heart Cath and Coronary Angiography;  Surgeon: Jettie Booze, MD;  Location: [facility];  Service: Cardiovascular;  Laterality: N/A;  . CARDIAC CATHETERIZATION N/A 12/10/2016   Procedure: Coronary Balloon Angioplasty;  Surgeon: Jettie Booze, MD;  Location: [place] [facility];  Service: Cardiovascular;  Laterality: N/A;  . CARDIAC CATHETERIZATION N/A 12/10/2016   Procedure: Intravascular Pressure Wire/FFR Study;  Surgeon: Jettie Booze, MD;  Location: [facility];  Service: Cardiovascular;  Laterality: N/A;  . CORONARY STENT PLACEMENT  10/2008   x 2  . ELECTROPHYSIOLOGIC STUDY N/A 03/20/2015   no inducible SVT - Dr Lovena Le  . INGUINAL HERNIA REPAIR Right    RIH with ultrapro patch    Family History  Problem Relation Age of  Onset  . Bladder Cancer Mother 52  . Heart attack Father 56       Deceased  . Pancreatic cancer Sister 57  . Healthy Son   . Healthy Daughter     Social History   Tobacco Use  . Smoking status: Former Smoker    Types: Cigarettes    Last attempt to quit: 03/05/1956    Years since quitting: 62.3  . Smokeless tobacco: Never Used  . Tobacco comment: smoked in teens  Substance Use Topics  . Alcohol use: No    Alcohol/week: 0.0 oz    Subjective:  Patient presents with concerns for "increased gas" in the past 2 weeks; had stopped his Zantac but started that back 2 days ago; this  is not a new problem for this patient- has chronic history of GERD and has seen GI periodically; has been on Prevacid "for years." Notes that today is the best he has felt in a long time- questioning if it is safe to use the Zantac everyday with the Prevacid; denies any vomiting; increased gas; notes that since re-starting his Zantac 2 days ago, he has been feeling much better/ stools more formed; he is asking to have an updated referral to his GI to discuss this symptoms. He is concerned that he needs another endoscopy.   Objective:  Vitals:   06/09/18 0954  BP: 110/62  Pulse: (!) 58  Temp: 97.6 F (36.4 C)  TempSrc: Oral  SpO2: 99%  Weight: 159 lb 12.8 oz (72.5 kg)  Height: '5\' 11"'  (1.803 m)    General: Well developed, well nourished, in no acute distress  Skin : Warm and dry.  Head: Normocephalic and atraumatic  Lungs: Respirations unlabored; clear to auscultation bilaterally without wheeze, rales, rhonchi  CVS exam: normal rate and regular rhythm.  Abdomen: Soft; nontender; nondistended; normoactive bowel sounds; no masses or hepatosplenomegaly  Neurologic: Alert and oriented; speech intact; face symmetrical; moves all extremities well; CNII-XII intact without focal deficit   Assessment:  1. Gastroesophageal reflux disease, esophagitis presence not specified     Plan:  Reassurance for patient that  it is okay to take both Prevacid and Zantac; he is asking for referral back to his GI- Dr. Fuller Plan; he already has appt with GI next Friday- encouraged to keep that appointment/ explained that the NP will be able to manage his care also; Reviewed foods that can trigger GERD; Follow-up with Dr. Jenny Reichmann as needed otherwise.   No follow-ups on file.  Orders Placed This Encounter  Procedures  . Ambulatory referral to Gastroenterology    Referral Priority:   Routine    Referral Type:   Consultation    Referral Reason:   Specialty Services Required    Referred to Provider:   Ladene Artist, MD    Number of Visits Requested:   1    Requested Prescriptions    No prescriptions requested or ordered in this encounter

## 2018-06-15 ENCOUNTER — Other Ambulatory Visit: Payer: Self-pay | Admitting: Internal Medicine

## 2018-06-19 ENCOUNTER — Ambulatory Visit: Payer: Medicare HMO | Admitting: Nurse Practitioner

## 2018-06-19 ENCOUNTER — Encounter

## 2018-06-19 ENCOUNTER — Encounter: Payer: Self-pay | Admitting: Nurse Practitioner

## 2018-06-19 VITALS — BP 120/70 | HR 64 | Ht 71.0 in | Wt 158.4 lb

## 2018-06-19 DIAGNOSIS — R195 Other fecal abnormalities: Secondary | ICD-10-CM | POA: Diagnosis not present

## 2018-06-19 DIAGNOSIS — R109 Unspecified abdominal pain: Secondary | ICD-10-CM | POA: Diagnosis not present

## 2018-06-19 DIAGNOSIS — R140 Abdominal distension (gaseous): Secondary | ICD-10-CM

## 2018-06-19 NOTE — Patient Instructions (Addendum)
If you are age 82 or older, your body mass index should be between 23-30. Your Body mass index is 22.09 kg/m. If this is out of the aforementioned range listed, please consider follow up with your Primary Care Provider.  If you are age 42 or younger, your body mass index should be between 19-25. Your Body mass index is 22.09 kg/m. If this is out of the aformentioned range listed, please consider follow up with your Primary Care Provider.   Your provider has requested that you go to the basement level for lab work before leaving today. Press "B" on the elevator. The lab is located at the first door on the left as you exit the elevator.  STOP Kaopectate, see if recurrent diarrhea.  Start Visbiome (over-the-counter) At [facility]   Take two twice daily for two weeks, then take two daily thereafter.  Follow up with me on July 10, 2018 at 2 pm.  Thank you for choosing me and [facility].   [name], NP

## 2018-06-19 NOTE — Progress Notes (Signed)
P Primary GI:  Lucio Edward, MD   Chief Complaint:    Jodi Mourning, FNP  IMPRESSION and PLAN:    #1. 84 yo male here with complaints of gas / bloating / loose stool with lower abdominal discomfort. Only having 1-2 loose stools a day, not flagrant diarrhea but then he is taking Kaopectate sometimes. Normal BM today, patient wonders if it is because he stopped Tradjenta a couple of days ago  -asked him to hold Kaopectate. If recurrent loose stools then submit stool for c-diff (low suspicion). I looked up Tradjenta, diarrhea is not a common side effect.  -Trial of probiotics. He will try Visbiome. Some patients have had good success with this in treatment of loose stool / bloating.  -RTC in 3 or so weeks to see me. Call in interim if symptoms worsening.    #2.  GERD. On prevacid at lunch time for years. BID Zantac recently added. He didn't see any improvement with the gas / bloating. If this is the case then he can stop the Zantac.       HPI:     Patient is an 84 yo male with a history of GERD, chronic intermittent chest discomfort / belching and adenomatous colon polyps no longer requiring surveillance due to age. Patient here at request of PCP for evaluation of gas / abdominal discomfort. He has several GI concerns.   Over the last several weeks patient has been having bloating / gas, "rumbling stomach" and loose, non-bloody stool preceded by lower abdominal discomfort. Typically having no more than 1-2 BMs a day but they are all loose, no solid stools in a month. He has been taking kaopectate as needed but it can easily cause him to be constipated so takes it sparingly.  He still gets intermittent chest pain, says Cardiology doesn't feel it is cardiac pain. He isn't too concerned with the chronic chest pain. The lower abdominal discomfort / bloating / gas and bowel changes are bothersome for him. No N/V. Not eating much due to symptoms. He hasn't made any dietary changes to attribute  lower gi complaints to. No recent antibiotics. He has taken prevacid at lunch for years and PCP added Zantac in am and at night without major improvement in symptoms. Patient says he hasn't started any other new meds but then says Tradjend was started a few months ago He hasn't taken it in a couple of days (very expensive). Today was the best BM he has had.  Has been on metformin for a long time.   Review of systems:     No chest pain, no SOB, no fevers, no urinary sx   Past Medical History:  Diagnosis Date  . Adenomatous polyp of colon 05/2004  . Allergic rhinitis   . Anxiety   . BPH (benign prostatic hyperplasia)   . CAD (coronary artery disease)    s/p Cypher DES to LAD and pD1 2010;  LHC was done 6/12: EF 65%, circumflex patent, mid RCA 80-90% (small and nondominant), LAD and diagonal stents patent, LAD at the origin of the first diagonal 30%, ostial D2 90%, mid 80-90% (small vessel).  Continued medical therapy was recommended.   . Carotid stenosis    dopplers 02/2012: 7-68% RICA; 11-57% LICA  . Diabetes type 2, controlled ([place])   . Diverticulosis    colon  . ED (erectile dysfunction)   . Esophageal stricture   . GERD (gastroesophageal reflux disease)   . Hemorrhoids   . Hiatal  hernia   . Hyperlipidemia   . Hypertension   . IBS (irritable bowel syndrome)   . Right inguinal hernia    s/p repair in 8/12  . SVT (supraventricular tachycardia) (HCC)     Patient's surgical history, family medical history, social history, medications and allergies were all reviewed in Epic   Creatinine clearance cannot be calculated (Patient's most recent lab result is older than the maximum 21 days allowed.)  Current Outpatient Medications  Medication Sig Dispense Refill  . ACCU-CHEK AVIVA PLUS test strip USE FOR TESTING UP TO 4 TIMES DAILY AS DIRECTED 100 each 0  . amLODipine (NORVASC) 5 MG tablet TAKE 1 TABLET BY MOUTH EVERY DAY 90 tablet 3  . aspirin EC 81 MG tablet Take 81 mg by mouth daily  after supper.    Marland Kitchen atorvastatin (LIPITOR) 10 MG tablet Take 1 tablet (10 mg total) by mouth daily at 6 PM. 30 tablet 5  . blood glucose meter kit and supplies Dispense based on patient and insurance preference. Use up to four times daily as directed. (FOR ICD-10 E10.9, E11.9). 1 each 0  . Blood Glucose Monitoring Suppl (ONE TOUCH ULTRA 2) w/Device KIT Use as directed E 11.9 1 each 0  . brimonidine (ALPHAGAN) 0.2 % ophthalmic solution ADMINISTER 1 DROP TO BOTH EYES THREE (3) TIMES A DAY.  3  . carvedilol (COREG) 6.25 MG tablet TAKE 1 TABLET (6.25 MG TOTAL) BY MOUTH 2 (TWO) TIMES DAILY WITH A MEAL. 180 tablet 3  . fexofenadine (HM FEXOFENADINE HCL) 60 MG tablet Take 1 tablet (60 mg total) by mouth 2 (two) times daily as needed for allergies or rhinitis. 60 tablet 2  . glipiZIDE (GLUCOTROL XL) 5 MG 24 hr tablet Take 1 tablet (5 mg total) by mouth daily with breakfast. 90 tablet 3  . Lancets MISC Use as directed twice daily E11.9 200 each 3  . lansoprazole (PREVACID) 30 MG capsule TAKE 1 CAPSULE (30 MG TOTAL) BY MOUTH DAILY AT 12 NOON. 90 capsule 3  . latanoprost (XALATAN) 0.005 % ophthalmic solution Place 1 drop into both eyes at bedtime.  12  . losartan (COZAAR) 100 MG tablet TAKE 1 TABLET EVERY DAY 90 tablet 0  . metFORMIN (GLUCOPHAGE-XR) 500 MG 24 hr tablet Take 1 tablet (500 mg total) by mouth daily with breakfast. (Patient taking differently: Take 500 mg by mouth 2 (two) times daily. ) 90 tablet 3  . nitroGLYCERIN (NITROSTAT) 0.4 MG SL tablet Place 1 tablet (0.4 mg total) under the tongue every 5 (five) minutes as needed for chest pain. 40 tablet 1  . oxybutynin (DITROPAN-XL) 5 MG 24 hr tablet Take 1 tablet (5 mg total) by mouth at bedtime. 90 tablet 3  . ranitidine (ZANTAC) 150 MG tablet Take 0.5 tablets (75 mg total) by mouth 2 (two) times daily. 15 tablet 3  . TRADJENTA 5 MG TABS tablet TAKE 1 TABLET BY MOUTH EVERY DAY (Patient not taking: Reported on 06/19/2018) 30 tablet 11   No current  facility-administered medications for this visit.     Physical Exam:     BP 120/70   Pulse 64   Ht '5\' 11"'  (1.803 m)   Wt 158 lb 6.4 oz (71.8 kg)   SpO2 96%   BMI 22.09 kg/m   GENERAL:  Pleasant male in NAD PSYCH: : Cooperative, normal affect EENT:  conjunctiva pink, mucous membranes moist, neck supple without masses CARDIAC:  RRR,no peripheral edema PULM: Normal respiratory effort, lungs CTA bilaterally, no wheezing  ABDOMEN:  Nondistended, soft, nontender. No obvious masses, no hepatomegaly,  normal bowel sounds SKIN:  turgor, no lesions seen Musculoskeletal:  Normal muscle tone, normal strength NEURO: Alert and oriented x 3, no focal neurologic deficits   [name] , NP 06/19/2018, 11:38 AM

## 2018-06-22 ENCOUNTER — Encounter: Payer: Self-pay | Admitting: Nurse Practitioner

## 2018-06-24 DIAGNOSIS — Z961 Presence of intraocular lens: Secondary | ICD-10-CM | POA: Diagnosis not present

## 2018-06-24 DIAGNOSIS — H21233 Degeneration of iris (pigmentary), bilateral: Secondary | ICD-10-CM | POA: Diagnosis not present

## 2018-06-25 ENCOUNTER — Other Ambulatory Visit: Payer: Self-pay | Admitting: Internal Medicine

## 2018-06-26 ENCOUNTER — Other Ambulatory Visit: Payer: Self-pay | Admitting: Internal Medicine

## 2018-06-29 NOTE — Progress Notes (Signed)
Reviewed and agree with initial management plan.  Daija Routson T. Derra Shartzer, MD FACG 

## 2018-07-06 ENCOUNTER — Ambulatory Visit: Payer: Medicare HMO | Admitting: Cardiovascular Disease

## 2018-07-06 ENCOUNTER — Encounter: Payer: Self-pay | Admitting: Cardiovascular Disease

## 2018-07-06 VITALS — BP 122/60 | HR 61 | Ht 71.0 in | Wt 162.8 lb

## 2018-07-06 DIAGNOSIS — I10 Essential (primary) hypertension: Secondary | ICD-10-CM | POA: Diagnosis not present

## 2018-07-06 DIAGNOSIS — I2510 Atherosclerotic heart disease of native coronary artery without angina pectoris: Secondary | ICD-10-CM

## 2018-07-06 NOTE — Progress Notes (Signed)
Cardiology Office Note Date:  07/06/2018   ID:  Keith Herrera, DOB March 01, 1932, MRN [id number]  PCP:  [name], MD  Cardiologist:  Sherren Mocha, MD    Chief Complaint  Patient presents with  . Follow-up    CAD     History of Present Illness: Keith Herrera is a 84 y.o. male who presents for follow-up of CAD. He underwent LAD and diagonal stenting in 2010, then underwent diagonal angioplasty in 2018.   He's here with his wife today. Still walking on the treadmill about 20 minutes/day.  He denies any exertional symptoms.  Specifically denies exertional chest pain or shortness of breath.  He has had some discomfort in the upper abdomen and chest that he attributes to "gas."  He was given some medication by Dr. Fuller Plan and states his symptoms resolved with that.  He denies orthopnea, PND, or heart palpitations.  He denies lightheadedness or syncope.   Past Medical History:  Diagnosis Date  . Adenomatous polyp of colon 05/2004  . Allergic rhinitis   . Anxiety   . BPH (benign prostatic hyperplasia)   . CAD (coronary artery disease)    s/p Cypher DES to LAD and pD1 2010;  LHC was done 6/12: EF 65%, circumflex patent, mid RCA 80-90% (small and nondominant), LAD and diagonal stents patent, LAD at the origin of the first diagonal 30%, ostial D2 90%, mid 80-90% (small vessel).  Continued medical therapy was recommended.   . Carotid stenosis    dopplers 02/2012: 4-81% RICA; 85-63% LICA  . Diabetes type 2, controlled ([place])   . Diverticulosis    colon  . ED (erectile dysfunction)   . Esophageal stricture   . GERD (gastroesophageal reflux disease)   . Hemorrhoids   . Hiatal hernia   . Hyperlipidemia   . Hypertension   . IBS (irritable bowel syndrome)   . Right inguinal hernia    s/p repair in 8/12  . SVT (supraventricular tachycardia) (HCC)     Past Surgical History:  Procedure Laterality Date  . CARDIAC CATHETERIZATION N/A 12/10/2016   Procedure: Left Heart Cath and  Coronary Angiography;  Surgeon: Jettie Booze, MD;  Location: [facility];  Service: Cardiovascular;  Laterality: N/A;  . CARDIAC CATHETERIZATION N/A 12/10/2016   Procedure: Coronary Balloon Angioplasty;  Surgeon: Jettie Booze, MD;  Location: [facility];  Service: Cardiovascular;  Laterality: N/A;  . CARDIAC CATHETERIZATION N/A 12/10/2016   Procedure: Intravascular Pressure Wire/FFR Study;  Surgeon: Jettie Booze, MD;  Location: [facility];  Service: Cardiovascular;  Laterality: N/A;  . CORONARY STENT PLACEMENT  10/2008   x 2  . ELECTROPHYSIOLOGIC STUDY N/A 03/20/2015   no inducible SVT - Dr Lovena Le  . INGUINAL HERNIA REPAIR Right    RIH with ultrapro patch    Current Outpatient Medications  Medication Sig Dispense Refill  . ACCU-CHEK AVIVA PLUS test strip USE FOR TESTING UP TO 4 TIMES DAILY AS DIRECTED 100 each 0  . amLODipine (NORVASC) 5 MG tablet TAKE 1 TABLET BY MOUTH EVERY DAY 90 tablet 3  . aspirin EC 81 MG tablet Take 81 mg by mouth daily after supper.    Marland Kitchen atorvastatin (LIPITOR) 10 MG tablet Take 1 tablet (10 mg total) by mouth daily at 6 PM. 30 tablet 5  . blood glucose meter kit and supplies Dispense based on patient and insurance preference. Use up to four times daily as directed. (FOR ICD-10 E10.9, E11.9). 1  each 0  . Blood Glucose Monitoring Suppl (ONE TOUCH ULTRA 2) w/Device KIT Use as directed E 11.9 1 each 0  . brimonidine (ALPHAGAN) 0.2 % ophthalmic solution ADMINISTER 1 DROP TO BOTH EYES THREE (3) TIMES A DAY.  3  . carvedilol (COREG) 6.25 MG tablet TAKE 1 TABLET (6.25 MG TOTAL) BY MOUTH 2 (TWO) TIMES DAILY WITH A MEAL. 180 tablet 3  . fexofenadine (HM FEXOFENADINE HCL) 60 MG tablet Take 1 tablet (60 mg total) by mouth 2 (two) times daily as needed for allergies or rhinitis. 60 tablet 2  . glipiZIDE (GLUCOTROL XL) 5 MG 24 hr tablet Take 1 tablet (5 mg total) by mouth daily with breakfast. 90 tablet 3  . Lancets MISC Use as directed twice  daily E11.9 200 each 3  . lansoprazole (PREVACID) 30 MG capsule TAKE 1 CAPSULE (30 MG TOTAL) BY MOUTH DAILY AT 12 NOON. 90 capsule 3  . latanoprost (XALATAN) 0.005 % ophthalmic solution Place 1 drop into both eyes at bedtime.  12  . losartan (COZAAR) 100 MG tablet TAKE 1 TABLET EVERY DAY 90 tablet 3  . metFORMIN (GLUCOPHAGE) 500 MG tablet Take by mouth daily with breakfast.    . nitroGLYCERIN (NITROSTAT) 0.4 MG SL tablet Place 1 tablet (0.4 mg total) under the tongue every 5 (five) minutes as needed for chest pain. 40 tablet 1  . ofloxacin (OCUFLOX) 0.3 % ophthalmic solution ADMINISTER 1 DROP INTO LEFT EYE 4 TIMES DAILY FOR 10 DAYS    . ranitidine (ZANTAC) 150 MG tablet Take 0.5 tablets (75 mg total) by mouth 2 (two) times daily. 15 tablet 3  . TRADJENTA 5 MG TABS tablet TAKE 1 TABLET BY MOUTH EVERY DAY 30 tablet 11  . metFORMIN (GLUCOPHAGE-XR) 500 MG 24 hr tablet Take 1 tablet (500 mg total) by mouth daily with breakfast. (Patient not taking: Reported on 07/06/2018) 90 tablet 3  . oxybutynin (DITROPAN-XL) 5 MG 24 hr tablet Take 1 tablet (5 mg total) by mouth at bedtime. (Patient not taking: Reported on 07/06/2018) 90 tablet 3   No current facility-administered medications for this visit.    Allergies:   Crestor [rosuvastatin calcium] and Lovastatin   Social History:  The patient  reports that he quit smoking about 62 years ago. His smoking use included cigarettes. He has never used smokeless tobacco. He reports that he does not drink alcohol or use drugs.   Family History:  The patient's family history includes Bladder Cancer (age of onset: 75) in his mother; Healthy in his daughter and son; Heart attack (age of onset: 44) in his father; Pancreatic cancer (age of onset: 36) in his sister.   ROS:  Please see the history of present illness. All other systems are reviewed and negative.   PHYSICAL EXAM: VS:  BP 122/60   Pulse 61   Ht '5\' 11"'  (1.803 m)   Wt 162 lb 12.8 oz (73.8 kg)   SpO2 98%    BMI 22.71 kg/m  , BMI Body mass index is 22.71 kg/m. GEN: Well nourished, well developed, pleasant elderly male in no acute distress  HEENT: normal  Neck: no JVD, no masses. Right carotid bruit Cardiac: RRR without murmur or gallop                Respiratory:  clear to auscultation bilaterally, normal work of breathing GI: soft, nontender, nondistended, + BS MS: no deformity or atrophy  Ext: no pretibial edema, pedal pulses 2+= bilaterally Skin: warm and dry, no  rash Neuro:  Strength and sensation are intact Psych: euthymic mood, full affect  EKG:  EKG is not ordered today.  Recent Labs: 03/27/2018: Magnesium 1.9 05/19/2018: ALT 18; BUN 17; Creatinine, Ser 1.15; Hemoglobin 15.6; Platelets 202.0; Potassium 4.7; Sodium 139; TSH 2.49   Lipid Panel     Component Value Date/Time   CHOL 134 05/19/2018 1058   TRIG 229.0 (H) 05/19/2018 1058   TRIG 128 11/19/2006 1032   HDL 31.00 (L) 05/19/2018 1058   CHOLHDL 4 05/19/2018 1058   VLDL 45.8 (H) 05/19/2018 1058   LDLCALC 63 12/21/2015 1044   LDLDIRECT 82.0 05/19/2018 1058      Wt Readings from Last 3 Encounters:  07/06/18 162 lb 12.8 oz (73.8 kg)  06/19/18 158 lb 6.4 oz (71.8 kg)  06/09/18 159 lb 12.8 oz (72.5 kg)     Cardiac Studies Reviewed: Echo 12-09-2016: Study Conclusions  - Left ventricle: The cavity size was normal. Systolic function was   vigorous. The estimated ejection fraction was in the range of 65%   to 70%. Wall motion was normal; there were no regional wall   motion abnormalities. Doppler parameters are consistent with   abnormal left ventricular relaxation (grade 1 diastolic   dysfunction). Doppler parameters are consistent with elevated   ventricular end-diastolic filling pressure. - Aortic valve: Trileaflet; normal thickness leaflets. There was no   regurgitation. - Aortic root: The aortic root was normal in size. - Mitral valve: Calcified annulus. Mildly thickened leaflets .   Transvalvular velocity was  within the normal range. There was no   evidence for stenosis. There was no regurgitation. - Left atrium: The atrium was normal in size. - Right ventricle: Systolic function was normal. - Right atrium: The atrium was normal in size. - Tricuspid valve: There was mild regurgitation. - Pulmonary arteries: Systolic pressure was within the normal   range. - Inferior vena cava: The vessel was normal in size. - Pericardium, extracardiac: There was no pericardial effusion.  Cardiac Cath 12-10-2016: Conclusion     Non-dominant vessel, Mid RCA lesion, 100 %stenosed. This has progressed since 2012. There are right to right and left to right collaterals.  Patent stent in the first Diag.  Patent proximal LAD stent.  Ost 2nd Diag to mid 2nd Diag lesion, 75 %stenosed. Stable disease since 2012.  1st Diag lesion, 80 %stenosed. This lesion was past the prior stent. After PTCA with a 2.0 balloon, a stent could not be delivered due to the prior LAD and diagional stents.  Post intervention with PTCA, there is a 10% residual stenosis.  LV end diastolic pressure is normal.  There is no aortic valve stenosis.  50% mid LAD lesion which was assessed by FFR. Resting FFR was 0.89 and after adenosine, FFR dropped to 0.82.   Continue dual antiplatelet therapy for at least a month. No stent was actually placed. The stent could not be delivered due to the prior LAD and diagonal stents. There is an excellent angioplasty result.  FFR of the mid LAD was negative. Continue aggressive medical therapy. Can consider continuing clopidogrel long-term.   ASSESSMENT AND PLAN: 1.  CAD, native vessel, without angina: reviewed most recent cath study. Will continue medical therapy. Chest pain symptoms likely related to GI symptoms, now resolved with treatment. No exertional chest pain or pressure.   2.  Hypertension: BP well-controlled and seems to be tolerating current Rx without side effects  3.  Hyperlipidemia:  treated with atorvastatin  4.  Type 2 diabetes:  followed by PCP, treated with oral hypoglycemics  5.  Heart palpitations.  He has undergone evaluation by Dr. Lovena Le with limited treatment options in the setting of his coronary artery disease.  Observation recommended.  Current medicines are reviewed with the patient today.  The patient does not have concerns regarding medicines.  Labs/ tests ordered today include:  No orders of the defined types were placed in this encounter.   Disposition:   FU one year  Signed, Sherren Mocha, MD  07/06/2018 3:10 PM    [facility] [address], [place], [place]  [address] Phone: [phone]; Fax: [phone]

## 2018-07-06 NOTE — Patient Instructions (Signed)

## 2018-07-10 ENCOUNTER — Encounter: Payer: Self-pay | Admitting: Nurse Practitioner

## 2018-07-10 ENCOUNTER — Ambulatory Visit: Payer: Medicare HMO | Admitting: Nurse Practitioner

## 2018-07-10 VITALS — BP 116/60 | HR 76 | Ht 69.5 in | Wt 163.2 lb

## 2018-07-10 DIAGNOSIS — R143 Flatulence: Secondary | ICD-10-CM

## 2018-07-10 DIAGNOSIS — R194 Change in bowel habit: Secondary | ICD-10-CM | POA: Diagnosis not present

## 2018-07-10 NOTE — Patient Instructions (Signed)
If you are age 82 or older, your body mass index should be between 23-30. Your Body mass index is 23.75 kg/m. If this is out of the aforementioned range listed, please consider follow up with your Primary Care Provider.  If you are age 53 or younger, your body mass index should be between 19-25. Your Body mass index is 23.75 kg/m. If this is out of the aformentioned range listed, please consider follow up with your Primary Care Provider.   Use Glycerin suppositories as needed.  STOP Zantac.  Use Gas X as needed.  You have been given a Cytogeneticist.  Try and elevate feet 6 inches during bowel movement.  Thank you for choosing me and [facility].   [name], NP

## 2018-07-10 NOTE — Progress Notes (Signed)
P Primary GI:  Keith Edward, MD        Chief Complaint:    Follow up  IMPRESSION and PLAN:    #1. 84 yo male here for follow up of gas / bloating / loose stool with lower abdominal discomfort. Symptoms have improved after started Visbiome probiotics, now tending toward constipation described as increase difficulty expelling stool.   -completed bottle of Visbiome, he wiill see how he feels off Probiotics. If need be,  he can resume.   -trial of glycerin supp as needed -RTC in 3 or so weeks to see me. Call in interim if symptoms worsening.      #2. GERD. He has taken Prevacid at lunch time for years. BID Zantac recently added but he didn't see any improvement with the gas / bloating. Given this will stop the Zantac.   #3. Chest pressure, non-exertional and alleviated by belching.  -Addition of Zantac didn't help so will discontinue.  -continue daily PPI -continue Gas-x as needed.  - Discussed role of certain foods in gas production and Gas brochure given           HPI:     Patient is an 84 year old male with DM2, HTN, CAD / stents / hx of SVT and carotid stenosis, who I saw a few weeks ago for gas / bloating, lower abdominal discomfort and loose stool. After holding Tradjenta for a couple of days patient felt like his GI sx improved. I started him on Visbiome probiotics , he returns for follow up. He restarted Tradjenta. Feels Visbiome helped. Bloating has resolved. No longer having loose stools, in fact he is now tending toward constipation. He main issue now is sensation of gas  / pressure in chest. The pressure is not exertional,  alleviated when able to belch. No SOB. He drinks carbonated beverages sometimes to facilitate the belching. Gas-x helps as well.    Review of systems:     No chest pain, no SOB, no fevers, no urinary sx   Past Medical History:  Diagnosis Date  . Adenomatous polyp of colon 05/2004  . Allergic rhinitis   . Anxiety   . BPH (benign prostatic  hyperplasia)   . CAD (coronary artery disease)    s/p Cypher DES to LAD and pD1 2010;  LHC was done 6/12: EF 65%, circumflex patent, mid RCA 80-90% (small and nondominant), LAD and diagonal stents patent, LAD at the origin of the first diagonal 30%, ostial D2 90%, mid 80-90% (small vessel).  Continued medical therapy was recommended.   . Carotid stenosis    dopplers 02/2012: 0-26% RICA; 37-85% LICA  . Diabetes type 2, controlled ([place])   . Diverticulosis    colon  . ED (erectile dysfunction)   . Esophageal stricture   . GERD (gastroesophageal reflux disease)   . Hemorrhoids   . Hiatal hernia   . Hyperlipidemia   . Hypertension   . IBS (irritable bowel syndrome)   . Right inguinal hernia    s/p repair in 8/12  . SVT (supraventricular tachycardia) (HCC)     Patient's surgical history, family medical history, social history, medications and allergies were all reviewed in Epic   Creatinine clearance cannot be calculated (Patient's most recent lab result is older than the maximum 21 days allowed.)  Current Outpatient Medications  Medication Sig Dispense Refill  . ACCU-CHEK AVIVA PLUS test strip USE FOR TESTING UP TO 4 TIMES DAILY AS DIRECTED 100 each 0  . amLODipine (  NORVASC) 5 MG tablet TAKE 1 TABLET BY MOUTH EVERY DAY 90 tablet 3  . aspirin EC 81 MG tablet Take 81 mg by mouth daily after supper.    Marland Kitchen atorvastatin (LIPITOR) 10 MG tablet Take 1 tablet (10 mg total) by mouth daily at 6 PM. 30 tablet 5  . blood glucose meter kit and supplies Dispense based on patient and insurance preference. Use up to four times daily as directed. (FOR ICD-10 E10.9, E11.9). 1 each 0  . Blood Glucose Monitoring Suppl (ONE TOUCH ULTRA 2) w/Device KIT Use as directed E 11.9 1 each 0  . brimonidine (ALPHAGAN) 0.2 % ophthalmic solution ADMINISTER 1 DROP TO BOTH EYES THREE (3) TIMES A DAY.  3  . carvedilol (COREG) 6.25 MG tablet TAKE 1 TABLET (6.25 MG TOTAL) BY MOUTH 2 (TWO) TIMES DAILY WITH A MEAL. 180 tablet 3    . fexofenadine (HM FEXOFENADINE HCL) 60 MG tablet Take 1 tablet (60 mg total) by mouth 2 (two) times daily as needed for allergies or rhinitis. 60 tablet 2  . glipiZIDE (GLUCOTROL XL) 5 MG 24 hr tablet Take 1 tablet (5 mg total) by mouth daily with breakfast. 90 tablet 3  . Lancets MISC Use as directed twice daily E11.9 200 each 3  . lansoprazole (PREVACID) 30 MG capsule TAKE 1 CAPSULE (30 MG TOTAL) BY MOUTH DAILY AT 12 NOON. 90 capsule 3  . latanoprost (XALATAN) 0.005 % ophthalmic solution Place 1 drop into both eyes at bedtime.  12  . losartan (COZAAR) 100 MG tablet TAKE 1 TABLET EVERY DAY 90 tablet 3  . metFORMIN (GLUCOPHAGE) 500 MG tablet Take by mouth daily with breakfast.    . nitroGLYCERIN (NITROSTAT) 0.4 MG SL tablet Place 1 tablet (0.4 mg total) under the tongue every 5 (five) minutes as needed for chest pain. 40 tablet 1  . oxybutynin (DITROPAN-XL) 5 MG 24 hr tablet Take 1 tablet (5 mg total) by mouth at bedtime. 90 tablet 3  . Probiotic Product (VSL#3 PO) Take by mouth.    . ranitidine (ZANTAC) 150 MG tablet Take 0.5 tablets (75 mg total) by mouth 2 (two) times daily. 15 tablet 3  . TRADJENTA 5 MG TABS tablet TAKE 1 TABLET BY MOUTH EVERY DAY 30 tablet 11   No current facility-administered medications for this visit.     Physical Exam:     BP 116/60   Pulse 76   Ht 5' 9.5" (1.765 m)   Wt 163 lb 3.2 oz (74 kg)   BMI 23.75 kg/m   GENERAL:  Pleasant male in NAD PSYCH: : Cooperative, normal affect CARDIAC:  RRR,  no peripheral edema PULM: Normal respiratory effort ABDOMEN:  Nondistended, soft, nontender. No obvious masses, no hepatomegaly,  normal bowel sounds NEURO: Alert and oriented x 3, no focal neurologic deficits   Keith Herrera , NP 07/10/2018, 2:35 PM

## 2018-07-12 ENCOUNTER — Other Ambulatory Visit: Payer: Self-pay | Admitting: Internal Medicine

## 2018-07-14 ENCOUNTER — Encounter: Payer: Self-pay | Admitting: Nurse Practitioner

## 2018-07-14 ENCOUNTER — Other Ambulatory Visit: Payer: Self-pay | Admitting: Nurse Practitioner

## 2018-07-14 NOTE — Progress Notes (Signed)
Reviewed and agree with management plan.  Onyx Schirmer T. Louanna Vanliew, MD FACG 

## 2018-07-15 ENCOUNTER — Other Ambulatory Visit: Payer: Self-pay | Admitting: Internal Medicine

## 2018-08-27 ENCOUNTER — Other Ambulatory Visit: Payer: Self-pay | Admitting: Internal Medicine

## 2018-10-09 ENCOUNTER — Other Ambulatory Visit: Payer: Self-pay | Admitting: Internal Medicine

## 2018-11-02 DIAGNOSIS — N401 Enlarged prostate with lower urinary tract symptoms: Secondary | ICD-10-CM | POA: Diagnosis not present

## 2018-11-02 DIAGNOSIS — R351 Nocturia: Secondary | ICD-10-CM | POA: Diagnosis not present

## 2018-11-18 HISTORY — PX: OTHER SURGICAL HISTORY: SHX169

## 2018-11-25 ENCOUNTER — Telehealth: Payer: Self-pay

## 2018-11-25 ENCOUNTER — Ambulatory Visit (INDEPENDENT_AMBULATORY_CARE_PROVIDER_SITE_OTHER): Payer: Medicare HMO | Admitting: Internal Medicine

## 2018-11-25 ENCOUNTER — Encounter: Payer: Self-pay | Admitting: Internal Medicine

## 2018-11-25 ENCOUNTER — Ambulatory Visit (INDEPENDENT_AMBULATORY_CARE_PROVIDER_SITE_OTHER)
Admission: RE | Admit: 2018-11-25 | Discharge: 2018-11-25 | Disposition: A | Discharge status: Home / Self Care | Payer: Medicare HMO | Source: Ambulatory Visit | Attending: Internal Medicine | Admitting: Internal Medicine

## 2018-11-25 ENCOUNTER — Other Ambulatory Visit: Payer: Self-pay | Admitting: Internal Medicine

## 2018-11-25 ENCOUNTER — Other Ambulatory Visit (INDEPENDENT_AMBULATORY_CARE_PROVIDER_SITE_OTHER): Payer: Medicare HMO

## 2018-11-25 VITALS — BP 124/64 | HR 84 | Temp 97.7°F | Ht 69.5 in | Wt 157.0 lb

## 2018-11-25 DIAGNOSIS — L509 Urticaria, unspecified: Secondary | ICD-10-CM | POA: Diagnosis not present

## 2018-11-25 DIAGNOSIS — G4700 Insomnia, unspecified: Secondary | ICD-10-CM | POA: Diagnosis not present

## 2018-11-25 DIAGNOSIS — E1159 Type 2 diabetes mellitus with other circulatory complications: Secondary | ICD-10-CM | POA: Diagnosis not present

## 2018-11-25 DIAGNOSIS — R05 Cough: Secondary | ICD-10-CM

## 2018-11-25 DIAGNOSIS — E785 Hyperlipidemia, unspecified: Secondary | ICD-10-CM

## 2018-11-25 DIAGNOSIS — L299 Pruritus, unspecified: Secondary | ICD-10-CM | POA: Diagnosis not present

## 2018-11-25 DIAGNOSIS — Z23 Encounter for immunization: Secondary | ICD-10-CM

## 2018-11-25 DIAGNOSIS — I10 Essential (primary) hypertension: Secondary | ICD-10-CM | POA: Diagnosis not present

## 2018-11-25 DIAGNOSIS — R059 Cough, unspecified: Secondary | ICD-10-CM

## 2018-11-25 LAB — LIPID PANEL
CHOL/HDL RATIO: 4
Cholesterol: 127 mg/dL (ref 0–200)
HDL: 35.9 mg/dL — ABNORMAL LOW (ref >39.00)
LDL Cholesterol: 63 mg/dL (ref 0–99)
NONHDL: 90.93
Triglycerides: 140 mg/dL (ref 0.0–149.0)
VLDL: 28 mg/dL (ref 0.0–40.0)

## 2018-11-25 LAB — HEMOGLOBIN A1C: Hgb A1c MFr Bld: 9.4 % — ABNORMAL HIGH (ref 4.6–6.5)

## 2018-11-25 LAB — BASIC METABOLIC PANEL
BUN: 23 mg/dL (ref 6–23)
CO2: 26 meq/L (ref 19–32)
Calcium: 9.2 mg/dL (ref 8.4–10.5)
Chloride: 104 mEq/L (ref 96–112)
Creatinine, Ser: 1.03 mg/dL (ref 0.40–1.50)
GFR: 73 mL/min (ref >60.00)
Glucose, Bld: 222 mg/dL — ABNORMAL HIGH (ref 70–99)
Potassium: 4.4 mEq/L (ref 3.5–5.1)
Sodium: 137 mEq/L (ref 135–145)

## 2018-11-25 LAB — HEPATIC FUNCTION PANEL
ALT: 17 U/L (ref 0–53)
AST: 14 U/L (ref 0–37)
Albumin: 3.9 g/dL (ref 3.5–5.2)
Alkaline Phosphatase: 55 U/L (ref 39–117)
Bilirubin, Direct: 0.2 mg/dL (ref 0.0–0.3)
Total Bilirubin: 0.7 mg/dL (ref 0.2–1.2)
Total Protein: 6.6 g/dL (ref 6.0–8.3)

## 2018-11-25 MED ORDER — TRAZODONE HCL 50 MG PO TABS: 25.0000 mg | ORAL_TABLET | Freq: Every evening | ORAL | 1 refills | Status: DC | PRN

## 2018-11-25 MED ORDER — METFORMIN HCL ER 500 MG PO TB24: 1000.0000 mg | ORAL_TABLET | Freq: Every day | ORAL | 3 refills | Status: DC

## 2018-11-25 MED ORDER — METHYLPREDNISOLONE ACETATE 80 MG/ML IJ SUSP
80.0000 mg | Freq: Once | INTRAMUSCULAR | Status: AC
  Administered 2018-11-25: 80 mg via INTRAMUSCULAR

## 2018-11-25 NOTE — Telephone Encounter (Signed)
-----   Message from [name], MD sent at 11/25/2018 12:49 PM EST ----- Left message on MyChart, pt to cont same tx except  The test results show that your current treatment is OK, except the A1c is is quite a bit higher.  Remember to watch your diet, and increase the metformin ER 500 mg to 2 pills in the AM.  I will send a new prescription, and you should hear from the office as well.Redmond Baseman to please inform pt, I will do rx

## 2018-11-25 NOTE — Patient Instructions (Signed)
You had the Tdap Tetanus shot today  You had the steroid shot today  Please take all new medication as prescribed - the trazodone at bedtime to help with sleep  Please continue all other medications as before, and refills have been done if requested.  Please have the pharmacy call with any other refills you may need.  Please continue your efforts at being more active, low cholesterol diet, and weight control.  You are otherwise up to date with prevention measures today.  Please keep your appointments with your specialists as you may have planned  Please go to the XRAY Department in the Basement (go straight as you get off the elevator) for the x-ray testing  Please go to the LAB in the Basement (turn left off the elevator) for the tests to be done today  You will be contacted by phone if any changes need to be made immediately.  Otherwise, you will receive a letter about your results with an explanation, but please check with MyChart first.  Please remember to sign up for MyChart if you have not done so, as this will be important to you in the future with finding out test results, communicating by private email, and scheduling acute appointments online when needed.  Please return in 6 months, or sooner if needed, with Lab testing done 3-5 days before

## 2018-11-25 NOTE — Telephone Encounter (Signed)
Pt has been informed of results and expressed understanding.  °

## 2018-11-25 NOTE — Progress Notes (Signed)
Subjective:    Patient ID: Keith Herrera, male    DOB: 1932-06-20, 85 y.o.   MRN: [id number]  HPI  Here to f/u; overall doing ok,  Pt denies chest pain, increasing sob or doe, wheezing, orthopnea, PND, increased LE swelling, palpitations, dizziness or syncope.  Pt denies new neurological symptoms such as new headache, or facial or extremity weakness or numbness.  Pt denies polydipsia, polyuria, or low sugar episode Sugars have been higher in the past month with an ill defined cough like illnesss and reduced appetite and wt loss  Has not taken the tradjenta in 2019 due to cost, but 2020 insurance now covers and now taking for the last wk.   .  Pt states overall good compliance with meds, mostly trying to follow appropriate diet, with wt overall stable,  but little exercise however.  Has had non prod hacking cough since xmas time without fever, and overall finally starting to improve.  Has seen allergy for itching, but zantac no help.  Also cannot sleep well, hard to get to sleep and then worse even to try to stay asleep, Itching makes the whole issue worse.  Not eating as well in the past month and thinks may have lost a few lbs.  Also still doing treadmill between 2-3 mph for 30 min daily Wt Readings from Last 3 Encounters:  11/25/18 157 lb (71.2 kg)  07/10/18 163 lb 3.2 oz (74 kg)  07/06/18 162 lb 12.8 oz (73.8 kg)  Did see urology and got some tx with what sounds like flomax or finasteride and to f/u at 3 mo for BPH with NP.     Pt denies fever, wt loss, night sweats, loss of appetite, or other constitutional symptoms Past Medical History:  Diagnosis Date  . Adenomatous polyp of colon 05/2004  . Allergic rhinitis   . Anxiety   . BPH (benign prostatic hyperplasia)   . CAD (coronary artery disease)    s/p Cypher DES to LAD and pD1 2010;  LHC was done 6/12: EF 65%, circumflex patent, mid RCA 80-90% (small and nondominant), LAD and diagonal stents patent, LAD at the origin of the first diagonal  30%, ostial D2 90%, mid 80-90% (small vessel).  Continued medical therapy was recommended.   . Carotid stenosis    dopplers 02/2012: 8-52% RICA; 77-82% LICA  . Diabetes type 2, controlled ([place])   . Diverticulosis    colon  . ED (erectile dysfunction)   . Esophageal stricture   . GERD (gastroesophageal reflux disease)   . Hemorrhoids   . Hiatal hernia   . Hyperlipidemia   . Hypertension   . IBS (irritable bowel syndrome)   . Right inguinal hernia    s/p repair in 8/12  . SVT (supraventricular tachycardia) (HCC)    Past Surgical History:  Procedure Laterality Date  . CARDIAC CATHETERIZATION N/A 12/10/2016   Procedure: Left Heart Cath and Coronary Angiography;  Surgeon: Jettie Booze, MD;  Location: [facility];  Service: Cardiovascular;  Laterality: N/A;  . CARDIAC CATHETERIZATION N/A 12/10/2016   Procedure: Coronary Balloon Angioplasty;  Surgeon: Jettie Booze, MD;  Location: [place] [facility];  Service: Cardiovascular;  Laterality: N/A;  . CARDIAC CATHETERIZATION N/A 12/10/2016   Procedure: Intravascular Pressure Wire/FFR Study;  Surgeon: Jettie Booze, MD;  Location: [facility] [facility];  Service: Cardiovascular;  Laterality: N/A;  . CORONARY STENT PLACEMENT  10/2008   x 2  . ELECTROPHYSIOLOGIC STUDY N/A 03/20/2015  no inducible SVT - Dr Lovena Le  . INGUINAL HERNIA REPAIR Right    RIH with ultrapro patch    reports that he quit smoking about 62 years ago. His smoking use included cigarettes. He has never used smokeless tobacco. He reports that he does not drink alcohol or use drugs. family history includes Bladder Cancer (age of onset: 92) in his mother; Healthy in his daughter and son; Heart attack (age of onset: 79) in his father; Pancreatic cancer (age of onset: 64) in his sister. Allergies  Allergen Reactions  . Crestor [Rosuvastatin Calcium] Rash    All over body  . Lovastatin Itching and Rash   Current Outpatient Medications on File Prior to Visit    Medication Sig Dispense Refill  . ACCU-CHEK AVIVA PLUS test strip USE FOR TESTING UP TO 4 TIMES DAILY AS DIRECTED 100 each 2  . amLODipine (NORVASC) 5 MG tablet TAKE 1 TABLET BY MOUTH EVERY DAY 90 tablet 3  . aspirin EC 81 MG tablet Take 81 mg by mouth daily after supper.    Marland Kitchen atorvastatin (LIPITOR) 10 MG tablet TAKE 1 TABLET (10 MG TOTAL) BY MOUTH DAILY AT 6 PM. 90 tablet 1  . blood glucose meter kit and supplies Dispense based on patient and insurance preference. Use up to four times daily as directed. (FOR ICD-10 E10.9, E11.9). 1 each 0  . Blood Glucose Monitoring Suppl (ONE TOUCH ULTRA 2) w/Device KIT Use as directed E 11.9 1 each 0  . brimonidine (ALPHAGAN) 0.2 % ophthalmic solution ADMINISTER 1 DROP TO BOTH EYES THREE (3) TIMES A DAY.  3  . carvedilol (COREG) 6.25 MG tablet TAKE 1 TABLET (6.25 MG TOTAL) BY MOUTH 2 (TWO) TIMES DAILY WITH A MEAL. 180 tablet 1  . fexofenadine (HM FEXOFENADINE HCL) 60 MG tablet Take 1 tablet (60 mg total) by mouth 2 (two) times daily as needed for allergies or rhinitis. 60 tablet 2  . glipiZIDE (GLUCOTROL XL) 5 MG 24 hr tablet Take 1 tablet (5 mg total) by mouth daily with breakfast. 90 tablet 3  . Lancets MISC Use as directed twice daily E11.9 200 each 3  . lansoprazole (PREVACID) 30 MG capsule TAKE 1 CAPSULE (30 MG TOTAL) BY MOUTH DAILY AT 12 NOON. 90 capsule 3  . latanoprost (XALATAN) 0.005 % ophthalmic solution Place 1 drop into both eyes at bedtime.  12  . losartan (COZAAR) 100 MG tablet TAKE 1 TABLET EVERY DAY 90 tablet 3  . nitroGLYCERIN (NITROSTAT) 0.4 MG SL tablet Place 1 tablet (0.4 mg total) under the tongue every 5 (five) minutes as needed for chest pain. 40 tablet 1  . oxybutynin (DITROPAN-XL) 5 MG 24 hr tablet Take 1 tablet (5 mg total) by mouth at bedtime. 90 tablet 3  . Probiotic Product (VSL#3 PO) Take by mouth.    . TRADJENTA 5 MG TABS tablet TAKE 1 TABLET BY MOUTH EVERY DAY 30 tablet 11   No current facility-administered medications on file  prior to visit.    Review of Systems  Constitutional: Negative for other unusual diaphoresis or sweats HENT: Negative for ear discharge or swelling Eyes: Negative for other worsening visual disturbances Respiratory: Negative for stridor or other swelling  Gastrointestinal: Negative for worsening distension or other blood Genitourinary: Negative for retention or other urinary change Musculoskeletal: Negative for other MSK pain or swelling Skin: Negative for color change or other new lesions Neurological: Negative for worsening tremors and other numbness  Psychiatric/Behavioral: Negative for worsening agitation or other fatigue  All other system neg per pt    Objective:   Physical Exam BP 124/64   Pulse 84   Temp 97.7 F (36.5 C) (Oral)   Ht 5' 9.5" (1.765 m)   Wt 157 lb (71.2 kg)   SpO2 90%   BMI 22.85 kg/m  VS noted,  Constitutional: Pt appears in NAD HENT: Head: NCAT.  Right Ear: External ear normal.  Left Ear: External ear normal.  Eyes: . Pupils are equal, round, and reactive to light. Conjunctivae and EOM are normal Nose: without d/c or deformity Neck: Neck supple. Gross normal ROM Cardiovascular: Normal rate and regular rhythm.   Pulmonary/Chest: Effort normal and breath sounds without rales or wheezing.  Abd:  Soft, NT, ND, + BS, no organomegaly Neurological: Pt is alert. At baseline orientation, motor grossly intact Skin: Skin is warm. No rashes, other new lesions, no LE edema Psychiatric: Pt behavior is normal without agitation  No other exam findings Lab Results  Component Value Date   WBC 7.6 05/19/2018   HGB 15.6 05/19/2018   HCT 46.1 05/19/2018   PLT 202.0 05/19/2018   GLUCOSE 222 (H) 11/25/2018   CHOL 127 11/25/2018   TRIG 140.0 11/25/2018   HDL 35.90 (L) 11/25/2018   LDLDIRECT 82.0 05/19/2018   LDLCALC 63 11/25/2018   ALT 17 11/25/2018   AST 14 11/25/2018   NA 137 11/25/2018   K 4.4 11/25/2018   CL 104 11/25/2018   CREATININE 1.03 11/25/2018    BUN 23 11/25/2018   CO2 26 11/25/2018   TSH 2.49 05/19/2018   PSA 2.49 12/21/2012   INR 1.15 12/10/2016   HGBA1C 9.4 (H) 11/25/2018   MICROALBUR 0.9 05/19/2018          Assessment & Plan:

## 2018-11-28 DIAGNOSIS — G4700 Insomnia, unspecified: Secondary | ICD-10-CM | POA: Insufficient documentation

## 2018-11-28 NOTE — Assessment & Plan Note (Signed)
Itch without current rash, for depomedrol IM 80

## 2018-11-28 NOTE — Assessment & Plan Note (Signed)
stable overall by history and exam, recent data reviewed with pt, and pt to continue medical treatment as before,  to f/u any worsening symptoms or concerns  

## 2018-11-28 NOTE — Assessment & Plan Note (Addendum)
stable overall by history and exam, recent data reviewed with pt, and pt to continue medical treatment as before,  to f/u any worsening symptoms or concerns  Note:  Total time for pt hx, exam, review of record with pt in the room, determination of diagnoses and plan for further eval and tx is > 40 min, with over 50% spent in coordination and counseling of patient including the differential dx, tx, further evaluation and other management of DM, HLD, HTN, hives/itching, cough with wt loss, insomnia

## 2018-11-28 NOTE — Assessment & Plan Note (Signed)
Unclear etiology, with recent wt loss, for cxr r/o malignancy

## 2018-11-28 NOTE — Assessment & Plan Note (Signed)
Ok for trazodone qhs prn,  to f/u any worsening symptoms or concerns  

## 2018-12-04 ENCOUNTER — Other Ambulatory Visit: Payer: Self-pay | Admitting: Internal Medicine

## 2018-12-23 ENCOUNTER — Ambulatory Visit (INDEPENDENT_AMBULATORY_CARE_PROVIDER_SITE_OTHER): Payer: Medicare HMO | Admitting: Internal Medicine

## 2018-12-23 ENCOUNTER — Encounter: Payer: Self-pay | Admitting: Internal Medicine

## 2018-12-23 VITALS — BP 116/66 | HR 72 | Temp 98.1°F | Ht 69.5 in | Wt 157.0 lb

## 2018-12-23 DIAGNOSIS — J310 Chronic rhinitis: Secondary | ICD-10-CM

## 2018-12-23 DIAGNOSIS — I10 Essential (primary) hypertension: Secondary | ICD-10-CM

## 2018-12-23 DIAGNOSIS — L500 Allergic urticaria: Secondary | ICD-10-CM | POA: Diagnosis not present

## 2018-12-23 DIAGNOSIS — H401333 Pigmentary glaucoma, bilateral, severe stage: Secondary | ICD-10-CM | POA: Diagnosis not present

## 2018-12-23 DIAGNOSIS — L509 Urticaria, unspecified: Secondary | ICD-10-CM

## 2018-12-23 DIAGNOSIS — H21233 Degeneration of iris (pigmentary), bilateral: Secondary | ICD-10-CM | POA: Diagnosis not present

## 2018-12-23 DIAGNOSIS — Z961 Presence of intraocular lens: Secondary | ICD-10-CM | POA: Diagnosis not present

## 2018-12-23 DIAGNOSIS — E1159 Type 2 diabetes mellitus with other circulatory complications: Secondary | ICD-10-CM

## 2018-12-23 MED ORDER — FAMOTIDINE 20 MG PO TABS: 20.0000 mg | ORAL_TABLET | Freq: Two times a day (BID) | ORAL | 11 refills | Status: DC

## 2018-12-23 MED ORDER — METHYLPREDNISOLONE ACETATE 80 MG/ML IJ SUSP
80.0000 mg | Freq: Once | INTRAMUSCULAR | Status: AC
  Administered 2018-12-23: 80 mg via INTRAMUSCULAR

## 2018-12-23 MED ORDER — FEXOFENADINE HCL 60 MG PO TABS: 60.0000 mg | ORAL_TABLET | Freq: Two times a day (BID) | ORAL | 11 refills | Status: DC | PRN

## 2018-12-23 MED ORDER — PREDNISONE 10 MG PO TABS: ORAL_TABLET | ORAL | 0 refills | Status: DC

## 2018-12-23 NOTE — Patient Instructions (Signed)
You had the steroid shot today  Please take all new medication as prescribed - the allegra, pepcid, and prednisone  Please continue all other medications as before, and refills have been done if requested.  Please have the pharmacy call with any other refills you may need.  Please continue your efforts at being more active, low cholesterol diet, and weight control.  Please keep your appointments with your specialists as you may have planned

## 2018-12-23 NOTE — Assessment & Plan Note (Signed)
stable overall by history and exam, recent data reviewed with pt, and pt to continue medical treatment as before,  to f/u any worsening symptoms or concerns  

## 2018-12-23 NOTE — Progress Notes (Signed)
Subjective:    Patient ID: Keith Herrera, male    DOB: July 13, 1932, 85 y.o.   MRN: [id number]  HPI  Here with c/o resurgence of hives with marked itching and erythema mostly to torso and the "bends" of the elbows and knees; no swelling of the lips, and  Pt denies chest pain, increased sob or doe, wheezing, orthopnea, PND, increased LE swelling, palpitations, dizziness or syncope.  Has hx of urticaria with negative environmental and food allergy testing jan 2019 with Dr Verlin Fester.  Not currently taking antihistamines.  He wants to know which of his meds might be doing this.  Pt denies new neurological symptoms such as new headache, or facial or extremity weakness or numbness   Pt denies polydipsia, polyuria Past Medical History:  Diagnosis Date  . Adenomatous polyp of colon 05/2004  . Allergic rhinitis   . Anxiety   . BPH (benign prostatic hyperplasia)   . CAD (coronary artery disease)    s/p Cypher DES to LAD and pD1 2010;  LHC was done 6/12: EF 65%, circumflex patent, mid RCA 80-90% (small and nondominant), LAD and diagonal stents patent, LAD at the origin of the first diagonal 30%, ostial D2 90%, mid 80-90% (small vessel).  Continued medical therapy was recommended.   . Carotid stenosis    dopplers 02/2012: 9-62% RICA; 22-97% LICA  . Diabetes type 2, controlled ([place])   . Diverticulosis    colon  . ED (erectile dysfunction)   . Esophageal stricture   . GERD (gastroesophageal reflux disease)   . Hemorrhoids   . Hiatal hernia   . Hyperlipidemia   . Hypertension   . IBS (irritable bowel syndrome)   . Right inguinal hernia    s/p repair in 8/12  . SVT (supraventricular tachycardia) ([facility])    Past Surgical History:  Procedure Laterality Date  . CARDIAC CATHETERIZATION N/A 12/10/2016   Procedure: Left Heart Cath and Coronary Angiography;  Surgeon: Jettie Booze, MD;  Location: [facility];  Service: Cardiovascular;  Laterality: N/A;  . CARDIAC CATHETERIZATION N/A 12/10/2016   Procedure: Coronary Balloon Angioplasty;  Surgeon: Jettie Booze, MD;  Location: [facility];  Service: Cardiovascular;  Laterality: N/A;  . CARDIAC CATHETERIZATION N/A 12/10/2016   Procedure: Intravascular Pressure Wire/FFR Study;  Surgeon: Jettie Booze, MD;  Location: [facility];  Service: Cardiovascular;  Laterality: N/A;  . CORONARY STENT PLACEMENT  10/2008   x 2  . ELECTROPHYSIOLOGIC STUDY N/A 03/20/2015   no inducible SVT - Dr Lovena Le  . INGUINAL HERNIA REPAIR Right    RIH with ultrapro patch    reports that he quit smoking about 62 years ago. His smoking use included cigarettes. He has never used smokeless tobacco. He reports that he does not drink alcohol or use drugs. family history includes Bladder Cancer (age of onset: 23) in his mother; Healthy in his daughter and son; Heart attack (age of onset: 59) in his father; Pancreatic cancer (age of onset: 63) in his sister. Allergies  Allergen Reactions  . Crestor [Rosuvastatin Calcium] Rash    All over body  . Lovastatin Itching and Rash   Current Outpatient Medications on File Prior to Visit  Medication Sig Dispense Refill  . ACCU-CHEK AVIVA PLUS test strip USE FOR TESTING UP TO 4 TIMES DAILY AS DIRECTED 100 each 2  . amLODipine (NORVASC) 5 MG tablet TAKE 1 TABLET BY MOUTH EVERY DAY 90 tablet 3  . aspirin EC 81 MG tablet  Take 81 mg by mouth daily after supper.    Marland Kitchen atorvastatin (LIPITOR) 10 MG tablet TAKE 1 TABLET (10 MG TOTAL) BY MOUTH DAILY AT 6 PM. 90 tablet 1  . blood glucose meter kit and supplies Dispense based on patient and insurance preference. Use up to four times daily as directed. (FOR ICD-10 E10.9, E11.9). 1 each 0  . Blood Glucose Monitoring Suppl (ONE TOUCH ULTRA 2) w/Device KIT Use as directed E 11.9 1 each 0  . brimonidine (ALPHAGAN) 0.2 % ophthalmic solution ADMINISTER 1 DROP TO BOTH EYES THREE (3) TIMES A DAY.  3  . carvedilol (COREG) 6.25 MG tablet TAKE 1 TABLET (6.25 MG TOTAL) BY MOUTH 2  (TWO) TIMES DAILY WITH A MEAL. 180 tablet 1  . fexofenadine (HM FEXOFENADINE HCL) 60 MG tablet Take 1 tablet (60 mg total) by mouth 2 (two) times daily as needed for allergies or rhinitis. 60 tablet 2  . glipiZIDE (GLUCOTROL XL) 5 MG 24 hr tablet TAKE 1 TABLET BY MOUTH EVERY DAY WITH BREAKFAST 90 tablet 1  . Lancets MISC Use as directed twice daily E11.9 200 each 3  . lansoprazole (PREVACID) 30 MG capsule TAKE 1 CAPSULE (30 MG TOTAL) BY MOUTH DAILY AT 12 NOON. 90 capsule 3  . latanoprost (XALATAN) 0.005 % ophthalmic solution Place 1 drop into both eyes at bedtime.  12  . losartan (COZAAR) 100 MG tablet TAKE 1 TABLET EVERY DAY 90 tablet 3  . metFORMIN (GLUCOPHAGE-XR) 500 MG 24 hr tablet Take 2 tablets (1,000 mg total) by mouth daily with breakfast. 180 tablet 3  . nitroGLYCERIN (NITROSTAT) 0.4 MG SL tablet Place 1 tablet (0.4 mg total) under the tongue every 5 (five) minutes as needed for chest pain. 40 tablet 1  . oxybutynin (DITROPAN-XL) 5 MG 24 hr tablet Take 1 tablet (5 mg total) by mouth at bedtime. 90 tablet 3  . Probiotic Product (VSL#3 PO) Take by mouth.    . TRADJENTA 5 MG TABS tablet TAKE 1 TABLET BY MOUTH EVERY DAY 30 tablet 11  . traZODone (DESYREL) 50 MG tablet Take 0.5-1 tablets (25-50 mg total) by mouth at bedtime as needed for sleep. 90 tablet 1   No current facility-administered medications on file prior to visit.    Review of Systems  Constitutional: Negative for other unusual diaphoresis or sweats HENT: Negative for ear discharge or swelling Eyes: Negative for other worsening visual disturbances Respiratory: Negative for stridor or other swelling  Gastrointestinal: Negative for worsening distension or other blood Genitourinary: Negative for retention or other urinary change Musculoskeletal: Negative for other MSK pain or swelling Skin: Negative for color change or other new lesions Neurological: Negative for worsening tremors and other numbness  Psychiatric/Behavioral:  Negative for worsening agitation or other fatigue All other system neg per pt    Objective:   Physical Exam BP 116/66   Pulse 72   Temp 98.1 F (36.7 C) (Oral)   Ht 5' 9.5" (1.765 m)   Wt 157 lb (71.2 kg)   SpO2 96%   BMI 22.85 kg/m  VS noted,  Constitutional: Pt appears in NAD HENT: Head: NCAT.  Right Ear: External ear normal.  Left Ear: External ear normal.  Eyes: . Pupils are equal, round, and reactive to light. Conjunctivae and EOM are normal Nose: without d/c or deformity Neck: Neck supple. Gross normal ROM Cardiovascular: Normal rate and regular rhythm.   Pulmonary/Chest: Effort normal and breath sounds without rales or wheezing.  Abd:  Soft, NT, ND, +  BS, no organomegaly Neurological: Pt is alert. At baseline orientation, motor grossly intact Skin: Skin is warm. no LE edema but has diffuse  nontender plaquelike erythema to mutliple areas of the torso, elbows and knee areas. Psychiatric: Pt behavior is normal without agitation  No other exam findings  Lab Results  Component Value Date   WBC 7.6 05/19/2018   HGB 15.6 05/19/2018   HCT 46.1 05/19/2018   PLT 202.0 05/19/2018   GLUCOSE 222 (H) 11/25/2018   CHOL 127 11/25/2018   TRIG 140.0 11/25/2018   HDL 35.90 (L) 11/25/2018   LDLDIRECT 82.0 05/19/2018   LDLCALC 63 11/25/2018   ALT 17 11/25/2018   AST 14 11/25/2018   NA 137 11/25/2018   K 4.4 11/25/2018   CL 104 11/25/2018   CREATININE 1.03 11/25/2018   BUN 23 11/25/2018   CO2 26 11/25/2018   TSH 2.49 05/19/2018   PSA 2.49 12/21/2012   INR 1.15 12/10/2016   HGBA1C 9.4 (H) 11/25/2018   MICROALBUR 0.9 05/19/2018       Assessment & Plan:

## 2018-12-23 NOTE — Assessment & Plan Note (Signed)
Doubt med related, to cont meds as is, for depomedrol 80 IM, predpac asd, restart allegra and add pepcid (not zantac as before)

## 2018-12-23 NOTE — Addendum Note (Signed)
Addended by: Juliet Rude on: 12/23/2018 08:41 AM   Modules accepted: Orders

## 2019-01-06 ENCOUNTER — Other Ambulatory Visit: Payer: Self-pay | Admitting: Internal Medicine

## 2019-01-14 ENCOUNTER — Other Ambulatory Visit: Payer: Self-pay | Admitting: Allergy and Immunology

## 2019-01-14 DIAGNOSIS — L500 Allergic urticaria: Secondary | ICD-10-CM

## 2019-02-19 ENCOUNTER — Other Ambulatory Visit: Payer: Self-pay | Admitting: Internal Medicine

## 2019-04-16 ENCOUNTER — Encounter: Payer: Self-pay | Admitting: Internal Medicine

## 2019-04-16 ENCOUNTER — Other Ambulatory Visit: Payer: Self-pay

## 2019-04-16 ENCOUNTER — Ambulatory Visit: Payer: Medicare HMO | Admitting: Internal Medicine

## 2019-04-16 ENCOUNTER — Telehealth: Payer: Self-pay | Admitting: Cardiovascular Disease

## 2019-04-16 ENCOUNTER — Other Ambulatory Visit: Payer: Self-pay | Admitting: Cardiovascular Disease

## 2019-04-16 VITALS — BP 146/72 | HR 61 | Ht 69.5 in | Wt 160.0 lb

## 2019-04-16 DIAGNOSIS — I2510 Atherosclerotic heart disease of native coronary artery without angina pectoris: Secondary | ICD-10-CM | POA: Diagnosis not present

## 2019-04-16 DIAGNOSIS — R079 Chest pain, unspecified: Secondary | ICD-10-CM

## 2019-04-16 DIAGNOSIS — I25119 Atherosclerotic heart disease of native coronary artery with unspecified angina pectoris: Secondary | ICD-10-CM | POA: Diagnosis not present

## 2019-04-16 DIAGNOSIS — I10 Essential (primary) hypertension: Secondary | ICD-10-CM

## 2019-04-16 DIAGNOSIS — R0789 Other chest pain: Secondary | ICD-10-CM

## 2019-04-16 NOTE — Progress Notes (Signed)
Cardiology Office Note Date:  04/16/2019   ID:  Keith Herrera, DOB 1931-11-26, MRN [id number]  PCP:  [name], MD  Cardiologist:  Dr Burt Knack   CC: chest pain   History of Present Illness: Keith Herrera is a 85 y.o. male who presents for follow-up of CAD. He underwent LAD and diagonal stenting in 2010, then underwent diagonal angioplasty in 2018.   Pt seen today as add on due to chest pressure. Pt states he has been having epigastric/left sided chest pain for the past 3-4 weeks. It is worse after eating. It is not related to activity. He is able to walk 20-30 minutes on a TM most days, and specifically denies exertional chest pain, SOB, or diaphoresis. He states he had been thinking too much about it which is why he wanted to be seen today. Last episode was 2 days ago, and it happens 1-2 times weekly for 1-2 minutes at most. He has "gas" with burping that does sometimes relieve the pain. He denies orthopnea, PND, palpitations, lightheadedness or syncope. He has not taken NTG. States he has taken "maybe 2" in the past 6 months. He has intermittent left arm paraesthesias that are not related to his chest pressure, and are worse when lying on his left side.   Past Medical History:  Diagnosis Date  . Adenomatous polyp of colon 05/2004  . Allergic rhinitis   . Anxiety   . BPH (benign prostatic hyperplasia)   . CAD (coronary artery disease)    s/p Cypher DES to LAD and pD1 2010;  LHC was done 6/12: EF 65%, circumflex patent, mid RCA 80-90% (small and nondominant), LAD and diagonal stents patent, LAD at the origin of the first diagonal 30%, ostial D2 90%, mid 80-90% (small vessel).  Continued medical therapy was recommended.   . Carotid stenosis    dopplers 02/2012: 2-77% RICA; 82-42% LICA  . Diabetes type 2, controlled ([place])   . Diverticulosis    colon  . ED (erectile dysfunction)   . Esophageal stricture   . GERD (gastroesophageal reflux disease)   . Hemorrhoids   . Hiatal hernia    . Hyperlipidemia   . Hypertension   . IBS (irritable bowel syndrome)   . Right inguinal hernia    s/p repair in 8/12  . SVT (supraventricular tachycardia) (HCC)    Past Surgical History:  Procedure Laterality Date  . CARDIAC CATHETERIZATION N/A 12/10/2016   Procedure: Left Heart Cath and Coronary Angiography;  Surgeon: Jettie Booze, MD;  Location: [facility];  Service: Cardiovascular;  Laterality: N/A;  . CARDIAC CATHETERIZATION N/A 12/10/2016   Procedure: Coronary Balloon Angioplasty;  Surgeon: Jettie Booze, MD;  Location: [facility];  Service: Cardiovascular;  Laterality: N/A;  . CARDIAC CATHETERIZATION N/A 12/10/2016   Procedure: Intravascular Pressure Wire/FFR Study;  Surgeon: Jettie Booze, MD;  Location: [place] [facility];  Service: Cardiovascular;  Laterality: N/A;  . CORONARY STENT PLACEMENT  10/2008   x 2  . ELECTROPHYSIOLOGIC STUDY N/A 03/20/2015   no inducible SVT - Dr Lovena Le  . INGUINAL HERNIA REPAIR Right    RIH with ultrapro patch    Current Outpatient Medications  Medication Sig Dispense Refill  . ACCU-CHEK AVIVA PLUS test strip USE FOR TESTING UP TO 4 TIMES DAILY AS DIRECTED 100 each 2  . amLODipine (NORVASC) 5 MG tablet Take 1 tablet (5 mg total) by mouth daily. Please make yearly appt with Dr. Burt Knack for August  for future refills. 1st attempt 90 tablet 0  . aspirin EC 81 MG tablet Take 81 mg by mouth daily after supper.    Marland Kitchen atorvastatin (LIPITOR) 10 MG tablet TAKE 1 TABLET (10 MG TOTAL) BY MOUTH DAILY AT 6 PM. 90 tablet 1  . blood glucose meter kit and supplies Dispense based on patient and insurance preference. Use up to four times daily as directed. (FOR ICD-10 E10.9, E11.9). 1 each 0  . Blood Glucose Monitoring Suppl (ONE TOUCH ULTRA 2) w/Device KIT Use as directed E 11.9 1 each 0  . brimonidine (ALPHAGAN) 0.2 % ophthalmic solution ADMINISTER 1 DROP TO BOTH EYES THREE (3) TIMES A DAY.  3  . carvedilol (COREG) 6.25 MG tablet TAKE  1 TABLET (6.25 MG TOTAL) BY MOUTH 2 (TWO) TIMES DAILY WITH A MEAL. 180 tablet 0  . famotidine (PEPCID) 20 MG tablet Take 1 tablet (20 mg total) by mouth 2 (two) times daily. 60 tablet 11  . fexofenadine (HM FEXOFENADINE HCL) 60 MG tablet Take 1 tablet (60 mg total) by mouth 2 (two) times daily as needed for allergies or rhinitis. 60 tablet 11  . glipiZIDE (GLUCOTROL XL) 5 MG 24 hr tablet TAKE 1 TABLET BY MOUTH EVERY DAY WITH BREAKFAST 90 tablet 1  . Lancets MISC Use as directed twice daily E11.9 200 each 3  . lansoprazole (PREVACID) 30 MG capsule TAKE 1 CAPSULE (30 MG TOTAL) BY MOUTH DAILY AT 12 NOON. 90 capsule 3  . latanoprost (XALATAN) 0.005 % ophthalmic solution Place 1 drop into both eyes at bedtime.  12  . losartan (COZAAR) 100 MG tablet TAKE 1 TABLET EVERY DAY 90 tablet 3  . metFORMIN (GLUCOPHAGE-XR) 500 MG 24 hr tablet Take 2 tablets (1,000 mg total) by mouth daily with breakfast. 180 tablet 3  . nitroGLYCERIN (NITROSTAT) 0.4 MG SL tablet Place 1 tablet (0.4 mg total) under the tongue every 5 (five) minutes as needed for chest pain. 40 tablet 1  . oxybutynin (DITROPAN-XL) 5 MG 24 hr tablet Take 1 tablet (5 mg total) by mouth at bedtime. 90 tablet 3  . predniSONE (DELTASONE) 10 MG tablet 3 tabs by mouth per day for 3 days,2tabs per day for 3 days,1tab per day for 3 days 18 tablet 0  . Probiotic Product (VSL#3 PO) Take by mouth.    . TRADJENTA 5 MG TABS tablet TAKE 1 TABLET BY MOUTH EVERY DAY 30 tablet 11  . traZODone (DESYREL) 50 MG tablet Take 0.5-1 tablets (25-50 mg total) by mouth at bedtime as needed for sleep. 90 tablet 1   No current facility-administered medications for this visit.    Allergies:   Crestor [rosuvastatin calcium] and Lovastatin   Social History:  The patient  reports that he quit smoking about 63 years ago. His smoking use included cigarettes. He has never used smokeless tobacco. He reports that he does not drink alcohol or use drugs.   Family History:  The  patient's family history includes Bladder Cancer (age of onset: 37) in his mother; Healthy in his daughter and son; Heart attack (age of onset: 26) in his father; Pancreatic cancer (age of onset: 59) in his sister.   ROS:  Review of systems complete and found to be negative unless listed in HPI.    PHYSICAL EXAM: Vitals:   04/16/19 1438  BP: (!) 146/72  Pulse: 61  Weight: 160 lb (72.6 kg)  Height: 5' 9.5" (1.765 m)   GEN- The patient is well appearing, alert and  oriented x 3 today.   Head- normocephalic, atraumatic Eyes-  Sclera clear, conjunctiva pink Ears- hearing intact Oropharynx- clear Neck- supple,  Lungs- Clear to ausculation bilaterally, normal work of breathing Chest- pacemaker pocket is without hematoma/ bruit Heart- Regular rate and rhythm, no murmurs, rubs or gallops, PMI not laterally displaced GI- soft, NT, ND, + BS Extremities- no clubbing or cyanosis. Trace ankle edema Neuro- strength and sensation are intact  EKG:  EKG is ordered today which shows Sinus rhythm with occasional PVCs. Inferior T wave changes are stable.  Personally reviewed.   Recent Labs: 05/19/2018: Hemoglobin 15.6; Platelets 202.0; TSH 2.49 11/25/2018: ALT 17; BUN 23; Creatinine, Ser 1.03; Potassium 4.4; Sodium 137   Lipid Panel     Component Value Date/Time   CHOL 127 11/25/2018 1045   TRIG 140.0 11/25/2018 1045   TRIG 128 11/19/2006 1032   HDL 35.90 (L) 11/25/2018 1045   CHOLHDL 4 11/25/2018 1045   VLDL 28.0 11/25/2018 1045   LDLCALC 63 11/25/2018 1045   LDLDIRECT 82.0 05/19/2018 1058      Wt Readings from Last 3 Encounters:  12/23/18 157 lb (71.2 kg)  11/25/18 157 lb (71.2 kg)  07/10/18 163 lb 3.2 oz (74 kg)     Cardiac Studies reviewed: Echo 12-09-2016: Study Conclusions  - Left ventricle: The cavity size was normal. Systolic function was   vigorous. The estimated ejection fraction was in the range of 65%   to 70%. Wall motion was normal; there were no regional wall   motion  abnormalities. Doppler parameters are consistent with   abnormal left ventricular relaxation (grade 1 diastolic   dysfunction). Doppler parameters are consistent with elevated   ventricular end-diastolic filling pressure. - Aortic valve: Trileaflet; normal thickness leaflets. There was no   regurgitation. - Aortic root: The aortic root was normal in size. - Mitral valve: Calcified annulus. Mildly thickened leaflets .   Transvalvular velocity was within the normal range. There was no   evidence for stenosis. There was no regurgitation. - Left atrium: The atrium was normal in size. - Right ventricle: Systolic function was normal. - Right atrium: The atrium was normal in size. - Tricuspid valve: There was mild regurgitation. - Pulmonary arteries: Systolic pressure was within the normal   range. - Inferior vena cava: The vessel was normal in size. - Pericardium, extracardiac: There was no pericardial effusion.  Cardiac Cath 12-10-2016: Conclusion     Non-dominant vessel, Mid RCA lesion, 100 %stenosed. This has progressed since 2012. There are right to right and left to right collaterals.  Patent stent in the first Diag.  Patent proximal LAD stent.  Ost 2nd Diag to mid 2nd Diag lesion, 75 %stenosed. Stable disease since 2012.  1st Diag lesion, 80 %stenosed. This lesion was past the prior stent. After PTCA with a 2.0 balloon, a stent could not be delivered due to the prior LAD and diagional stents.  Post intervention with PTCA, there is a 10% residual stenosis.  LV end diastolic pressure is normal.  There is no aortic valve stenosis.  50% mid LAD lesion which was assessed by FFR. Resting FFR was 0.89 and after adenosine, FFR dropped to 0.82.   Continue dual antiplatelet therapy for at least a month. No stent was actually placed. The stent could not be delivered due to the prior LAD and diagonal stents. There is an excellent angioplasty result.  FFR of the mid LAD was negative.  Continue aggressive medical therapy. Can consider continuing clopidogrel long-term.  ASSESSMENT AND PLAN: 1.  CAD, native vessel, without angina: Reviewed most recent cath study. Would continue medical therapy. Chest pain symptoms suggestive of GI symptoms. Had initially improved with treatment. He has no exertional CP or pressure and is able to walk on a TM for 30 minutes most days.   2.  Hypertension: Stable on current regimen.   3.  Hyperlipidemia: treated with atorvastatin. No change.   4.  Type 2 diabetes: Followed by PCP. Treated with oral hypoglycemics  5.  Heart palpitations: PVCs noted on his EKG today. Previously seen by Dr. Lovena Le with limited treatment options in the setting of his coronary artery disease.  Observation recommended. He denies symptoms today.   Current medications reviewed with the patient. The patient does NOT have concerns regarding medications.   Labs/ tests ordered today include:  No orders of the defined types were placed in this encounter.   Disposition: He should keep yearly visit with Dr. Burt Knack (Due in August). Pt knows to call back or report to ER with any worsening chest pain, especially exertional.   Thompson Grayer MD, [place] 04/16/2019 4:27 PM

## 2019-04-16 NOTE — Telephone Encounter (Signed)
Pt c/o of Chest Pain: STAT if CP now or developed within 24 hours  1. Are you having CP right now? No- not at the moment-  Its a discomfort in his chest  2. Are you experiencing any other symptoms (ex. SOB, nausea, vomiting, sweating)?bness in his left arm and in tingling left hand and leg  3. How long have you been experiencing CP? A week or so  4. Is your CP continuous or coming and going? Come and goes  5. Have you taken Nitroglycerin?  Yes- pt wants to be seen ?

## 2019-04-16 NOTE — Telephone Encounter (Signed)
Patient complaining of chest tightness, tingling left hand and left leg that comes and goes. Happens usually when he is sitting down. Patient did use nitroglycerin one night when he had chest tightness and it went away. Patient denies having any chest pain or SOB at this time. Patient is due to see Dr. Burt Knack for a year follow-up in August. Patient stated he is afraid to go to the hospital. Informed patient that [facility] is using extreme cautions in the ER to keep patient's safe from [place], and they are testing patients that go in to keep the spread of the virus down. Informed patient that I would reach out to our office to see if anyone is available to see him today. Informed patient that we would call him back.   FLEX suggest that message be sent to [facility] PA to call patient and talk to patient about his symptoms to see if he needs to be seen or sent to ED.

## 2019-04-16 NOTE — Patient Instructions (Addendum)
Medication Instructions:  Your physician recommends that you continue on your current medications as directed. Please refer to the Current Medication list given to you today.  Labwork: None ordered.  Testing/Procedures: None ordered.  Follow-Up: Your physician wants you to follow-up with Dr. Burt Knack.    I will send a message to Dr. Antionette Char scheduler.  If you have worsening chest pain go to the ER.  Any Other Special Instructions Will Be Listed Below (If Applicable).  If you need a refill on your cardiac medications before your next appointment, please call your pharmacy.

## 2019-04-16 NOTE — Telephone Encounter (Signed)
Patient see PA today at 2:30 pm.   Informed patient to wear mask in the office.      COVID-19 Pre-Screening Questions:  . In the past 7 to 10 days have you had a cough,  shortness of breath, headache, congestion, fever (100 or greater) body aches, chills, sore throat, or sudden loss of taste or sense of smell? No . Have you been around anyone with known Covid 19. no . Have you been around anyone who is awaiting Covid 19 test results in the past 7 to 10 days?  no . Have you been around anyone who has been exposed to Covid 19, or has mentioned symptoms of Covid 19 within the past 7 to 10 days? no  If you have any concerns/questions about symptoms patients report during screening (either on the phone or at threshold). Contact the provider seeing the patient or DOD for further guidance.  If neither are available contact a member of the leadership team.

## 2019-04-19 ENCOUNTER — Telehealth: Payer: Self-pay | Admitting: *Deleted

## 2019-04-19 MED ORDER — LOSARTAN POTASSIUM 50 MG PO TABS: 50.0000 mg | ORAL_TABLET | Freq: Two times a day (BID) | ORAL | 3 refills | Status: DC

## 2019-04-19 NOTE — Telephone Encounter (Signed)
Done erx - 50 bid losartan

## 2019-04-19 NOTE — Telephone Encounter (Signed)
Losartan 100 mg is on back order. Patient is out. He is requesting 50 mg. Please advise.

## 2019-04-20 ENCOUNTER — Other Ambulatory Visit: Payer: Self-pay | Admitting: Internal Medicine

## 2019-04-20 NOTE — Telephone Encounter (Signed)
Pt informed of below.  

## 2019-04-23 ENCOUNTER — Encounter: Payer: Self-pay | Admitting: Internal Medicine

## 2019-04-23 ENCOUNTER — Telehealth: Payer: Self-pay | Admitting: Internal Medicine

## 2019-04-23 ENCOUNTER — Ambulatory Visit (INDEPENDENT_AMBULATORY_CARE_PROVIDER_SITE_OTHER): Payer: Medicare HMO | Admitting: Internal Medicine

## 2019-04-23 ENCOUNTER — Other Ambulatory Visit: Payer: Self-pay

## 2019-04-23 VITALS — BP 116/68 | HR 58 | Temp 97.9°F | Ht 69.5 in | Wt 156.0 lb

## 2019-04-23 DIAGNOSIS — G4700 Insomnia, unspecified: Secondary | ICD-10-CM | POA: Diagnosis not present

## 2019-04-23 DIAGNOSIS — R202 Paresthesia of skin: Secondary | ICD-10-CM | POA: Diagnosis not present

## 2019-04-23 DIAGNOSIS — R1013 Epigastric pain: Secondary | ICD-10-CM | POA: Diagnosis not present

## 2019-04-23 DIAGNOSIS — L299 Pruritus, unspecified: Secondary | ICD-10-CM

## 2019-04-23 MED ORDER — LOSARTAN POTASSIUM 25 MG PO TABS: 25.0000 mg | ORAL_TABLET | Freq: Two times a day (BID) | ORAL | 3 refills | Status: DC

## 2019-04-23 MED ORDER — PANTOPRAZOLE SODIUM 40 MG PO TBEC: 40.0000 mg | DELAYED_RELEASE_TABLET | Freq: Every day | ORAL | 3 refills | Status: DC

## 2019-04-23 MED ORDER — GABAPENTIN 300 MG PO CAPS: ORAL_CAPSULE | ORAL | 1 refills | Status: DC

## 2019-04-23 MED ORDER — TELMISARTAN 40 MG PO TABS: 40.0000 mg | ORAL_TABLET | Freq: Every day | ORAL | 3 refills | Status: DC

## 2019-04-23 MED ORDER — FLUOCINONIDE-E 0.05 % EX CREA: 1.0000 "application " | TOPICAL_CREAM | Freq: Two times a day (BID) | CUTANEOUS | 1 refills | Status: DC | PRN

## 2019-04-23 MED ORDER — FAMOTIDINE 20 MG PO TABS: 20.0000 mg | ORAL_TABLET | Freq: Two times a day (BID) | ORAL | 3 refills | Status: DC

## 2019-04-23 MED ORDER — TRIAMCINOLONE ACETONIDE 0.1 % EX CREA: 1.0000 "application " | TOPICAL_CREAM | Freq: Two times a day (BID) | CUTANEOUS | 1 refills | Status: DC

## 2019-04-23 MED ORDER — LOSARTAN POTASSIUM 50 MG PO TABS: 50.0000 mg | ORAL_TABLET | Freq: Two times a day (BID) | ORAL | 3 refills | Status: DC

## 2019-04-23 NOTE — Telephone Encounter (Signed)
Called pt, LVM.   

## 2019-04-23 NOTE — Telephone Encounter (Signed)
Copied from [place] [id number]. Topic: General - Other >> Apr 23, 2019  1:50 PM Leward Quan A wrote: Reason for CRM: Patient called to say that Pharmacy has received Rx for fluocinonide-emollient (LIDEX-E) 0.05 % cream, and losartan (COZAAR) 50 MG tablet is also not available asking for something else please. Asking can Rx be sent to a different pharmacy [facility] or [place] please. Ph# [phone]

## 2019-04-23 NOTE — Telephone Encounter (Signed)
Pt has been informed.

## 2019-04-23 NOTE — Telephone Encounter (Signed)
Ok for chagne losartan to micardi 40 qd

## 2019-04-23 NOTE — Progress Notes (Signed)
Subjective:    Patient ID: Keith Herrera, male    DOB: June 19, 1932, 85 y.o.   MRN: [id number]  HPI  Here to f/u with c/o indigestion with burning pain, belching, and occasional dizzy when really acts up, overall worse for 2-3 mo despite his daily prevacid, and not taking the pepcid for unclear reasons.  Denies worsening dysphagia, vomiting, other bowel change or blood.  Pt denies chest pain, increased sob or doe, wheezing, orthopnea, PND, increased LE swelling, palpitations, dizziness or syncope.  Pt denies new neurological symptoms such as new headache, or facial or extremity weakness or numbness   Pt denies polydipsia, polyuria, or low sugar symptoms.   CBGs in the AM can be up to 170, takes meds, then 100-120 in the PM,  Also cannot sleep well at night, trazodone just not working well, Denies worsening depressive symptoms, suicidal ideation, or panic.  Also left hand goes to sleep when lay on left side, mild, declines evaluation or tx, and no real pain or weakness.  Also c/o persistently recurring Itching left shoulder and back areas, very annoying but no rash , swelling or neuritic type pain.   Past Medical History:  Diagnosis Date  . Adenomatous polyp of colon 05/2004  . Allergic rhinitis   . Anxiety   . BPH (benign prostatic hyperplasia)   . CAD (coronary artery disease)    s/p Cypher DES to LAD and pD1 2010;  LHC was done 6/12: EF 65%, circumflex patent, mid RCA 80-90% (small and nondominant), LAD and diagonal stents patent, LAD at the origin of the first diagonal 30%, ostial D2 90%, mid 80-90% (small vessel).  Continued medical therapy was recommended.   . Carotid stenosis    dopplers 02/2012: 5-36% RICA; 14-43% LICA  . Diabetes type 2, controlled ([place])   . Diverticulosis    colon  . ED (erectile dysfunction)   . Esophageal stricture   . GERD (gastroesophageal reflux disease)   . Hemorrhoids   . Hiatal hernia   . Hyperlipidemia   . Hypertension   . IBS (irritable bowel syndrome)    . Right inguinal hernia    s/p repair in 8/12  . SVT (supraventricular tachycardia) (HCC)    Past Surgical History:  Procedure Laterality Date  . CARDIAC CATHETERIZATION N/A 12/10/2016   Procedure: Left Heart Cath and Coronary Angiography;  Surgeon: Jettie Booze, MD;  Location: [place] [facility];  Service: Cardiovascular;  Laterality: N/A;  . CARDIAC CATHETERIZATION N/A 12/10/2016   Procedure: Coronary Balloon Angioplasty;  Surgeon: Jettie Booze, MD;  Location: [place];  Service: Cardiovascular;  Laterality: N/A;  . CARDIAC CATHETERIZATION N/A 12/10/2016   Procedure: Intravascular Pressure Wire/FFR Study;  Surgeon: Jettie Booze, MD;  Location: [place] [facility];  Service: Cardiovascular;  Laterality: N/A;  . CORONARY STENT PLACEMENT  10/2008   x 2  . ELECTROPHYSIOLOGIC STUDY N/A 03/20/2015   no inducible SVT - Dr Lovena Le  . INGUINAL HERNIA REPAIR Right    RIH with ultrapro patch    reports that he quit smoking about 63 years ago. His smoking use included cigarettes. He has never used smokeless tobacco. He reports that he does not drink alcohol or use drugs. family history includes Bladder Cancer (age of onset: 74) in his mother; Healthy in his daughter and son; Heart attack (age of onset: 18) in his father; Pancreatic cancer (age of onset: 75) in his sister. Allergies  Allergen Reactions  . Crestor [Rosuvastatin Calcium] Rash  All over body  . Lovastatin Itching and Rash   Current Outpatient Medications on File Prior to Visit  Medication Sig Dispense Refill  . ACCU-CHEK AVIVA PLUS test strip USE FOR TESTING UP TO 4 TIMES DAILY AS DIRECTED 100 each 2  . amLODipine (NORVASC) 5 MG tablet Take 1 tablet (5 mg total) by mouth daily. Please make yearly appt with Dr. Burt Knack for August for future refills. 1st attempt 90 tablet 0  . aspirin EC 81 MG tablet Take 81 mg by mouth daily after supper.    Marland Kitchen atorvastatin (LIPITOR) 10 MG tablet TAKE 1 TABLET (10 MG TOTAL)  BY MOUTH DAILY AT 6 PM. 90 tablet 1  . blood glucose meter kit and supplies Dispense based on patient and insurance preference. Use up to four times daily as directed. (FOR ICD-10 E10.9, E11.9). 1 each 0  . Blood Glucose Monitoring Suppl (ONE TOUCH ULTRA 2) w/Device KIT Use as directed E 11.9 1 each 0  . brimonidine (ALPHAGAN) 0.2 % ophthalmic solution ADMINISTER 1 DROP TO BOTH EYES THREE (3) TIMES A DAY.  3  . carvedilol (COREG) 6.25 MG tablet TAKE 1 TABLET (6.25 MG TOTAL) BY MOUTH 2 (TWO) TIMES DAILY WITH A MEAL. 180 tablet 0  . fexofenadine (HM FEXOFENADINE HCL) 60 MG tablet Take 1 tablet (60 mg total) by mouth 2 (two) times daily as needed for allergies or rhinitis. 60 tablet 11  . glipiZIDE (GLUCOTROL XL) 5 MG 24 hr tablet TAKE 1 TABLET BY MOUTH EVERY DAY WITH BREAKFAST 90 tablet 1  . Lancets MISC Use as directed twice daily E11.9 200 each 3  . latanoprost (XALATAN) 0.005 % ophthalmic solution Place 1 drop into both eyes at bedtime.  12  . meclizine (ANTIVERT) 12.5 MG tablet TAKE 1 TABLET (12.5 MG TOTAL) BY MOUTH 3 (THREE) TIMES DAILY AS NEEDED FOR DIZZINESS. 30 tablet 2  . metFORMIN (GLUCOPHAGE-XR) 500 MG 24 hr tablet Take 2 tablets (1,000 mg total) by mouth daily with breakfast. 180 tablet 3  . nitroGLYCERIN (NITROSTAT) 0.4 MG SL tablet Place 1 tablet (0.4 mg total) under the tongue every 5 (five) minutes as needed for chest pain. 40 tablet 1  . Probiotic Product (VSL#3 PO) Take by mouth.    . TRADJENTA 5 MG TABS tablet TAKE 1 TABLET BY MOUTH EVERY DAY 30 tablet 11  . traZODone (DESYREL) 50 MG tablet Take 0.5-1 tablets (25-50 mg total) by mouth at bedtime as needed for sleep. 90 tablet 1   No current facility-administered medications on file prior to visit.    Review of Systems  Constitutional: Negative for other unusual diaphoresis or sweats HENT: Negative for ear discharge or swelling Eyes: Negative for other worsening visual disturbances Respiratory: Negative for stridor or other  swelling  Gastrointestinal: Negative for worsening distension or other blood Genitourinary: Negative for retention or other urinary change Musculoskeletal: Negative for other MSK pain or swelling Skin: Negative for color change or other new lesions Neurological: Negative for worsening tremors and other numbness  Psychiatric/Behavioral: Negative for worsening agitation or other fatigue All other system neg per pt    Objective:   Physical Exam BP 116/68   Pulse (!) 58   Temp 97.9 F (36.6 C) (Oral)   Ht 5' 9.5" (1.765 m)   Wt 156 lb (70.8 kg)   SpO2 96%   BMI 22.71 kg/m  VS noted,  Constitutional: Pt appears in NAD HENT: Head: NCAT.  Right Ear: External ear normal.  Left Ear: External ear  normal.  Eyes: . Pupils are equal, round, and reactive to light. Conjunctivae and EOM are normal Nose: without d/c or deformity Neck: Neck supple. Gross normal ROM Cardiovascular: Normal rate and regular rhythm.   Pulmonary/Chest: Effort normal and breath sounds without rales or wheezing.  Abd:  Soft,  ND, + BS, no organomegaly with mild tender epigastric without guarding or rebound Neurological: Pt is alert. At baseline orientation, motor grossly intact Skin: Skin is warm. No rashes, other new lesions, no LE edema Psychiatric: Pt behavior is normal without agitation  No other exam findings  Lab Results  Component Value Date   WBC 7.6 05/19/2018   HGB 15.6 05/19/2018   HCT 46.1 05/19/2018   PLT 202.0 05/19/2018   GLUCOSE 222 (H) 11/25/2018   CHOL 127 11/25/2018   TRIG 140.0 11/25/2018   HDL 35.90 (L) 11/25/2018   LDLDIRECT 82.0 05/19/2018   LDLCALC 63 11/25/2018   ALT 17 11/25/2018   AST 14 11/25/2018   NA 137 11/25/2018   K 4.4 11/25/2018   CL 104 11/25/2018   CREATININE 1.03 11/25/2018   BUN 23 11/25/2018   CO2 26 11/25/2018   TSH 2.49 05/19/2018   PSA 2.49 12/21/2012   INR 1.15 12/10/2016   HGBA1C 9.4 (H) 11/25/2018   MICROALBUR 0.9 05/19/2018       Assessment & Plan:

## 2019-04-23 NOTE — Telephone Encounter (Signed)
Prichard for change to kenalog cr prn, and losartan 25 bid

## 2019-04-23 NOTE — Patient Instructions (Signed)
OK to stop the prevacid  Please take all new medication as prescribed - the protonix, and the pepcid twice per day   OK to stop the trazodone  Please take all new medication as prescribed - the gabapentin as needed  Please take all new medication as prescribed - the cream for the back and shoulder  Please continue all other medications as before, and refills have been done if requested.  Please have the pharmacy call with any other refills you may need.  Please continue your efforts at being more active, low cholesterol diet, and weight control  Please keep your appointments with your specialists as you may have planned

## 2019-04-25 ENCOUNTER — Encounter: Payer: Self-pay | Admitting: Internal Medicine

## 2019-04-25 DIAGNOSIS — R202 Paresthesia of skin: Secondary | ICD-10-CM | POA: Insufficient documentation

## 2019-04-25 DIAGNOSIS — R1013 Epigastric pain: Secondary | ICD-10-CM | POA: Insufficient documentation

## 2019-04-25 NOTE — Assessment & Plan Note (Addendum)
Mild to mod, without red flags it seems, to change the prevacid to protonix and restart the pepcid bid,  to f/u any worsening symptoms or concerns, consider GI referral if not improved to consider egd  Note:  Total time for pt hx, exam, review of record with pt in the room, determination of diagnoses and plan for further eval and tx is > 40 min, with over 50% spent in coordination and counseling of patient including the differential dx, tx, further evaluation and other management of dyspepsia, insomnia, paresthesias left arm, and pruritis

## 2019-04-25 NOTE — Assessment & Plan Note (Signed)
D/c trazodone 50 qhs prn as is not effective, ok for gabapentin 300 qhs hoping to help with sleep and possibly the itching ? neuritic

## 2019-04-25 NOTE — Assessment & Plan Note (Signed)
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

## 2019-04-25 NOTE — Assessment & Plan Note (Signed)
Exam benign, c/w derm vs neuritic, for topical steroid asd prn, also hoping the gabapentin may assist

## 2019-05-11 DIAGNOSIS — R351 Nocturia: Secondary | ICD-10-CM | POA: Diagnosis not present

## 2019-05-11 DIAGNOSIS — N401 Enlarged prostate with lower urinary tract symptoms: Secondary | ICD-10-CM | POA: Diagnosis not present

## 2019-05-20 ENCOUNTER — Other Ambulatory Visit: Payer: Self-pay | Admitting: Internal Medicine

## 2019-05-24 ENCOUNTER — Other Ambulatory Visit: Payer: Self-pay | Admitting: Internal Medicine

## 2019-05-26 ENCOUNTER — Other Ambulatory Visit: Payer: Self-pay | Admitting: Internal Medicine

## 2019-05-26 ENCOUNTER — Other Ambulatory Visit (INDEPENDENT_AMBULATORY_CARE_PROVIDER_SITE_OTHER): Payer: Medicare HMO

## 2019-05-26 ENCOUNTER — Encounter: Payer: Self-pay | Admitting: Internal Medicine

## 2019-05-26 ENCOUNTER — Ambulatory Visit (INDEPENDENT_AMBULATORY_CARE_PROVIDER_SITE_OTHER): Payer: Medicare HMO | Admitting: Internal Medicine

## 2019-05-26 ENCOUNTER — Other Ambulatory Visit: Payer: Self-pay

## 2019-05-26 VITALS — BP 122/84 | HR 67 | Temp 97.8°F | Ht 69.5 in | Wt 156.0 lb

## 2019-05-26 DIAGNOSIS — E538 Deficiency of other specified B group vitamins: Secondary | ICD-10-CM | POA: Diagnosis not present

## 2019-05-26 DIAGNOSIS — E1159 Type 2 diabetes mellitus with other circulatory complications: Secondary | ICD-10-CM

## 2019-05-26 DIAGNOSIS — E559 Vitamin D deficiency, unspecified: Secondary | ICD-10-CM | POA: Diagnosis not present

## 2019-05-26 DIAGNOSIS — E611 Iron deficiency: Secondary | ICD-10-CM

## 2019-05-26 DIAGNOSIS — Z0000 Encounter for general adult medical examination without abnormal findings: Secondary | ICD-10-CM | POA: Diagnosis not present

## 2019-05-26 LAB — IBC PANEL
Iron: 115 ug/dL (ref 42–165)
Saturation Ratios: 40.7 % (ref 20.0–50.0)
Transferrin: 202 mg/dL — ABNORMAL LOW (ref 212.0–360.0)

## 2019-05-26 LAB — URINALYSIS, ROUTINE W REFLEX MICROSCOPIC
Bilirubin Urine: NEGATIVE
Hgb urine dipstick: NEGATIVE
Ketones, ur: NEGATIVE
Leukocytes,Ua: NEGATIVE
Nitrite: NEGATIVE
RBC / HPF: NONE SEEN (ref 0–?)
Specific Gravity, Urine: 1.02 (ref 1.000–1.030)
Total Protein, Urine: NEGATIVE
Urine Glucose: 100 — AB
Urobilinogen, UA: 0.2 (ref 0.0–1.0)
WBC, UA: NONE SEEN (ref 0–?)
pH: 5.5 (ref 5.0–8.0)

## 2019-05-26 LAB — BASIC METABOLIC PANEL
BUN: 23 mg/dL (ref 6–23)
CO2: 27 mEq/L (ref 19–32)
Calcium: 9.3 mg/dL (ref 8.4–10.5)
Chloride: 104 mEq/L (ref 96–112)
Creatinine, Ser: 1.17 mg/dL (ref 0.40–1.50)
GFR: 59.22 mL/min — ABNORMAL LOW (ref >60.00)
Glucose, Bld: 164 mg/dL — ABNORMAL HIGH (ref 70–99)
Potassium: 4.2 mEq/L (ref 3.5–5.1)
Sodium: 140 mEq/L (ref 135–145)

## 2019-05-26 LAB — CBC WITH DIFFERENTIAL/PLATELET
Basophils Absolute: 0.1 10*3/uL (ref 0.0–0.1)
Basophils Relative: 1.1 % (ref 0.0–3.0)
Eosinophils Absolute: 0.2 10*3/uL (ref 0.0–0.7)
Eosinophils Relative: 2.2 % (ref 0.0–5.0)
HCT: 44.9 % (ref 39.0–52.0)
Hemoglobin: 15.1 g/dL (ref 13.0–17.0)
Lymphocytes Relative: 24.4 % (ref 12.0–46.0)
Lymphs Abs: 1.7 10*3/uL (ref 0.7–4.0)
MCHC: 33.6 g/dL (ref 30.0–36.0)
MCV: 89 fl (ref 78.0–100.0)
Monocytes Absolute: 0.7 10*3/uL (ref 0.1–1.0)
Monocytes Relative: 9.6 % (ref 3.0–12.0)
Neutro Abs: 4.5 10*3/uL (ref 1.4–7.7)
Neutrophils Relative %: 62.7 % (ref 43.0–77.0)
Platelets: 182 10*3/uL (ref 150.0–400.0)
RBC: 5.05 Mil/uL (ref 4.22–5.81)
RDW: 13 % (ref 11.5–15.5)
WBC: 7.2 10*3/uL (ref 4.0–10.5)

## 2019-05-26 LAB — MICROALBUMIN / CREATININE URINE RATIO
Creatinine,U: 115.8 mg/dL
Microalb Creat Ratio: 1.4 mg/g (ref 0.0–30.0)
Microalb, Ur: 1.7 mg/dL (ref 0.0–1.9)

## 2019-05-26 LAB — HEMOGLOBIN A1C: Hgb A1c MFr Bld: 7.1 % — ABNORMAL HIGH (ref 4.6–6.5)

## 2019-05-26 LAB — VITAMIN D 25 HYDROXY (VIT D DEFICIENCY, FRACTURES): VITD: 21.53 ng/mL — ABNORMAL LOW (ref 30.00–100.00)

## 2019-05-26 LAB — HEPATIC FUNCTION PANEL
ALT: 15 U/L (ref 0–53)
AST: 13 U/L (ref 0–37)
Albumin: 4.2 g/dL (ref 3.5–5.2)
Alkaline Phosphatase: 47 U/L (ref 39–117)
Bilirubin, Direct: 0.2 mg/dL (ref 0.0–0.3)
Total Bilirubin: 0.8 mg/dL (ref 0.2–1.2)
Total Protein: 6.7 g/dL (ref 6.0–8.3)

## 2019-05-26 LAB — TSH: TSH: 2.33 u[IU]/mL (ref 0.35–4.50)

## 2019-05-26 LAB — LIPID PANEL
Cholesterol: 114 mg/dL (ref 0–200)
HDL: 32.7 mg/dL — ABNORMAL LOW (ref >39.00)
LDL Cholesterol: 58 mg/dL (ref 0–99)
NonHDL: 81.64
Total CHOL/HDL Ratio: 3
Triglycerides: 120 mg/dL (ref 0.0–149.0)
VLDL: 24 mg/dL (ref 0.0–40.0)

## 2019-05-26 LAB — VITAMIN B12: Vitamin B-12: 254 pg/mL (ref 211–911)

## 2019-05-26 MED ORDER — FINASTERIDE 5 MG PO TABS: 5.0000 mg | ORAL_TABLET | Freq: Every day | ORAL | 3 refills | Status: DC

## 2019-05-26 MED ORDER — VITAMIN D (ERGOCALCIFEROL) 1.25 MG (50000 UNIT) PO CAPS: 50000.0000 [IU] | ORAL_CAPSULE | ORAL | 0 refills | Status: DC

## 2019-05-26 NOTE — Patient Instructions (Signed)

## 2019-05-26 NOTE — Progress Notes (Signed)
Subjective:    Patient ID: Keith Herrera, male    DOB: 08-Jul-1932, 85 y.o.   MRN: [id number]  HPI  Here for wellness and f/u;  Overall doing ok;  Pt denies Chest pain, worsening SOB, DOE, wheezing, orthopnea, PND, worsening LE edema, palpitations, dizziness or syncope.  Pt denies neurological change such as new headache, facial or extremity weakness.  Pt denies polydipsia, polyuria, or low sugar symptoms. Pt states overall good compliance with treatment and medications, good tolerability, and has been trying to follow appropriate diet.  Pt denies worsening depressive symptoms, suicidal ideation or panic. No fever, night sweats, wt loss, loss of appetite, or other constitutional symptoms.  Pt states good ability with ADL's, has low fall risk, home safety reviewed and adequate, no other significant changes in hearing or vision, and only occasionally active with exercise.  Still does treadmill for 15 min daily CBGs at home 170s in the am, often lower later in the day;  On new proscar per urology now improved prostate symptoms, last seen 2 wks ago. Denies worsening reflux, abd pain, dysphagia, n/v, bowel change or blood.  No further hives with last tx.   Past Medical History:  Diagnosis Date  . Adenomatous polyp of colon 05/2004  . Allergic rhinitis   . Anxiety   . BPH (benign prostatic hyperplasia)   . CAD (coronary artery disease)    s/p Cypher DES to LAD and pD1 2010;  LHC was done 6/12: EF 65%, circumflex patent, mid RCA 80-90% (small and nondominant), LAD and diagonal stents patent, LAD at the origin of the first diagonal 30%, ostial D2 90%, mid 80-90% (small vessel).  Continued medical therapy was recommended.   . Carotid stenosis    dopplers 02/2012: 7-20% RICA; 72-18% LICA  . Diabetes type 2, controlled ([place])   . Diverticulosis    colon  . ED (erectile dysfunction)   . Esophageal stricture   . GERD (gastroesophageal reflux disease)   . Hemorrhoids   . Hiatal hernia   . Hyperlipidemia    . Hypertension   . IBS (irritable bowel syndrome)   . Right inguinal hernia    s/p repair in 8/12  . SVT (supraventricular tachycardia) (HCC)    Past Surgical History:  Procedure Laterality Date  . CARDIAC CATHETERIZATION N/A 12/10/2016   Procedure: Left Heart Cath and Coronary Angiography;  Surgeon: Jettie Booze, MD;  Location: [facility];  Service: Cardiovascular;  Laterality: N/A;  . CARDIAC CATHETERIZATION N/A 12/10/2016   Procedure: Coronary Balloon Angioplasty;  Surgeon: Jettie Booze, MD;  Location: [facility];  Service: Cardiovascular;  Laterality: N/A;  . CARDIAC CATHETERIZATION N/A 12/10/2016   Procedure: Intravascular Pressure Wire/FFR Study;  Surgeon: Jettie Booze, MD;  Location: [facility];  Service: Cardiovascular;  Laterality: N/A;  . CORONARY STENT PLACEMENT  10/2008   x 2  . ELECTROPHYSIOLOGIC STUDY N/A 03/20/2015   no inducible SVT - Dr Lovena Le  . INGUINAL HERNIA REPAIR Right    RIH with ultrapro patch    reports that he quit smoking about 63 years ago. His smoking use included cigarettes. He has never used smokeless tobacco. He reports that he does not drink alcohol or use drugs. family history includes Bladder Cancer (age of onset: 40) in his mother; Healthy in his daughter and son; Heart attack (age of onset: 4) in his father; Pancreatic cancer (age of onset: 41) in his sister. Allergies  Allergen Reactions  . Crestor [employer]  Calcium] Rash    All over body  . Lovastatin Itching and Rash   Current Outpatient Medications on File Prior to Visit  Medication Sig Dispense Refill  . ACCU-CHEK AVIVA PLUS test strip USE FOR TESTING UP TO 4 TIMES DAILY AS DIRECTED 100 each 2  . amLODipine (NORVASC) 5 MG tablet Take 1 tablet (5 mg total) by mouth daily. Please make yearly appt with Dr. Burt Knack for August for future refills. 1st attempt 90 tablet 0  . aspirin EC 81 MG tablet Take 81 mg by mouth daily after supper.    Marland Kitchen  atorvastatin (LIPITOR) 10 MG tablet TAKE 1 TABLET (10 MG TOTAL) BY MOUTH DAILY AT 6 PM. 90 tablet 1  . blood glucose meter kit and supplies Dispense based on patient and insurance preference. Use up to four times daily as directed. (FOR ICD-10 E10.9, E11.9). 1 each 0  . Blood Glucose Monitoring Suppl (ONE TOUCH ULTRA 2) w/Device KIT Use as directed E 11.9 1 each 0  . brimonidine (ALPHAGAN) 0.2 % ophthalmic solution ADMINISTER 1 DROP TO BOTH EYES THREE (3) TIMES A DAY.  3  . carvedilol (COREG) 6.25 MG tablet TAKE 1 TABLET (6.25 MG TOTAL) BY MOUTH 2 (TWO) TIMES DAILY WITH A MEAL. 180 tablet 0  . famotidine (PEPCID) 20 MG tablet Take 1 tablet (20 mg total) by mouth 2 (two) times daily. 180 tablet 3  . fexofenadine (HM FEXOFENADINE HCL) 60 MG tablet Take 1 tablet (60 mg total) by mouth 2 (two) times daily as needed for allergies or rhinitis. 60 tablet 11  . gabapentin (NEURONTIN) 300 MG capsule 1 - 2 tab by mouth at bedtime as needed 180 capsule 1  . glipiZIDE (GLUCOTROL XL) 5 MG 24 hr tablet TAKE 1 TABLET BY MOUTH EVERY DAY WITH BREAKFAST 90 tablet 1  . Lancets MISC Use as directed twice daily E11.9 200 each 3  . latanoprost (XALATAN) 0.005 % ophthalmic solution Place 1 drop into both eyes at bedtime.  12  . meclizine (ANTIVERT) 12.5 MG tablet TAKE 1 TABLET (12.5 MG TOTAL) BY MOUTH 3 (THREE) TIMES DAILY AS NEEDED FOR DIZZINESS. 30 tablet 2  . metFORMIN (GLUCOPHAGE-XR) 500 MG 24 hr tablet Take 2 tablets (1,000 mg total) by mouth daily with breakfast. 180 tablet 3  . nitroGLYCERIN (NITROSTAT) 0.4 MG SL tablet Place 1 tablet (0.4 mg total) under the tongue every 5 (five) minutes as needed for chest pain. 40 tablet 1  . pantoprazole (PROTONIX) 40 MG tablet Take 1 tablet (40 mg total) by mouth daily. 90 tablet 3  . Probiotic Product (VSL#3 PO) Take by mouth.    . telmisartan (MICARDIS) 40 MG tablet Take 1 tablet (40 mg total) by mouth daily. 90 tablet 3  . TRADJENTA 5 MG TABS tablet TAKE 1 TABLET BY MOUTH  EVERY DAY 30 tablet 11  . traZODone (DESYREL) 50 MG tablet TAKE 0.5-1 TABLETS BY MOUTH AT BEDTIME AS NEEDED FOR SLEEP. 90 tablet 1  . triamcinolone cream (KENALOG) 0.1 % Apply 1 application topically 2 (two) times daily. 60 g 1   No current facility-administered medications on file prior to visit.    Review of Systems Constitutional: Negative for other unusual diaphoresis, sweats, appetite or weight changes HENT: Negative for other worsening hearing loss, ear pain, facial swelling, mouth sores or neck stiffness.   Eyes: Negative for other worsening pain, redness or other visual disturbance.  Respiratory: Negative for other stridor or swelling Cardiovascular: Negative for other palpitations or other chest  pain  Gastrointestinal: Negative for worsening diarrhea or loose stools, blood in stool, distention or other pain Genitourinary: Negative for hematuria, flank pain or other change in urine volume.  Musculoskeletal: Negative for myalgias or other joint swelling.  Skin: Negative for other color change, or other wound or worsening drainage.  Neurological: Negative for other syncope or numbness. Hematological: Negative for other adenopathy or swelling Psychiatric/Behavioral: Negative for hallucinations, other worsening agitation, SI, self-injury, or new decreased concentration All other system neg per pt    Objective:   Physical Exam BP 122/84   Pulse 67   Temp 97.8 F (36.6 C) (Oral)   Ht 5' 9.5" (1.765 m)   Wt 156 lb (70.8 kg)   SpO2 97%   BMI 22.71 kg/m  VS noted,  Constitutional: Pt is oriented to person, place, and time. Appears well-developed and well-nourished, in no significant distress and comfortable Head: Normocephalic and atraumatic  Eyes: Conjunctivae and EOM are normal. Pupils are equal, round, and reactive to light Right Ear: External ear normal without discharge Left Ear: External ear normal without discharge Nose: Nose without discharge or deformity Mouth/Throat:  Oropharynx is without other ulcerations and moist  Neck: Normal range of motion. Neck supple. No JVD present. No tracheal deviation present or significant neck LA or mass Cardiovascular: Normal rate, regular rhythm, normal heart sounds and intact distal pulses.   Pulmonary/Chest: WOB normal and breath sounds without rales or wheezing  Abdominal: Soft. Bowel sounds are normal. NT. No HSM  Musculoskeletal: Normal range of motion. Exhibits no edema Lymphadenopathy: Has no other cervical adenopathy.  Neurological: Pt is alert and oriented to person, place, and time. Pt has normal reflexes. No cranial nerve deficit. Motor grossly intact, Gait intact Skin: Skin is warm and dry. No rash noted or new ulcerations Psychiatric:  Has normal mood and affect. Behavior is normal without agitation No other exam findings Lab Results  Component Value Date   WBC 7.2 05/26/2019   HGB 15.1 05/26/2019   HCT 44.9 05/26/2019   PLT 182.0 05/26/2019   GLUCOSE 164 (H) 05/26/2019   CHOL 114 05/26/2019   TRIG 120.0 05/26/2019   HDL 32.70 (L) 05/26/2019   LDLDIRECT 82.0 05/19/2018   LDLCALC 58 05/26/2019   ALT 15 05/26/2019   AST 13 05/26/2019   NA 140 05/26/2019   K 4.2 05/26/2019   CL 104 05/26/2019   CREATININE 1.17 05/26/2019   BUN 23 05/26/2019   CO2 27 05/26/2019   TSH 2.33 05/26/2019   PSA 2.49 12/21/2012   INR 1.15 12/10/2016   HGBA1C 7.1 (H) 05/26/2019   MICROALBUR 1.7 05/26/2019       Assessment & Plan:   

## 2019-05-27 ENCOUNTER — Telehealth: Payer: Self-pay

## 2019-05-27 NOTE — Telephone Encounter (Signed)
-----   Message from [name], MD sent at 05/26/2019  7:32 PM EDT ----- See below

## 2019-05-27 NOTE — Telephone Encounter (Signed)
Pt has been informed of results and expressed understanding.  °

## 2019-05-29 ENCOUNTER — Encounter: Payer: Self-pay | Admitting: Internal Medicine

## 2019-05-29 NOTE — Assessment & Plan Note (Signed)
stable overall by history and exam, recent data reviewed with pt, and pt to continue medical treatment as before,  to f/u any worsening symptoms or concerns  

## 2019-05-29 NOTE — Assessment & Plan Note (Signed)

## 2019-06-03 ENCOUNTER — Other Ambulatory Visit: Payer: Self-pay | Admitting: Internal Medicine

## 2019-06-22 ENCOUNTER — Other Ambulatory Visit: Payer: Self-pay | Admitting: Internal Medicine

## 2019-07-01 ENCOUNTER — Telehealth: Payer: Self-pay

## 2019-07-01 NOTE — Telephone Encounter (Signed)
Ok to have pt check with his pharmacy regarding a similar medication that is covered, since I would not know this, thanks

## 2019-07-01 NOTE — Telephone Encounter (Signed)
Copied from [place] [id number]. Topic: General - Other >> Jul 01, 2019  9:21 AM Leward Quan A wrote: Reason for CRM: Patient called to inform Dr Jenny Reichmann that he need to change the TRADJENTA 5 MG TABS tablet to something else because the price for 30 pills have increased from $30 a month to $170 and that is too expensive. Please send new Rx to pharmacy on file and call patient Ph# [phone]

## 2019-07-02 NOTE — Telephone Encounter (Signed)
Pt has been informed.

## 2019-07-06 ENCOUNTER — Other Ambulatory Visit: Payer: Self-pay | Admitting: Internal Medicine

## 2019-07-07 ENCOUNTER — Encounter

## 2019-07-07 ENCOUNTER — Encounter: Payer: Self-pay | Admitting: Internal Medicine

## 2019-07-07 ENCOUNTER — Ambulatory Visit (INDEPENDENT_AMBULATORY_CARE_PROVIDER_SITE_OTHER): Payer: Medicare HMO | Admitting: Internal Medicine

## 2019-07-07 DIAGNOSIS — E119 Type 2 diabetes mellitus without complications: Secondary | ICD-10-CM | POA: Diagnosis not present

## 2019-07-07 DIAGNOSIS — R110 Nausea: Secondary | ICD-10-CM

## 2019-07-07 MED ORDER — ONDANSETRON HCL 4 MG PO TABS: 4.0000 mg | ORAL_TABLET | Freq: Three times a day (TID) | ORAL | 1 refills | Status: DC | PRN

## 2019-07-07 MED ORDER — FAMOTIDINE 20 MG PO TABS: 20.0000 mg | ORAL_TABLET | Freq: Two times a day (BID) | ORAL | 3 refills | Status: DC

## 2019-07-07 MED ORDER — METFORMIN HCL ER 500 MG PO TB24: 1500.0000 mg | ORAL_TABLET | Freq: Every day | ORAL | 3 refills | Status: DC

## 2019-07-07 NOTE — Progress Notes (Signed)
Patient ID: Keith Herrera, male   DOB: 1931-12-20, 85 y.o.   MRN: [id number]  Phone note  Cumulative time during 7-day interval 11 min, there was not an associated office visit for this concern within a 7 day period.  Verbal consent for services obtained from patient prior to services given.  Names of all persons present for services: Cathlean Cower, MD, patient  Chief complaint: nausea  History, background, results pertinent:  85 yo M with 1 wk worsening gas feeling, belching with nausea such that he feels if he could just vomit he would feel improved; denies fever, vomiting, abd pain, bowel change such as constipation and diarrhea, and no blood.  He is taking protonix, but not the pepcid.  Denies dysphagia, wt loss, appetite loss.  Sometimes gets dizzy when nausea worse.  Has been on same metformin for some times. Does mention the tradjenta is now $400 in the donut hole, so unable to afford. Past Medical History:  Diagnosis Date  . Adenomatous polyp of colon 05/2004  . Allergic rhinitis   . Anxiety   . BPH (benign prostatic hyperplasia)   . CAD (coronary artery disease)    s/p Cypher DES to LAD and pD1 2010;  LHC was done 6/12: EF 65%, circumflex patent, mid RCA 80-90% (small and nondominant), LAD and diagonal stents patent, LAD at the origin of the first diagonal 30%, ostial D2 90%, mid 80-90% (small vessel).  Continued medical therapy was recommended.   . Carotid stenosis    dopplers 02/2012: 3-30% RICA; 07-62% LICA  . Diabetes type 2, controlled ([place])   . Diverticulosis    colon  . ED (erectile dysfunction)   . Esophageal stricture   . GERD (gastroesophageal reflux disease)   . Hemorrhoids   . Hiatal hernia   . Hyperlipidemia   . Hypertension   . IBS (irritable bowel syndrome)   . Right inguinal hernia    s/p repair in 8/12  . SVT (supraventricular tachycardia) ([facility])    No results found for this or any previous visit (from the past 48 hour(s)). Current Outpatient Medications on  File Prior to Visit  Medication Sig Dispense Refill  . ACCU-CHEK AVIVA PLUS test strip USE FOR TESTING UP TO 4 TIMES DAILY AS DIRECTED 100 each 2  . amLODipine (NORVASC) 5 MG tablet Take 1 tablet (5 mg total) by mouth daily. Please make yearly appt with Dr. Burt Knack for August for future refills. 1st attempt 90 tablet 0  . aspirin EC 81 MG tablet Take 81 mg by mouth daily after supper.    Marland Kitchen atorvastatin (LIPITOR) 10 MG tablet TAKE 1 TABLET (10 MG TOTAL) BY MOUTH DAILY AT 6 PM. 90 tablet 1  . blood glucose meter kit and supplies Dispense based on patient and insurance preference. Use up to four times daily as directed. (FOR ICD-10 E10.9, E11.9). 1 each 0  . Blood Glucose Monitoring Suppl (ONE TOUCH ULTRA 2) w/Device KIT Use as directed E 11.9 1 each 0  . brimonidine (ALPHAGAN) 0.2 % ophthalmic solution ADMINISTER 1 DROP TO BOTH EYES THREE (3) TIMES A DAY.  3  . carvedilol (COREG) 6.25 MG tablet TAKE 1 TABLET (6.25 MG TOTAL) BY MOUTH 2 (TWO) TIMES DAILY WITH A MEAL. 180 tablet 0  . famotidine (PEPCID) 20 MG tablet Take 1 tablet (20 mg total) by mouth 2 (two) times daily. 180 tablet 3  . fexofenadine (HM FEXOFENADINE HCL) 60 MG tablet Take 1 tablet (60 mg total) by mouth 2 (two)  times daily as needed for allergies or rhinitis. 60 tablet 11  . finasteride (PROSCAR) 5 MG tablet Take 1 tablet (5 mg total) by mouth daily. 90 tablet 3  . gabapentin (NEURONTIN) 300 MG capsule 1 - 2 tab by mouth at bedtime as needed 180 capsule 1  . glipiZIDE (GLUCOTROL XL) 5 MG 24 hr tablet TAKE 1 TABLET BY MOUTH EVERY DAY WITH BREAKFAST 90 tablet 1  . Lancets MISC Use as directed twice daily E11.9 200 each 3  . lansoprazole (PREVACID) 30 MG capsule TAKE 1 CAPSULE (30 MG TOTAL) BY MOUTH DAILY AT 12 NOON. 90 capsule 1  . latanoprost (XALATAN) 0.005 % ophthalmic solution Place 1 drop into both eyes at bedtime.  12  . meclizine (ANTIVERT) 12.5 MG tablet TAKE 1 TABLET (12.5 MG TOTAL) BY MOUTH 3 (THREE) TIMES DAILY AS NEEDED FOR  DIZZINESS. 30 tablet 2  . metFORMIN (GLUCOPHAGE-XR) 500 MG 24 hr tablet Take 2 tablets (1,000 mg total) by mouth daily with breakfast. 180 tablet 3  . nitroGLYCERIN (NITROSTAT) 0.4 MG SL tablet Place 1 tablet (0.4 mg total) under the tongue every 5 (five) minutes as needed for chest pain. 40 tablet 1  . pantoprazole (PROTONIX) 40 MG tablet Take 1 tablet (40 mg total) by mouth daily. 90 tablet 3  . Probiotic Product (VSL#3 PO) Take by mouth.    . telmisartan (MICARDIS) 40 MG tablet Take 1 tablet (40 mg total) by mouth daily. 90 tablet 3  . TRADJENTA 5 MG TABS tablet TAKE 1 TABLET BY MOUTH EVERY DAY 90 tablet 1  . traZODone (DESYREL) 50 MG tablet TAKE 0.5-1 TABLETS BY MOUTH AT BEDTIME AS NEEDED FOR SLEEP. 90 tablet 1  . triamcinolone cream (KENALOG) 0.1 % Apply 1 application topically 2 (two) times daily. 60 g 1  . Vitamin D, Ergocalciferol, (DRISDOL) 1.25 MG (50000 UT) CAPS capsule Take 1 capsule (50,000 Units total) by mouth every 7 (seven) days. 12 capsule 0   No current facility-administered medications on file prior to visit.    Lab Results  Component Value Date   WBC 7.2 05/26/2019   HGB 15.1 05/26/2019   HCT 44.9 05/26/2019   PLT 182.0 05/26/2019   GLUCOSE 164 (H) 05/26/2019   CHOL 114 05/26/2019   TRIG 120.0 05/26/2019   HDL 32.70 (L) 05/26/2019   LDLDIRECT 82.0 05/19/2018   LDLCALC 58 05/26/2019   ALT 15 05/26/2019   AST 13 05/26/2019   NA 140 05/26/2019   K 4.2 05/26/2019   CL 104 05/26/2019   CREATININE 1.17 05/26/2019   BUN 23 05/26/2019   CO2 27 05/26/2019   TSH 2.33 05/26/2019   PSA 2.49 12/21/2012   INR 1.15 12/10/2016   HGBA1C 7.1 (H) 05/26/2019   MICROALBUR 1.7 05/26/2019   A/P/next steps:   Nausea - etiology unclear but includes gastritis, for add pepcid 20 bid, cont PPI, zofran prn, and refer GI per pt request  DM - ok to d/c tradjenta due to cost, and increase metformin ER 500 mg to 3 per day  Cathlean Cower MD

## 2019-07-07 NOTE — Patient Instructions (Signed)
Ok to stop the tradjenta (and the prilosec if you are taking this)  Ok to increase the metformin ER 500 mg to 3 pills in the AM  Please take all new medication as prescribed - the pepcid 20 mg twice per day, and the zofran as needed for nausea  Please continue all other medications as before  Please have the pharmacy call with any other refills you may need.  Please continue your efforts at being more active, low cholesterol diet, and weight control.  Please keep your appointments with your specialists as you may have planned  You will be contacted regarding the referral for: Gastroenterology

## 2019-07-07 NOTE — Progress Notes (Signed)
Cardiology Office Note:    Date:  07/08/2019   ID:  ZALMAN HULL, DOB 1932-08-08, MRN [id number]  PCP:  [name], MD  Cardiologist:  Sherren Mocha, MD  Referring MD: [name], MD   Chief Complaint  Patient presents with   Follow-up   Coronary Artery Disease    History of Present Illness:    Keith Herrera is a 85 y.o. male with a past medical history significant for CAD s/p DES to LAD and D1 2010, carotid artery stenosis, hypertension, hyperlipidemia, type 2 diabetes, GERD, esophageal stricture, and SVT.  He had cardiac cath in 11/2016 with 80% stenosed first diagonal that was treated with PTCA.  A stent could not be delivered due to the prior LAD and diagonal stents.  He was continued on medical therapy with consideration for possible long-term clopidogrel.  The patient has been seen by Dr. Lovena Le in the past for palpitations.  He has been noted to have PVCs and treatment options have been limited by his CAD.  He was seen in the office 04/16/2019 for chest pressure.  This was described as epigastric/left-sided chest pain for the prior 3-4 weeks.  This discomfort was felt to be related to GI origin.  He was having no exertional chest discomfort and was walking on treadmill 20-30 minutes on most days.  The patient was just seen yesterday with complaints of nausea, unclear etiology.  He is being referred to a gastroenterologist. He has not been called for appt yet.   He has had several years of these GI type complaints  Keith Herrera is here today for 52-monthfollow-up.  He is upset that he will not be seen Dr. CBurt Knack  He says he has been feeling poorly with discomfort in his lower chest.  He does housework and yard work with mowing. He also has a treadmill that he walks on for 25 minutes. He has no exertional chest discomfort or shortness of breath. He does have occ mild shortness of breath no associated with exertion. He often feels like if he could just burp he would  feel better. He can point to a spot in epigastric area and is tender to palpation in that area. His discomfort is worse after eating, especially certain foods like tomato products and orange juice and sometimes with laying down.   Patient denies any orthopnea, PND, edema.  He has occasional dizziness when laying on his left side that improves when he turns to his right side.  He also has occasional lightheadedness when first getting up in the morning and he is careful to avoid falls.  No dizziness during the day or with activity.   Past Medical History:  Diagnosis Date   Adenomatous polyp of colon 05/2004   Allergic rhinitis    Anxiety    BPH (benign prostatic hyperplasia)    CAD (coronary artery disease)    s/p Cypher DES to LAD and pD1 2010;  LHC was done 6/12: EF 65%, circumflex patent, mid RCA 80-90% (small and nondominant), LAD and diagonal stents patent, LAD at the origin of the first diagonal 30%, ostial D2 90%, mid 80-90% (small vessel).  Continued medical therapy was recommended.    Carotid stenosis    dopplers 02/2012: 01-21%RICA; 497-58%LICA   Diabetes type 2, controlled (HCC)    Diverticulosis    colon   ED (erectile dysfunction)    Esophageal stricture    GERD (gastroesophageal reflux disease)    Hemorrhoids  Hiatal hernia    Hyperlipidemia    Hypertension    IBS (irritable bowel syndrome)    Right inguinal hernia    s/p repair in 8/12   SVT (supraventricular tachycardia) ([place])     Past Surgical History:  Procedure Laterality Date   CARDIAC CATHETERIZATION N/A 12/10/2016   Procedure: Left Heart Cath and Coronary Angiography;  Surgeon: Jettie Booze, MD;  Location: [facility];  Service: Cardiovascular;  Laterality: N/A;   CARDIAC CATHETERIZATION N/A 12/10/2016   Procedure: Coronary Balloon Angioplasty;  Surgeon: Jettie Booze, MD;  Location: [facility];  Service: Cardiovascular;  Laterality: N/A;   CARDIAC CATHETERIZATION  N/A 12/10/2016   Procedure: Intravascular Pressure Wire/FFR Study;  Surgeon: Jettie Booze, MD;  Location: [facility];  Service: Cardiovascular;  Laterality: N/A;   CORONARY STENT PLACEMENT  10/2008   x 2   ELECTROPHYSIOLOGIC STUDY N/A 03/20/2015   no inducible SVT - Dr Irish Elders HERNIA REPAIR Right    RIH with ultrapro patch    Current Medications: Current Meds  Medication Sig   ACCU-CHEK AVIVA PLUS test strip USE FOR TESTING UP TO 4 TIMES DAILY AS DIRECTED   amLODipine (NORVASC) 5 MG tablet Take 1 tablet (5 mg total) by mouth daily. Please make yearly appt with Dr. Burt Knack for August for future refills. 1st attempt   aspirin EC 81 MG tablet Take 81 mg by mouth daily after supper.   atorvastatin (LIPITOR) 10 MG tablet TAKE 1 TABLET (10 MG TOTAL) BY MOUTH DAILY AT 6 PM.   blood glucose meter kit and supplies Dispense based on patient and insurance preference. Use up to four times daily as directed. (FOR ICD-10 E10.9, E11.9).   Blood Glucose Monitoring Suppl (ONE TOUCH ULTRA 2) w/Device KIT Use as directed E 11.9   brimonidine (ALPHAGAN) 0.2 % ophthalmic solution ADMINISTER 1 DROP TO BOTH EYES THREE (3) TIMES A DAY.   carvedilol (COREG) 6.25 MG tablet TAKE 1 TABLET (6.25 MG TOTAL) BY MOUTH 2 (TWO) TIMES DAILY WITH A MEAL.   famotidine (PEPCID) 20 MG tablet Take 1 tablet (20 mg total) by mouth 2 (two) times daily.   fexofenadine (HM FEXOFENADINE HCL) 60 MG tablet Take 1 tablet (60 mg total) by mouth 2 (two) times daily as needed for allergies or rhinitis.   gabapentin (NEURONTIN) 300 MG capsule 1 - 2 tab by mouth at bedtime as needed   glipiZIDE (GLUCOTROL XL) 5 MG 24 hr tablet TAKE 1 TABLET BY MOUTH EVERY DAY WITH BREAKFAST   Lancets MISC Use as directed twice daily E11.9   latanoprost (XALATAN) 0.005 % ophthalmic solution Place 1 drop into both eyes at bedtime.   losartan (COZAAR) 100 MG tablet Take 100 mg by mouth daily.   meclizine (ANTIVERT) 12.5 MG  tablet TAKE 1 TABLET (12.5 MG TOTAL) BY MOUTH 3 (THREE) TIMES DAILY AS NEEDED FOR DIZZINESS.   metFORMIN (GLUCOPHAGE-XR) 500 MG 24 hr tablet Take 3 tablets (1,500 mg total) by mouth daily with breakfast.   nitroGLYCERIN (NITROSTAT) 0.4 MG SL tablet Place 1 tablet (0.4 mg total) under the tongue every 5 (five) minutes as needed for chest pain.   ondansetron (ZOFRAN) 4 MG tablet Take 1 tablet (4 mg total) by mouth every 8 (eight) hours as needed for nausea or vomiting.   pantoprazole (PROTONIX) 40 MG tablet Take 1 tablet (40 mg total) by mouth daily.   Probiotic Product (VSL#3 PO) Take by mouth.   traZODone (  DESYREL) 50 MG tablet TAKE 0.5-1 TABLETS BY MOUTH AT BEDTIME AS NEEDED FOR SLEEP.   triamcinolone cream (KENALOG) 0.1 % Apply 1 application topically 2 (two) times daily.   Vitamin D, Ergocalciferol, (DRISDOL) 1.25 MG (50000 UT) CAPS capsule Take 1 capsule (50,000 Units total) by mouth every 7 (seven) days.   [DISCONTINUED] finasteride (PROSCAR) 5 MG tablet Take 1 tablet (5 mg total) by mouth daily.   [DISCONTINUED] lansoprazole (PREVACID) 30 MG capsule TAKE 1 CAPSULE (30 MG TOTAL) BY MOUTH DAILY AT 12 NOON.   [DISCONTINUED] telmisartan (MICARDIS) 40 MG tablet Take 1 tablet (40 mg total) by mouth daily.     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  Social Needs   Financial resource strain: Not on file   Food insecurity    Worry: Not on file    Inability: Not on file   Transportation needs    Medical: Not on file    Non-medical: Not on file  Tobacco Use   Smoking status: Former Smoker    Types: Cigarettes    Quit date: 03/05/1956    Years since quitting: 63.3    Smokeless tobacco: Never Used   Tobacco comment: smoked in teens  Substance and Sexual Activity   Alcohol use: No    Alcohol/week: 0.0 standard drinks   Drug use: No   Sexual activity: Not on file  Lifestyle   Physical activity    Days per week: Not on file    Minutes per session: Not on file   Stress: Not on file  Relationships   Social connections    Talks on phone: Not on file    Gets together: Not on file    Attends religious service: Not on file    Active member of club or organization: Not on file    Attends meetings of clubs or organizations: Not on file    Relationship status: Not on file  Other Topics Concern   Not on file  Social History Narrative   Lives with wife in a 3 story home.  Has 2 living children.  1 passed away in a car accident.     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       Family History: The patient's family history includes Bladder Cancer (age of onset: 47) in his mother; Healthy in his daughter and son; Heart attack (age of onset: 95) in his father; Pancreatic cancer (age of onset: 57) in his sister. There is no history of Colon cancer. ROS:   Please see the history of present illness.     All other systems reviewed and are negative.  EKGs/Labs/Other Studies Reviewed:    The following studies were reviewed today:  Echo 12-09-2016: Study Conclusions  - Left ventricle: The cavity size was normal. Systolic function was vigorous. The estimated ejection fraction was in the range of 65% to 70%. Wall motion was normal; there were no regional wall motion abnormalities. Doppler parameters are consistent with abnormal left ventricular relaxation (grade 1 diastolic dysfunction). Doppler parameters are consistent with elevated ventricular end-diastolic filling pressure. -  Aortic valve: Trileaflet; normal thickness leaflets. There was no regurgitation. - Aortic root:  The aortic root was normal in size. - Mitral valve: Calcified annulus. Mildly thickened leaflets . Transvalvular velocity was within the normal range. There was no evidence for stenosis. There was no regurgitation. - Left atrium: The atrium was normal in size. - Right ventricle: Systolic function was normal. - Right atrium: The atrium was normal in size. - Tricuspid valve: There was mild regurgitation. - Pulmonary arteries: Systolic pressure was within the normal range. - Inferior vena cava: The vessel was normal in size. - Pericardium, extracardiac: There was no pericardial effusion.  Cardiac Cath 12-10-2016: Conclusion     Non-dominant vessel, Mid RCA lesion, 100 %stenosed. This has progressed since 2012. There are right to right and left to right collaterals.  Patent stent in the first Diag.  Patent proximal LAD stent.  Ost 2nd Diag to mid 2nd Diag lesion, 75 %stenosed. Stable disease since 2012.  1st Diag lesion, 80 %stenosed. This lesion was past the prior stent. After PTCA with a 2.0 balloon, a stent could not be delivered due to the prior LAD and diagional stents.  Post intervention with PTCA, there is a 10% residual stenosis.  LV end diastolic pressure is normal.  There is no aortic valve stenosis.  50% mid LAD lesion which was assessed by FFR. Resting FFR was 0.89 and after adenosine, FFR dropped to 0.82.  Continue dual antiplatelet therapy for at least a month. No stent was actually placed. The stent could not be delivered due to the prior LAD and diagonal stents. There is an excellent angioplasty result.  FFR of the mid LAD was negative. Continue aggressive medical therapy. Can consider continuing clopidogrel long-term.     EKG:  none  Recent Labs: 05/26/2019: ALT 15; BUN 23; Creatinine, Ser 1.17; Hemoglobin 15.1; Platelets 182.0; Potassium 4.2; Sodium 140; TSH 2.33   Recent Lipid Panel    Component Value Date/Time   CHOL 114 05/26/2019 1112    TRIG 120.0 05/26/2019 1112   TRIG 128 11/19/2006 1032   HDL 32.70 (L) 05/26/2019 1112   CHOLHDL 3 05/26/2019 1112   VLDL 24.0 05/26/2019 1112   LDLCALC 58 05/26/2019 1112   LDLDIRECT 82.0 05/19/2018 1058    Physical Exam:    VS:  BP 140/60    Pulse (!) 42    Ht 5' 9.5" (1.765 m)    Wt 156 lb 6.4 oz (70.9 kg)    SpO2 98%    BMI 22.77 kg/m     Wt Readings from Last 3 Encounters:  07/08/19 156 lb 6.4 oz (70.9 kg)  05/26/19 156 lb (70.8 kg)  04/23/19 156 lb (70.8 kg)     Physical Exam  Constitutional: He is oriented to person, place, and time. He appears well-developed and well-nourished. No distress.  HENT:  Head: Normocephalic and atraumatic.  Neck: Normal range of motion. Neck supple. No JVD present.  Cardiovascular: Regular rhythm, normal heart sounds and intact distal pulses. Bradycardia present. Exam reveals no gallop and no friction rub.  No murmur heard. Pulmonary/Chest: Effort normal and breath sounds normal. No respiratory distress. He has no wheezes. He has no rales.  Abdominal: Soft. Bowel sounds are normal.  Musculoskeletal: Normal range of motion.        General: No deformity or edema.  Neurological: He is alert and oriented to person, place, and time.  Skin: Skin is warm and dry.  Psychiatric: He has a normal mood and affect. His  behavior is normal. Judgment and thought content normal.  Vitals reviewed.    ASSESSMENT:    1. Coronary artery disease involving native coronary artery of native heart without angina pectoris   2. Essential (primary) hypertension   3. Hyperlipidemia LDL goal <70   4. Type 2 diabetes mellitus with vascular disease ([place])   5. Heart palpitations    PLAN:    In order of problems listed above:  CAD -S/P DES to LAD and D1 2010. Cardiac cath in 11/2016 with 80% stenosed first diagonal that was treated with PTCA.  A stent could not be delivered due to the prior LAD and diagonal stents.  He was continued on medical therapy with  consideration for possible long-term clopidogrel. -Medical therapy includes aspirin 81 mg and statin -Patient has had nonexertional epigastric/chest discomfort felt to be GI in origin.  Today he has epigastric tenderness to palpation and describes very GI related symptoms.  He is active, doing yard work and walking on a treadmill with no exertional symptoms.  He and I together feel that his symptoms are not consistent with cardiac origin, but more so with GI origin.  We will defer a stress test at this time.  I advised him to let us know if he has exertional symptoms and a stress test could be done.  GERD -Pt has ongoing epigastric pain esp after eating and occ when laying down. He states he has a hiatal hernia.  History indicates prior esophageal stricture.  He has epigastric tenderness.  -He has a referral to GI but has not been called for appt yet.  -He was just switched to from Prevacid to protonix yesterday. Advised to take pepcid BID but pt was not aware of this. I will put on his discharge instructions. Along with infor on GERD.  -Symptoms are worse after certain foods like tomato sauce. He drinks only decaf coffee. He avoids orange juice as it makes it worse.  -Pt says he has seen Dr. Fuller Plan in the past.   Hypertension -On amlodipine 5 mg, carvedilol 6.25 mg and losartan 100 mg daily. -Labs last month showed normal renal function and potassium -BP fairly well controlled.  No changes.  Hyperlipidemia -On atorvastatin 10 mg daily, per PCP -Lipid panel on 05/26/2019 showed LDL 58 which is at goal of <70.  Continue current therapy  Type 2 diabetes -Management per PCP, on oral hypoglycemics -A1c 7.1 on 05/26/2019 which is down from 9.4 in January.  Palpitations -Heart monitor in 12/2016 showed frequent PACs and rare PVCs.  Daytime sinus bradycardia with heart rates down to 40 bpm.  Very brief nonsustained atrial and nonsustained ventricular tachycardia.  No long pauses. -Patient has been  evaluated by Dr. Lovena Le with limited treatment options in the setting of his coronary artery disease.  Observation is recommended. -Pt has only rare brief flutter.   Bradycardia -Heart rate in the 40s.  Previously documented.  Patient appears to be tolerating it well.  Medication Adjustments/Labs and Tests Ordered: Current medicines are reviewed at length with the patient today.  Concerns regarding medicines are outlined above. Labs and tests ordered and medication changes are outlined in the patient instructions below:  Patient Instructions  Medication Instructions:  Your physician recommends that you continue on your current medications as directed. Please refer to the Current Medication list given to you today.  If you need a refill on your cardiac medications before your next appointment, please call your pharmacy.   Lab work: None  If you have labs (blood work) drawn today and your tests are completely normal, you will receive your results only by:  [place] (if you have MyChart) OR  A paper copy in the mail If you have any lab test that is abnormal or we need to change your treatment, we will call you to review the results.  Testing/Procedures: None   Follow-Up: At [facility], you and your health needs are our priority.  As part of our continuing mission to provide you with exceptional heart care, we have created designated Provider Care Teams.  These Care Teams include your primary Cardiologist (physician) and Advanced Practice Providers (APPs -  Physician Assistants and Nurse Practitioners) who all work together to provide you with the care you need, when you need it. You will need a follow up appointment in:  6 months.  Please call our office 2 months in advance to schedule this appointment.  You may see Sherren Mocha, MD or one of the following Advanced Practice Providers on your designated Care Team: Richardson Dopp, PA-C [place], [place]  Daune Perch,  NP  Any Other Special Instructions Will Be Listed Below (If Applicable).  -As prescribed by Dr. Jenny Reichmann, take Pepcid 20 mg (Famotidine) twice a day (can get over the counter) as well as protonix 40 mg daily for acid reflux.    Gastroesophageal Reflux Disease, Adult Gastroesophageal reflux (GER) happens when acid from the stomach flows up into the tube that connects the mouth and the stomach (esophagus). Normally, food travels down the esophagus and stays in the stomach to be digested. With GER, food and stomach acid sometimes move back up into the esophagus. You may have a disease called gastroesophageal reflux disease (GERD) if the reflux:  Happens often.  Causes frequent or very bad symptoms.  Causes problems such as damage to the esophagus. When this happens, the esophagus becomes sore and swollen (inflamed). Over time, GERD can make small holes (ulcers) in the lining of the esophagus. What are the causes? This condition is caused by a problem with the muscle between the esophagus and the stomach. When this muscle is weak or not normal, it does not close properly to keep food and acid from coming back up from the stomach. The muscle can be weak because of:  Tobacco use.  Pregnancy.  Having a certain type of hernia (hiatal hernia).  Alcohol use.  Certain foods and drinks, such as coffee, chocolate, onions, and peppermint. What increases the risk? You are more likely to develop this condition if you:  Are overweight.  Have a disease that affects your connective tissue.  Use NSAID medicines. What are the signs or symptoms? Symptoms of this condition include:  Heartburn.  Difficult or painful swallowing.  The feeling of having a lump in the throat.  A bitter taste in the mouth.  Bad breath.  Having a lot of saliva.  Having an upset or bloated stomach.  Belching.  Chest pain. Different conditions can cause chest pain. Make sure you see your doctor if you have chest  pain.  Shortness of breath or noisy breathing (wheezing).  Ongoing (chronic) cough or a cough at night.  Wearing away of the surface of teeth (tooth enamel).  Weight loss. How is this treated? Treatment will depend on how bad your symptoms are. Your doctor may suggest:  Changes to your diet.  Medicine.  Surgery. Follow these instructions at home: Eating and drinking   Follow a diet as told by your  doctor. You may need to avoid foods and drinks such as: ? Coffee and tea (with or without caffeine). ? Drinks that contain alcohol. ? Energy drinks and sports drinks. ? Bubbly (carbonated) drinks or sodas. ? Chocolate and cocoa. ? Peppermint and mint flavorings. ? Garlic and onions. ? Horseradish. ? Spicy and acidic foods. These include peppers, chili powder, curry powder, vinegar, hot sauces, and BBQ sauce. ? Citrus fruit juices and citrus fruits, such as oranges, lemons, and limes. ? Tomato-based foods. These include red sauce, chili, salsa, and pizza with red sauce. ? Fried and fatty foods. These include donuts, french fries, potato chips, and high-fat dressings. ? High-fat meats. These include hot dogs, rib eye steak, sausage, ham, and bacon. ? High-fat dairy items, such as whole milk, butter, and cream cheese.  Eat small meals often. Avoid eating large meals.  Avoid drinking large amounts of liquid with your meals.  Avoid eating meals during the 2-3 hours before bedtime.  Avoid lying down right after you eat.  Do not exercise right after you eat. Lifestyle   Do not use any products that contain nicotine or tobacco. These include cigarettes, e-cigarettes, and chewing tobacco. If you need help quitting, ask your doctor.  Try to lower your stress. If you need help doing this, ask your doctor.  If you are overweight, lose an amount of weight that is healthy for you. Ask your doctor about a safe weight loss goal. General instructions  Pay attention to any changes in  your symptoms.  Take over-the-counter and prescription medicines only as told by your doctor. Do not take aspirin, ibuprofen, or other NSAIDs unless your doctor says it is okay.  Wear loose clothes. Do not wear anything tight around your waist.  Raise (elevate) the head of your bed about 6 inches (15 cm).  Avoid bending over if this makes your symptoms worse.  Keep all follow-up visits as told by your doctor. This is important. Contact a doctor if:  You have new symptoms.  You lose weight and you do not know why.  You have trouble swallowing or it hurts to swallow.  You have wheezing or a cough that keeps happening.  Your symptoms do not get better with treatment.  You have a hoarse voice. Get help right away if:  You have pain in your arms, neck, jaw, teeth, or back.  You feel sweaty, dizzy, or light-headed.  You have chest pain or shortness of breath.  You throw up (vomit) and your throw-up looks like blood or coffee grounds.  You pass out (faint).  Your poop (stool) is bloody or black.  You cannot swallow, drink, or eat. Summary  If a person has gastroesophageal reflux disease (GERD), food and stomach acid move back up into the esophagus and cause symptoms or problems such as damage to the esophagus.  Treatment will depend on how bad your symptoms are.  Follow a diet as told by your doctor.  Take all medicines only as told by your doctor. This information is not intended to replace advice given to you by your health care provider. Make sure you discuss any questions you have with your health care provider. Document Released: 04/22/2008 Document Revised: 05/13/2018 Document Reviewed: 05/13/2018 Elsevier Patient Education  2020 [place], Daune Perch, NP  07/08/2019 8:50 AM    [place]

## 2019-07-08 ENCOUNTER — Ambulatory Visit (INDEPENDENT_AMBULATORY_CARE_PROVIDER_SITE_OTHER): Payer: Medicare HMO | Admitting: Cardiology

## 2019-07-08 ENCOUNTER — Other Ambulatory Visit: Payer: Self-pay

## 2019-07-08 ENCOUNTER — Encounter: Payer: Self-pay | Admitting: Cardiology

## 2019-07-08 VITALS — BP 140/60 | HR 42 | Ht 69.5 in | Wt 156.4 lb

## 2019-07-08 DIAGNOSIS — I10 Essential (primary) hypertension: Secondary | ICD-10-CM | POA: Diagnosis not present

## 2019-07-08 DIAGNOSIS — E1159 Type 2 diabetes mellitus with other circulatory complications: Secondary | ICD-10-CM | POA: Diagnosis not present

## 2019-07-08 DIAGNOSIS — R002 Palpitations: Secondary | ICD-10-CM

## 2019-07-08 DIAGNOSIS — I2510 Atherosclerotic heart disease of native coronary artery without angina pectoris: Secondary | ICD-10-CM | POA: Diagnosis not present

## 2019-07-08 DIAGNOSIS — E785 Hyperlipidemia, unspecified: Secondary | ICD-10-CM | POA: Diagnosis not present

## 2019-07-08 NOTE — Patient Instructions (Addendum)
Medication Instructions:  Your physician recommends that you continue on your current medications as directed. Please refer to the Current Medication list given to you today.  If you need a refill on your cardiac medications before your next appointment, please call your pharmacy.   Lab work: None   If you have labs (blood work) drawn today and your tests are completely normal, you will receive your results only by: Marland Kitchen MyChart Message (if you have MyChart) OR . A paper copy in the mail If you have any lab test that is abnormal or we need to change your treatment, we will call you to review the results.  Testing/Procedures: None   Follow-Up: At [facility], you and your health needs are our priority.  As part of our continuing mission to provide you with exceptional heart care, we have created designated Provider Care Teams.  These Care Teams include your primary Cardiologist (physician) and Advanced Practice Providers (APPs -  Physician Assistants and Nurse Practitioners) who all work together to provide you with the care you need, when you need it. You will need a follow up appointment in:  6 months.  Please call our office 2 months in advance to schedule this appointment.  You may see Sherren Mocha, MD or one of the following Advanced Practice Providers on your designated Care Team: Richardson Dopp, PA-C [place], [place] . Daune Perch, NP  Any Other Special Instructions Will Be Listed Below (If Applicable).  -As prescribed by Dr. Jenny Reichmann, take Pepcid 20 mg (Famotidine) twice a day (can get over the counter) as well as protonix 40 mg daily for acid reflux.    Gastroesophageal Reflux Disease, Adult Gastroesophageal reflux (GER) happens when acid from the stomach flows up into the tube that connects the mouth and the stomach (esophagus). Normally, food travels down the esophagus and stays in the stomach to be digested. With GER, food and stomach acid sometimes move back up into the  esophagus. You may have a disease called gastroesophageal reflux disease (GERD) if the reflux:  Happens often.  Causes frequent or very bad symptoms.  Causes problems such as damage to the esophagus. When this happens, the esophagus becomes sore and swollen (inflamed). Over time, GERD can make small holes (ulcers) in the lining of the esophagus. What are the causes? This condition is caused by a problem with the muscle between the esophagus and the stomach. When this muscle is weak or not normal, it does not close properly to keep food and acid from coming back up from the stomach. The muscle can be weak because of:  Tobacco use.  Pregnancy.  Having a certain type of hernia (hiatal hernia).  Alcohol use.  Certain foods and drinks, such as coffee, chocolate, onions, and peppermint. What increases the risk? You are more likely to develop this condition if you:  Are overweight.  Have a disease that affects your connective tissue.  Use NSAID medicines. What are the signs or symptoms? Symptoms of this condition include:  Heartburn.  Difficult or painful swallowing.  The feeling of having a lump in the throat.  A bitter taste in the mouth.  Bad breath.  Having a lot of saliva.  Having an upset or bloated stomach.  Belching.  Chest pain. Different conditions can cause chest pain. Make sure you see your doctor if you have chest pain.  Shortness of breath or noisy breathing (wheezing).  Ongoing (chronic) cough or a cough at night.  Wearing away of the surface  of teeth (tooth enamel).  Weight loss. How is this treated? Treatment will depend on how bad your symptoms are. Your doctor may suggest:  Changes to your diet.  Medicine.  Surgery. Follow these instructions at home: Eating and drinking   Follow a diet as told by your doctor. You may need to avoid foods and drinks such as: ? Coffee and tea (with or without caffeine). ? Drinks that contain alcohol. ?  Energy drinks and sports drinks. ? Bubbly (carbonated) drinks or sodas. ? Chocolate and cocoa. ? Peppermint and mint flavorings. ? Garlic and onions. ? Horseradish. ? Spicy and acidic foods. These include peppers, chili powder, curry powder, vinegar, hot sauces, and BBQ sauce. ? Citrus fruit juices and citrus fruits, such as oranges, lemons, and limes. ? Tomato-based foods. These include red sauce, chili, salsa, and pizza with red sauce. ? Fried and fatty foods. These include donuts, french fries, potato chips, and high-fat dressings. ? High-fat meats. These include hot dogs, rib eye steak, sausage, ham, and bacon. ? High-fat dairy items, such as whole milk, butter, and cream cheese.  Eat small meals often. Avoid eating large meals.  Avoid drinking large amounts of liquid with your meals.  Avoid eating meals during the 2-3 hours before bedtime.  Avoid lying down right after you eat.  Do not exercise right after you eat. Lifestyle   Do not use any products that contain nicotine or tobacco. These include cigarettes, e-cigarettes, and chewing tobacco. If you need help quitting, ask your doctor.  Try to lower your stress. If you need help doing this, ask your doctor.  If you are overweight, lose an amount of weight that is healthy for you. Ask your doctor about a safe weight loss goal. General instructions  Pay attention to any changes in your symptoms.  Take over-the-counter and prescription medicines only as told by your doctor. Do not take aspirin, ibuprofen, or other NSAIDs unless your doctor says it is okay.  Wear loose clothes. Do not wear anything tight around your waist.  Raise (elevate) the head of your bed about 6 inches (15 cm).  Avoid bending over if this makes your symptoms worse.  Keep all follow-up visits as told by your doctor. This is important. Contact a doctor if:  You have new symptoms.  You lose weight and you do not know why.  You have trouble  swallowing or it hurts to swallow.  You have wheezing or a cough that keeps happening.  Your symptoms do not get better with treatment.  You have a hoarse voice. Get help right away if:  You have pain in your arms, neck, jaw, teeth, or back.  You feel sweaty, dizzy, or light-headed.  You have chest pain or shortness of breath.  You throw up (vomit) and your throw-up looks like blood or coffee grounds.  You pass out (faint).  Your poop (stool) is bloody or black.  You cannot swallow, drink, or eat. Summary  If a person has gastroesophageal reflux disease (GERD), food and stomach acid move back up into the esophagus and cause symptoms or problems such as damage to the esophagus.  Treatment will depend on how bad your symptoms are.  Follow a diet as told by your doctor.  Take all medicines only as told by your doctor. This information is not intended to replace advice given to you by your health care provider. Make sure you discuss any questions you have with your health care provider. Document Released: 04/22/2008  Document Revised: 05/13/2018 Document Reviewed: 05/13/2018 Elsevier Patient Education  [place].

## 2019-07-09 ENCOUNTER — Other Ambulatory Visit: Payer: Self-pay | Admitting: Cardiovascular Disease

## 2019-07-13 ENCOUNTER — Ambulatory Visit: Payer: Medicare HMO | Admitting: Nurse Practitioner

## 2019-07-13 ENCOUNTER — Encounter: Payer: Self-pay | Admitting: Nurse Practitioner

## 2019-07-13 VITALS — BP 112/52 | HR 52 | Temp 97.5°F | Ht 69.5 in | Wt 154.0 lb

## 2019-07-13 DIAGNOSIS — R1013 Epigastric pain: Secondary | ICD-10-CM

## 2019-07-13 DIAGNOSIS — R112 Nausea with vomiting, unspecified: Secondary | ICD-10-CM | POA: Diagnosis not present

## 2019-07-13 NOTE — Progress Notes (Signed)
Chief Complaint:    Upper abdominal discomfort  IMPRESSION and PLAN:    83. 85 year old male with 3 to 4-week history of almost constant vague upper abdominal symptoms despite high-dose acid blocker therapy. Feels "sick" on stomach. Feels if he could vomit it would help.  Initially he was having burning / stabbing epigastric pain but that improved with Mylanta which he is still taking.  Tired of upper abdominal "sickness" and wants something done about it.  Saw Cardiology 07/08/19 with nausea, chest / epigastric discomfort which they felt was non-cardiac. Other than these symptoms patient feels relatively well and stays active with housework and yardwork.  -We will proceed with EGD. The risks and benefits of EGD were discussed and the patient agrees to proceed.  -I encouraged him to try Zofran in the interim, he has some at home but hasn't taken it out of concern for potential side effects.  -Continue antireflux regimen for now -If EGD unremarkable and symptoms persist consider abdominal imaging  2.  Low blood pressure.  BP 112/52.  I rechecked it myself after our visit,  it was 112/58 which is below his baseline.  Patient does feel a little weak.  He will PCP and/or Cardiologist today to discuss BP medications.  If not feeling well or problems with low BP persists then we will need to postpone EGD.   Patient understands this and will let us know   HPI:     Patient is an 85 year old male DM 2, hypertension, CAD / DES stent 2010 and PTCA of D1 2018, SVT, carotid artery stenosis, and adenomatous colon polyps.  Patient is known to Dr. Fuller Plan. He has been seen here multiple times for evaluation of gas, bloating, loose stool, lower abdominal pain, and GERD. I recommended VSL#3 probiotics at one of our visits and he says it really helped the bloating.   Mr. Deasis was last seen a year ago.  Since then he has done pretty well from a GI standpoint.  Approximately 3 weeks ago he developed burning  / stabbing epigastric pain with associated nausea.  The pain did not radiate, it was definitely worse with eating.  He has started taking Mylanta with improvement / near resolution of pain but he has been left with  A vague nausea discomfort.  Feels like if he could just burp or vomit symptoms would resolve.  He was on Prevacid for years, PCP discontinued it 2 weeks ago and started Pepcid 20 mg twice daily and Protonix once daily.  He has not noticed any improvement in symptoms.  Patient has Zofran on home med list but after reading about the side effects he was scared to take it.  Patient is not a user of NSAIDs other than a daily baby aspirin.  Liver test and CBC early July were normal.  His weight is down about 6 pounds over the last few months.  Patient is tired of having these symptoms on a daily basis and wants something done   Data Reviewed:  05/26/2019 Liver test normal CBC normal  Review of systems:      no SOB, no fevers, no urinary sx   Past Medical History:  Diagnosis Date  . Adenomatous polyp of colon 05/2004  . Allergic rhinitis   . Anxiety   . BPH (benign prostatic hyperplasia)   . CAD (coronary artery disease)    s/p Cypher DES to LAD and pD1 2010;  LHC was done 6/12: EF 65%, circumflex patent,  mid RCA 80-90% (small and nondominant), LAD and diagonal stents patent, LAD at the origin of the first diagonal 30%, ostial D2 90%, mid 80-90% (small vessel).  Continued medical therapy was recommended.   . Carotid stenosis    dopplers 02/2012: 2-29% RICA; 79-89% LICA  . Diabetes type 2, controlled ([place])   . Diverticulosis    colon  . ED (erectile dysfunction)   . Esophageal stricture   . GERD (gastroesophageal reflux disease)   . Hemorrhoids   . Hiatal hernia   . Hyperlipidemia   . Hypertension   . IBS (irritable bowel syndrome)   . Right inguinal hernia    s/p repair in 8/12  . SVT (supraventricular tachycardia) ([facility])     Patient's surgical history, family medical history,  social history, medications and allergies were all reviewed in Epic   Current Outpatient Medications  Medication Sig Dispense Refill  . ACCU-CHEK AVIVA PLUS test strip USE FOR TESTING UP TO 4 TIMES DAILY AS DIRECTED 100 each 2  . amLODipine (NORVASC) 5 MG tablet TAKE 1TAB BY MOUTH DAILY. MAKE YEARLY APPT WITH DR.COOPER FOR AUGUST FOR FUTURE REFILLS. 1ST ATTEMPT 90 tablet 1  . aspirin EC 81 MG tablet Take 81 mg by mouth daily after supper.    Marland Kitchen atorvastatin (LIPITOR) 10 MG tablet TAKE 1 TABLET (10 MG TOTAL) BY MOUTH DAILY AT 6 PM. 90 tablet 1  . blood glucose meter kit and supplies Dispense based on patient and insurance preference. Use up to four times daily as directed. (FOR ICD-10 E10.9, E11.9). 1 each 0  . Blood Glucose Monitoring Suppl (ONE TOUCH ULTRA 2) w/Device KIT Use as directed E 11.9 1 each 0  . brimonidine (ALPHAGAN) 0.2 % ophthalmic solution ADMINISTER 1 DROP TO BOTH EYES THREE (3) TIMES A DAY.  3  . carvedilol (COREG) 6.25 MG tablet TAKE 1 TABLET (6.25 MG TOTAL) BY MOUTH 2 (TWO) TIMES DAILY WITH A MEAL. 180 tablet 0  . famotidine (PEPCID) 20 MG tablet Take 1 tablet (20 mg total) by mouth 2 (two) times daily. 180 tablet 3  . fexofenadine (HM FEXOFENADINE HCL) 60 MG tablet Take 1 tablet (60 mg total) by mouth 2 (two) times daily as needed for allergies or rhinitis. 60 tablet 11  . gabapentin (NEURONTIN) 300 MG capsule 1 - 2 tab by mouth at bedtime as needed 180 capsule 1  . glipiZIDE (GLUCOTROL XL) 5 MG 24 hr tablet TAKE 1 TABLET BY MOUTH EVERY DAY WITH BREAKFAST 90 tablet 1  . Lancets MISC Use as directed twice daily E11.9 200 each 3  . latanoprost (XALATAN) 0.005 % ophthalmic solution Place 1 drop into both eyes at bedtime.  12  . losartan (COZAAR) 100 MG tablet Take 100 mg by mouth daily.    . meclizine (ANTIVERT) 12.5 MG tablet TAKE 1 TABLET (12.5 MG TOTAL) BY MOUTH 3 (THREE) TIMES DAILY AS NEEDED FOR DIZZINESS. 30 tablet 2  . metFORMIN (GLUCOPHAGE-XR) 500 MG 24 hr tablet Take 3  tablets (1,500 mg total) by mouth daily with breakfast. 270 tablet 3  . nitroGLYCERIN (NITROSTAT) 0.4 MG SL tablet Place 1 tablet (0.4 mg total) under the tongue every 5 (five) minutes as needed for chest pain. 40 tablet 1  . ondansetron (ZOFRAN) 4 MG tablet Take 1 tablet (4 mg total) by mouth every 8 (eight) hours as needed for nausea or vomiting. 30 tablet 1  . Probiotic Product (VSL#3 PO) Take by mouth.    . traZODone (DESYREL) 50 MG tablet  TAKE 0.5-1 TABLETS BY MOUTH AT BEDTIME AS NEEDED FOR SLEEP. 90 tablet 1  . triamcinolone cream (KENALOG) 0.1 % Apply 1 application topically 2 (two) times daily. 60 g 1  . Vitamin D, Ergocalciferol, (DRISDOL) 1.25 MG (50000 UT) CAPS capsule Take 1 capsule (50,000 Units total) by mouth every 7 (seven) days. 12 capsule 0   No current facility-administered medications for this visit.     Physical Exam:     BP (!) 112/52   Pulse (!) 52   Temp (!) 97.5 F (36.4 C)   Ht 5' 9.5" (1.765 m)   Wt 154 lb (69.9 kg)   BMI 22.42 kg/m   GENERAL:  Pleasant well developed male in NAD PSYCH: : Cooperative, normal affect EENT:  conjunctiva pink, mucous membranes moist, neck supple without masses CARDIAC:  RRR,  no peripheral edema PULM: Normal respiratory effort, lungs CTA bilaterally, no wheezing ABDOMEN:  Nondistended, soft, nontender. No obvious masses, no hepatomegaly,  normal bowel sounds SKIN:  turgor, no lesions seen Musculoskeletal:  Normal muscle tone, normal strength NEURO: Alert and oriented x 3, no focal neurologic deficits   [name] , NP 07/13/2019, 3:22 PM

## 2019-07-13 NOTE — Patient Instructions (Signed)
If you are age 83 or older, your body mass index should be between 23-30. Your Body mass index is 22.42 kg/m. If this is out of the aforementioned range listed, please consider follow up with your Primary Care Provider.  If you are age 75 or younger, your body mass index should be between 19-25. Your Body mass index is 22.42 kg/m. If this is out of the aformentioned range listed, please consider follow up with your Primary Care Provider.   You have been scheduled for an endoscopy. Please follow written instructions given to you at your visit today. If you use inhalers (even only as needed), please bring them with you on the day of your procedure. Your physician has requested that you go to www.startemmi.com and enter the access code given to you at your visit today. This web site gives a general overview about your procedure. However, you should still follow specific instructions given to you by our office regarding your preparation for the procedure.  Thank you for choosing me and [facility].   [name], NP

## 2019-07-14 NOTE — Progress Notes (Signed)
Reviewed and agree with management plan.  Shamir Sedlar T. Willadean Guyton, MD FACG [facility]  

## 2019-07-16 ENCOUNTER — Other Ambulatory Visit: Payer: Self-pay | Admitting: Internal Medicine

## 2019-07-19 ENCOUNTER — Ambulatory Visit (INDEPENDENT_AMBULATORY_CARE_PROVIDER_SITE_OTHER): Payer: Medicare HMO | Admitting: Internal Medicine

## 2019-07-19 ENCOUNTER — Telehealth: Payer: Self-pay

## 2019-07-19 ENCOUNTER — Encounter: Payer: Self-pay | Admitting: Internal Medicine

## 2019-07-19 ENCOUNTER — Other Ambulatory Visit: Payer: Self-pay

## 2019-07-19 VITALS — BP 140/70 | HR 57 | Temp 97.7°F | Ht 69.5 in | Wt 153.0 lb

## 2019-07-19 DIAGNOSIS — R42 Dizziness and giddiness: Secondary | ICD-10-CM | POA: Diagnosis not present

## 2019-07-19 DIAGNOSIS — R1013 Epigastric pain: Secondary | ICD-10-CM | POA: Diagnosis not present

## 2019-07-19 DIAGNOSIS — Z23 Encounter for immunization: Secondary | ICD-10-CM

## 2019-07-19 DIAGNOSIS — R001 Bradycardia, unspecified: Secondary | ICD-10-CM

## 2019-07-19 MED ORDER — NITROGLYCERIN 0.4 MG SL SUBL: 0.4000 mg | SUBLINGUAL_TABLET | SUBLINGUAL | 1 refills | Status: DC | PRN

## 2019-07-19 MED ORDER — CARVEDILOL 3.125 MG PO TABS: 3.1250 mg | ORAL_TABLET | Freq: Two times a day (BID) | ORAL | 3 refills | Status: DC

## 2019-07-19 NOTE — Progress Notes (Signed)
   Subjective:   Patient ID: Keith Herrera, male    DOB: 03/17/1932, 85 y.o.   MRN: [id number]  HPI The patient is an 85 YO man coming in for dizziness and lightheadedness. Prior notes from PCP, cardiology, and GI reviewed within last two weeks. Briefly he has fairly advanced CAD and is having nausea and pain in the low chest upper abdomen with eating. Separately today he had severe dizziness to the point of almost blacking out in the morning. He felt this during the day and called for visit. Denies passing out. Denies chest pains. Has pain everytime after eating within 30 minutes. Is scheduled for EGD later this week. BP running okay here was a little low last week. He has not taken nitroglycerin for the abdomen pain after eating.  PMH, [facility], social history reviewed and updated  Review of Systems  Constitutional: Positive for activity change, appetite change and fatigue.  HENT: Negative.   Eyes: Negative.   Respiratory: Positive for chest tightness. Negative for cough and shortness of breath.   Cardiovascular: Positive for chest pain. Negative for palpitations and leg swelling.  Gastrointestinal: Positive for abdominal pain and nausea. Negative for abdominal distention, anal bleeding, blood in stool, constipation, diarrhea, rectal pain and vomiting.  Musculoskeletal: Negative.   Skin: Negative.   Neurological: Positive for dizziness and light-headedness. Negative for syncope and weakness.       Pre-syncope  Psychiatric/Behavioral: Negative.     Objective:  Physical Exam Constitutional:      Appearance: He is well-developed.     Comments: Chronically ill appearing  HENT:     Head: Normocephalic and atraumatic.  Neck:     Musculoskeletal: Normal range of motion.  Cardiovascular:     Rate and Rhythm: Normal rate and regular rhythm.  Pulmonary:     Effort: Pulmonary effort is normal. No respiratory distress.     Breath sounds: Normal breath sounds. No wheezing or rales.  Abdominal:      General: Bowel sounds are normal. There is no distension.     Palpations: Abdomen is soft.     Tenderness: There is no abdominal tenderness. There is no rebound.  Skin:    General: Skin is warm and dry.  Neurological:     Mental Status: He is alert and oriented to person, place, and time.     Coordination: Coordination normal.     Comments: Dizziness reproduced during orthostatic maneuvers.      Vitals:   07/19/19 1416  BP: 140/70  Pulse: (!) 57  Temp: 97.7 F (36.5 C)  TempSrc: Oral  SpO2: 97%  Weight: 153 lb (69.4 kg)  Height: 5' 9.5" (1.765 m)   Orthostatic vital signs reviewed with pulse dropping to 33 with standing 3 minutes and patient got dizziness during that episode  EKG: Rate 63, more frequent PVCs than prior, low voltage, no st or t wave changes, sinus, no heart block appreciated, stable from prior EKG 03/2019  Flu shot given at visit  Assessment & Plan:  Visit time 40 minutes: greater than 50% of that time was spent in face to face counseling and coordination of care with the patient: counseled about various etiologies of his several concerns as well as potential need for more workup and treatment for his symptoms at this time and potential need to delay EGD.

## 2019-07-19 NOTE — Telephone Encounter (Signed)
Instructed patient to DECREASE COREG to 3.125 mg BID. Zio ordered to be mailed to his home. Instructed the patient to hold off on EGD for now. He was grateful for assistance and agrees with treatment plan.

## 2019-07-19 NOTE — Assessment & Plan Note (Signed)
I am concerned that his recurrent abdominal pain with eating could be related to mesenteric blockages rather than GERD. I would recommend delay EGD until heart monitor or dosage adjustment with cardiology due to the bradycardia with standing during office visit. He may benefit more from abdomen angio to assess blood vessels given his severe CAD.

## 2019-07-19 NOTE — Telephone Encounter (Signed)
-----   Message from Sherren Mocha, MD sent at 07/19/2019  4:42 PM EDT ----- Marykay Lex can you cut his coreg to 3.124 mg BID, arrange a 3 day Zio, and have him delay EGD until we sort out his rhythm issues?  Thx

## 2019-07-19 NOTE — Assessment & Plan Note (Signed)
Concerning with HR to 33 with standing in the office with reproduction of symptoms. Will ask his cardiologist if he needs dose adjustment of amlodipine/coreg as he is on 2 nodal blocking agents. I would also be concerned that he may need repeat holter monitor to assess for intermittent heart block or frequent bradycardia.

## 2019-07-20 ENCOUNTER — Telehealth: Payer: Self-pay | Admitting: Internal Medicine

## 2019-07-20 NOTE — Telephone Encounter (Signed)
Communicated with patient's cardiologist and EP doctor and they will arrange the heart monitor. They have advised him to reduce his coreg to 3.125 mg twice a day (current dosing was 6.25 mg BID). They have sent in new prescription for this dosing or he can take 1/2 pill twice a day. They want him to delay endoscopy until after heart workup so will include Dr. Fuller Plan as fyi on this as he is scheduled for EGD Wednesday Sept 2, 2020 and this will need to be delayed potentially a week or two.

## 2019-07-20 NOTE — Telephone Encounter (Signed)
Keith Herrera, Cancel EGD as requested and please have patient call to reschedule when cleared by Cardiology.

## 2019-07-20 NOTE — Telephone Encounter (Signed)
Pt called and stated that [facility] sent a letter that states that medication metFORMIN could cause harm to pt health. Pt states that he has stopped taking medication. Pt would like a call back as soon as possible. Please advise

## 2019-07-20 NOTE — Telephone Encounter (Signed)
Called patient and informed of MD response patient stated understanding. Stated will pick up new prescription and wait for the call in regard to the heart monitor.

## 2019-07-20 NOTE — Telephone Encounter (Signed)
Patient has already cancelled EGD for tomorrow. Confirmed with patient that he needs cardiology work up before rescheduling. Patient verbalized understanding.

## 2019-07-21 ENCOUNTER — Encounter: Payer: Medicare HMO | Admitting: Gastroenterology

## 2019-07-21 ENCOUNTER — Telehealth: Payer: Self-pay | Admitting: *Deleted

## 2019-07-21 MED ORDER — METFORMIN HCL 500 MG PO TABS: ORAL_TABLET | ORAL | 3 refills | Status: DC

## 2019-07-21 NOTE — Telephone Encounter (Signed)
Thanks for your inquiry about the concern related to a possible cancer causing contaminant to the metformin ER.   We will simply need to change your metformin ER to the regular metformin as there has been no concern about this with the regular metformin.  We will send a new prescription, and simply start this when you are able instead.   

## 2019-07-21 NOTE — Telephone Encounter (Signed)
Pt has been informed.

## 2019-07-21 NOTE — Telephone Encounter (Signed)
3 day ZIO XT long term holter monitor to be mailed to the patients home.  Instructions reviewed briefly as they are included in the monitor kit. 

## 2019-07-26 ENCOUNTER — Ambulatory Visit (INDEPENDENT_AMBULATORY_CARE_PROVIDER_SITE_OTHER): Payer: Medicare HMO

## 2019-07-26 DIAGNOSIS — R001 Bradycardia, unspecified: Secondary | ICD-10-CM | POA: Diagnosis not present

## 2019-08-04 ENCOUNTER — Other Ambulatory Visit: Payer: Self-pay | Admitting: Cardiovascular Disease

## 2019-08-04 MED ORDER — CARVEDILOL 3.125 MG PO TABS: 3.1250 mg | ORAL_TABLET | Freq: Two times a day (BID) | ORAL | 3 refills | Status: DC

## 2019-08-04 MED ORDER — AMLODIPINE BESYLATE 5 MG PO TABS: ORAL_TABLET | ORAL | 3 refills | Status: DC

## 2019-08-06 DIAGNOSIS — R001 Bradycardia, unspecified: Secondary | ICD-10-CM | POA: Diagnosis not present

## 2019-08-09 ENCOUNTER — Telehealth: Payer: Self-pay | Admitting: Cardiovascular Disease

## 2019-08-09 ENCOUNTER — Other Ambulatory Visit: Payer: Self-pay

## 2019-08-09 NOTE — Telephone Encounter (Signed)
New message   Patient states that he mailed his monitor back in and is checking to see if the results of the heart monitor are in. Please call.

## 2019-08-10 ENCOUNTER — Other Ambulatory Visit: Payer: Self-pay | Admitting: Internal Medicine

## 2019-08-10 MED ORDER — GLIPIZIDE ER 5 MG PO TB24: ORAL_TABLET | ORAL | 1 refills | Status: DC

## 2019-08-10 MED ORDER — METFORMIN HCL 500 MG PO TABS: ORAL_TABLET | ORAL | 3 refills | Status: DC

## 2019-08-10 MED ORDER — LOSARTAN POTASSIUM 100 MG PO TABS: 100.0000 mg | ORAL_TABLET | Freq: Every day | ORAL | 1 refills | Status: DC

## 2019-08-10 NOTE — Telephone Encounter (Signed)
Routing to CMA 

## 2019-08-10 NOTE — Telephone Encounter (Signed)
Copied from [place] [phone]. Topic: Quick Communication - Rx Refill/Question >> Aug 10, 2019  1:49 PM Izola Price, [place] A wrote: Medication: glipiZIDE (GLUCOTROL XL) 5 MG 24 hr tablet,losartan (COZAAR) 100 MG tablet ,metFORMIN (GLUCOPHAGE) 500 MG tablet ([employer] is requesting medication be sent over)  Has the patient contacted their pharmacy?Yes (Agent: If no, request that the patient contact the pharmacy for the refill.) (Agent: If yes, when and what did the pharmacy advise?Contact PCP  Preferred Pharmacy (with phone number or street name): [facility], [place] [phone] (Phone) [phone] (Fax)    Agent: Please be advised that RX refills may take up to 3 business days. We ask that you follow-up with your pharmacy.

## 2019-08-10 NOTE — Telephone Encounter (Signed)
Requested medication (s) are due for refill today: yes  Requested medication (s) are on the active medication list: yes  Last refill:  06/03/2019  Future visit scheduled: yes  Notes to clinic:  Review for refill   Requested Prescriptions  Pending Prescriptions Disp Refills   glipiZIDE (GLUCOTROL XL) 5 MG 24 hr tablet 90 tablet 1    Sig: TAKE 1 TABLET BY MOUTH EVERY DAY WITH BREAKFAST     Endocrinology:  Diabetes - Sulfonylureas Passed - 08/10/2019  1:57 PM      Passed - HBA1C is between 0 and 7.9 and within 180 days    Hgb A1c MFr Bld  Date Value Ref Range Status  05/26/2019 7.1 (H) 4.6 - 6.5 % Final    Comment:    Glycemic Control Guidelines for People with Diabetes:Non Diabetic:  <6%Goal of Therapy: <7%Additional Action Suggested:  >8%          Passed - Valid encounter within last 6 months    Recent Outpatient Visits          3 weeks ago [place], Elizabeth A, MD   1 month ago Nausea   [facility] -Georges Mouse, MD   2 months ago Preventative health care   [facility] -Georges Mouse, MD   3 months ago [place], James W, MD   7 months ago [place], James W, MD      Future Appointments            In 3 months Jenny Reichmann, Hunt Oris, MD [place], [facility]            metFORMIN (GLUCOPHAGE) 500 MG tablet 270 tablet 3    Sig: 2 tab by mouth in the AM, and 1 in the PM     Endocrinology:  Diabetes - Biguanides Failed - 08/10/2019  1:57 PM      Failed - eGFR in normal range and within 360 days    GFR calc Af Amer  Date Value Ref Range Status  03/27/2018 78 >59 mL/min/1.73 Final   GFR calc non Af Amer  Date Value Ref Range Status  03/27/2018 67 >59 mL/min/1.73 Final   GFR  Date Value Ref Range Status  05/26/2019 59.22 (L) >60.00 mL/min Final         Passed - Cr  in normal range and within 360 days    Creatinine, Ser  Date Value Ref Range Status  05/26/2019 1.17 0.40 - 1.50 mg/dL Final         Passed - HBA1C is between 0 and 7.9 and within 180 days    Hgb A1c MFr Bld  Date Value Ref Range Status  05/26/2019 7.1 (H) 4.6 - 6.5 % Final    Comment:    Glycemic Control Guidelines for People with Diabetes:Non Diabetic:  <6%Goal of Therapy: <7%Additional Action Suggested:  >8%          Passed - Valid encounter within last 6 months    Recent Outpatient Visits          3 weeks ago [facility] -Chuck Hint, MD   1 month ago Nausea   [facility] -Georges Mouse, MD   2 months ago Preventative health care   [facility] -  Georges Mouse, MD   3 months ago [place], James W, MD   7 months ago [place] John, James W, MD      Future Appointments            In 3 months [name], MD [facility]            losartan (COZAAR) 100 MG tablet       Sig: Take 1 tablet (100 mg total) by mouth daily.     Cardiovascular:  Angiotensin Receptor Blockers Failed - 08/10/2019  1:57 PM      Failed - Last BP in normal range    BP Readings from Last 1 Encounters:  07/19/19 140/70         Passed - Cr in normal range and within 180 days    Creatinine, Ser  Date Value Ref Range Status  05/26/2019 1.17 0.40 - 1.50 mg/dL Final         Passed - K in normal range and within 180 days    Potassium  Date Value Ref Range Status  05/26/2019 4.2 3.5 - 5.1 mEq/L Final         Passed - Patient is not pregnant      Passed - Valid encounter within last 6 months    Recent Outpatient Visits          3 weeks ago [facility] -Chuck Hint, MD   1 month ago Nausea   [facility] -Junie Bame,  Hunt Oris, MD   2 months ago Preventative health care   [facility] -Georges Mouse, MD   3 months ago [place], James W, MD   7 months ago [facility] -Junie Bame, Hunt Oris, MD      Future Appointments            In 3 months Jenny Reichmann, Hunt Oris, MD [place], [facility]

## 2019-08-11 DIAGNOSIS — H401333 Pigmentary glaucoma, bilateral, severe stage: Secondary | ICD-10-CM | POA: Diagnosis not present

## 2019-08-11 NOTE — Telephone Encounter (Signed)
Left message for patient that the monitor has been received but Dr. Burt Knack has not yet reviewed it.  Informed him he will be called as soon as Dr. Burt Knack sends results to Nursing.

## 2019-08-12 ENCOUNTER — Other Ambulatory Visit: Payer: Self-pay | Admitting: Internal Medicine

## 2019-08-22 ENCOUNTER — Other Ambulatory Visit: Payer: Self-pay | Admitting: Internal Medicine

## 2019-08-23 ENCOUNTER — Other Ambulatory Visit: Payer: Self-pay | Admitting: Internal Medicine

## 2019-08-23 NOTE — Telephone Encounter (Signed)
Informed patient of results. He was grateful for assistance.

## 2019-08-23 NOTE — Telephone Encounter (Signed)
° ° °  Please call with monitor reults

## 2019-08-23 NOTE — Telephone Encounter (Signed)
Follow-up:  Daughter of the patient called and states that the patient has still not heard anything about his monitor results. Please call to discuss

## 2019-08-23 NOTE — Telephone Encounter (Signed)
Discussed with Dr. Burt Knack. The patient will stay on current medications and is OK to proceed with colonoscopy if desired.

## 2019-08-25 ENCOUNTER — Telehealth: Payer: Self-pay

## 2019-08-25 NOTE — Telephone Encounter (Signed)
Med list has been faxed  Copied from [place] [phone]. Topic: General - Other >> Aug 24, 2019 11:58 AM Yvette Rack wrote: Reason for CRM: Pt requests that a list of his medications be sent to [facility] because they will not fill his prescriptions until they have a list of current medications. Pt asked that the list of medication be sent to [phone]

## 2019-08-26 ENCOUNTER — Other Ambulatory Visit: Payer: Self-pay | Admitting: Internal Medicine

## 2019-09-14 DIAGNOSIS — R351 Nocturia: Secondary | ICD-10-CM | POA: Diagnosis not present

## 2019-09-14 DIAGNOSIS — N401 Enlarged prostate with lower urinary tract symptoms: Secondary | ICD-10-CM | POA: Diagnosis not present

## 2019-09-29 ENCOUNTER — Telehealth: Payer: Self-pay | Admitting: Cardiovascular Disease

## 2019-09-29 NOTE — Telephone Encounter (Signed)
Left message to call back  

## 2019-09-29 NOTE — Telephone Encounter (Signed)
Patient is currently schedule with Dr. Burt Knack for 01/28/20, he would like a sooner appt.  He is also scheduled with Richardson Dopp on 11/25.   He would like to speak to Dr. Antionette Char nurse.

## 2019-09-30 NOTE — Telephone Encounter (Signed)
The patient complains he is a "little more tired than usual" and sometimes gets "a little dizzy." He is worried his HR is low again and is causing the symptoms.  When he checks his HR, it is always around 70 bpm.  Reviewed monitor results from a couple months ago with patient and reiterated to him his average HR was 60.  Encouraged him to stay hydrated and move slowly when moving positions, especially when lying to standing.  He is scheduled to see Richardson Dopp 11/25 for evaluation. He will call prior to that time if symptoms worsen.

## 2019-10-13 ENCOUNTER — Ambulatory Visit: Payer: Medicare HMO | Admitting: Physician Assistant

## 2019-11-22 NOTE — Progress Notes (Signed)
Cardiology Office Note:    Date:  11/23/2019   ID:  Keith Herrera, DOB 1932-11-12, MRN [id number]  PCP:  [name], MD  Cardiologist:  Sherren Mocha, MD  Electrophysiologist:  Cristopher Peru, MD   Referring MD: [name], MD   Chief Complaint  Patient presents with  . Follow-up    CAD, PVCs    History of Present Illness:    Keith Herrera is a 86 y.o. male with:   Coronary artery disease   S/p DES to LAD and D1 in 2010  S/p POBA to D1 in 11/2016 (no stent due to prior PCI)  Carotid artery disease  PSVT  EPS in 2016 >> no inducible SVT  PVCs  Symptomatic; eval by Dr. Lovena Le in past (tx options limited due to CAD)  Monitor 07/2019: PVC burden 21.6%  Hypertension   Hyperlipidemia   Diabetes mellitus 2   GERD   Esophageal stricture  Prior hx of chest pain of GI origin   Keith Herrera was last seen by Daune Perch, NP in 06/2019.  He was noted to have bradycardia recently and a Zio patch monitor showed sinus rhythm and frequent PVCs with burden of 21.6%.  He has seen EP in the past and noted to have limitations from bradycardia and AAD therapy is limited by the presence of CAD.  Medical Rx was continued.  He called in 09/2019 with low HRs again.  He returns for follow-up.  Overall, he is doing well.  He has not had syncope or near syncope.  He continues to work on his farm.  He is able to push a load of wood in a wheelbarrow without difficulty.  He occasionally notes shortness of breath.  He has not had chest discomfort.  He has not had orthopnea, paroxysmal nocturnal dyspnea or leg swelling.    Prior CV studies:   The following studies were reviewed today:  LT 3-14 day monitor 07/2019 1) the basic rhythm is normal sinus with an average HR of 60 bpm 2) there are occasional PAC's with short supraventricular runs, longest 12 beats 3) frequent PVC's with PVC burden of 21.6%, but no ventricular runs 4) no pathologic pauses 5) no atrial fibrillation or  flutter  48 Hr Holter 12/2016 1. NSR and sinus bradycardia 2. Frequent PAC's and rare PVC's 3. Daytime sinus bradycardia is present with HR's down to 40/min 4. Very brief NS atrial and NS ventricular tachycardia 5. No long pauses observed.  Cardiac catheterization 12/10/16 LAD prox stent patent; D1 ost stent patent then 80; D1 ost 75 LCx irregs RCA mid 100 (L-R collats) PCI: POBA to D1  Echocardiogram 12/09/2016 EF 65-70, no RWMA, Gr 1 DD, mild TR  Carotid US 04/2016 Bilat 1-39; normal subclavians  Myoview 06/10/14 Low risk stress nuclear study Normal myocardial perfusion with no evidence of ischemia.  EKG showed baseline ST abnormality with 74m of horizontal to upsloping ST segment depression during exercise.  Fair exercise tolerance.   LV Ejection Fraction: 71%.  LV Wall Motion:  NL LV Function; NL Wall Motion  Past Medical History:  Diagnosis Date  . Adenomatous polyp of colon 05/2004  . Allergic rhinitis   . Anxiety   . BPH (benign prostatic hyperplasia)   . CAD (coronary artery disease)    s/p Cypher DES to LAD and pD1 2010;  LHC was done 6/12: EF 65%, circumflex patent, mid RCA 80-90% (small and nondominant), LAD and diagonal stents patent, LAD at the origin of  the first diagonal 30%, ostial D2 90%, mid 80-90% (small vessel).  Continued medical therapy was recommended.   . Carotid stenosis    dopplers 02/2012: 3-08% RICA; 65-78% LICA  . Diabetes type 2, controlled (Keith Herrera)   . Diverticulosis    colon  . ED (erectile dysfunction)   . Esophageal stricture   . GERD (gastroesophageal reflux disease)   . Hemorrhoids   . Hiatal hernia   . Hyperlipidemia   . Hypertension   . IBS (irritable bowel syndrome)   . Right inguinal hernia    s/p repair in 8/12  . SVT (supraventricular tachycardia) (HCC)    Surgical Hx: The patient  has a past surgical history that includes Coronary stent placement (10/2008); Inguinal hernia repair (Right); Cardiac catheterization (N/A, 03/20/2015);  Cardiac catheterization (N/A, 12/10/2016); Cardiac catheterization (N/A, 12/10/2016); and Cardiac catheterization (N/A, 12/10/2016).   Current Medications: Current Meds  Medication Sig  . ACCU-CHEK AVIVA PLUS test strip USE FOR TESTING UP TO 4 TIMES DAILY AS DIRECTED  . aspirin EC 81 MG tablet Take 81 mg by mouth daily after supper.  Marland Kitchen atorvastatin (LIPITOR) 10 MG tablet TAKE 1 TABLET (10 MG TOTAL) BY MOUTH DAILY AT 6 PM.  . blood glucose meter kit and supplies Dispense based on patient and insurance preference. Use up to four times daily as directed. (FOR ICD-10 E10.9, E11.9).  Marland Kitchen Blood Glucose Monitoring Suppl (ONE TOUCH ULTRA 2) w/Device KIT Use as directed E 11.9  . brimonidine (ALPHAGAN) 0.2 % ophthalmic solution ADMINISTER 1 DROP TO BOTH EYES THREE (3) TIMES A DAY.  . carvedilol (COREG) 3.125 MG tablet Take 1 tablet (3.125 mg total) by mouth 2 (two) times daily with a meal.  . famotidine (PEPCID) 20 MG tablet Take 1 tablet (20 mg total) by mouth 2 (two) times daily.  . fexofenadine (HM FEXOFENADINE HCL) 60 MG tablet Take 1 tablet (60 mg total) by mouth 2 (two) times daily as needed for allergies or rhinitis.  Marland Kitchen gabapentin (NEURONTIN) 300 MG capsule 1 - 2 tab by mouth at bedtime as needed  . glipiZIDE (GLUCOTROL XL) 5 MG 24 hr tablet TAKE 1 TABLET BY MOUTH EVERY DAY WITH BREAKFAST  . Lancets MISC Use as directed twice daily E11.9  . latanoprost (XALATAN) 0.005 % ophthalmic solution Place 1 drop into both eyes at bedtime.  Marland Kitchen linagliptin (TRADJENTA) 5 MG TABS tablet Take 5 mg by mouth daily.  Marland Kitchen losartan (COZAAR) 100 MG tablet TAKE 1 TABLET EVERY DAY  . meclizine (ANTIVERT) 12.5 MG tablet TAKE 1 TABLET (12.5 MG TOTAL) BY MOUTH 3 (THREE) TIMES DAILY AS NEEDED FOR DIZZINESS.  . metFORMIN (GLUCOPHAGE) 500 MG tablet 2 tab by mouth in the AM, and 1 in the PM  . nitroGLYCERIN (NITROSTAT) 0.4 MG SL tablet Place 1 tablet (0.4 mg total) under the tongue every 5 (five) minutes as needed for chest pain.  Marland Kitchen  ondansetron (ZOFRAN) 4 MG tablet Take 1 tablet (4 mg total) by mouth every 8 (eight) hours as needed for nausea or vomiting.  . Probiotic Product (VSL#3 PO) Take by mouth.  . traZODone (DESYREL) 50 MG tablet TAKE 0.5-1 TABLETS BY MOUTH AT BEDTIME AS NEEDED FOR SLEEP.  Marland Kitchen triamcinolone cream (KENALOG) 0.1 % APPLY TO AFFECTED AREA TWICE A DAY  . Vitamin D, Ergocalciferol, (DRISDOL) 1.25 MG (50000 UT) CAPS capsule Take 1 capsule (50,000 Units total) by mouth every 7 (seven) days.  . [DISCONTINUED] amLODipine (NORVASC) 5 MG tablet TAKE 1 TAB BY MOUTH DAILY.  Allergies:   Crestor [rosuvastatin calcium] and Lovastatin   Social History   Tobacco Use  . Smoking status: Former Smoker    Types: Cigarettes    Quit date: 03/05/1956    Years since quitting: 63.7  . Smokeless tobacco: Never Used  . Tobacco comment: smoked in teens  Substance Use Topics  . Alcohol use: No    Alcohol/week: 0.0 standard drinks  . Drug use: No     Family Hx: The patient's family history includes Bladder Cancer (age of onset: 46) in his mother; Healthy in his daughter and son; Heart attack (age of onset: 81) in his father; Pancreatic cancer (age of onset: 57) in his sister. There is no history of Colon cancer.  ROS:   Please see the history of present illness.    ROS All other systems reviewed and are negative.   EKGs/Labs/Other Test Reviewed:    EKG:  EKG is  ordered today.  The ekg ordered today demonstrates sinus rhythm, PVCs w/ ventricular bigeminy, non-specific ST-TW changes, QTc 425  Recent Labs: 05/26/2019: ALT 15; BUN 23; Creatinine, Ser 1.17; Hemoglobin 15.1; Platelets 182.0; Potassium 4.2; Sodium 140; TSH 2.33   Recent Lipid Panel Lab Results  Component Value Date/Time   CHOL 114 05/26/2019 11:12 AM   TRIG 120.0 05/26/2019 11:12 AM   TRIG 128 11/19/2006 10:32 AM   HDL 32.70 (L) 05/26/2019 11:12 AM   CHOLHDL 3 05/26/2019 11:12 AM   LDLCALC 58 05/26/2019 11:12 AM   LDLDIRECT 82.0 05/19/2018 10:58  AM    Physical Exam:    VS:  BP (!) 160/52   Pulse 66   Ht 5' 9.5" (1.765 m)   Wt 159 lb (72.1 kg)   SpO2 99%   BMI 23.14 kg/m     Wt Readings from Last 3 Encounters:  11/23/19 159 lb (72.1 kg)  07/19/19 153 lb (69.4 kg)  07/13/19 154 lb (69.9 kg)     Physical Exam  Constitutional: He is oriented to person, place, and time. He appears well-developed and well-nourished. No distress.  HENT:  Head: Normocephalic and atraumatic.  Eyes: No scleral icterus.  Neck: No JVD present. No thyromegaly present.  Cardiovascular: Normal rate and normal heart sounds. A regularly irregular rhythm present.  No murmur heard. Pulmonary/Chest: Effort normal and breath sounds normal. He has no rales.  Abdominal: Soft. There is no hepatomegaly.  Musculoskeletal:        General: No edema.  Lymphadenopathy:    He has no cervical adenopathy.  Neurological: He is alert and oriented to person, place, and time.  Skin: Skin is warm and dry.  Psychiatric: He has a normal mood and affect.    ASSESSMENT & PLAN:    1. Coronary artery disease involving native coronary artery of native heart without angina pectoris History of drug-eluting stent to the LAD and drug-eluting stent to the diagonal in 2010 and subsequent balloon angioplasty to the diagonal in 2018.  Overall, he is doing well without anginal symptoms.  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  Continue aspirin, atorvastatin.  2. PVC's (premature ventricular contractions) ECG today demonstrates ventricular bigeminy.  Recent monitor demonstrated significant PVC burden (21.6%).  Options for treatment have been limited due to bradycardia as well as his underlying CAD.  Overall, he does not seem to be symptomatic.    3. Essential (primary) hypertension Blood pressure elevated.  He has not been taking amlodipine.  He notes that when he was switched from brand name to generic,  his blood pressures were not well controlled.  We will try to get Norvasc filled  for him.  Continue current dose of amlodipine, carvedilol, losartan.  4. Hyperlipidemia LDL goal <70 LDL optimal on most recent lab work.  Continue current Rx.     Dispo:  Return in 2 months (on 01/28/2020) for Scheduled Follow Up, w/ Dr. Burt Knack.   Medication Adjustments/Labs and Tests Ordered: Current medicines are reviewed at length with the patient today.  Concerns regarding medicines are outlined above.  Tests Ordered: Orders Placed This Encounter  Procedures  . EKG 12-Lead cs   Medication Changes: Meds ordered this encounter  Medications  . amLODipine (NORVASC) 5 MG tablet    Sig: Take 1 tablet (5 mg total) by mouth daily.    Dispense:  90 tablet    Refill:  3    Needs brand name.    Signed, Richardson Dopp, PA-C  11/23/2019 11:33 AM    [facility] [address], [place], [place]  [address] Phone: [phone]; Fax: [phone]

## 2019-11-23 ENCOUNTER — Telehealth: Payer: Self-pay | Admitting: Physician Assistant

## 2019-11-23 ENCOUNTER — Ambulatory Visit: Payer: Medicare HMO | Admitting: Physician Assistant

## 2019-11-23 ENCOUNTER — Encounter: Payer: Self-pay | Admitting: Physician Assistant

## 2019-11-23 ENCOUNTER — Other Ambulatory Visit: Payer: Self-pay

## 2019-11-23 VITALS — BP 160/52 | HR 66 | Ht 69.5 in | Wt 159.0 lb

## 2019-11-23 DIAGNOSIS — E119 Type 2 diabetes mellitus without complications: Secondary | ICD-10-CM | POA: Diagnosis not present

## 2019-11-23 DIAGNOSIS — E785 Hyperlipidemia, unspecified: Secondary | ICD-10-CM

## 2019-11-23 DIAGNOSIS — I493 Ventricular premature depolarization: Secondary | ICD-10-CM

## 2019-11-23 DIAGNOSIS — I2510 Atherosclerotic heart disease of native coronary artery without angina pectoris: Secondary | ICD-10-CM

## 2019-11-23 DIAGNOSIS — I10 Essential (primary) hypertension: Secondary | ICD-10-CM | POA: Diagnosis not present

## 2019-11-23 MED ORDER — AMLODIPINE BESYLATE 5 MG PO TABS: 5.0000 mg | ORAL_TABLET | Freq: Every day | ORAL | 3 refills | Status: DC

## 2019-11-23 NOTE — Telephone Encounter (Signed)
Please call the patient. I reviewed his case with Dr. Burt Knack. We would like to get an echocardiogram done on him.  I discussed this with him earlier today at his office visit.  He has an appt with Dr. Burt Knack in March 2021.  Please arrange an echocardiogram about 1 week prior to his office visit in March.  The results will be reviewed at his appointment. Dx: PVCs, CAD Richardson Dopp, PA-C    11/23/2019 4:17 PM

## 2019-11-23 NOTE — Patient Instructions (Signed)
Medication Instructions:   *If you need a refill on your cardiac medications before your next appointment, please call your pharmacy*  Lab Work:  If you have labs (blood work) drawn today and your tests are completely normal, you will receive your results only by: Marland Kitchen MyChart Message (if you have MyChart) OR . A paper copy in the mail If you have any lab test that is abnormal or we need to change your treatment, we will call you to review the results.  Testing/Procedures: None ordered today.  Follow-Up: At [facility], you and your health needs are our priority.  As part of our continuing mission to provide you with exceptional heart care, we have created designated Provider Care Teams.  These Care Teams include your primary Cardiologist (physician) and Advanced Practice Providers (APPs -  Physician Assistants and Nurse Practitioners) who all work together to provide you with the care you need, when you need it.  Your next appointment:   March 2021  The format for your next appointment:   In Person  Provider:   You may see Sherren Mocha, MD or one of the following Advanced Practice Providers on your designated Care Team:    Richardson Dopp, PA-C  Vin Wayland, [place]  Daune Perch, [place]

## 2019-11-24 NOTE — Telephone Encounter (Signed)
I called and left patient a message to call back to set up echocardiogram before appointment with Dr. Burt Knack in March.

## 2019-11-26 ENCOUNTER — Ambulatory Visit: Payer: Medicare HMO | Admitting: Internal Medicine

## 2019-11-30 ENCOUNTER — Ambulatory Visit (INDEPENDENT_AMBULATORY_CARE_PROVIDER_SITE_OTHER): Payer: Medicare HMO | Admitting: Internal Medicine

## 2019-11-30 ENCOUNTER — Encounter: Payer: Self-pay | Admitting: Internal Medicine

## 2019-11-30 ENCOUNTER — Other Ambulatory Visit: Payer: Self-pay

## 2019-11-30 VITALS — BP 146/72 | HR 52 | Temp 98.0°F | Ht 69.5 in | Wt 159.0 lb

## 2019-11-30 DIAGNOSIS — K219 Gastro-esophageal reflux disease without esophagitis: Secondary | ICD-10-CM | POA: Diagnosis not present

## 2019-11-30 DIAGNOSIS — E1159 Type 2 diabetes mellitus with other circulatory complications: Secondary | ICD-10-CM

## 2019-11-30 DIAGNOSIS — Z0000 Encounter for general adult medical examination without abnormal findings: Secondary | ICD-10-CM

## 2019-11-30 DIAGNOSIS — E559 Vitamin D deficiency, unspecified: Secondary | ICD-10-CM

## 2019-11-30 LAB — URINALYSIS, ROUTINE W REFLEX MICROSCOPIC
Bilirubin Urine: NEGATIVE
Hgb urine dipstick: NEGATIVE
Ketones, ur: NEGATIVE
Leukocytes,Ua: NEGATIVE
Nitrite: NEGATIVE
RBC / HPF: NONE SEEN (ref 0–?)
Specific Gravity, Urine: 1.025 (ref 1.000–1.030)
Total Protein, Urine: NEGATIVE
Urine Glucose: 500 — AB
Urobilinogen, UA: 0.2 (ref 0.0–1.0)
WBC, UA: NONE SEEN (ref 0–?)
pH: 5 (ref 5.0–8.0)

## 2019-11-30 LAB — MICROALBUMIN / CREATININE URINE RATIO
Creatinine,U: 101.8 mg/dL
Microalb Creat Ratio: 3.5 mg/g (ref 0.0–30.0)
Microalb, Ur: 3.5 mg/dL — ABNORMAL HIGH (ref 0.0–1.9)

## 2019-11-30 LAB — HEPATIC FUNCTION PANEL
ALT: 21 U/L (ref 0–53)
AST: 16 U/L (ref 0–37)
Albumin: 4.2 g/dL (ref 3.5–5.2)
Alkaline Phosphatase: 49 U/L (ref 39–117)
Bilirubin, Direct: 0.1 mg/dL (ref 0.0–0.3)
Total Bilirubin: 0.5 mg/dL (ref 0.2–1.2)
Total Protein: 6.4 g/dL (ref 6.0–8.3)

## 2019-11-30 LAB — HEMOGLOBIN A1C: Hgb A1c MFr Bld: 7.5 % — ABNORMAL HIGH (ref 4.6–6.5)

## 2019-11-30 LAB — CBC WITH DIFFERENTIAL/PLATELET
Basophils Absolute: 0 10*3/uL (ref 0.0–0.1)
Basophils Relative: 0.6 % (ref 0.0–3.0)
Eosinophils Absolute: 0.2 10*3/uL (ref 0.0–0.7)
Eosinophils Relative: 2.6 % (ref 0.0–5.0)
HCT: 44.2 % (ref 39.0–52.0)
Hemoglobin: 14.6 g/dL (ref 13.0–17.0)
Lymphocytes Relative: 18.7 % (ref 12.0–46.0)
Lymphs Abs: 1.6 10*3/uL (ref 0.7–4.0)
MCHC: 33.2 g/dL (ref 30.0–36.0)
MCV: 90.7 fl (ref 78.0–100.0)
Monocytes Absolute: 0.7 10*3/uL (ref 0.1–1.0)
Monocytes Relative: 8.5 % (ref 3.0–12.0)
Neutro Abs: 5.8 10*3/uL (ref 1.4–7.7)
Neutrophils Relative %: 69.6 % (ref 43.0–77.0)
Platelets: 184 10*3/uL (ref 150.0–400.0)
RBC: 4.87 Mil/uL (ref 4.22–5.81)
RDW: 13.5 % (ref 11.5–15.5)
WBC: 8.4 10*3/uL (ref 4.0–10.5)

## 2019-11-30 LAB — BASIC METABOLIC PANEL
BUN: 24 mg/dL — ABNORMAL HIGH (ref 6–23)
CO2: 27 mEq/L (ref 19–32)
Calcium: 9.4 mg/dL (ref 8.4–10.5)
Chloride: 106 mEq/L (ref 96–112)
Creatinine, Ser: 1.03 mg/dL (ref 0.40–1.50)
GFR: 68.52 mL/min (ref >60.00)
Glucose, Bld: 191 mg/dL — ABNORMAL HIGH (ref 70–99)
Potassium: 4.3 mEq/L (ref 3.5–5.1)
Sodium: 140 mEq/L (ref 135–145)

## 2019-11-30 LAB — LIPID PANEL
Cholesterol: 117 mg/dL (ref 0–200)
HDL: 34.4 mg/dL — ABNORMAL LOW (ref >39.00)
LDL Cholesterol: 59 mg/dL (ref 0–99)
NonHDL: 82.21
Total CHOL/HDL Ratio: 3
Triglycerides: 116 mg/dL (ref 0.0–149.0)
VLDL: 23.2 mg/dL (ref 0.0–40.0)

## 2019-11-30 LAB — VITAMIN D 25 HYDROXY (VIT D DEFICIENCY, FRACTURES): VITD: 32.97 ng/mL (ref 30.00–100.00)

## 2019-11-30 LAB — TSH: TSH: 2.15 u[IU]/mL (ref 0.35–4.50)

## 2019-11-30 MED ORDER — LINAGLIPTIN 5 MG PO TABS: ORAL_TABLET | ORAL | 3 refills | Status: DC

## 2019-11-30 MED ORDER — PANTOPRAZOLE SODIUM 40 MG PO TBEC: 40.0000 mg | DELAYED_RELEASE_TABLET | Freq: Every day | ORAL | 3 refills | Status: DC

## 2019-11-30 NOTE — Progress Notes (Signed)
Subjective:    Patient ID: Keith Herrera, male    DOB: 1932-07-20, 86 y.o.   MRN: [id number]  HPI  Here for wellness and f/u;  Overall doing ok;  Pt denies Chest pain, worsening SOB, DOE, wheezing, orthopnea, PND, worsening LE edema, palpitations, dizziness or syncope.  Pt denies neurological change such as new headache, facial or extremity weakness.  Pt denies polydipsia, polyuria, or low sugar symptoms. Pt states overall good compliance with treatment and medications, good tolerability, and has been trying to follow appropriate diet.  Pt denies worsening depressive symptoms, suicidal ideation or panic. No fever, night sweats, wt loss, loss of appetite, or other constitutional symptoms.  Pt states good ability with ADL's, has low fall risk, home safety reviewed and adequate, no other significant changes in hearing or vision, and only occasionally active with exercise. Not taking the trajenta last 3 mo due to donutt hole $400 per month, but now back to $40/mo. Due to see cards in March, and BB cut in half with low HR.  Does have midl worsening gerd where pepcid no longer working too well.  Past Medical History:  Diagnosis Date  . Adenomatous polyp of colon 05/2004  . Allergic rhinitis   . Anxiety   . BPH (benign prostatic hyperplasia)   . CAD (coronary artery disease)    s/p Cypher DES to LAD and pD1 2010;  LHC was done 6/12: EF 65%, circumflex patent, mid RCA 80-90% (small and nondominant), LAD and diagonal stents patent, LAD at the origin of the first diagonal 30%, ostial D2 90%, mid 80-90% (small vessel).  Continued medical therapy was recommended.   . Carotid stenosis    dopplers 02/2012: 1-09% RICA; 32-35% LICA  . Diabetes type 2, controlled ([place])   . Diverticulosis    colon  . ED (erectile dysfunction)   . Esophageal stricture   . GERD (gastroesophageal reflux disease)   . Hemorrhoids   . Hiatal hernia   . Hyperlipidemia   . Hypertension   . IBS (irritable bowel syndrome)   .  Right inguinal hernia    s/p repair in 8/12  . SVT (supraventricular tachycardia) (HCC)    Past Surgical History:  Procedure Laterality Date  . CARDIAC CATHETERIZATION N/A 12/10/2016   Procedure: Left Heart Cath and Coronary Angiography;  Surgeon: Jettie Booze, MD;  Location: [place] [facility];  Service: Cardiovascular;  Laterality: N/A;  . CARDIAC CATHETERIZATION N/A 12/10/2016   Procedure: Coronary Balloon Angioplasty;  Surgeon: Jettie Booze, MD;  Location: [place] [facility];  Service: Cardiovascular;  Laterality: N/A;  . CARDIAC CATHETERIZATION N/A 12/10/2016   Procedure: Intravascular Pressure Wire/FFR Study;  Surgeon: Jettie Booze, MD;  Location: [facility];  Service: Cardiovascular;  Laterality: N/A;  . CORONARY STENT PLACEMENT  10/2008   x 2  . ELECTROPHYSIOLOGIC STUDY N/A 03/20/2015   no inducible SVT - Dr Lovena Le  . INGUINAL HERNIA REPAIR Right    RIH with ultrapro patch    reports that he quit smoking about 63 years ago. His smoking use included cigarettes. He has never used smokeless tobacco. He reports that he does not drink alcohol or use drugs. family history includes Bladder Cancer (age of onset: 38) in his mother; Healthy in his daughter and son; Heart attack (age of onset: 21) in his father; Pancreatic cancer (age of onset: 39) in his sister. Allergies  Allergen Reactions  . Crestor [Rosuvastatin Calcium] Rash    All over body  .  Lovastatin Itching and Rash   Current Outpatient Medications on File Prior to Visit  Medication Sig Dispense Refill  . ACCU-CHEK AVIVA PLUS test strip USE FOR TESTING UP TO 4 TIMES DAILY AS DIRECTED 100 strip 2  . amLODipine (NORVASC) 5 MG tablet Take 1 tablet (5 mg total) by mouth daily. 90 tablet 3  . aspirin EC 81 MG tablet Take 81 mg by mouth daily after supper.    Marland Kitchen atorvastatin (LIPITOR) 10 MG tablet TAKE 1 TABLET (10 MG TOTAL) BY MOUTH DAILY AT 6 PM. 90 tablet 1  . blood glucose meter kit and supplies Dispense  based on patient and insurance preference. Use up to four times daily as directed. (FOR ICD-10 E10.9, E11.9). 1 each 0  . Blood Glucose Monitoring Suppl (ONE TOUCH ULTRA 2) w/Device KIT Use as directed E 11.9 1 each 0  . brimonidine (ALPHAGAN) 0.2 % ophthalmic solution ADMINISTER 1 DROP TO BOTH EYES THREE (3) TIMES A DAY.  3  . carvedilol (COREG) 3.125 MG tablet Take 1 tablet (3.125 mg total) by mouth 2 (two) times daily with a meal. 180 tablet 3  . famotidine (PEPCID) 20 MG tablet Take 1 tablet (20 mg total) by mouth 2 (two) times daily. 180 tablet 3  . fexofenadine (HM FEXOFENADINE HCL) 60 MG tablet Take 1 tablet (60 mg total) by mouth 2 (two) times daily as needed for allergies or rhinitis. 60 tablet 11  . gabapentin (NEURONTIN) 300 MG capsule 1 - 2 tab by mouth at bedtime as needed 180 capsule 1  . glipiZIDE (GLUCOTROL XL) 5 MG 24 hr tablet TAKE 1 TABLET BY MOUTH EVERY DAY WITH BREAKFAST 90 tablet 1  . Lancets MISC Use as directed twice daily E11.9 200 each 3  . latanoprost (XALATAN) 0.005 % ophthalmic solution Place 1 drop into both eyes at bedtime.  12  . losartan (COZAAR) 100 MG tablet TAKE 1 TABLET EVERY DAY 90 tablet 1  . meclizine (ANTIVERT) 12.5 MG tablet TAKE 1 TABLET (12.5 MG TOTAL) BY MOUTH 3 (THREE) TIMES DAILY AS NEEDED FOR DIZZINESS. 30 tablet 2  . metFORMIN (GLUCOPHAGE) 500 MG tablet 2 tab by mouth in the AM, and 1 in the PM 270 tablet 3  . nitroGLYCERIN (NITROSTAT) 0.4 MG SL tablet Place 1 tablet (0.4 mg total) under the tongue every 5 (five) minutes as needed for chest pain. 5 tablet 1  . ondansetron (ZOFRAN) 4 MG tablet Take 1 tablet (4 mg total) by mouth every 8 (eight) hours as needed for nausea or vomiting. 30 tablet 1  . Probiotic Product (VSL#3 PO) Take by mouth.    . traZODone (DESYREL) 50 MG tablet TAKE 0.5-1 TABLETS BY MOUTH AT BEDTIME AS NEEDED FOR SLEEP. 90 tablet 1  . triamcinolone cream (KENALOG) 0.1 % APPLY TO AFFECTED AREA TWICE A DAY 60 g 1  . Vitamin D,  Ergocalciferol, (DRISDOL) 1.25 MG (50000 UT) CAPS capsule Take 1 capsule (50,000 Units total) by mouth every 7 (seven) days. 12 capsule 0   No current facility-administered medications on file prior to visit.    Review of Systems Constitutional: Negative for other unusual diaphoresis, sweats, appetite or weight changes HENT: Negative for other worsening hearing loss, ear pain, facial swelling, mouth sores or neck stiffness.   Eyes: Negative for other worsening pain, redness or other visual disturbance.  Respiratory: Negative for other stridor or swelling Cardiovascular: Negative for other palpitations or other chest pain  Gastrointestinal: Negative for worsening diarrhea or loose stools, blood  in stool, distention or other pain Genitourinary: Negative for hematuria, flank pain or other change in urine volume.  Musculoskeletal: Negative for myalgias or other joint swelling.  Skin: Negative for other color change, or other wound or worsening drainage.  Neurological: Negative for other syncope or numbness. Hematological: Negative for other adenopathy or swelling Psychiatric/Behavioral: Negative for hallucinations, other worsening agitation, SI, self-injury, or new decreased concentration All otherwise neg per pt     Objective:   Physical Exam BP (!) 146/72   Pulse (!) 52   Temp 98 F (36.7 C)   Ht 5' 9.5" (1.765 m)   Wt 159 lb (72.1 kg)   SpO2 98%   BMI 23.14 kg/m  VS noted,  Constitutional: Pt appears in NAD HENT: Head: NCAT.  Right Ear: External ear normal.  Left Ear: External ear normal.  Eyes: . Pupils are equal, round, and reactive to light. Conjunctivae and EOM are normal Nose: without d/c or deformity Neck: Neck supple. Gross normal ROM Cardiovascular: Normal rate and regular rhythm.   Pulmonary/Chest: Effort normal and breath sounds without rales or wheezing.  Abd:  Soft, NT, ND, + BS, no organomegaly Neurological: Pt is alert. At baseline orientation, motor grossly  intact Skin: Skin is warm. No rashes, other new lesions, no LE edema Psychiatric: Pt behavior is normal without agitation  Lab Results  Component Value Date   WBC 7.2 05/26/2019   HGB 15.1 05/26/2019   HCT 44.9 05/26/2019   PLT 182.0 05/26/2019   GLUCOSE 164 (H) 05/26/2019   CHOL 114 05/26/2019   TRIG 120.0 05/26/2019   HDL 32.70 (L) 05/26/2019   LDLDIRECT 82.0 05/19/2018   LDLCALC 58 05/26/2019   ALT 15 05/26/2019   AST 13 05/26/2019   NA 140 05/26/2019   K 4.2 05/26/2019   CL 104 05/26/2019   CREATININE 1.17 05/26/2019   BUN 23 05/26/2019   CO2 27 05/26/2019   TSH 2.33 05/26/2019   PSA 2.49 12/21/2012   INR 1.15 12/10/2016   HGBA1C 7.1 (H) 05/26/2019   MICROALBUR 1.7 05/26/2019          Assessment & Plan:

## 2019-11-30 NOTE — Assessment & Plan Note (Signed)

## 2019-11-30 NOTE — Addendum Note (Signed)
Addended by: Trenda Moots on: [id number] 11:02 AM   Modules accepted: Orders

## 2019-11-30 NOTE — Assessment & Plan Note (Signed)
Ok to change the pepcid to protonix 40 qd

## 2019-11-30 NOTE — Assessment & Plan Note (Addendum)
Has not been taking the tradjenta, o/w stable overall by history and exam, recent data reviewed with pt, and pt to continue medical treatment as before,  to f/u any worsening symptoms or concerns

## 2019-11-30 NOTE — Patient Instructions (Signed)
Ok to stop the pepcid (famotidine)  Please take all new medication as prescribed - the protonix 40 mg per day for antacid  Please continue all other medications as before, and refills have been done if requested, including the tradjenta restart  Please have the pharmacy call with any other refills you may need.  Please continue your efforts at being more active, low cholesterol diet, and weight control.  You are otherwise up to date with prevention measures today.  Please keep your appointments with your specialists as you may have planned  Please go to the LAB at the blood drawing area for the tests to be done  You will be contacted by phone if any changes need to be made immediately.  Otherwise, you will receive a letter about your results with an explanation, but please check with MyChart first.  Please remember to sign up for MyChart if you have not done so, as this will be important to you in the future with finding out test results, communicating by private email, and scheduling acute appointments online when needed.  Please return in 6 months, or sooner if needed

## 2019-12-07 NOTE — Telephone Encounter (Signed)
I called and left patient a message to call back and set up echocardiogram before follow up with Dr. Burt Knack.

## 2019-12-08 NOTE — Telephone Encounter (Signed)
Patient calling back to schedule his echocardiogram, but he does not have an order for it yet.

## 2019-12-09 NOTE — Telephone Encounter (Signed)
I returned call to patient, he will come in for an echocardiogram on 01/20/20. Orders in.

## 2019-12-15 DIAGNOSIS — H5319 Other subjective visual disturbances: Secondary | ICD-10-CM | POA: Diagnosis not present

## 2019-12-15 DIAGNOSIS — H21233 Degeneration of iris (pigmentary), bilateral: Secondary | ICD-10-CM | POA: Diagnosis not present

## 2019-12-15 DIAGNOSIS — Z961 Presence of intraocular lens: Secondary | ICD-10-CM | POA: Diagnosis not present

## 2019-12-21 ENCOUNTER — Other Ambulatory Visit: Payer: Self-pay | Admitting: Physician Assistant

## 2019-12-21 ENCOUNTER — Other Ambulatory Visit: Payer: Self-pay | Admitting: Internal Medicine

## 2019-12-21 DIAGNOSIS — D485 Neoplasm of uncertain behavior of skin: Secondary | ICD-10-CM | POA: Diagnosis not present

## 2019-12-21 DIAGNOSIS — L280 Lichen simplex chronicus: Secondary | ICD-10-CM | POA: Diagnosis not present

## 2019-12-21 DIAGNOSIS — L309 Dermatitis, unspecified: Secondary | ICD-10-CM | POA: Diagnosis not present

## 2019-12-31 DIAGNOSIS — N401 Enlarged prostate with lower urinary tract symptoms: Secondary | ICD-10-CM | POA: Diagnosis not present

## 2019-12-31 DIAGNOSIS — R350 Frequency of micturition: Secondary | ICD-10-CM | POA: Diagnosis not present

## 2020-01-04 DIAGNOSIS — L308 Other specified dermatitis: Secondary | ICD-10-CM | POA: Diagnosis not present

## 2020-01-07 ENCOUNTER — Other Ambulatory Visit: Payer: Self-pay | Admitting: *Deleted

## 2020-01-07 MED ORDER — ATORVASTATIN CALCIUM 10 MG PO TABS: 10.0000 mg | ORAL_TABLET | Freq: Every day | ORAL | 1 refills | Status: DC

## 2020-01-20 ENCOUNTER — Other Ambulatory Visit: Payer: Self-pay

## 2020-01-20 ENCOUNTER — Ambulatory Visit (HOSPITAL_COMMUNITY): Payer: Medicare HMO | Attending: Cardiology

## 2020-01-20 DIAGNOSIS — I493 Ventricular premature depolarization: Secondary | ICD-10-CM | POA: Diagnosis not present

## 2020-01-20 DIAGNOSIS — I2510 Atherosclerotic heart disease of native coronary artery without angina pectoris: Secondary | ICD-10-CM

## 2020-01-21 ENCOUNTER — Encounter: Payer: Self-pay | Admitting: Physician Assistant

## 2020-01-21 DIAGNOSIS — I493 Ventricular premature depolarization: Secondary | ICD-10-CM

## 2020-01-21 HISTORY — DX: Ventricular premature depolarization: I49.3

## 2020-01-28 ENCOUNTER — Encounter: Payer: Self-pay | Admitting: Cardiovascular Disease

## 2020-01-28 ENCOUNTER — Ambulatory Visit: Payer: Medicare HMO | Admitting: Cardiovascular Disease

## 2020-01-28 ENCOUNTER — Other Ambulatory Visit: Payer: Self-pay

## 2020-01-28 VITALS — BP 158/60 | HR 55 | Ht 70.0 in | Wt 158.8 lb

## 2020-01-28 DIAGNOSIS — I10 Essential (primary) hypertension: Secondary | ICD-10-CM

## 2020-01-28 DIAGNOSIS — I2510 Atherosclerotic heart disease of native coronary artery without angina pectoris: Secondary | ICD-10-CM

## 2020-01-28 DIAGNOSIS — I493 Ventricular premature depolarization: Secondary | ICD-10-CM | POA: Diagnosis not present

## 2020-01-28 NOTE — Patient Instructions (Signed)
Medication Instructions:  Your provider recommends that you continue on your current medications as directed. Please refer to the Current Medication list given to you today.   *If you need a refill on your cardiac medications before your next appointment, please call your pharmacy*  Follow-Up: At [facility], you and your health needs are our priority.  As part of our continuing mission to provide you with exceptional heart care, we have created designated Provider Care Teams.  These Care Teams include your primary Cardiologist (physician) and Advanced Practice Providers (APPs -  Physician Assistants and Nurse Practitioners) who all work together to provide you with the care you need, when you need it. Your next appointment:   6 month(s) The format for your next appointment:   In Person Provider:   Scott Weaver, PA-C 

## 2020-01-28 NOTE — Progress Notes (Signed)
Cardiology Office Note:    Date:  01/28/2020   ID:  Keith Herrera, DOB December 20, 1931, MRN [id number]  PCP:  [name], MD  Cardiologist:  Sherren Mocha, MD  Electrophysiologist:  Cristopher Peru, MD   Referring MD: [name], MD   Chief Complaint  Patient presents with  . Coronary Artery Disease    History of Present Illness:    Keith Herrera is a 86 y.o. male with a hx of:  Coronary artery disease  ? S/p DES to LAD and D1 in 2010 ? S/p POBA to D1 in 11/2016 (no stent due to prior PCI)  Carotid artery disease  PSVT ? EPS in 2016 >> no inducible SVT  PVCs ? Symptomatic; eval by Dr. Lovena Le in past (tx options limited due to CAD) ? Monitor 07/2019: PVC burden 21.6%  Hypertension   Hyperlipidemia   Diabetes mellitus 2   GERD   Esophageal stricture ? Prior hx of chest pain of GI origin  He is here alone today. Biggest complaint is lack of energy. States that sometimes he doesn't feel like getting up and doing anything. He denies shortness of breath or chest pain/pressure. No lightheadedness or syncope. He has had both coronavirus vaccine shots now 3-4 weeks out from his second shot.  He recently had an echocardiogram which is copied below, demonstrating normal LV systolic function with evidence of diastolic dysfunction.  There is no significant valvular disease.  He has a high PVC burden of greater than 20% seen on previous event monitoring.  Past Medical History:  Diagnosis Date  . Adenomatous polyp of colon 05/2004  . Allergic rhinitis   . Anxiety   . BPH (benign prostatic hyperplasia)   . CAD (coronary artery disease)    s/p Cypher DES to LAD and pD1 2010;  LHC was done 6/12: EF 65%, circumflex patent, mid RCA 80-90% (small and nondominant), LAD and diagonal stents patent, LAD at the origin of the first diagonal 30%, ostial D2 90%, mid 80-90% (small vessel).  Continued medical therapy was recommended.   . Carotid stenosis    dopplers 02/2012: 3-56% RICA;  70-14% LICA  . Diabetes type 2, controlled ([place])   . Diverticulosis    colon  . ED (erectile dysfunction)   . Esophageal stricture   . GERD (gastroesophageal reflux disease)   . Hemorrhoids   . Hiatal hernia   . Hyperlipidemia   . Hypertension   . IBS (irritable bowel syndrome)   . PVC's (premature ventricular contractions) 01/21/2020   Monitor 07/2019: PVC burden 21.6% // Echocardiogram 01/2020: EF 65-70, no RWMA, Gr 2 DD, normal RVSF, RVSP 43.3 mmHg (mod pul HTN), mild LAE, mild MR, trivial AI  . Right inguinal hernia    s/p repair in 8/12  . SVT (supraventricular tachycardia) (HCC)     Past Surgical History:  Procedure Laterality Date  . CARDIAC CATHETERIZATION N/A 12/10/2016   Procedure: Left Heart Cath and Coronary Angiography;  Surgeon: Jettie Booze, MD;  Location: [place] [facility];  Service: Cardiovascular;  Laterality: N/A;  . CARDIAC CATHETERIZATION N/A 12/10/2016   Procedure: Coronary Balloon Angioplasty;  Surgeon: Jettie Booze, MD;  Location: [place] [facility];  Service: Cardiovascular;  Laterality: N/A;  . CARDIAC CATHETERIZATION N/A 12/10/2016   Procedure: Intravascular Pressure Wire/FFR Study;  Surgeon: Jettie Booze, MD;  Location: [place] [facility];  Service: Cardiovascular;  Laterality: N/A;  . CORONARY STENT PLACEMENT  10/2008   x 2  .  ELECTROPHYSIOLOGIC STUDY N/A 03/20/2015   no inducible SVT - Dr Lovena Le  . INGUINAL HERNIA REPAIR Right    RIH with ultrapro patch    Current Medications: Current Meds  Medication Sig  . ACCU-CHEK AVIVA PLUS test strip USE FOR TESTING UP TO 4 TIMES DAILY AS DIRECTED  . amLODipine (NORVASC) 5 MG tablet Take 1 tablet (5 mg total) by mouth daily.  Marland Kitchen aspirin EC 81 MG tablet Take 81 mg by mouth daily after supper.  Marland Kitchen atorvastatin (LIPITOR) 10 MG tablet Take 1 tablet (10 mg total) by mouth daily at 6 PM.  . blood glucose meter kit and supplies Dispense based on patient and insurance preference. Use up to four times  daily as directed. (FOR ICD-10 E10.9, E11.9).  Marland Kitchen Blood Glucose Monitoring Suppl (ONE TOUCH ULTRA 2) w/Device KIT Use as directed E 11.9  . brimonidine (ALPHAGAN) 0.2 % ophthalmic solution ADMINISTER 1 DROP TO BOTH EYES THREE (3) TIMES A DAY.  . carvedilol (COREG) 3.125 MG tablet Take 1 tablet (3.125 mg total) by mouth 2 (two) times daily with a meal.  . gabapentin (NEURONTIN) 300 MG capsule 1 - 2 tab by mouth at bedtime as needed  . glipiZIDE (GLUCOTROL XL) 5 MG 24 hr tablet TAKE 1 TABLET BY MOUTH EVERY DAY WITH BREAKFAST  . Lancets MISC Use as directed twice daily E11.9  . latanoprost (XALATAN) 0.005 % ophthalmic solution Place 1 drop into both eyes at bedtime.  Marland Kitchen linagliptin (TRADJENTA) 5 MG TABS tablet Take 5 mg by mouth daily.  Marland Kitchen losartan (COZAAR) 100 MG tablet TAKE 1 TABLET EVERY DAY  . metFORMIN (GLUCOPHAGE) 500 MG tablet 2 tab by mouth in the AM, and 1 in the PM  . Multiple Vitamins-Minerals (MULTIVITAMIN WITH MINERALS) tablet Take 1 tablet by mouth daily.  . nitroGLYCERIN (NITROSTAT) 0.4 MG SL tablet Place 1 tablet (0.4 mg total) under the tongue every 5 (five) minutes as needed for chest pain.  . pantoprazole (PROTONIX) 40 MG tablet Take 1 tablet (40 mg total) by mouth daily.  Marland Kitchen triamcinolone cream (KENALOG) 0.1 % APPLY TO AFFECTED AREA TWICE A DAY     Allergies:   Crestor [rosuvastatin calcium] and Lovastatin   Social History   Socioeconomic History  . Marital status: Married    Spouse name: Not on file  . Number of children: 3  . Years of education: Not on file  . Highest education level: Not on file  Occupational History  . 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 Smoking status: Former Smoker    Types: Cigarettes    Quit date: 03/05/1956    Years since quitting: 63.9  . Smokeless tobacco: Never Used  . Tobacco comment: smoked in teens  Substance and Sexual Activity  . Alcohol use: No    Alcohol/week: 0.0 standard drinks  . Drug  use: No  . Sexual activity: Not on file  Other Topics Concern  . Not on file  Social History Narrative   Lives with wife in a 3 story home.  Has 2 living children.  1 passed away in a car accident.     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     Social Determinants of Health   Financial Resource Strain:   . Difficulty of Paying Living Expenses:   Food Insecurity:   . Worried About Charity fundraiser in the  Last Year:   . [place] in the Last Year:   Transportation Needs:   . Film/video editor (Medical):   Marland Kitchen Lack of Transportation (Non-Medical):   Physical Activity:   . Days of Exercise per Week:   . Minutes of Exercise per Session:   Stress:   . Feeling of Stress :   Social Connections:   . Frequency of Communication with Friends and Family:   . Frequency of Social Gatherings with Friends and Family:   . Attends Religious Services:   . Active Member of Clubs or Organizations:   . Attends Archivist Meetings:   Marland Kitchen Marital Status:      Family History: The patient's family history includes Bladder Cancer (age of onset: 62) in his mother; Healthy in his daughter and son; Heart attack (age of onset: 65) in his father; Pancreatic cancer (age of onset: 53) in his sister. There is no history of Colon cancer.  ROS:   Please see the history of present illness.    All other systems reviewed and are negative.  EKGs/Labs/Other Studies Reviewed:    The following studies were reviewed today: Echo 01/20/20: IMPRESSIONS    1. Left ventricular ejection fraction, by estimation, is 65 to 70%. The  left ventricle has hyperdynamic function. The left ventricle has no  regional wall motion abnormalities. Left ventricular diastolic parameters  are consistent with Grade II diastolic  dysfunction (pseudonormalization). Elevated left atrial pressure.  2. Right ventricular systolic function is normal. The right  ventricular  size is normal. There is moderately elevated pulmonary artery systolic  pressure. The estimated right ventricular systolic pressure is 27.7 mmHg.  3. Left atrial size was mildly dilated.  4. The mitral valve is normal in structure and function. Mild mitral  valve regurgitation. No evidence of mitral stenosis.  5. The aortic valve is normal in structure and function. Aortic valve  regurgitation is trivial. No aortic stenosis is present.  6. The inferior vena cava is normal in size with greater than 50%  respiratory variability, suggesting right atrial pressure of 3 mmHg.  EKG:  EKG is not ordered today.    Recent Labs: 11/30/2019: ALT 21; BUN 24; Creatinine, Ser 1.03; Hemoglobin 14.6; Platelets 184.0; Potassium 4.3; Sodium 140; TSH 2.15  Recent Lipid Panel    Component Value Date/Time   CHOL 117 11/30/2019 1102   TRIG 116.0 11/30/2019 1102   TRIG 128 11/19/2006 1032   HDL 34.40 (L) 11/30/2019 1102   CHOLHDL 3 11/30/2019 1102   VLDL 23.2 11/30/2019 1102   LDLCALC 59 11/30/2019 1102   LDLDIRECT 82.0 05/19/2018 1058    Physical Exam:    VS:  BP (!) 158/60   Pulse (!) 55   Ht '5\' 10"'  (1.778 m)   Wt 158 lb 12.8 oz (72 kg)   SpO2 90%   BMI 22.79 kg/m     Wt Readings from Last 3 Encounters:  01/28/20 158 lb 12.8 oz (72 kg)  11/30/19 159 lb (72.1 kg)  11/23/19 159 lb (72.1 kg)     GEN:  Well nourished, well developed pleasant elderly male in no acute distress HEENT: Normal NECK: No JVD; No carotid bruits LYMPHATICS: No lymphadenopathy CARDIAC: RRR, with premature beats in a regular pattern suggestive of bigeminy RESPIRATORY:  Clear to auscultation without rales, wheezing or rhonchi  ABDOMEN: Soft, non-tender, non-distended MUSCULOSKELETAL:  No edema; No deformity  SKIN: Warm and dry NEUROLOGIC:  Alert and oriented x 3 PSYCHIATRIC:  Normal  affect   ASSESSMENT:    1. PVC's (premature ventricular contractions)   2. Coronary artery disease involving native  coronary artery of native heart without angina pectoris   3. Essential (primary) hypertension    PLAN:    In order of problems listed above:  1. Treatment limited by bradycardia. Treatment options limited by bradycardia and underlying coronary disease.  Continue low-dose carvedilol.  Fortunately LV function is normal by echo assessment and there is no evidence of PVC mediated cardiomyopathy. 2. Stable without symptoms of angina.  Continue current medical therapy. 3. Continue amlodipine, losartan, and low-dose carvedilol.  Medication Adjustments/Labs and Tests Ordered: Current medicines are reviewed at length with the patient today.  Concerns regarding medicines are outlined above.  No orders of the defined types were placed in this encounter.  No orders of the defined types were placed in this encounter.   Patient Instructions  Medication Instructions:  Your provider recommends that you continue on your current medications as directed. Please refer to the Current Medication list given to you today.   *If you need a refill on your cardiac medications before your next appointment, please call your pharmacy*   Follow-Up: At [facility], you and your health needs are our priority.  As part of our continuing mission to provide you with exceptional heart care, we have created designated Provider Care Teams.  These Care Teams include your primary Cardiologist (physician) and Advanced Practice Providers (APPs -  Physician Assistants and Nurse Practitioners) who all work together to provide you with the care you need, when you need it. Your next appointment:   6 month(s) The format for your next appointment:   In Person Provider:   Richardson Dopp, PA-C     Signed, Sherren Mocha, MD  01/28/2020 12:22 PM    [facility]

## 2020-02-19 ENCOUNTER — Other Ambulatory Visit: Payer: Self-pay | Admitting: Internal Medicine

## 2020-02-21 NOTE — Telephone Encounter (Signed)
Please refill as per office routine med refill policy (all routine meds refilled for 3 mo or monthly per pt preference up to one year from last visit, then month to month grace period for 3 mo, then further med refills will have to be denied)  

## 2020-04-07 ENCOUNTER — Telehealth: Payer: Self-pay | Admitting: Nurse Practitioner

## 2020-04-07 NOTE — Telephone Encounter (Signed)
Patient called states he is having a lot of upper gastric issues please advise

## 2020-04-07 NOTE — Telephone Encounter (Signed)
Wonders if he can get the same  prescription Vici sent for him before.sent to his pharmacy  Today because does not want to go thru the weekend not feeling well.

## 2020-04-07 NOTE — Telephone Encounter (Signed)
Patient on pantoprazole. Began having breakthrough acid indigestion about 2 weeks ago. He has been taking Mylanta twice daily. He now is having a loose bowel movement once daily. Bowels move only once a day. He feels he needs a different stomach pill, that "the pantoprazole is not working." No fever. He is COVID vaccinated. No nausea or vomiting.

## 2020-04-09 NOTE — Telephone Encounter (Signed)
Beth,  I saw him for upper abdominal pain unrelieved with PPI back in Aug. 2020  He was supposed to get an EGD but looks like it was not done.  Please find out what happened with that. If failing PPI then EGD is often warranted.  You can switch him from Protonix to nexium or whichever PPI insurance will cover but he needs to come back in since it has been several months since last visit  Thanks

## 2020-04-10 ENCOUNTER — Other Ambulatory Visit: Payer: Self-pay

## 2020-04-10 MED ORDER — ESOMEPRAZOLE MAGNESIUM 40 MG PO CPDR: 40.0000 mg | DELAYED_RELEASE_CAPSULE | Freq: Every day | ORAL | 1 refills | Status: DC

## 2020-04-10 NOTE — Telephone Encounter (Signed)
Spoke with the patient. He wants to get the VSL again. Researched this for the patient. There is an OTC of this Visbiome that the pharmacy can order for him if he asks. Though it was very expensive, he feels it was helpful in the past and would like to try it again. D/c the Pantoprazole and begin Nexium as per medication list. Follow appointment scheduled. His EGD was canceled due to cardiac issues.

## 2020-04-27 ENCOUNTER — Ambulatory Visit: Payer: Medicare HMO | Admitting: Nurse Practitioner

## 2020-04-27 ENCOUNTER — Other Ambulatory Visit: Payer: Self-pay | Admitting: Internal Medicine

## 2020-04-27 ENCOUNTER — Encounter: Payer: Self-pay | Admitting: Nurse Practitioner

## 2020-04-27 VITALS — BP 122/62 | HR 60 | Ht 70.0 in | Wt 152.4 lb

## 2020-04-27 DIAGNOSIS — R195 Other fecal abnormalities: Secondary | ICD-10-CM

## 2020-04-27 DIAGNOSIS — R634 Abnormal weight loss: Secondary | ICD-10-CM

## 2020-04-27 DIAGNOSIS — K219 Gastro-esophageal reflux disease without esophagitis: Secondary | ICD-10-CM

## 2020-04-27 NOTE — Telephone Encounter (Signed)
Please refill as per office routine med refill policy (all routine meds refilled for 3 mo or monthly per pt preference up to one year from last visit, then month to month grace period for 3 mo, then further med refills will have to be denied)  

## 2020-04-27 NOTE — Progress Notes (Signed)
IMPRESSION and PLAN:    86 yo male with pmh significant for DM2, hypertension, hyperlipidemia , CAD/PCI, adenomatous colon polyps, GERD  # Recurrent loose stool ( chronic, intermittent) --VSL # probiotics worked well for him in the past but then he had a difficult time findings them at pharmacy. Resume VSL # 3,  two bid x 2 weeks then one BID. We located VSL# 3 in some local pharmacies.    # Chest discomfort / "indigestion" --Similar symptoms when I saw him last year. Cardiology didn't feel symptoms were cardiac related. We recommended EGD but it was never done.  Symptoms resolved but he had recurrent symptoms a few weeks ago while on pantoprazole.  He was changed to omeprazole but it made him feel weak.  Symptoms now back under control after resuming pantoprazole and eliminating fried foods --continue pantoprazole 40 mg q am --Continue to avoid trigger foods --Since January his weight has dropped 7 pounds. Follow-up with me in 3 to 4 weeks.  If patient has recurrent upper GI recurrent symptoms then we need to reentertain an EGD  HPI:    Primary GI:  Keith Edward, MD    Chief complaint : indigestion, loose stool   04/07/20 called the office with breakthrough symptoms on Pantoprazole He was having significant chest discomfort, felt Protonix was no longer helping so we changed to Omeprazole. He was asked to come for follow up  Interval history:  Mr. Ryder describes symptoms a few weeks ago as a pressure sensation in epigastric/chest and heartburn. Felt need to belch but couldn't. . The Omeprazole made him dizzy and didn't help heartburn so he went back on Pantoprazole a couple of weeks ago. He also reduced fried food consumption and is feeling a better  Complains of indigestion (feels  needs to burp but can't ). No heartburn or regurgitation. No chest pain nor sob. Patient was seen in Aug 2020 with similar symptoms. He had seen Cardiology at few days prior and symptoms not  felt to be cardiac in nature.   Additionally he hasn't had a solid BM in two months. Having only one unformed stool a day but still has to strain sometimes. Stools not liquid, just unformed. No med changes other than PPI switch but bowel changes preceded bowel change. No recent antibiotics. He has a history of intermittent loose stool and was doing great on VSL# 3 probiotics but hasn't been able to find it.    Review of systems:     No chest pain, no SOB, no fevers, no urinary sx   Past Medical History:  Diagnosis Date  . Adenomatous polyp of colon 05/2004  . Allergic rhinitis   . Anxiety   . BPH (benign prostatic hyperplasia)   . CAD (coronary artery disease)    s/p Cypher DES to LAD and pD1 2010;  LHC was done 6/12: EF 65%, circumflex patent, mid RCA 80-90% (small and nondominant), LAD and diagonal stents patent, LAD at the origin of the first diagonal 30%, ostial D2 90%, mid 80-90% (small vessel).  Continued medical therapy was recommended.   . Carotid stenosis    dopplers 02/2012: 1-57% RICA; 26-20% LICA  . Diabetes type 2, controlled ([place])   . Diverticulosis    colon  . ED (erectile dysfunction)   . Esophageal stricture   . GERD (gastroesophageal reflux disease)   . Hemorrhoids   . Hiatal hernia   . Hyperlipidemia   . Hypertension   .  IBS (irritable bowel syndrome)   . PVC's (premature ventricular contractions) 01/21/2020   Monitor 07/2019: PVC burden 21.6% // Echocardiogram 01/2020: EF 65-70, no RWMA, Gr 2 DD, normal RVSF, RVSP 43.3 mmHg (mod pul HTN), mild LAE, mild MR, trivial AI  . Right inguinal hernia    s/p repair in 8/12  . SVT (supraventricular tachycardia) (HCC)     Patient's surgical history, family medical history, social history, medications and allergies were all reviewed in Epic   Creatinine clearance cannot be calculated (Patient's most recent lab result is older than the maximum 21 days allowed.)  Current Outpatient Medications  Medication Sig Dispense Refill    . ACCU-CHEK AVIVA PLUS test strip USE FOR TESTING UP TO 4 TIMES DAILY AS DIRECTED 100 strip 2  . amLODipine (NORVASC) 5 MG tablet Take 1 tablet (5 mg total) by mouth daily. 90 tablet 3  . aspirin EC 81 MG tablet Take 81 mg by mouth daily after supper.    Marland Kitchen atorvastatin (LIPITOR) 10 MG tablet Take 1 tablet (10 mg total) by mouth daily at 6 PM. 90 tablet 1  . blood glucose meter kit and supplies Dispense based on patient and insurance preference. Use up to four times daily as directed. (FOR ICD-10 E10.9, E11.9). 1 each 0  . Blood Glucose Monitoring Suppl (ONE TOUCH ULTRA 2) w/Device KIT Use as directed E 11.9 1 each 0  . brimonidine (ALPHAGAN) 0.2 % ophthalmic solution ADMINISTER 1 DROP TO BOTH EYES THREE (3) TIMES A DAY.  3  . carvedilol (COREG) 3.125 MG tablet Take 1 tablet (3.125 mg total) by mouth 2 (two) times daily with a meal. 180 tablet 3  . gabapentin (NEURONTIN) 300 MG capsule 1 - 2 tab by mouth at bedtime as needed 180 capsule 1  . glipiZIDE (GLUCOTROL XL) 5 MG 24 hr tablet TAKE 1 TABLET BY MOUTH EVERY DAY WITH BREAKFAST Annual appt due in July must see provider for future refills 90 tablet 0  . Lancets MISC Use as directed twice daily E11.9 200 each 3  . latanoprost (XALATAN) 0.005 % ophthalmic solution Place 1 drop into both eyes at bedtime.  12  . linagliptin (TRADJENTA) 5 MG TABS tablet Take 5 mg by mouth daily. 90 tablet 3  . losartan (COZAAR) 100 MG tablet TAKE 1 TABLET EVERY DAY 90 tablet 1  . metFORMIN (GLUCOPHAGE) 500 MG tablet 2 tab by mouth in the AM, and 1 in the PM 270 tablet 3  . Multiple Vitamins-Minerals (MULTIVITAMIN WITH MINERALS) tablet Take 1 tablet by mouth daily.    . nitroGLYCERIN (NITROSTAT) 0.4 MG SL tablet Place 1 tablet (0.4 mg total) under the tongue every 5 (five) minutes as needed for chest pain. 5 tablet 1  . pantoprazole (PROTONIX) 40 MG tablet Take 1 tablet (40 mg total) by mouth daily. 90 tablet 3   No current facility-administered medications for this  visit.    Physical Exam:     BP 122/62   Pulse 60   Ht '5\' 10"'  (1.778 m)   Wt 152 lb 6.4 oz (69.1 kg)   SpO2 96%   BMI 21.87 kg/m   GENERAL:  Pleasant male in NAD PSYCH: : Cooperative, normal affect CARDIAC:  RRR PULM: Normal respiratory effort, lungs CTA bilaterally, no wheezing ABDOMEN:  Nondistended, soft, nontender. No obvious masses, no hepatomegaly,  normal bowel sounds SKIN:  turgor, no lesions seen Musculoskeletal:  Normal muscle tone, normal strength NEURO: Alert and oriented x 3, no focal neurologic deficits  [name] , NP 04/27/2020, 1:44 PM

## 2020-04-27 NOTE — Patient Instructions (Addendum)
If you are age 84 or older, your body mass index should be between 23-30. Your Body mass index is 21.87 kg/m. If this is out of the aforementioned range listed, please consider follow up with your Primary Care Provider.  If you are age 10 or younger, your body mass index should be between 19-25. Your Body mass index is 21.87 kg/m. If this is out of the aformentioned range listed, please consider follow up with your Primary Care Provider.   Continue Pantoprazole.  VSL # 3 112.5 Billion take two capsules twice daily for 2 weeks then 1 capsule twice daily until follow up visit.   VSL #3 can be found at the following places [place], [facility], [place], [employer], [employer], [employer].   Follow up in 4 weeks

## 2020-04-28 ENCOUNTER — Encounter: Payer: Self-pay | Admitting: Nurse Practitioner

## 2020-04-28 NOTE — Progress Notes (Signed)
Reviewed and agree with management plan.  Klee Kolek T. Yehudit Fulginiti, MD FACG [facility]  

## 2020-05-02 DIAGNOSIS — R351 Nocturia: Secondary | ICD-10-CM | POA: Diagnosis not present

## 2020-05-02 DIAGNOSIS — N401 Enlarged prostate with lower urinary tract symptoms: Secondary | ICD-10-CM | POA: Diagnosis not present

## 2020-05-02 DIAGNOSIS — R3915 Urgency of urination: Secondary | ICD-10-CM | POA: Diagnosis not present

## 2020-05-21 ENCOUNTER — Other Ambulatory Visit: Payer: Self-pay | Admitting: Internal Medicine

## 2020-05-21 NOTE — Telephone Encounter (Signed)
Please refill as per office routine med refill policy (all routine meds refilled for 3 mo or monthly per pt preference up to one year from last visit, then month to month grace period for 3 mo, then further med refills will have to be denied)  

## 2020-05-29 ENCOUNTER — Other Ambulatory Visit: Payer: Self-pay

## 2020-05-29 ENCOUNTER — Ambulatory Visit (INDEPENDENT_AMBULATORY_CARE_PROVIDER_SITE_OTHER): Payer: Medicare HMO | Admitting: Internal Medicine

## 2020-05-29 ENCOUNTER — Ambulatory Visit: Payer: Medicare HMO

## 2020-05-29 ENCOUNTER — Encounter: Payer: Self-pay | Admitting: Internal Medicine

## 2020-05-29 VITALS — BP 110/60 | HR 63 | Temp 97.8°F | Ht 70.0 in | Wt 157.0 lb

## 2020-05-29 DIAGNOSIS — I10 Essential (primary) hypertension: Secondary | ICD-10-CM | POA: Diagnosis not present

## 2020-05-29 DIAGNOSIS — J310 Chronic rhinitis: Secondary | ICD-10-CM | POA: Diagnosis not present

## 2020-05-29 DIAGNOSIS — M722 Plantar fascial fibromatosis: Secondary | ICD-10-CM

## 2020-05-29 DIAGNOSIS — E1159 Type 2 diabetes mellitus with other circulatory complications: Secondary | ICD-10-CM

## 2020-05-29 DIAGNOSIS — E785 Hyperlipidemia, unspecified: Secondary | ICD-10-CM | POA: Diagnosis not present

## 2020-05-29 MED ORDER — TRIAMCINOLONE ACETONIDE 55 MCG/ACT NA AERO: 2.0000 | INHALATION_SPRAY | Freq: Every day | NASAL | 12 refills | Status: DC

## 2020-05-29 MED ORDER — DICLOFENAC SODIUM 1 % EX GEL: 2.0000 g | Freq: Four times a day (QID) | CUTANEOUS | 5 refills | Status: DC

## 2020-05-29 MED ORDER — LEVOCETIRIZINE DIHYDROCHLORIDE 5 MG PO TABS: 5.0000 mg | ORAL_TABLET | Freq: Every evening | ORAL | 3 refills | Status: DC | PRN

## 2020-05-29 MED ORDER — EMPAGLIFLOZIN 10 MG PO TABS: 10.0000 mg | ORAL_TABLET | Freq: Every day | ORAL | 3 refills | Status: DC

## 2020-05-29 NOTE — Assessment & Plan Note (Signed)
Mild, for volt gel prn, shoe inserts, soft soled shoes

## 2020-05-29 NOTE — Progress Notes (Signed)
Subjective:    Patient ID: Keith Herrera, male    DOB: 05-31-1932, 86 y.o.   MRN: [id number]  HPI  Here to f/u with c/o pain to right plantar heel x 2 wks mild, intermittent, worse to get up in the aM and after sitting for some time. Nothing else makes better or worse.  Also c/o left post upper leg pain, mild, sharp, interrmitent, worse to lift the leg off the floor while sitting, nothing else makes better or worse.   Pt denies chest pain, increasing sob or doe, wheezing, orthopnea, PND, increased LE swelling, palpitations, dizziness or syncope.  Pt denies new neurological symptoms such as new headache, or facial or extremity weakness or numbness.  Pt denies polydipsia, polyuria, or low sugar episode.  Pt states overall good compliance with meds, except now the tradjenta is too expensive.  Does have several wks ongoing nasal allergy symptoms with clearish congestion, itch and sneezing, without fever, pain, ST, cough, swelling or wheezing. Past Medical History:  Diagnosis Date  . Adenomatous polyp of colon 05/2004  . Allergic rhinitis   . Anxiety   . BPH (benign prostatic hyperplasia)   . CAD (coronary artery disease)    s/p Cypher DES to LAD and pD1 2010;  LHC was done 6/12: EF 65%, circumflex patent, mid RCA 80-90% (small and nondominant), LAD and diagonal stents patent, LAD at the origin of the first diagonal 30%, ostial D2 90%, mid 80-90% (small vessel).  Continued medical therapy was recommended.   . Carotid stenosis    dopplers 02/2012: 5-09% RICA; 32-67% LICA  . Diabetes type 2, controlled ([place])   . Diverticulosis    colon  . ED (erectile dysfunction)   . Esophageal stricture   . GERD (gastroesophageal reflux disease)   . Hemorrhoids   . Hiatal hernia   . Hyperlipidemia   . Hypertension   . IBS (irritable bowel syndrome)   . PVC's (premature ventricular contractions) 01/21/2020   Monitor 07/2019: PVC burden 21.6% // Echocardiogram 01/2020: EF 65-70, no RWMA, Gr 2 DD, normal RVSF, RVSP  43.3 mmHg (mod pul HTN), mild LAE, mild MR, trivial AI  . Right inguinal hernia    s/p repair in 8/12  . SVT (supraventricular tachycardia) (HCC)    Past Surgical History:  Procedure Laterality Date  . CARDIAC CATHETERIZATION N/A 12/10/2016   Procedure: Left Heart Cath and Coronary Angiography;  Surgeon: Jettie Booze, MD;  Location: [place] [facility];  Service: Cardiovascular;  Laterality: N/A;  . CARDIAC CATHETERIZATION N/A 12/10/2016   Procedure: Coronary Balloon Angioplasty;  Surgeon: Jettie Booze, MD;  Location: [place] [facility];  Service: Cardiovascular;  Laterality: N/A;  . CARDIAC CATHETERIZATION N/A 12/10/2016   Procedure: Intravascular Pressure Wire/FFR Study;  Surgeon: Jettie Booze, MD;  Location: [place] [facility];  Service: Cardiovascular;  Laterality: N/A;  . CORONARY STENT PLACEMENT  10/2008   x 2  . ELECTROPHYSIOLOGIC STUDY N/A 03/20/2015   no inducible SVT - Dr Lovena Le  . INGUINAL HERNIA REPAIR Right    RIH with ultrapro patch    reports that he quit smoking about 64 years ago. His smoking use included cigarettes. He has never used smokeless tobacco. He reports that he does not drink alcohol and does not use drugs. family history includes Bladder Cancer (age of onset: 58) in his mother; Healthy in his daughter and son; Heart attack (age of onset: 61) in his father; Pancreatic cancer (age of onset: 58) in his sister.  Allergies  Allergen Reactions  . Crestor [Rosuvastatin Calcium] Rash    All over body  . Lovastatin Itching and Rash   Current Outpatient Medications on File Prior to Visit  Medication Sig Dispense Refill  . ACCU-CHEK AVIVA PLUS test strip USE FOR TESTING UP TO 4 TIMES DAILY AS DIRECTED 100 strip 2  . amLODipine (NORVASC) 5 MG tablet Take 1 tablet (5 mg total) by mouth daily. 90 tablet 3  . aspirin EC 81 MG tablet Take 81 mg by mouth daily after supper.    Marland Kitchen atorvastatin (LIPITOR) 10 MG tablet Take 1 tablet (10 mg total) by mouth daily  at 6 PM. 90 tablet 1  . blood glucose meter kit and supplies Dispense based on patient and insurance preference. Use up to four times daily as directed. (FOR ICD-10 E10.9, E11.9). 1 each 0  . Blood Glucose Monitoring Suppl (ONE TOUCH ULTRA 2) w/Device KIT Use as directed E 11.9 1 each 0  . brimonidine (ALPHAGAN) 0.2 % ophthalmic solution ADMINISTER 1 DROP TO BOTH EYES THREE (3) TIMES A DAY.  3  . carvedilol (COREG) 3.125 MG tablet Take 1 tablet (3.125 mg total) by mouth 2 (two) times daily with a meal. 180 tablet 3  . finasteride (PROSCAR) 5 MG tablet     . gabapentin (NEURONTIN) 300 MG capsule 1 - 2 tab by mouth at bedtime as needed 180 capsule 1  . GEMTESA 75 MG TABS     . glipiZIDE (GLUCOTROL XL) 5 MG 24 hr tablet TAKE 1 TABLET EVERY DAY WITH BREAKFAST ANNUAL APPT DUE IN JULY MUST SEE PROVIDER FOR FUTURE REFILLS 30 tablet 0  . Lancets MISC Use as directed twice daily E11.9 200 each 3  . latanoprost (XALATAN) 0.005 % ophthalmic solution Place 1 drop into both eyes at bedtime.  12  . linagliptin (TRADJENTA) 5 MG TABS tablet Take 5 mg by mouth daily. 90 tablet 3  . losartan (COZAAR) 100 MG tablet TAKE 1 TABLET EVERY DAY 90 tablet 1  . metFORMIN (GLUCOPHAGE) 500 MG tablet 2 tab by mouth in the AM, and 1 in the PM 270 tablet 3  . nitroGLYCERIN (NITROSTAT) 0.4 MG SL tablet Place 1 tablet (0.4 mg total) under the tongue every 5 (five) minutes as needed for chest pain. 5 tablet 1  . pantoprazole (PROTONIX) 40 MG tablet Take 1 tablet (40 mg total) by mouth daily. 90 tablet 3   No current facility-administered medications on file prior to visit.   Review of Systems All otherwise neg per pt     Objective:   Physical Exam BP 110/60 (BP Location: Left Arm, Patient Position: Sitting, Cuff Size: Large)   Pulse 63   Temp 97.8 F (36.6 C) (Oral)   Ht '5\' 10"'  (1.778 m)   Wt 157 lb (71.2 kg)   SpO2 97%   BMI 22.53 kg/m  VS noted,  Constitutional: Pt appears in NAD HENT: Head: NCAT.  Right Ear:  External ear normal.  Left Ear: External ear normal.  Eyes: . Pupils are equal, round, and reactive to light. Conjunctivae and EOM are normal Nose: without d/c or deformity Neck: Neck supple. Gross normal ROM, has tender right plantar heel without overlying skin change Cardiovascular: Normal rate and regular rhythm.   Pulmonary/Chest: Effort normal and breath sounds without rales or wheezing.  Abd:  Soft, NT, ND, + BS, no organomegaly Neurological: Pt is alert. At baseline orientation, motor grossly intact Skin: Skin is warm. No rashes, other new lesions,  no LE edema Psychiatric: Pt behavior is normal without agitation  Lab Results  Component Value Date   WBC 8.4 11/30/2019   HGB 14.6 11/30/2019   HCT 44.2 11/30/2019   PLT 184.0 11/30/2019   GLUCOSE 191 (H) 11/30/2019   CHOL 117 11/30/2019   TRIG 116.0 11/30/2019   HDL 34.40 (L) 11/30/2019   LDLDIRECT 82.0 05/19/2018   LDLCALC 59 11/30/2019   ALT 21 11/30/2019   AST 16 11/30/2019   NA 140 11/30/2019   K 4.3 11/30/2019   CL 106 11/30/2019   CREATININE 1.03 11/30/2019   BUN 24 (H) 11/30/2019   CO2 27 11/30/2019   TSH 2.15 11/30/2019   PSA 2.49 12/21/2012   INR 1.15 12/10/2016   HGBA1C 7.5 (H) 11/30/2019   MICROALBUR 3.5 (H) 11/30/2019      Lab Results  Component Value Date   WBC 8.4 11/30/2019   HGB 14.6 11/30/2019   HCT 44.2 11/30/2019   PLT 184.0 11/30/2019   GLUCOSE 191 (H) 11/30/2019   CHOL 117 11/30/2019   TRIG 116.0 11/30/2019   HDL 34.40 (L) 11/30/2019   LDLDIRECT 82.0 05/19/2018   LDLCALC 59 11/30/2019   ALT 21 11/30/2019   AST 16 11/30/2019   NA 140 11/30/2019   K 4.3 11/30/2019   CL 106 11/30/2019   CREATININE 1.03 11/30/2019   BUN 24 (H) 11/30/2019   CO2 27 11/30/2019   TSH 2.15 11/30/2019   PSA 2.49 12/21/2012   INR 1.15 12/10/2016   HGBA1C 7.5 (H) 11/30/2019   MICROALBUR 3.5 (H) 11/30/2019       Assessment & Plan:

## 2020-05-29 NOTE — Assessment & Plan Note (Signed)
stable overall by history and exam, recent data reviewed with pt, and pt to continue medical treatment as before,  to f/u any worsening symptoms or concerns  

## 2020-05-29 NOTE — Assessment & Plan Note (Signed)
.  stble

## 2020-05-29 NOTE — Patient Instructions (Addendum)
Please take all new medication as prescribed - the jardiance 10 mg per day instead of the tradjenta  Please have the pharmacy call with a preferred medication instead of the jardiance if this is not covered  Ok to take th Voltaren gel topical for the heel pain  Please consider seeing Sports Medicine on the first floor for the back of the upper left leg pain, but I think this is muscular, and also see them of the right heel pain gets worse for a cortisone shot  Please continue all other medications as before, and refills have been done if requested.  Please have the pharmacy call with any other refills you may need.  Please continue your efforts at being more active, low cholesterol diet, and weight control.  You are otherwise up to date with prevention measures today.  Please keep your appointments with your specialists as you may have planned  Please go to the LAB at the blood drawing area for the tests to be done  You will be contacted by phone if any changes need to be made immediately.  Otherwise, you will receive a letter about your results with an explanation, but please check with MyChart first.  Please remember to sign up for MyChart if you have not done so, as this will be important to you in the future with finding out test results, communicating by private email, and scheduling acute appointments online when needed.  Please make an Appointment to return in 6 months, or sooner if needed

## 2020-05-29 NOTE — Assessment & Plan Note (Signed)
Ok for xyzal and nasaocrt asd,  to f/u any worsening symptoms or concerns

## 2020-05-29 NOTE — Assessment & Plan Note (Addendum)
.  stable overall by history and exam, recent data reviewed with pt, and pt to continue medical treatment as before,  to f/u any worsening symptoms or concerns, ok to change the tradjenta to jardiance or similar as covered by insurance  I spent 31 minutes in preparing to see the patient by review of recent labs, imaging and procedures, obtaining and reviewing separately obtained history, communicating with the patient and family or caregiver, ordering medications, tests or procedures, and documenting clinical information in the EHR including the differential Dx, treatment, and any further evaluation and other management of dm, htn hld, plantar fasciits

## 2020-05-30 LAB — HEMOGLOBIN A1C
Hgb A1c MFr Bld: 7.2 % of total Hgb — ABNORMAL HIGH (ref <5.7)
Mean Plasma Glucose: 160 (calc)
eAG (mmol/L): 8.9 (calc)

## 2020-05-30 LAB — HEPATIC FUNCTION PANEL
AG Ratio: 1.8 (calc) (ref 1.0–2.5)
ALT: 22 U/L (ref 9–46)
AST: 16 U/L (ref 10–35)
Albumin: 4.2 g/dL (ref 3.6–5.1)
Alkaline phosphatase (APISO): 55 U/L (ref 35–144)
Bilirubin, Direct: 0.1 mg/dL (ref 0.0–0.2)
Globulin: 2.4 g/dL (calc) (ref 1.9–3.7)
Indirect Bilirubin: 0.7 mg/dL (calc) (ref 0.2–1.2)
Total Bilirubin: 0.8 mg/dL (ref 0.2–1.2)
Total Protein: 6.6 g/dL (ref 6.1–8.1)

## 2020-05-30 LAB — BASIC METABOLIC PANEL
BUN/Creatinine Ratio: 18 (calc) (ref 6–22)
BUN: 21 mg/dL (ref 7–25)
CO2: 28 mmol/L (ref 20–32)
Calcium: 9.7 mg/dL (ref 8.6–10.3)
Chloride: 104 mmol/L (ref 98–110)
Creat: 1.14 mg/dL — ABNORMAL HIGH (ref 0.70–1.11)
Glucose, Bld: 121 mg/dL — ABNORMAL HIGH (ref 65–99)
Potassium: 4.4 mmol/L (ref 3.5–5.3)
Sodium: 140 mmol/L (ref 135–146)

## 2020-05-30 LAB — LIPID PANEL
Cholesterol: 140 mg/dL (ref <200)
HDL: 35 mg/dL — ABNORMAL LOW (ref >=40)
LDL Cholesterol (Calc): 79 mg/dL (calc)
Non-HDL Cholesterol (Calc): 105 mg/dL (calc) (ref <130)
Total CHOL/HDL Ratio: 4 (calc) (ref <5.0)
Triglycerides: 166 mg/dL — ABNORMAL HIGH (ref <150)

## 2020-06-01 ENCOUNTER — Ambulatory Visit: Payer: Medicare HMO | Admitting: Nurse Practitioner

## 2020-06-01 ENCOUNTER — Encounter: Payer: Self-pay | Admitting: Nurse Practitioner

## 2020-06-01 ENCOUNTER — Encounter: Payer: Self-pay | Admitting: Internal Medicine

## 2020-06-01 VITALS — BP 110/71 | HR 75 | Ht 70.0 in | Wt 155.4 lb

## 2020-06-01 DIAGNOSIS — G8929 Other chronic pain: Secondary | ICD-10-CM | POA: Diagnosis not present

## 2020-06-01 DIAGNOSIS — R1013 Epigastric pain: Secondary | ICD-10-CM

## 2020-06-01 NOTE — Patient Instructions (Signed)
If you are age 84 or older, your body mass index should be between 23-30. Your Body mass index is 22.29 kg/m. If this is out of the aforementioned range listed, please consider follow up with your Primary Care Provider.  If you are age 67 or younger, your body mass index should be between 19-25. Your Body mass index is 22.29 kg/m. If this is out of the aformentioned range listed, please consider follow up with your Primary Care Provider.   Follow up with the office as needed.

## 2020-06-01 NOTE — Progress Notes (Signed)
IMPRESSION and PLAN:     WILKIE ZENON is a 86 y.o. male with a PMH signficant for, but not necessarily limited to,  DM2, hypertension, hyperlipidemia ,  CAD/PCI, adenomatous colon polyps, GERD    # Bowel changes --He is not having diarrhea.  He has 1 semisolid movement every morning. Perhaps Metformin contributes to bowel consistency.  At any rate I do not think his current bowel habits are concerning and he doesn't find them intolerable.  --Follow up prn  # Epigastric discomfort / "indigestion"  --This has been much better since resuming PPI even prior to our last visit in June.  --Continue daily PPI and avoidance of trigger foods. --A 7 pound weight loss was noted at our last visit in June but he has managed to gain 3 pounds in the interim.  --Follow-up as needed  HPI:    Primary GI:  Lucio Edward, MD   Chief complaint : 3 to 4-week follow-up  This patient is an 86 year old male who I have seen numerous times through the years for GERD, bloating, gas, loose stool, upper abdominal discomfort.  I last saw him last 04/27/2020 for complaints of recurrent loose stool.  VSL probiotics had worked well for him in the past so we resumed them. Prior to the visit he had been having epigastric discomfort described as indigestion .  He had describes similar symptoms the previous year and Cardiology did not feel symptoms were cardiac related. We recommended an EGD at that time but it was never done.  When I saw patient in clinic in June his epigastric discomfort had already resolved after resuming pantoprazole and eliminating fried foods. A  7 pound weight loss since January was noted.  I asked him to come back for follow-up in a few weeks  Patient having problems with cost of Trudjenta. His co-pay at [facility] is 150.00 and he asks for my advice. I have him information on [facility] to shop prices.     INTERVAL HISTORY:   Mr Cargile is back, he only took 5 days of the VSL # 3.  He didn't  realize the VSL # 3 was for his complaints of loose stool. He thought they were for indigestion and without symptoms he saw no need to continue them . Furthermore he was experiencing dizziness which stopped with discontinuation of the probiotic . He only has one BM a day and is soft, partially unformed but not loose.  He has gained 3 pounds since her last visit   Review of systems:     No chest pain, no SOB, no fevers, no urinary sx   Past Medical History:  Diagnosis Date  . Adenomatous polyp of colon 05/2004  . Allergic rhinitis   . Anxiety   . BPH (benign prostatic hyperplasia)   . CAD (coronary artery disease)    s/p Cypher DES to LAD and pD1 2010;  LHC was done 6/12: EF 65%, circumflex patent, mid RCA 80-90% (small and nondominant), LAD and diagonal stents patent, LAD at the origin of the first diagonal 30%, ostial D2 90%, mid 80-90% (small vessel).  Continued medical therapy was recommended.   . Carotid stenosis    dopplers 02/2012: 0-16% RICA; 01-09% LICA  . Diabetes type 2, controlled ([place])   . Diverticulosis    colon  . ED (erectile dysfunction)   . Esophageal stricture   . GERD (gastroesophageal reflux disease)   . Hemorrhoids   . Hiatal hernia   .  Hyperlipidemia   . Hypertension   . IBS (irritable bowel syndrome)   . PVC's (premature ventricular contractions) 01/21/2020   Monitor 07/2019: PVC burden 21.6% // Echocardiogram 01/2020: EF 65-70, no RWMA, Gr 2 DD, normal RVSF, RVSP 43.3 mmHg (mod pul HTN), mild LAE, mild MR, trivial AI  . Right inguinal hernia    s/p repair in 8/12  . SVT (supraventricular tachycardia) (HCC)     Patient's surgical history, family medical history, social history, medications and allergies were all reviewed in Epic   Serum creatinine: 1.14 mg/dL (H) 05/29/20 1443 Estimated creatinine clearance: 46.4 mL/min (A)  Current Outpatient Medications  Medication Sig Dispense Refill  . ACCU-CHEK AVIVA PLUS test strip USE FOR TESTING UP TO 4 TIMES DAILY AS  DIRECTED 100 strip 2  . amLODipine (NORVASC) 5 MG tablet Take 1 tablet (5 mg total) by mouth daily. 90 tablet 3  . aspirin EC 81 MG tablet Take 81 mg by mouth daily after supper.    Marland Kitchen atorvastatin (LIPITOR) 10 MG tablet Take 1 tablet (10 mg total) by mouth daily at 6 PM. 90 tablet 1  . blood glucose meter kit and supplies Dispense based on patient and insurance preference. Use up to four times daily as directed. (FOR ICD-10 E10.9, E11.9). 1 each 0  . Blood Glucose Monitoring Suppl (ONE TOUCH ULTRA 2) w/Device KIT Use as directed E 11.9 1 each 0  . brimonidine (ALPHAGAN) 0.2 % ophthalmic solution ADMINISTER 1 DROP TO BOTH EYES THREE (3) TIMES A DAY.  3  . carvedilol (COREG) 3.125 MG tablet Take 1 tablet (3.125 mg total) by mouth 2 (two) times daily with a meal. 180 tablet 3  . diclofenac Sodium (VOLTAREN) 1 % GEL Apply 2 g topically 4 (four) times daily. 100 g 5  . empagliflozin (JARDIANCE) 10 MG TABS tablet Take 1 tablet (10 mg total) by mouth daily before breakfast. 90 tablet 3  . finasteride (PROSCAR) 5 MG tablet     . gabapentin (NEURONTIN) 300 MG capsule 1 - 2 tab by mouth at bedtime as needed 180 capsule 1  . GEMTESA 75 MG TABS     . glipiZIDE (GLUCOTROL XL) 5 MG 24 hr tablet TAKE 1 TABLET EVERY DAY WITH BREAKFAST ANNUAL APPT DUE IN JULY MUST SEE PROVIDER FOR FUTURE REFILLS 30 tablet 0  . Lancets MISC Use as directed twice daily E11.9 200 each 3  . latanoprost (XALATAN) 0.005 % ophthalmic solution Place 1 drop into both eyes at bedtime.  12  . levocetirizine (XYZAL) 5 MG tablet Take 1 tablet (5 mg total) by mouth at bedtime as needed. 90 tablet 3  . losartan (COZAAR) 100 MG tablet TAKE 1 TABLET EVERY DAY 90 tablet 1  . metFORMIN (GLUCOPHAGE) 500 MG tablet 2 tab by mouth in the AM, and 1 in the PM 270 tablet 3  . nitroGLYCERIN (NITROSTAT) 0.4 MG SL tablet Place 1 tablet (0.4 mg total) under the tongue every 5 (five) minutes as needed for chest pain. 5 tablet 1  . pantoprazole (PROTONIX) 40 MG  tablet Take 1 tablet (40 mg total) by mouth daily. 90 tablet 3  . triamcinolone (NASACORT) 55 MCG/ACT AERO nasal inhaler Place 2 sprays into the nose daily. 1 Inhaler 12   No current facility-administered medications for this visit.    Filed Weights   06/01/20 1031  Weight: 155 lb 6 oz (70.5 kg)    Physical Exam:     Ht _0  (1.778 m)   Wt  155 lb 6 oz (70.5 kg)   BMI 22.29 kg/m   GENERAL:  Pleasant male in NAD PSYCH: : Cooperative, normal affect CARDIAC:  RRR PULM: Normal respiratory effort, lungs CTA bilaterally, no wheezing ABDOMEN:  Nondistended, soft, nontender. No obvious masses, no hepatomegaly,  normal bowel sounds SKIN:  turgor, no lesions seen Musculoskeletal:  Normal muscle tone, normal strength NEURO: Alert and oriented x 3, no focal neurologic deficits   [name] , NP 06/01/2020, 10:37 AM

## 2020-06-02 DIAGNOSIS — H5319 Other subjective visual disturbances: Secondary | ICD-10-CM | POA: Diagnosis not present

## 2020-06-02 DIAGNOSIS — Z961 Presence of intraocular lens: Secondary | ICD-10-CM | POA: Diagnosis not present

## 2020-06-02 DIAGNOSIS — H21233 Degeneration of iris (pigmentary), bilateral: Secondary | ICD-10-CM | POA: Diagnosis not present

## 2020-06-07 ENCOUNTER — Other Ambulatory Visit: Payer: Self-pay | Admitting: Internal Medicine

## 2020-06-07 ENCOUNTER — Ambulatory Visit: Payer: Medicare HMO

## 2020-06-17 ENCOUNTER — Other Ambulatory Visit: Payer: Self-pay | Admitting: Internal Medicine

## 2020-06-17 NOTE — Telephone Encounter (Signed)
Please refill as per office routine med refill policy (all routine meds refilled for 3 mo or monthly per pt preference up to one year from last visit, then month to month grace period for 3 mo, then further med refills will have to be denied)  

## 2020-07-06 ENCOUNTER — Other Ambulatory Visit: Payer: Self-pay | Admitting: Internal Medicine

## 2020-07-06 NOTE — Telephone Encounter (Signed)
Please refill as per office routine med refill policy (all routine meds refilled for 3 mo or monthly per pt preference up to one year from last visit, then month to month grace period for 3 mo, then further med refills will have to be denied)  

## 2020-07-07 ENCOUNTER — Other Ambulatory Visit: Payer: Self-pay | Admitting: Internal Medicine

## 2020-07-07 NOTE — Telephone Encounter (Signed)
Please refill as per office routine med refill policy (all routine meds refilled for 3 mo or monthly per pt preference up to one year from last visit, then month to month grace period for 3 mo, then further med refills will have to be denied)  

## 2020-07-12 ENCOUNTER — Telehealth: Payer: Self-pay

## 2020-07-12 NOTE — Telephone Encounter (Signed)
New message    Test results to be mailed to his home address.

## 2020-07-14 NOTE — Telephone Encounter (Signed)
Sent to Dr. John. 

## 2020-07-14 NOTE — Telephone Encounter (Signed)
Ok but I have on way to track this  The only suggestion is for pt to turn off his mychart so I would naturally mail his results, but I am not sure how to do this

## 2020-07-25 NOTE — Progress Notes (Signed)
Cardiology Office Note:    Date:  07/26/2020   ID:  Keith Herrera, DOB Aug 31, 1932, MRN [id number]  PCP:  Keith Borg, MD  [facility] Cardiologist:  Keith Mocha, MD   Keith Herrera:  Keith Peru, MD   Referring MD: Keith Borg, MD   Chief Complaint:  Follow-up (CAD, PVCs)    Patient Profile:    Keith Herrera is a 86 y.o. male with:   Coronary artery disease  ? S/p DES to LAD and D1 in 2010 ? S/p POBA to D1 in 11/2016 (no stent due to prior PCI)  Carotid artery disease  PSVT ? EPS in 2016 >> no inducible SVT  PVCs ? Symptomatic; eval by Dr. Lovena Le in past (tx options limited due to CAD) ? Monitor 07/2019: PVC burden 21.6%  Hypertension   Hyperlipidemia   Diabetes mellitus 2   GERD   Esophageal stricture ? Prior hx of chest pain of GI origin  Prior CV studies: Echocardiogram 01/20/20 EF 65-70, no RWMA, Gr 2 DD, normal RVSF, RVSP 43.3 (mod elevated), mild LAE, mild MR, trivial AI  LT 3-14 day monitor 07/2019 1) the basic rhythm is normal sinus with an average HR of 60 bpm 2) there are occasional PAC's with short supraventricular runs, longest 12 beats 3) frequent PVC's with PVC burden of 21.6%, but no ventricular runs 4) no pathologic pauses 5) no atrial fibrillation or flutter  48 Hr Holter 12/2016 1. NSR and sinus bradycardia 2. Frequent PAC's and rare PVC's 3. Daytime sinus bradycardia is present with HR's down to 40/min 4. Very brief NS atrial and NS ventricular tachycardia 5. No long pauses observed.  Cardiac catheterization 12/10/16 LAD prox stent patent; D1 ost stent patent then 80; D1 ost 75 LCx irregs RCA mid 100 (L-R collats) PCI: POBA to D1  Echocardiogram 12/09/2016 EF 65-70, no RWMA, Gr 1 DD, mild TR  Carotid US 04/2016 Bilat 1-39; normal subclavians  Myoview 06/10/14 Low risk stress nuclear study Normal myocardial perfusion with no evidence of ischemia. EKG showed baseline ST abnormality with 30m of  horizontal to upsloping ST segment depression during exercise. Fair exercise tolerance.   LV Ejection Fraction: 71%. LV Wall Motion: NL LV Function; NL Wall Motion   History of Present Illness:    Keith Herrera last seen by Dr. CBurt Knackin 3/21.  He returns for follow up.  He is here alone.  Since last seen, he has been doing well.  He has not had chest discomfort or significant shortness of breath.  He has not had orthopnea, syncope or leg swelling.  He does get lightheaded if he stands up quickly.      Past Medical History:  Diagnosis Date  . Adenomatous polyp of colon 05/2004  . Allergic rhinitis   . Anxiety   . BPH (benign prostatic hyperplasia)   . CAD (coronary artery disease)    s/p Cypher DES to LAD and pD1 2010;  LHC was done 6/12: EF 65%, circumflex patent, mid RCA 80-90% (small and nondominant), LAD and diagonal stents patent, LAD at the origin of the first diagonal 30%, ostial D2 90%, mid 80-90% (small vessel).  Continued medical therapy was recommended.   . Carotid stenosis    dopplers 02/2012: 09-79%RICA; 489-21%LICA  . Diabetes type 2, controlled ([place]   . Diverticulosis    colon  . ED (erectile dysfunction)   . Esophageal stricture   . GERD (gastroesophageal reflux disease)   . Hemorrhoids   .  Hiatal hernia   . Hyperlipidemia   . Hypertension   . IBS (irritable bowel syndrome)   . PVC's (premature ventricular contractions) 01/21/2020   Monitor 07/2019: PVC burden 21.6% // Echocardiogram 01/2020: EF 65-70, no RWMA, Gr 2 DD, normal RVSF, RVSP 43.3 mmHg (mod pul HTN), mild LAE, mild MR, trivial AI  . Right inguinal hernia    s/p repair in 8/12  . SVT (supraventricular tachycardia) (HCC)     Current Medications: Current Meds  Medication Sig  . ACCU-CHEK AVIVA PLUS test strip USE FOR TESTING UP TO 4 TIMES DAILY AS DIRECTED  . amLODipine (NORVASC) 5 MG tablet Take 1 tablet (5 mg total) by mouth daily.  Marland Kitchen aspirin EC 81 MG tablet Take 81 mg by mouth daily after supper.    Marland Kitchen atorvastatin (LIPITOR) 10 MG tablet TAKE 1 TABLET (10 MG TOTAL) BY MOUTH DAILY AT 6 PM.  . blood glucose meter kit and supplies Dispense based on patient and insurance preference. Use up to four times daily as directed. (FOR ICD-10 E10.9, E11.9).  Marland Kitchen Blood Glucose Monitoring Suppl (ONE TOUCH ULTRA 2) w/Device KIT Use as directed E 11.9  . brimonidine (ALPHAGAN) 0.2 % ophthalmic solution ADMINISTER 1 DROP TO BOTH EYES THREE (3) TIMES A DAY.  . carvedilol (COREG) 3.125 MG tablet Take 1 tablet (3.125 mg total) by mouth 2 (two) times daily with a meal.  . empagliflozin (JARDIANCE) 10 MG TABS tablet Take 1 tablet (10 mg total) by mouth daily before breakfast.  . finasteride (PROSCAR) 5 MG tablet Take 5 mg by mouth daily.   Marland Kitchen glipiZIDE (GLUCOTROL XL) 5 MG 24 hr tablet TAKE 1 TABLET EVERY DAY WITH BREAKFAST ANNUAL APPT DUE IN JULY MUST SEE PROVIDER FOR FUTURE REFILLS  . Lancets MISC Use as directed twice daily E11.9  . latanoprost (XALATAN) 0.005 % ophthalmic solution Place 1 drop into both eyes at bedtime.  Marland Kitchen losartan (COZAAR) 100 MG tablet TAKE 1 TABLET EVERY DAY  . metFORMIN (GLUCOPHAGE) 500 MG tablet Take 500 mg by mouth 2 (two) times daily with a meal.  . nitroGLYCERIN (NITROSTAT) 0.4 MG SL tablet Place 1 tablet (0.4 mg total) under the tongue every 5 (five) minutes as needed for chest pain.  . pantoprazole (PROTONIX) 40 MG tablet Take 1 tablet (40 mg total) by mouth daily.  Marland Kitchen triamcinolone (NASACORT) 55 MCG/ACT AERO nasal inhaler Place 2 sprays into the nose daily.  . [DISCONTINUED] nitroGLYCERIN (NITROSTAT) 0.4 MG SL tablet Place 1 tablet (0.4 mg total) under the tongue every 5 (five) minutes as needed for chest pain.     Allergies:   Crestor [rosuvastatin calcium] and Lovastatin   Social History   Tobacco Use  . Smoking status: Former Smoker    Types: Cigarettes    Quit date: 03/05/1956    Years since quitting: 64.4  . Smokeless tobacco: Never Used  . Tobacco comment: smoked in teens   Vaping Use  . Vaping Use: Never used  Substance Use Topics  . Alcohol use: No    Alcohol/week: 0.0 standard drinks  . Drug use: No     Family Hx: The patient's family history includes Bladder Cancer (age of onset: 34) in his mother; Healthy in his daughter and son; Heart attack (age of onset: 35) in his father; Pancreatic cancer (age of onset: 29) in his sister. There is no history of Colon cancer.  ROS   EKGs/Labs/Other Test Reviewed:    EKG:  EKG is   ordered today.  The ekg ordered today demonstrates sinus bradycardia, HR 58, normal first, right bundle branch block, PVCs  Recent Labs: 11/30/2019: Hemoglobin 14.6; Platelets 184.0; TSH 2.15 05/29/2020: ALT 22; BUN 21; Creat 1.14; Potassium 4.4; Sodium 140   Recent Lipid Panel Lab Results  Component Value Date/Time   CHOL 140 05/29/2020 02:43 PM   TRIG 166 (H) 05/29/2020 02:43 PM   TRIG 128 11/19/2006 10:32 AM   HDL 35 (L) 05/29/2020 02:43 PM   CHOLHDL 4.0 05/29/2020 02:43 PM   LDLCALC 79 05/29/2020 02:43 PM   LDLDIRECT 82.0 05/19/2018 10:58 AM    Physical Exam:    VS:  BP 124/60   Pulse (!) 58   Ht _0  (1.778 m)   Wt 156 lb (70.8 kg)   SpO2 98%   BMI 22.38 kg/m     Wt Readings from Last 3 Encounters:  07/26/20 156 lb (70.8 kg)  06/01/20 155 lb 6 oz (70.5 kg)  05/29/20 157 lb (71.2 kg)     Constitutional:      Appearance: Healthy appearance. Not in distress.  Neck:     Vascular: JVD normal.  Pulmonary:     Effort: Pulmonary effort is normal.     Breath sounds: No wheezing. No rales.  Cardiovascular:     Normal rate. Irregular rhythm. Normal S1. Normal S2.     Murmurs: There is no murmur.  Edema:    Peripheral edema absent.  Abdominal:     Palpations: Abdomen is soft.  Skin:    General: Skin is warm and dry.  Neurological:     General: No focal deficit present.     Mental Status: Alert and oriented to person, place and time.     Cranial Nerves: Cranial nerves are intact.       ASSESSMENT &  PLAN:    1. Coronary artery disease involving native coronary artery of native heart without angina pectoris History of drug-eluting stent to the LAD and drug-eluting stent to the diagonal in 2010 and subsequent balloon angioplasty to the diagonal in 2018.    He is doing well without angina.  Aspirin, atorvastatin, carvedilol.  Follow-up in 6 months.  2. PVC's (premature ventricular contractions) Echocardiogram in March 2021 with normal EF.  The most part, he is asymptomatic.  Continue current dose of carvedilol.  3. Essential (primary) hypertension The patient's blood pressure is controlled on his current regimen.  Continue current therapy.   4. Hyperlipidemia LDL goal <70 Fair control.  Continue current dose of atorvastatin.   Dispo:  Return in about 6 months (around 01/23/2021) for Routine Follow Up, w/ Dr. Burt Knack, or Richardson Dopp, PA-C, in person.   Medication Adjustments/Labs and Tests Ordered: Current medicines are reviewed at length with the patient today.  Concerns regarding medicines are outlined above.  Tests Ordered: Orders Placed This Encounter  Procedures  . EKG 12-Lead   Medication Changes: Meds ordered this encounter  Medications  . nitroGLYCERIN (NITROSTAT) 0.4 MG SL tablet    Sig: Place 1 tablet (0.4 mg total) under the tongue every 5 (five) minutes as needed for chest pain.    Dispense:  25 tablet    Refill:  3    Signed, Richardson Dopp, PA-C  07/26/2020 10:55 AM    [facility] [address], [place], [place]  [address] Phone: [phone]; Fax: [phone]

## 2020-07-26 ENCOUNTER — Ambulatory Visit: Payer: Medicare HMO | Admitting: Physician Assistant

## 2020-07-26 ENCOUNTER — Encounter: Payer: Self-pay | Admitting: Physician Assistant

## 2020-07-26 ENCOUNTER — Other Ambulatory Visit: Payer: Self-pay

## 2020-07-26 VITALS — BP 124/60 | HR 58 | Ht 70.0 in | Wt 156.0 lb

## 2020-07-26 DIAGNOSIS — I2510 Atherosclerotic heart disease of native coronary artery without angina pectoris: Secondary | ICD-10-CM

## 2020-07-26 DIAGNOSIS — I493 Ventricular premature depolarization: Secondary | ICD-10-CM

## 2020-07-26 DIAGNOSIS — I10 Essential (primary) hypertension: Secondary | ICD-10-CM

## 2020-07-26 DIAGNOSIS — E785 Hyperlipidemia, unspecified: Secondary | ICD-10-CM | POA: Diagnosis not present

## 2020-07-26 MED ORDER — NITROGLYCERIN 0.4 MG SL SUBL: 0.4000 mg | SUBLINGUAL_TABLET | SUBLINGUAL | 3 refills | Status: DC | PRN

## 2020-07-26 NOTE — Patient Instructions (Signed)
Medication Instructions:  Your physician recommends that you continue on your current medications as directed. Please refer to the Current Medication list given to you today.  *If you need a refill on your cardiac medications before your next appointment, please call your pharmacy*  Lab Work: None ordered today  If you have labs (blood work) drawn today and your tests are completely normal, you will receive your results only by: Marland Kitchen MyChart Message (if you have MyChart) OR . A paper copy in the mail If you have any lab test that is abnormal or we need to change your treatment, we will call you to review the results.  Testing/Procedures: None ordered today  Follow-Up: On 02/02/2021 at 2:20PM with Sherren Mocha, MD

## 2020-08-05 ENCOUNTER — Other Ambulatory Visit: Payer: Self-pay | Admitting: Internal Medicine

## 2020-08-05 NOTE — Telephone Encounter (Signed)
Please refill as per office routine med refill policy (all routine meds refilled for 3 mo or monthly per pt preference up to one year from last visit, then month to month grace period for 3 mo, then further med refills will have to be denied)  

## 2020-08-20 ENCOUNTER — Other Ambulatory Visit: Payer: Self-pay | Admitting: Internal Medicine

## 2020-08-20 NOTE — Telephone Encounter (Signed)
Please refill as per office routine med refill policy (all routine meds refilled for 3 mo or monthly per pt preference up to one year from last visit, then month to month grace period for 3 mo, then further med refills will have to be denied)  

## 2020-09-21 ENCOUNTER — Other Ambulatory Visit: Payer: Self-pay

## 2020-09-21 ENCOUNTER — Ambulatory Visit (INDEPENDENT_AMBULATORY_CARE_PROVIDER_SITE_OTHER): Payer: Medicare HMO

## 2020-09-21 ENCOUNTER — Other Ambulatory Visit: Payer: Self-pay | Admitting: Cardiovascular Disease

## 2020-09-21 DIAGNOSIS — Z23 Encounter for immunization: Secondary | ICD-10-CM | POA: Diagnosis not present

## 2020-10-22 ENCOUNTER — Other Ambulatory Visit: Payer: Self-pay | Admitting: Internal Medicine

## 2020-10-22 NOTE — Telephone Encounter (Signed)
Please refill as per office routine med refill policy (all routine meds refilled for 3 mo or monthly per pt preference up to one year from last visit, then month to month grace period for 3 mo, then further med refills will have to be denied)  

## 2020-10-24 ENCOUNTER — Other Ambulatory Visit: Payer: Self-pay

## 2020-10-24 ENCOUNTER — Ambulatory Visit (INDEPENDENT_AMBULATORY_CARE_PROVIDER_SITE_OTHER): Payer: Medicare HMO | Admitting: Internal Medicine

## 2020-10-24 ENCOUNTER — Ambulatory Visit (INDEPENDENT_AMBULATORY_CARE_PROVIDER_SITE_OTHER)
Admission: RE | Admit: 2020-10-24 | Discharge: 2020-10-24 | Disposition: A | Discharge status: Home / Self Care | Payer: Medicare HMO | Source: Ambulatory Visit | Attending: Internal Medicine | Admitting: Internal Medicine

## 2020-10-24 ENCOUNTER — Encounter: Payer: Self-pay | Admitting: Internal Medicine

## 2020-10-24 VITALS — BP 150/62 | HR 61 | Temp 97.8°F | Ht 70.0 in | Wt 154.0 lb

## 2020-10-24 DIAGNOSIS — M25511 Pain in right shoulder: Secondary | ICD-10-CM | POA: Insufficient documentation

## 2020-10-24 DIAGNOSIS — R202 Paresthesia of skin: Secondary | ICD-10-CM

## 2020-10-24 DIAGNOSIS — R200 Anesthesia of skin: Secondary | ICD-10-CM

## 2020-10-24 DIAGNOSIS — H21233 Degeneration of iris (pigmentary), bilateral: Secondary | ICD-10-CM | POA: Diagnosis not present

## 2020-10-24 NOTE — Progress Notes (Signed)
Subjective:    Patient ID: Keith Herrera, male    DOB: 09/13/1932, 86 y.o.   MRN: [id number]  HPI  Here with co new onset 4 days LUE and perioral tingling and numbness, moderate intermittent,, nothing seems to make better or worse.  Also had pain to the left elbow at the onset of the LUE tingling but now other LUE pain, weakness, or neck pain.  Pt denies chest pain, increased sob or doe, wheezing, orthopnea, PND, increased LE swelling, palpitations, dizziness or syncope.  Pt denies other new neurological symptoms such as new headache, or facial or extremity weakness.   Pt denies polydipsia, polyuria, Also had a fall to the right upper back x 3 wks and still has significant pain and mild reduced ROM shoulder joint.  Did have eye exam this am incidentally doing well.     Pt denies fever, wt loss, night sweats, loss of appetite, or other constitutional symptoms   Past Medical History:  Diagnosis Date  . Adenomatous polyp of colon 05/2004  . Allergic rhinitis   . Anxiety   . BPH (benign prostatic hyperplasia)   . CAD (coronary artery disease)    s/p Cypher DES to LAD and pD1 2010;  LHC was done 6/12: EF 65%, circumflex patent, mid RCA 80-90% (small and nondominant), LAD and diagonal stents patent, LAD at the origin of the first diagonal 30%, ostial D2 90%, mid 80-90% (small vessel).  Continued medical therapy was recommended.   . Carotid stenosis    dopplers 02/2012: 1-76% RICA; 16-07% LICA  . Diabetes type 2, controlled ([place])   . Diverticulosis    colon  . ED (erectile dysfunction)   . Esophageal stricture   . GERD (gastroesophageal reflux disease)   . Hemorrhoids   . Hiatal hernia   . Hyperlipidemia   . Hypertension   . IBS (irritable bowel syndrome)   . PVC's (premature ventricular contractions) 01/21/2020   Monitor 07/2019: PVC burden 21.6% // Echocardiogram 01/2020: EF 65-70, no RWMA, Gr 2 DD, normal RVSF, RVSP 43.3 mmHg (mod pul HTN), mild LAE, mild MR, trivial AI  . Right inguinal  hernia    s/p repair in 8/12  . SVT (supraventricular tachycardia) (HCC)    Past Surgical History:  Procedure Laterality Date  . CARDIAC CATHETERIZATION N/A 12/10/2016   Procedure: Left Heart Cath and Coronary Angiography;  Surgeon: Jettie Booze, MD;  Location: [facility];  Service: Cardiovascular;  Laterality: N/A;  . CARDIAC CATHETERIZATION N/A 12/10/2016   Procedure: Coronary Balloon Angioplasty;  Surgeon: Jettie Booze, MD;  Location: [facility];  Service: Cardiovascular;  Laterality: N/A;  . CARDIAC CATHETERIZATION N/A 12/10/2016   Procedure: Intravascular Pressure Wire/FFR Study;  Surgeon: Jettie Booze, MD;  Location: [facility];  Service: Cardiovascular;  Laterality: N/A;  . CORONARY STENT PLACEMENT  10/2008   x 2  . ELECTROPHYSIOLOGIC STUDY N/A 03/20/2015   no inducible SVT - Dr Lovena Le  . INGUINAL HERNIA REPAIR Right    RIH with ultrapro patch    reports that he quit smoking about 64 years ago. His smoking use included cigarettes. He has never used smokeless tobacco. He reports that he does not drink alcohol and does not use drugs. family history includes Bladder Cancer (age of onset: 73) in his mother; Healthy in his daughter and son; Heart attack (age of onset: 70) in his father; Pancreatic cancer (age of onset: 52) in his sister. Allergies  Allergen Reactions  .  Crestor [Rosuvastatin Calcium] Rash    All over body  . Lovastatin Itching and Rash   Current Outpatient Medications on File Prior to Visit  Medication Sig Dispense Refill  . ACCU-CHEK AVIVA PLUS test strip USE FOR TESTING UP TO 4 TIMES DAILY AS DIRECTED 100 strip 2  . amLODipine (NORVASC) 5 MG tablet Take 1 tablet (5 mg total) by mouth daily. 90 tablet 3  . aspirin EC 81 MG tablet Take 81 mg by mouth daily after supper.    Marland Kitchen atorvastatin (LIPITOR) 10 MG tablet TAKE 1 TABLET (10 MG TOTAL) BY MOUTH DAILY AT 6 PM. 90 tablet 1  . blood glucose meter kit and supplies Dispense  based on patient and insurance preference. Use up to four times daily as directed. (FOR ICD-10 E10.9, E11.9). 1 each 0  . Blood Glucose Monitoring Suppl (ONE TOUCH ULTRA 2) w/Device KIT Use as directed E 11.9 1 each 0  . brimonidine (ALPHAGAN) 0.2 % ophthalmic solution ADMINISTER 1 DROP TO BOTH EYES THREE (3) TIMES A DAY.  3  . carvedilol (COREG) 3.125 MG tablet TAKE 1 TABLET (3.125 MG TOTAL) BY MOUTH 2 (TWO) TIMES DAILY WITH A MEAL. 180 tablet 3  . empagliflozin (JARDIANCE) 10 MG TABS tablet Take 1 tablet (10 mg total) by mouth daily before breakfast. 90 tablet 3  . finasteride (PROSCAR) 5 MG tablet Take 5 mg by mouth daily.     Marland Kitchen glipiZIDE (GLUCOTROL XL) 5 MG 24 hr tablet TAKE 1 TABLET EVERY DAY WITH BREAKFAST ANNUAL APPT DUE IN JULY MUST SEE PROVIDER FOR FUTURE REFILLS 90 tablet 1  . Lancets MISC Use as directed twice daily E11.9 200 each 3  . latanoprost (XALATAN) 0.005 % ophthalmic solution Place 1 drop into both eyes at bedtime.  12  . losartan (COZAAR) 100 MG tablet TAKE 1 TABLET EVERY DAY 90 tablet 1  . metFORMIN (GLUCOPHAGE) 500 MG tablet Take 500 mg by mouth 2 (two) times daily with a meal.    . nitroGLYCERIN (NITROSTAT) 0.4 MG SL tablet Place 1 tablet (0.4 mg total) under the tongue every 5 (five) minutes as needed for chest pain. 25 tablet 3  . pantoprazole (PROTONIX) 40 MG tablet Take 1 tablet (40 mg total) by mouth daily. 90 tablet 3  . triamcinolone (NASACORT) 55 MCG/ACT AERO nasal inhaler Place 2 sprays into the nose daily. 1 Inhaler 12  . triamcinolone ointment (KENALOG) 0.1 %      No current facility-administered medications on file prior to visit.   oReview of Systems All otherwise neg per pt    Objective:   Physical Exam BP (!) 150/62 (BP Location: Left Arm, Patient Position: Sitting, Cuff Size: Large)   Pulse 61   Temp 97.8 F (36.6 C) (Oral)   Ht _0  (1.778 m)   Wt 154 lb (69.9 kg)   SpO2 98%   BMI 22.10 kg/m  VS noted,  Constitutional: Pt appears in  NAD HENT: Head: NCAT.  Right Ear: External ear normal.  Left Ear: External ear normal.  Eyes: . Pupils are equal, round, and reactive to light. Conjunctivae and EOM are normal Nose: without d/c or deformity Neck: Neck supple. Gross normal ROM Cardiovascular: Normal rate and regular rhythm.   Pulmonary/Chest: Effort normal and breath sounds without rales or wheezing.  Abd:  Soft, NT, ND, + BS, no organomegaly Neurological: Pt is alert. At baseline orientation, motor grossly intact Skin: Skin is warm. No rashes, other new lesions, no LE edema Psychiatric: Pt  behavior is normal without agitation  All otherwise neg per pt Lab Results  Component Value Date   WBC 8.4 11/30/2019   HGB 14.6 11/30/2019   HCT 44.2 11/30/2019   PLT 184.0 11/30/2019   GLUCOSE 121 (H) 05/29/2020   CHOL 140 05/29/2020   TRIG 166 (H) 05/29/2020   HDL 35 (L) 05/29/2020   LDLDIRECT 82.0 05/19/2018   LDLCALC 79 05/29/2020   ALT 22 05/29/2020   AST 16 05/29/2020   NA 140 05/29/2020   K 4.4 05/29/2020   CL 104 05/29/2020   CREATININE 1.14 (H) 05/29/2020   BUN 21 05/29/2020   CO2 28 05/29/2020   TSH 2.15 11/30/2019   PSA 2.49 12/21/2012   INR 1.15 12/10/2016   HGBA1C 7.2 (H) 05/29/2020   MICROALBUR 3.5 (H) 11/30/2019       Assessment & Plan:

## 2020-10-24 NOTE — Patient Instructions (Addendum)
Please continue all other medications as before, and refills have been done if requested.  Please have the pharmacy call with any other refills you may need.  Please continue your efforts at being more active, low cholesterol diet, and weight control.  Please keep your appointments with your specialists as you may have planned  You will be contacted regarding the referral for: MRI for the brain to rule out stroke  You may need to see a Neurosurgury if the MRI is negative for possible left arm ulnar neuritis (or sports medicine on the first floor)  Please go to the XRAY Department in the first floor for the x-ray testing at the [facility] will be contacted by phone if any changes need to be made immediately.  Otherwise, you will receive a letter about your results with an explanation, but please check with MyChart first.  Please remember to sign up for MyChart if you have not done so, as this will be important to you in the future with finding out test results, communicating by private email, and scheduling acute appointments online when needed.

## 2020-10-25 ENCOUNTER — Other Ambulatory Visit: Payer: Self-pay | Admitting: Internal Medicine

## 2020-10-25 ENCOUNTER — Encounter: Payer: Self-pay | Admitting: Internal Medicine

## 2020-10-25 DIAGNOSIS — R200 Anesthesia of skin: Secondary | ICD-10-CM | POA: Insufficient documentation

## 2020-10-25 DIAGNOSIS — S42141A Displaced fracture of glenoid cavity of scapula, right shoulder, initial encounter for closed fracture: Secondary | ICD-10-CM

## 2020-10-25 DIAGNOSIS — R202 Paresthesia of skin: Secondary | ICD-10-CM | POA: Insufficient documentation

## 2020-10-25 NOTE — Assessment & Plan Note (Signed)
Exam c/w ? Left ulnar neuritis vs CVA - for stat MRI head, cont asa/lipitor, consider f/u sports medicine  I spent 31 minutes in preparing to see the patient by review of recent labs, imaging and procedures, obtaining and reviewing separately obtained history, communicating with the patient and family or caregiver, ordering medications, tests or procedures, and documenting clinical information in the EHR including the differential Dx, treatment, and any further evaluation and other management of left arm pain and numbness, perioral numbness, and right shoulder pain

## 2020-10-25 NOTE — Assessment & Plan Note (Signed)
Cant r/o underlying fx - for xray, pain control, consider f/u sports medicine

## 2020-10-25 NOTE — Assessment & Plan Note (Signed)
etiloogy unclaer - ? anxiety vs other - for MRI head, cont asa/statin,  to f/u any worsening symptoms or concerns

## 2020-10-26 ENCOUNTER — Telehealth: Payer: Self-pay | Admitting: Internal Medicine

## 2020-10-26 NOTE — Telephone Encounter (Signed)
Patient called and was wanting to know the results of his recent x-ray. He can be reached at [phone]

## 2020-10-26 NOTE — Telephone Encounter (Signed)
Spoke with pt and was able to inform him of Dr. Gwynn Burly note along with informing him of his referral to Sports Med.

## 2020-10-27 ENCOUNTER — Other Ambulatory Visit: Payer: Self-pay

## 2020-10-27 ENCOUNTER — Ambulatory Visit (INDEPENDENT_AMBULATORY_CARE_PROVIDER_SITE_OTHER): Payer: Medicare HMO

## 2020-10-27 ENCOUNTER — Ambulatory Visit: Payer: Self-pay

## 2020-10-27 ENCOUNTER — Ambulatory Visit: Payer: Medicare HMO | Admitting: Family Medicine

## 2020-10-27 ENCOUNTER — Encounter: Payer: Self-pay | Admitting: Family Medicine

## 2020-10-27 VITALS — BP 132/72 | HR 57 | Ht 70.0 in | Wt 154.0 lb

## 2020-10-27 DIAGNOSIS — M542 Cervicalgia: Secondary | ICD-10-CM

## 2020-10-27 DIAGNOSIS — M5010 Cervical disc disorder with radiculopathy, unspecified cervical region: Secondary | ICD-10-CM | POA: Diagnosis not present

## 2020-10-27 DIAGNOSIS — M25511 Pain in right shoulder: Secondary | ICD-10-CM | POA: Diagnosis not present

## 2020-10-27 MED ORDER — GABAPENTIN 100 MG PO CAPS: 100.0000 mg | ORAL_CAPSULE | Freq: Every day | ORAL | 0 refills | Status: DC

## 2020-10-27 NOTE — Progress Notes (Addendum)
[facility] [place] Phone: [phone] Subjective:   Fontaine No, am serving as a scribe for Dr. Hulan Saas. This visit occurred during the SARS-CoV-2 public health emergency.  Safety protocols were in place, including screening questions prior to the visit, additional usage of staff PPE, and extensive cleaning of exam room while observing appropriate contact time as indicated for disinfecting solutions.   I'm seeing this patient by the request  of:  [name], MD  CC: Right shoulder discomfort, left hand numbness  [id number]  Keith Herrera is a 86 y.o. male coming in with complaint of right shoulder pain. Patient states he fell 2 weeks ago.[place] off his lawnmower.  Pain with movement in back of shoulder. Denies any radiating symptoms. Does complain of tingling in left hand which is why he went in to see Dr. Jenny Reichmann on 10/24/2020 prior to suffering an acute fall . Denies any pain in cervical spine.   Patient states that the left hand numbness seems to be more irritated at night.  Patient states it does radiate up past his elbow sometimes.  States that it is fairly diffuse.  Not any specific fingers seems better during the day but can keep him up at night denies chest pain, SOB, dizziness. Did see PCP earlier and complained of some paraesthesia orally but does not seem to be related to the arm at all   Patient still able to do all ADL's with both problems, even wore a leaf blower on his back yesterday for hours without any worsening of pain. Has been active with a lot of outdoor activities.   Right shoulder Xray 10/24/2020 IMPRESSION: 1. Sclerotic line involving the superior glenoid may reflect subacute fracture deformity. 2. 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 3. Mild acromioclavicular osteoarthrosis.    Past Medical History:  Diagnosis Date  . Adenomatous polyp of colon 05/2004  . Allergic  rhinitis   . Anxiety   . BPH (benign prostatic hyperplasia)   . CAD (coronary artery disease)    s/p Cypher DES to LAD and pD1 2010;  LHC was done 6/12: EF 65%, circumflex patent, mid RCA 80-90% (small and nondominant), LAD and diagonal stents patent, LAD at the origin of the first diagonal 30%, ostial D2 90%, mid 80-90% (small vessel).  Continued medical therapy was recommended.   . Carotid stenosis    dopplers 02/2012: 5-39% RICA; 76-73% LICA  . Diabetes type 2, controlled ([place])   . Diverticulosis    colon  . ED (erectile dysfunction)   . Esophageal stricture   . GERD (gastroesophageal reflux disease)   . Hemorrhoids   . Hiatal hernia   . Hyperlipidemia   . Hypertension   . IBS (irritable bowel syndrome)   . PVC's (premature ventricular contractions) 01/21/2020   Monitor 07/2019: PVC burden 21.6% // Echocardiogram 01/2020: EF 65-70, no RWMA, Gr 2 DD, normal RVSF, RVSP 43.3 mmHg (mod pul HTN), mild LAE, mild MR, trivial AI  . Right inguinal hernia    s/p repair in 8/12  . SVT (supraventricular tachycardia) (HCC)    Past Surgical History:  Procedure Laterality Date  . CARDIAC CATHETERIZATION N/A 12/10/2016   Procedure: Left Heart Cath and Coronary Angiography;  Surgeon: Jettie Booze, MD;  Location: [facility];  Service: Cardiovascular;  Laterality: N/A;  . CARDIAC CATHETERIZATION N/A 12/10/2016   Procedure: Coronary Balloon Angioplasty;  Surgeon: Jettie Booze, MD;  Location: [facility];  Service: Cardiovascular;  Laterality: N/A;  . CARDIAC CATHETERIZATION N/A 12/10/2016   Procedure: Intravascular Pressure Wire/FFR Study;  Surgeon: Jettie Booze, MD;  Location: [facility];  Service: Cardiovascular;  Laterality: N/A;  . CORONARY STENT PLACEMENT  10/2008   x 2  . ELECTROPHYSIOLOGIC STUDY N/A 03/20/2015   no inducible SVT - Dr Lovena Le  . INGUINAL HERNIA REPAIR Right    RIH with ultrapro patch   Social History   Socioeconomic History  . Marital  status: Married    Spouse name: Not on file  . Number of children: 3  . Years of education: Not on file  . Highest education level: Not on file  Occupational History  . 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 Smoking status: Former Smoker    Types: Cigarettes    Quit date: 03/05/1956    Years since quitting: 64.6  . Smokeless tobacco: Never Used  . Tobacco comment: smoked in teens  Vaping Use  . Vaping Use: Never used  Substance and Sexual Activity  . Alcohol use: No    Alcohol/week: 0.0 standard drinks  . Drug use: No  . Sexual activity: Not on file  Other Topics Concern  . Not on file  Social History Narrative   Lives with wife in a 3 story home.  Has 2 living children.  1 passed away in a car accident.     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     Social Determinants of Health   Financial Resource Strain: Not on file  Food Insecurity: Not on file  Transportation Needs: Not on file  Physical Activity: Not on file  Stress: Not on file  Social Connections: Not on file   Allergies  Allergen Reactions  . Crestor [Rosuvastatin Calcium] Rash    All over body  . Lovastatin Itching and Rash   Family History  Problem Relation Age of Onset  . Bladder Cancer Mother 48  . Heart attack Father 1       Deceased  . Pancreatic cancer Sister 50  . Healthy Son   . Healthy Daughter   . Colon cancer Neg Hx     Current Outpatient Medications (Endocrine & Metabolic):  .  empagliflozin (JARDIANCE) 10 MG TABS tablet, Take 1 tablet (10 mg total) by mouth daily before breakfast. .  glipiZIDE (GLUCOTROL XL) 5 MG 24 hr tablet, TAKE 1 TABLET EVERY DAY WITH BREAKFAST ANNUAL APPT DUE IN JULY MUST SEE PROVIDER FOR FUTURE REFILLS .  metFORMIN (GLUCOPHAGE) 500 MG tablet, Take 500 mg by mouth 2 (two) times daily with a meal.  Current Outpatient Medications (Cardiovascular):  .  amLODipine  (NORVASC) 5 MG tablet, Take 1 tablet (5 mg total) by mouth daily. Marland Kitchen  atorvastatin (LIPITOR) 10 MG tablet, TAKE 1 TABLET (10 MG TOTAL) BY MOUTH DAILY AT 6 PM. .  carvedilol (COREG) 3.125 MG tablet, TAKE 1 TABLET (3.125 MG TOTAL) BY MOUTH 2 (TWO) TIMES DAILY WITH A MEAL. Marland Kitchen  losartan (COZAAR) 100 MG tablet, TAKE 1 TABLET EVERY DAY .  nitroGLYCERIN (NITROSTAT) 0.4 MG SL tablet, Place 1 tablet (0.4 mg total) under the tongue every 5 (five) minutes as needed for chest pain.  Current Outpatient Medications (Respiratory):  .  triamcinolone (NASACORT) 55 MCG/ACT AERO nasal inhaler, Place 2 sprays into the nose daily.  Current Outpatient Medications (Analgesics):  .  aspirin EC 81 MG tablet, Take 81 mg by mouth daily after supper.   Current Outpatient Medications (Other):  Marland Kitchen  ACCU-CHEK AVIVA PLUS test strip, USE FOR TESTING UP TO 4 TIMES DAILY AS DIRECTED .  blood glucose meter kit and supplies, Dispense based on patient and insurance preference. Use up to four times daily as directed. (FOR ICD-10 E10.9, E11.9). Marland Kitchen  Blood Glucose Monitoring Suppl (ONE TOUCH ULTRA 2) w/Device KIT, Use as directed E 11.9 .  brimonidine (ALPHAGAN) 0.2 % ophthalmic solution, ADMINISTER 1 DROP TO BOTH EYES THREE (3) TIMES A DAY. .  finasteride (PROSCAR) 5 MG tablet, Take 5 mg by mouth daily.  Marland Kitchen  gabapentin (NEURONTIN) 100 MG capsule, Take 1 capsule (100 mg total) by mouth at bedtime. .  Lancets MISC, Use as directed twice daily E11.9 .  latanoprost (XALATAN) 0.005 % ophthalmic solution, Place 1 drop into both eyes at bedtime. .  pantoprazole (PROTONIX) 40 MG tablet, Take 1 tablet (40 mg total) by mouth daily. Marland Kitchen  triamcinolone ointment (KENALOG) 0.1 %,    Reviewed prior external information including notes and imaging from  primary care provider As well as notes that were available from care everywhere and other healthcare systems.  Past medical history, social, surgical and family history all reviewed in electronic  medical record.  No pertanent information unless stated regarding to the chief complaint.   Review of Systems:  No , visual changes, nausea, vomiting, diarrhea, constipation, dizziness, abdominal pain, skin rash, fevers, chills, night sweats, weight loss, swollen lymph nodes, body aches, joint swelling, chest pain, shortness of breath, mood changes. POSITIVE muscle aches numbness in the left arm, patient did complain of paresthesias of the face with primary care but states that that has improved a little to nearly not as severe as the arm denies any weakness of the left side of his face or slurring speech or difficulty swallowing.  Objective  Blood pressure 132/72, pulse (!) 57, height _0  (1.778 m), weight 154 lb (69.9 kg), SpO2 98 %.   General: No apparent distress alert and oriented x3 mood and affect normal, dressed appropriately.  HEENT: Pupils equal, extraocular movements intact  Respiratory: Patient's speak in full sentences and does not appear short of breath  Cardiovascular: No lower extremity edema, non tender, no erythema  Cranial nerves II through XII were intact noted today. Mild antalgic gait Arthritic changes of multiple joints Right shoulder exam shows the patient does have some limited external rotation.  Patient does have active range of motion only avoiding the external rotation of 10 degrees and internal rotation to sacrum.  Patient has actually very good strength of the rotator cuff.  Seems to be 4+ out of 5 compared to 5 out of 5 on the contralateral side.  Very minimal discomfort to palpation over the posterior aspect.  Minimal pain over the clavicle as well. Left shoulder fairly unremarkable.  Patient's neck though does have some loss of lordosis.  Mild positive Spurling's with limited sidebending bilaterally and limited left-sided rotation.  5 out of 5 strength of the hand on the left.  [id number]; 15 additional minutes spent for Therapeutic exercises as stated in above  notes.  This included exercises focusing on stretching, strengthening, with significant focus on eccentric aspects.   Long term goals include an improvement in range of motion, strength, endurance as well as avoiding reinjury. Patient's frequency would include in 1-2 times a day, 3-5 times a week for a duration of 6-12 weeks. Exercises  that included:  Basic scapular stabilization to include adduction and depression of scapula Scaption, focusing on proper movement and good control Internal and External rotation utilizing a theraband, with elbow tucked at side entire time Rows with theraband   Proper technique shown and discussed handout in great detail with ATC.  All questions were discussed and answered.     Impression and Recommendations:     The above documentation has been reviewed and is accurate and complete Lyndal Pulley, DO

## 2020-10-27 NOTE — Patient Instructions (Addendum)
Gabapentin 100mg  at night Xray today We talked about shoulder and discussed imaging vs exercises and you chose exercises Ice after activity Check in again in 3-4 weeks and we will get another xray at that time

## 2020-10-27 NOTE — Assessment & Plan Note (Signed)
Patient does have a finding of the potential superior glenoid fracture that seems to be subacute.  Rotator cuff appears to be intact.  We discussed with patient that he is improving with range of motion he states already.  Given some range of motion home exercises.  We discussed potential physical therapy but he would like to start with home exercises.  We also discussed the potential for CT scan which patient declined.  At this point we will start with this therapy have him increase activity and follow-up in 4 to 8 weeks.

## 2020-10-27 NOTE — Assessment & Plan Note (Addendum)
Concerned the fall may have gotten patient some mild cervical radiculopathy.  No weakness noted.  Patient though is having the paresthesia. Does appear reviewing notes patient has complained fo this since June of 2020.   Patient was having more tingling noted of the face when he did talk to primary care provider but states seems like that is improved.    Patient has a stat MRI of the brain is scheduled for next week.  Ddx include cervical radiculitis, CTS, ?DM nueropathy but usually not unilateral. Warned of any signs of a CVA and when to seek medical attention including, dizziness, facial droop, chest pain, slurred speech, visual changes weakness of extremity.  I highly think this is more cervical and x-rays are pending.  Patient given a very low dose of gabapentin that I think will be beneficial and help with his sleep which he states has been difficult and may be another cause of symptoms.   Patient states most of it seems to be more of a nighttime aspect for exacerbation and better with activity.  Patient will follow up with me again in 4 weeks

## 2020-10-31 ENCOUNTER — Ambulatory Visit
Admission: RE | Admit: 2020-10-31 | Discharge: 2020-10-31 | Disposition: A | Discharge status: Home / Self Care | Payer: Medicare HMO | Source: Ambulatory Visit | Attending: Internal Medicine | Admitting: Internal Medicine

## 2020-10-31 ENCOUNTER — Other Ambulatory Visit: Payer: Self-pay

## 2020-10-31 DIAGNOSIS — R220 Localized swelling, mass and lump, head: Secondary | ICD-10-CM | POA: Diagnosis not present

## 2020-10-31 DIAGNOSIS — Z8673 Personal history of transient ischemic attack (TIA), and cerebral infarction without residual deficits: Secondary | ICD-10-CM | POA: Diagnosis not present

## 2020-10-31 DIAGNOSIS — R202 Paresthesia of skin: Secondary | ICD-10-CM

## 2020-10-31 DIAGNOSIS — R200 Anesthesia of skin: Secondary | ICD-10-CM

## 2020-10-31 DIAGNOSIS — G319 Degenerative disease of nervous system, unspecified: Secondary | ICD-10-CM | POA: Diagnosis not present

## 2020-11-02 ENCOUNTER — Other Ambulatory Visit: Payer: Self-pay | Admitting: Internal Medicine

## 2020-11-02 ENCOUNTER — Encounter: Payer: Self-pay | Admitting: Internal Medicine

## 2020-11-02 DIAGNOSIS — G9389 Other specified disorders of brain: Secondary | ICD-10-CM

## 2020-11-07 ENCOUNTER — Telehealth: Payer: Self-pay | Admitting: Internal Medicine

## 2020-11-07 NOTE — Telephone Encounter (Signed)
Spoke with pt and was able to inform him of Dr. Gwynn Burly advise about his MRI along with Dr.John has sent over a referral to NeuroSurgery for further eval of Spot on brian that was seen on pts MRI.

## 2020-11-07 NOTE — Telephone Encounter (Signed)
    Please call patient to discuss MRI results 

## 2020-11-07 NOTE — Telephone Encounter (Signed)
    Please call patient again to discuss MRI results, his daughter states patient was in the grocery store and could not hear results and doesn't understand plan of care

## 2020-11-08 NOTE — Telephone Encounter (Signed)
I was able to call the pt back and re-discuss thr MRI findings and next steps per Dr. Gwynn Burly notes. Pt states he understands and has no questions or concerns at this time.

## 2020-11-14 ENCOUNTER — Other Ambulatory Visit: Payer: Self-pay | Admitting: Physician Assistant

## 2020-11-14 ENCOUNTER — Other Ambulatory Visit: Payer: Self-pay | Admitting: Internal Medicine

## 2020-11-14 NOTE — Telephone Encounter (Signed)
Please refill as per office routine med refill policy (all routine meds refilled for 3 mo or monthly per pt preference up to one year from last visit, then month to month grace period for 3 mo, then further med refills will have to be denied)  

## 2020-11-15 NOTE — Progress Notes (Signed)
[facility] [address] [place] Phone: [phone] Subjective:   I Keith Herrera am serving as a Education administrator for Dr. Hulan Saas.  This visit occurred during the SARS-CoV-2 public health emergency.  Safety protocols were in place, including screening questions prior to the visit, additional usage of staff PPE, and extensive cleaning of exam room while observing appropriate contact time as indicated for disinfecting solutions.   I'm seeing this patient by the request  of:  [name], MD  CC: Shoulder and neck pain follow-up  JSH:[id number]   10/27/2020 Concerned the fall may have gotten patient some mild cervical radiculopathy.  No weakness noted.  Patient though is having the paresthesia. Does appear reviewing notes patient has complained fo this since June of 2020.   Patient was having more tingling noted of the face when he did talk to primary care provider but states seems like that is improved.    Patient has a stat MRI of the brain is scheduled for next week.  Ddx include cervical radiculitis, CTS, ?DM nueropathy but usually not unilateral. Warned of any signs of a CVA and when to seek medical attention including, dizziness, facial droop, chest pain, slurred speech, visual changes weakness of extremity.  I highly think this is more cervical and x-rays are pending.  Patient given a very low dose of gabapentin that I think will be beneficial and help with his sleep which he states has been difficult and may be another cause of symptoms.   Patient states most of it seems to be more of a nighttime aspect for exacerbation and better with activity.  Patient will follow up with me again in 4 weeks  Patient does have a finding of the potential superior glenoid fracture that seems to be subacute.  Rotator cuff appears to be intact.  We discussed with patient that he is improving with range of motion he states already.  Given some range of motion home  exercises.  We discussed potential physical therapy but he would like to start with home exercises.  We also discussed the potential for CT scan which patient declined.  At this point we will start with this therapy have him increase activity and follow-up in 4 to 8 weeks.  Update 11/16/2020 TAUHEED MCFAYDEN is a 86 y.o. male coming in with complaint of right shoulder and neck pain. Patient states he is still sore but making progress.  Patient feels like he has made a 75% improvement.  States that he was able to ride his tractor and has been using his backpack leaf blower with no significant discomfort.  Pain is only at night.  Gabapentin 100 mg has helped somewhat.  States that as long as he does the exercises he feels like he is making improvement.     Patient did have an MRI of the brain done on October 31, 2020.  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  X-rays of the cervical spine show the patient does have moderate loss of the degenerative disc from C3-T1 as well as facet arthropathy from C5-C7.  Past Medical History:  Diagnosis Date  . Adenomatous polyp of colon 05/2004  . Allergic rhinitis   . Anxiety   . BPH (benign prostatic hyperplasia)   . CAD (coronary artery disease)    s/p Cypher DES to LAD and pD1 2010;  LHC was done 6/12: EF 65%, circumflex patent,  mid RCA 80-90% (small and nondominant), LAD and diagonal stents patent, LAD at the origin of the first diagonal 30%, ostial D2 90%, mid 80-90% (small vessel).  Continued medical therapy was recommended.   . Carotid stenosis    dopplers 02/2012: 0-93% RICA; 81-82% LICA  . Diabetes type 2, controlled ([place])   . Diverticulosis    colon  . ED (erectile dysfunction)   . Esophageal stricture   . GERD (gastroesophageal reflux disease)   . Hemorrhoids   . Hiatal hernia   . Hyperlipidemia   . Hypertension   . IBS (irritable bowel syndrome)   . PVC's (premature ventricular  contractions) 01/21/2020   Monitor 07/2019: PVC burden 21.6% // Echocardiogram 01/2020: EF 65-70, no RWMA, Gr 2 DD, normal RVSF, RVSP 43.3 mmHg (mod pul HTN), mild LAE, mild MR, trivial AI  . Right inguinal hernia    s/p repair in 8/12  . SVT (supraventricular tachycardia) (HCC)    Past Surgical History:  Procedure Laterality Date  . CARDIAC CATHETERIZATION N/A 12/10/2016   Procedure: Left Heart Cath and Coronary Angiography;  Surgeon: Jettie Booze, MD;  Location: [facility];  Service: Cardiovascular;  Laterality: N/A;  . CARDIAC CATHETERIZATION N/A 12/10/2016   Procedure: Coronary Balloon Angioplasty;  Surgeon: Jettie Booze, MD;  Location: [facility];  Service: Cardiovascular;  Laterality: N/A;  . CARDIAC CATHETERIZATION N/A 12/10/2016   Procedure: Intravascular Pressure Wire/FFR Study;  Surgeon: Jettie Booze, MD;  Location: [facility];  Service: Cardiovascular;  Laterality: N/A;  . CORONARY STENT PLACEMENT  10/2008   x 2  . ELECTROPHYSIOLOGIC STUDY N/A 03/20/2015   no inducible SVT - Dr Lovena Le  . INGUINAL HERNIA REPAIR Right    RIH with ultrapro patch   Social History   Socioeconomic History  . Marital status: Married    Spouse name: Not on file  . Number of children: 3  . Years of education: Not on file  . Highest education level: Not on file  Occupational History  . 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 Smoking status: Former Smoker    Types: Cigarettes    Quit date: 03/05/1956    Years since quitting: 64.7  . Smokeless tobacco: Never Used  . Tobacco comment: smoked in teens  Vaping Use  . Vaping Use: Never used  Substance and Sexual Activity  . Alcohol use: No    Alcohol/week: 0.0 standard drinks  . Drug use: No  . Sexual activity: Not on file  Other Topics Concern  . Not on file  Social History Narrative   Lives with wife in a 3 story home.  Has 2 living children.  1 passed away in a car  accident.     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     Social Determinants of Health   Financial Resource Strain: Not on file  Food Insecurity: Not on file  Transportation Needs: Not on file  Physical Activity: Not on file  Stress: Not on file  Social Connections: Not on file   Allergies  Allergen Reactions  . Crestor [Rosuvastatin Calcium] Rash    All over body  . Lovastatin Itching and Rash   Family History  Problem Relation Age of Onset  . Bladder Cancer Mother 4  . Heart attack Father 72       Deceased  . Pancreatic  cancer Sister 46  . Healthy Son   . Healthy Daughter   . Colon cancer Neg Hx     Current Outpatient Medications (Endocrine & Metabolic):  .  empagliflozin (JARDIANCE) 10 MG TABS tablet, Take 1 tablet (10 mg total) by mouth daily before breakfast. .  glipiZIDE (GLUCOTROL XL) 5 MG 24 hr tablet, TAKE 1 TABLET EVERY DAY WITH BREAKFAST ANNUAL APPT DUE IN JULY MUST SEE PROVIDER FOR FUTURE REFILLS .  metFORMIN (GLUCOPHAGE) 500 MG tablet, Take 500 mg by mouth 2 (two) times daily with a meal.  Current Outpatient Medications (Cardiovascular):  .  amLODipine (NORVASC) 5 MG tablet, TAKE 1 TABLET BY MOUTH EVERY DAY .  atorvastatin (LIPITOR) 10 MG tablet, TAKE 1 TABLET (10 MG TOTAL) BY MOUTH DAILY AT 6 PM. .  carvedilol (COREG) 3.125 MG tablet, TAKE 1 TABLET (3.125 MG TOTAL) BY MOUTH 2 (TWO) TIMES DAILY WITH A MEAL. Marland Kitchen  losartan (COZAAR) 100 MG tablet, TAKE 1 TABLET EVERY DAY .  nitroGLYCERIN (NITROSTAT) 0.4 MG SL tablet, Place 1 tablet (0.4 mg total) under the tongue every 5 (five) minutes as needed for chest pain.  Current Outpatient Medications (Respiratory):  .  triamcinolone (NASACORT) 55 MCG/ACT AERO nasal inhaler, Place 2 sprays into the nose daily.  Current Outpatient Medications (Analgesics):  .  aspirin EC 81 MG tablet, Take 81 mg by mouth daily after supper.   Current Outpatient Medications  (Other):  Marland Kitchen  ACCU-CHEK AVIVA PLUS test strip, USE FOR TESTING UP TO 4 TIMES DAILY AS DIRECTED .  blood glucose meter kit and supplies, Dispense based on patient and insurance preference. Use up to four times daily as directed. (FOR ICD-10 E10.9, E11.9). Marland Kitchen  Blood Glucose Monitoring Suppl (ONE TOUCH ULTRA 2) w/Device KIT, Use as directed E 11.9 .  brimonidine (ALPHAGAN) 0.2 % ophthalmic solution, ADMINISTER 1 DROP TO BOTH EYES THREE (3) TIMES A DAY. .  finasteride (PROSCAR) 5 MG tablet, Take 5 mg by mouth daily.  Marland Kitchen  gabapentin (NEURONTIN) 100 MG capsule, Take 1 capsule (100 mg total) by mouth at bedtime. .  Lancets MISC, Use as directed twice daily E11.9 .  latanoprost (XALATAN) 0.005 % ophthalmic solution, Place 1 drop into both eyes at bedtime. .  pantoprazole (PROTONIX) 40 MG tablet, Take 1 tablet (40 mg total) by mouth daily. Marland Kitchen  triamcinolone ointment (KENALOG) 0.1 %,    Reviewed prior external information including notes and imaging from  primary care provider As well as notes that were available from care everywhere and other healthcare systems.  Past medical history, social, surgical and family history all reviewed in electronic medical record.  No pertanent information unless stated regarding to the chief complaint.   Review of Systems:  No headache, visual changes, nausea, vomiting, diarrhea, constipation, dizziness, abdominal pain, skin rash, fevers, chills, night sweats, weight loss, swollen lymph nodes, body aches, joint swelling, chest pain, shortness of breath, mood changes. POSITIVE muscle aches  Objective  Blood pressure 120/78, pulse (!) 55, height _0  (1.778 m), weight 156 lb (70.8 kg), SpO2 100 %.   General: No apparent distress alert and oriented x3 mood and affect normal, dressed appropriately.  HEENT: Pupils equal, extraocular movements intact  Respiratory: Patient's speak in full sentences and does not appear short of breath  Cardiovascular: No lower extremity  edema, non tender, no erythema  Patient's right shoulder does have some improvement in range of motion.  Patient has 5 out of 5 strength of the rotator cuff.  Patient  does still have some mild pain with crossover.  Neck exam still has loss of lordosis but negative Spurling's.  Regarding patient's right shoulder exam patient does have some very mild weakness with external rotation.  Patient has 5 out of 5 strength of the hands bilaterally.  No significant weakness noted at the moment of the left upper extremity.      Impression and Recommendations:     The above documentation has been reviewed and is accurate and complete Lyndal Pulley, DO

## 2020-11-16 ENCOUNTER — Encounter: Payer: Self-pay | Admitting: Family Medicine

## 2020-11-16 ENCOUNTER — Ambulatory Visit (INDEPENDENT_AMBULATORY_CARE_PROVIDER_SITE_OTHER): Payer: Medicare HMO | Admitting: Family Medicine

## 2020-11-16 ENCOUNTER — Other Ambulatory Visit: Payer: Self-pay

## 2020-11-16 DIAGNOSIS — M5010 Cervical disc disorder with radiculopathy, unspecified cervical region: Secondary | ICD-10-CM

## 2020-11-16 DIAGNOSIS — R202 Paresthesia of skin: Secondary | ICD-10-CM | POA: Diagnosis not present

## 2020-11-16 DIAGNOSIS — M25511 Pain in right shoulder: Secondary | ICD-10-CM

## 2020-11-16 NOTE — Patient Instructions (Addendum)
Good to see you Glad you are 75% better Continue exercises Can continue gabapentin if it is helping at night Once you don't need it discontinue If worsening pain we can consider PT Ice at night might help See me again in 7-8 weeks

## 2020-11-16 NOTE — Assessment & Plan Note (Signed)
Patient does have good improvement with the strength.  Patient's neck still has some mild limited limited range of motion.  Gabapentin is helping him with nighttime and is states that he is feeling 75 to 80% better at this time.  Patient is very active and has even been driving his tractor and using a backpack powered leaf blower.  At this point he would like to continue the conservative therapy.  Once again discussed the possibility of advanced imaging which patient declined also discussed potential physical therapy which patient declined.  No side effects to the low-dose of the gabapentin.  Follow-up with me again in 7 to 8 weeks.

## 2020-11-16 NOTE — Assessment & Plan Note (Addendum)
Patient initially was seen by primary care provider for this and was sent for an MRI.  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  No significant weakness.  Patient states none of the symptoms is worsening and if anything a little bit improvement.  Patient is seen neurosurgery next week.  Given signs and symptoms and when to seek medical attention more urgently.  Patient no feels like he is stable.

## 2020-11-16 NOTE — Assessment & Plan Note (Signed)
Patient does have some arthritic changes but nothing severe.

## 2020-11-23 DIAGNOSIS — D496 Neoplasm of unspecified behavior of brain: Secondary | ICD-10-CM | POA: Insufficient documentation

## 2020-11-23 DIAGNOSIS — I10 Essential (primary) hypertension: Secondary | ICD-10-CM | POA: Insufficient documentation

## 2020-11-28 ENCOUNTER — Other Ambulatory Visit: Payer: Self-pay

## 2020-11-29 ENCOUNTER — Ambulatory Visit (INDEPENDENT_AMBULATORY_CARE_PROVIDER_SITE_OTHER): Payer: Medicare HMO | Admitting: Internal Medicine

## 2020-11-29 ENCOUNTER — Encounter: Payer: Self-pay | Admitting: Internal Medicine

## 2020-11-29 VITALS — BP 138/62 | HR 54 | Temp 97.6°F | Wt 159.4 lb

## 2020-11-29 DIAGNOSIS — Z0001 Encounter for general adult medical examination with abnormal findings: Secondary | ICD-10-CM | POA: Diagnosis not present

## 2020-11-29 DIAGNOSIS — E785 Hyperlipidemia, unspecified: Secondary | ICD-10-CM

## 2020-11-29 DIAGNOSIS — I10 Essential (primary) hypertension: Secondary | ICD-10-CM

## 2020-11-29 DIAGNOSIS — E538 Deficiency of other specified B group vitamins: Secondary | ICD-10-CM | POA: Diagnosis not present

## 2020-11-29 DIAGNOSIS — E559 Vitamin D deficiency, unspecified: Secondary | ICD-10-CM

## 2020-11-29 DIAGNOSIS — G9389 Other specified disorders of brain: Secondary | ICD-10-CM | POA: Diagnosis not present

## 2020-11-29 DIAGNOSIS — E1159 Type 2 diabetes mellitus with other circulatory complications: Secondary | ICD-10-CM

## 2020-11-29 LAB — CBC WITH DIFFERENTIAL/PLATELET
Basophils Absolute: 0.1 10*3/uL (ref 0.0–0.1)
Basophils Relative: 0.9 % (ref 0.0–3.0)
Eosinophils Absolute: 0.3 10*3/uL (ref 0.0–0.7)
Eosinophils Relative: 3.5 % (ref 0.0–5.0)
HCT: 45.2 % (ref 39.0–52.0)
Hemoglobin: 15.2 g/dL (ref 13.0–17.0)
Lymphocytes Relative: 25 % (ref 12.0–46.0)
Lymphs Abs: 1.9 10*3/uL (ref 0.7–4.0)
MCHC: 33.7 g/dL (ref 30.0–36.0)
MCV: 89 fl (ref 78.0–100.0)
Monocytes Absolute: 0.6 10*3/uL (ref 0.1–1.0)
Monocytes Relative: 8.3 % (ref 3.0–12.0)
Neutro Abs: 4.8 10*3/uL (ref 1.4–7.7)
Neutrophils Relative %: 62.3 % (ref 43.0–77.0)
Platelets: 196 10*3/uL (ref 150.0–400.0)
RBC: 5.08 Mil/uL (ref 4.22–5.81)
RDW: 13.5 % (ref 11.5–15.5)
WBC: 7.8 10*3/uL (ref 4.0–10.5)

## 2020-11-29 LAB — BASIC METABOLIC PANEL
BUN: 18 mg/dL (ref 6–23)
CO2: 29 mEq/L (ref 19–32)
Calcium: 9.7 mg/dL (ref 8.4–10.5)
Chloride: 103 mEq/L (ref 96–112)
Creatinine, Ser: 0.99 mg/dL (ref 0.40–1.50)
GFR: 68.88 mL/min (ref >60.00)
Glucose, Bld: 185 mg/dL — ABNORMAL HIGH (ref 70–99)
Potassium: 4.3 mEq/L (ref 3.5–5.1)
Sodium: 139 mEq/L (ref 135–145)

## 2020-11-29 LAB — LIPID PANEL
Cholesterol: 149 mg/dL (ref 0–200)
HDL: 39.4 mg/dL (ref >39.00)
LDL Cholesterol: 76 mg/dL (ref 0–99)
NonHDL: 109.21
Total CHOL/HDL Ratio: 4
Triglycerides: 166 mg/dL — ABNORMAL HIGH (ref 0.0–149.0)
VLDL: 33.2 mg/dL (ref 0.0–40.0)

## 2020-11-29 LAB — HEPATIC FUNCTION PANEL
ALT: 22 U/L (ref 0–53)
AST: 17 U/L (ref 0–37)
Albumin: 4.5 g/dL (ref 3.5–5.2)
Alkaline Phosphatase: 48 U/L (ref 39–117)
Bilirubin, Direct: 0.2 mg/dL (ref 0.0–0.3)
Total Bilirubin: 0.8 mg/dL (ref 0.2–1.2)
Total Protein: 7.1 g/dL (ref 6.0–8.3)

## 2020-11-29 LAB — HEMOGLOBIN A1C: Hgb A1c MFr Bld: 8.4 % — ABNORMAL HIGH (ref 4.6–6.5)

## 2020-11-29 LAB — VITAMIN B12: Vitamin B-12: 264 pg/mL (ref 211–911)

## 2020-11-29 LAB — TSH: TSH: 2.6 u[IU]/mL (ref 0.35–4.50)

## 2020-11-29 LAB — VITAMIN D 25 HYDROXY (VIT D DEFICIENCY, FRACTURES): VITD: 45 ng/mL (ref 30.00–100.00)

## 2020-11-29 NOTE — Progress Notes (Signed)
Established Patient Office Visit  Subjective:  Patient ID: Keith Herrera, male    DOB: 02/16/1932  Age: 87 y.o. MRN: [id number]       Chief Complaint:: wellness exam and HLD uncontrolled, DM, brain mass       HPI:  Keith Herrera is a 87 y.o. male here for wellness exam       Wt Readings from Last 3 Encounters:  11/29/20 159 lb 5.8 oz (72.3 kg)  11/16/20 156 lb (70.8 kg)  10/27/20 154 lb (69.9 kg)   BP Readings from Last 3 Encounters:  11/29/20 138/62  11/16/20 120/78  10/27/20 132/72          Past Medical History:  Diagnosis Date  . Adenomatous polyp of colon 05/2004  . Allergic rhinitis   . Anxiety   . BPH (benign prostatic hyperplasia)   . CAD (coronary artery disease)    s/p Cypher DES to LAD and pD1 2010;  LHC was done 6/12: EF 65%, circumflex patent, mid RCA 80-90% (small and nondominant), LAD and diagonal stents patent, LAD at the origin of the first diagonal 30%, ostial D2 90%, mid 80-90% (small vessel).  Continued medical therapy was recommended.   . Carotid stenosis    dopplers 02/2012: 3-66% RICA; 29-47% LICA  . Diabetes type 2, controlled ([place])   . Diverticulosis    colon  . ED (erectile dysfunction)   . Esophageal stricture   . GERD (gastroesophageal reflux disease)   . Hemorrhoids   . Hiatal hernia   . Hyperlipidemia   . Hypertension   . IBS (irritable bowel syndrome)   . PVC's (premature ventricular contractions) 01/21/2020   Monitor 07/2019: PVC burden 21.6% // Echocardiogram 01/2020: EF 65-70, no RWMA, Gr 2 DD, normal RVSF, RVSP 43.3 mmHg (mod pul HTN), mild LAE, mild MR, trivial AI  . Right inguinal hernia    s/p repair in 8/12  . SVT (supraventricular tachycardia) (HCC)    Past Surgical History:  Procedure Laterality Date  . CARDIAC CATHETERIZATION N/A 12/10/2016   Procedure: Left Heart Cath and Coronary Angiography;  Surgeon: Jettie Booze, MD;  Location: [facility];  Service: Cardiovascular;  Laterality: N/A;  . CARDIAC  CATHETERIZATION N/A 12/10/2016   Procedure: Coronary Balloon Angioplasty;  Surgeon: Jettie Booze, MD;  Location: [facility];  Service: Cardiovascular;  Laterality: N/A;  . CARDIAC CATHETERIZATION N/A 12/10/2016   Procedure: Intravascular Pressure Wire/FFR Study;  Surgeon: Jettie Booze, MD;  Location: [facility];  Service: Cardiovascular;  Laterality: N/A;  . CORONARY STENT PLACEMENT  10/2008   x 2  . ELECTROPHYSIOLOGIC STUDY N/A 03/20/2015   no inducible SVT - Dr Lovena Le  . INGUINAL HERNIA REPAIR Right    RIH with ultrapro patch    reports that he quit smoking about 64 years ago. His smoking use included cigarettes. He has never used smokeless tobacco. He reports that he does not drink alcohol and does not use drugs. family history includes Bladder Cancer (age of onset: 65) in his mother; Healthy in his daughter and son; Heart attack (age of onset: 5) in his father; Pancreatic cancer (age of onset: 14) in his sister. Allergies  Allergen Reactions  . Crestor [Rosuvastatin Calcium] Rash    All over body  . Lovastatin Itching and Rash   Current Outpatient Medications on File Prior to Visit  Medication Sig Dispense Refill  . ACCU-CHEK AVIVA PLUS test strip USE FOR TESTING UP TO 4 TIMES  DAILY AS DIRECTED 100 strip 2  . amLODipine (NORVASC) 5 MG tablet TAKE 1 TABLET BY MOUTH EVERY DAY 90 tablet 3  . aspirin EC 81 MG tablet Take 81 mg by mouth daily after supper.    Marland Kitchen atorvastatin (LIPITOR) 10 MG tablet TAKE 1 TABLET (10 MG TOTAL) BY MOUTH DAILY AT 6 PM. 90 tablet 1  . blood glucose meter kit and supplies Dispense based on patient and insurance preference. Use up to four times daily as directed. (FOR ICD-10 E10.9, E11.9). 1 each 0  . Blood Glucose Monitoring Suppl (ONE TOUCH ULTRA 2) w/Device KIT Use as directed E 11.9 1 each 0  . brimonidine (ALPHAGAN) 0.2 % ophthalmic solution ADMINISTER 1 DROP TO BOTH EYES THREE (3) TIMES A DAY.  3  . carvedilol (COREG) 3.125 MG  tablet TAKE 1 TABLET (3.125 MG TOTAL) BY MOUTH 2 (TWO) TIMES DAILY WITH A MEAL. 180 tablet 3  . finasteride (PROSCAR) 5 MG tablet Take 5 mg by mouth daily.     Marland Kitchen glipiZIDE (GLUCOTROL XL) 5 MG 24 hr tablet TAKE 1 TABLET EVERY DAY WITH BREAKFAST ANNUAL APPT DUE IN JULY MUST SEE PROVIDER FOR FUTURE REFILLS 90 tablet 1  . Lancets MISC Use as directed twice daily E11.9 200 each 3  . latanoprost (XALATAN) 0.005 % ophthalmic solution Place 1 drop into both eyes at bedtime.  12  . losartan (COZAAR) 100 MG tablet TAKE 1 TABLET EVERY DAY 90 tablet 1  . metFORMIN (GLUCOPHAGE) 500 MG tablet Take 500 mg by mouth 2 (two) times daily with a meal.    . nitroGLYCERIN (NITROSTAT) 0.4 MG SL tablet Place 1 tablet (0.4 mg total) under the tongue every 5 (five) minutes as needed for chest pain. 25 tablet 3  . pantoprazole (PROTONIX) 40 MG tablet Take 1 tablet (40 mg total) by mouth daily. 90 tablet 3  . triamcinolone (NASACORT) 55 MCG/ACT AERO nasal inhaler Place 2 sprays into the nose daily. 1 Inhaler 12  . triamcinolone ointment (KENALOG) 0.1 %      No current facility-administered medications on file prior to visit.        ROS:  All others reviewed and negative.  Objective        PE:  BP 138/62 (BP Location: Left Arm)   Pulse (!) 54   Temp 97.6 F (36.4 C) (Oral)   Wt 159 lb 5.8 oz (72.3 kg)   SpO2 98%   BMI 22.87 kg/m                 Constitutional: Pt appears in NAD               HENT: Head: NCAT.                Right Ear: External ear normal.                 Left Ear: External ear normal.                Eyes: . Pupils are equal, round, and reactive to light. Conjunctivae and EOM are normal               Nose: without d/c or deformity               Neck: Neck supple. Gross normal ROM               Cardiovascular: Normal rate and regular rhythm.  Pulmonary/Chest: Effort normal and breath sounds without rales or wheezing.                Abd:  Soft, NT, ND, + BS, no organomegaly                Neurological: Pt is alert. At baseline orientation, motor grossly intact               Skin: Skin is warm. No rashes, no other new lesions, LE edema - none               Psychiatric: Pt behavior is normal without agitation   Assessment/Plan:  Keith Herrera is a 87 y.o. White or Caucasian [1] male with  has a past medical history of Adenomatous polyp of colon (05/2004), Allergic rhinitis, Anxiety, BPH (benign prostatic hyperplasia), CAD (coronary artery disease), Carotid stenosis, Diabetes type 2, controlled ([place]), Diverticulosis, ED (erectile dysfunction), Esophageal stricture, GERD (gastroesophageal reflux disease), Hemorrhoids, Hiatal hernia, Hyperlipidemia, Hypertension, IBS (irritable bowel syndrome), PVC's (premature ventricular contractions) (01/21/2020), Right inguinal hernia, and SVT (supraventricular tachycardia) ([place]).    Assessment Plan  See notes Labs reviewed for each problem: Lab Results  Component Value Date   WBC 7.8 11/29/2020   HGB 15.2 11/29/2020   HCT 45.2 11/29/2020   PLT 196.0 11/29/2020   GLUCOSE 185 (H) 11/29/2020   CHOL 149 11/29/2020   TRIG 166.0 (H) 11/29/2020   HDL 39.40 11/29/2020   LDLDIRECT 82.0 05/19/2018   LDLCALC 76 11/29/2020   ALT 22 11/29/2020   AST 17 11/29/2020   NA 139 11/29/2020   K 4.3 11/29/2020   CL 103 11/29/2020   CREATININE 0.99 11/29/2020   BUN 18 11/29/2020   CO2 29 11/29/2020   TSH 2.60 11/29/2020   PSA 2.49 12/21/2012   INR 1.15 12/10/2016   HGBA1C 8.4 (H) 11/29/2020   MICROALBUR 3.5 (H) 11/30/2019    Micro: none  Cardiac tracings I have personally interpreted today:  none  Pertinent Radiological findings (summarize): 12 14 2021 MRI brain  IMPRESSION: No evidence of acute or remote insult.  Mild cerebral atrophy and chronic microvascular ischemic changes.  1.1 cm right temporal convexity mass. Consider postcontrast MRI for further evaluation.    There are no preventive care reminders to display for this  patient.  There are no preventive care reminders to display for this patient.  Lab Results  Component Value Date   TSH 2.60 11/29/2020   Lab Results  Component Value Date   WBC 7.8 11/29/2020   HGB 15.2 11/29/2020   HCT 45.2 11/29/2020   MCV 89.0 11/29/2020   PLT 196.0 11/29/2020   Lab Results  Component Value Date   NA 139 11/29/2020   K 4.3 11/29/2020   CO2 29 11/29/2020   GLUCOSE 185 (H) 11/29/2020   BUN 18 11/29/2020   CREATININE 0.99 11/29/2020   BILITOT 0.8 11/29/2020   ALKPHOS 48 11/29/2020   AST 17 11/29/2020   ALT 22 11/29/2020   PROT 7.1 11/29/2020   ALBUMIN 4.5 11/29/2020   CALCIUM 9.7 11/29/2020   ANIONGAP 9 12/11/2016   GFR 68.88 11/29/2020   Lab Results  Component Value Date   CHOL 149 11/29/2020   Lab Results  Component Value Date   HDL 39.40 11/29/2020   Lab Results  Component Value Date   LDLCALC 76 11/29/2020   Lab Results  Component Value Date   TRIG 166.0 (H) 11/29/2020   Lab Results  Component Value Date  CHOLHDL 4 11/29/2020   Lab Results  Component Value Date   HGBA1C 8.4 (H) 11/29/2020      Assessment & Plan:   Problem List Items Addressed This Visit      High   Encounter for well adult exam with abnormal findings - Primary    Overall doing well, age appropriate education and counseling updated, referrals for preventative services and immunizations addressed, dietary and smoking counseling addressed, most recent labs reviewed.  I have personally reviewed and have noted:  1) the patient's medical and social history 2) The pt's use of alcohol, tobacco, and illicit drugs 3) The patient's current medications and supplements 4) Functional ability including ADL's, fall risk, home safety risk, hearing and visual impairment 5) Diet and physical activities 6) Evidence for depression or mood disorder 7) The patient's height, weight, and BMI have been recorded in the chart  I have made referrals, and provided counseling and  education based on review of the above         Medium   Type 2 diabetes mellitus with vascular disease ([place])    Lab Results  Component Value Date   HGBA1C 8.4 (H) 11/29/2020   Stable, pt to continue current medical treatment  - metformin, glucotrol, jardiance      Relevant Orders   Microalbumin / creatinine urine ratio   Hemoglobin A1c (Completed)   Lipid panel (Completed)   Hepatic function panel (Completed)   CBC with Differential/Platelet (Completed)   TSH (Completed)   Urinalysis, Routine w reflex microscopic (Completed)   Basic metabolic panel (Completed)   Hyperlipidemia    Has been uncontrolled, now on high dose lipitor, for f/u lab, cont low chol diet      Essential hypertension    BP Readings from Last 3 Encounters:  11/29/20 138/62  11/16/20 120/78  10/27/20 132/72   Stable, pt to continue medical treatment - norvasc, coreg, losartan  Current Outpatient Medications (Endocrine & Metabolic):  .  glipiZIDE (GLUCOTROL XL) 5 MG 24 hr tablet, TAKE 1 TABLET EVERY DAY WITH BREAKFAST ANNUAL APPT DUE IN JULY MUST SEE PROVIDER FOR FUTURE REFILLS .  metFORMIN (GLUCOPHAGE) 500 MG tablet, Take 500 mg by mouth 2 (two) times daily with a meal. .  empagliflozin (JARDIANCE) 25 MG TABS tablet, Take 1 tablet (25 mg total) by mouth daily before breakfast.  Current Outpatient Medications (Cardiovascular):  .  amLODipine (NORVASC) 5 MG tablet, TAKE 1 TABLET BY MOUTH EVERY DAY .  atorvastatin (LIPITOR) 10 MG tablet, TAKE 1 TABLET (10 MG TOTAL) BY MOUTH DAILY AT 6 PM. .  carvedilol (COREG) 3.125 MG tablet, TAKE 1 TABLET (3.125 MG TOTAL) BY MOUTH 2 (TWO) TIMES DAILY WITH A MEAL. Marland Kitchen  losartan (COZAAR) 100 MG tablet, TAKE 1 TABLET EVERY DAY .  nitroGLYCERIN (NITROSTAT) 0.4 MG SL tablet, Place 1 tablet (0.4 mg total) under the tongue every 5 (five) minutes as needed for chest pain.  Current Outpatient Medications (Respiratory):  .  triamcinolone (NASACORT) 55 MCG/ACT AERO nasal inhaler,  Place 2 sprays into the nose daily.  Current Outpatient Medications (Analgesics):  .  aspirin EC 81 MG tablet, Take 81 mg by mouth daily after supper.   Current Outpatient Medications (Other):  Marland Kitchen  ACCU-CHEK AVIVA PLUS test strip, USE FOR TESTING UP TO 4 TIMES DAILY AS DIRECTED .  blood glucose meter kit and supplies, Dispense based on patient and insurance preference. Use up to four times daily as directed. (FOR ICD-10 E10.9, E11.9). Marland Kitchen  Blood Glucose  Monitoring Suppl (ONE TOUCH ULTRA 2) w/Device KIT, Use as directed E 11.9 .  brimonidine (ALPHAGAN) 0.2 % ophthalmic solution, ADMINISTER 1 DROP TO BOTH EYES THREE (3) TIMES A DAY. .  finasteride (PROSCAR) 5 MG tablet, Take 5 mg by mouth daily.  .  Lancets MISC, Use as directed twice daily E11.9 .  latanoprost (XALATAN) 0.005 % ophthalmic solution, Place 1 drop into both eyes at bedtime. .  pantoprazole (PROTONIX) 40 MG tablet, Take 1 tablet (40 mg total) by mouth daily. Marland Kitchen  triamcinolone ointment (KENALOG) 0.1 %,          Low   Brain mass    Incidental, benign per neuro, no further tx at this time, will have f/u MRI at 57yr      Other Visit Diagnoses    Vitamin D deficiency       Relevant Orders   VITAMIN D 25 Hydroxy (Vit-D Deficiency, Fractures) (Completed)   B12 deficiency       Relevant Orders   Vitamin B12 (Completed)      No orders of the defined types were placed in this encounter.   Follow-up: Return in about 6 months (around 05/29/2021).   JCathlean Cower MD 11/29/2020 1:18 PM [facility]

## 2020-11-29 NOTE — Patient Instructions (Signed)

## 2020-11-30 ENCOUNTER — Encounter: Payer: Self-pay | Admitting: Internal Medicine

## 2020-11-30 LAB — URINALYSIS, ROUTINE W REFLEX MICROSCOPIC
Bilirubin Urine: NEGATIVE
Hgb urine dipstick: NEGATIVE
Ketones, ur: NEGATIVE
Leukocytes,Ua: NEGATIVE
Nitrite: NEGATIVE
RBC / HPF: NONE SEEN (ref 0–?)
Specific Gravity, Urine: 1.01 (ref 1.000–1.030)
Total Protein, Urine: NEGATIVE
Urine Glucose: 500 — AB
Urobilinogen, UA: 0.2 (ref 0.0–1.0)
WBC, UA: NONE SEEN (ref 0–?)
pH: 6 (ref 5.0–8.0)

## 2020-11-30 MED ORDER — EMPAGLIFLOZIN 25 MG PO TABS: 25.0000 mg | ORAL_TABLET | Freq: Every day | ORAL | 3 refills | Status: DC

## 2020-12-03 ENCOUNTER — Encounter: Payer: Self-pay | Admitting: Internal Medicine

## 2020-12-03 DIAGNOSIS — G9389 Other specified disorders of brain: Secondary | ICD-10-CM | POA: Insufficient documentation

## 2020-12-03 NOTE — Assessment & Plan Note (Signed)
Incidental, benign per neuro, no further tx at this time, will have f/u MRI at 64yr

## 2020-12-03 NOTE — Assessment & Plan Note (Signed)
Lab Results  Component Value Date   HGBA1C 8.4 (H) 11/29/2020   Stable, pt to continue current medical treatment  - metformin, glucotrol, jardiance

## 2020-12-03 NOTE — Assessment & Plan Note (Signed)
Has been uncontrolled, now on high dose lipitor, for f/u lab, cont low chol diet

## 2020-12-03 NOTE — Assessment & Plan Note (Signed)
BP Readings from Last 3 Encounters:  11/29/20 138/62  11/16/20 120/78  10/27/20 132/72   Stable, pt to continue medical treatment - norvasc, coreg, losartan  Current Outpatient Medications (Endocrine & Metabolic):  .  glipiZIDE (GLUCOTROL XL) 5 MG 24 hr tablet, TAKE 1 TABLET EVERY DAY WITH BREAKFAST ANNUAL APPT DUE IN JULY MUST SEE PROVIDER FOR FUTURE REFILLS .  metFORMIN (GLUCOPHAGE) 500 MG tablet, Take 500 mg by mouth 2 (two) times daily with a meal. .  empagliflozin (JARDIANCE) 25 MG TABS tablet, Take 1 tablet (25 mg total) by mouth daily before breakfast.  Current Outpatient Medications (Cardiovascular):  .  amLODipine (NORVASC) 5 MG tablet, TAKE 1 TABLET BY MOUTH EVERY DAY .  atorvastatin (LIPITOR) 10 MG tablet, TAKE 1 TABLET (10 MG TOTAL) BY MOUTH DAILY AT 6 PM. .  carvedilol (COREG) 3.125 MG tablet, TAKE 1 TABLET (3.125 MG TOTAL) BY MOUTH 2 (TWO) TIMES DAILY WITH A MEAL. Marland Kitchen  losartan (COZAAR) 100 MG tablet, TAKE 1 TABLET EVERY DAY .  nitroGLYCERIN (NITROSTAT) 0.4 MG SL tablet, Place 1 tablet (0.4 mg total) under the tongue every 5 (five) minutes as needed for chest pain.  Current Outpatient Medications (Respiratory):  .  triamcinolone (NASACORT) 55 MCG/ACT AERO nasal inhaler, Place 2 sprays into the nose daily.  Current Outpatient Medications (Analgesics):  .  aspirin EC 81 MG tablet, Take 81 mg by mouth daily after supper.   Current Outpatient Medications (Other):  Marland Kitchen  ACCU-CHEK AVIVA PLUS test strip, USE FOR TESTING UP TO 4 TIMES DAILY AS DIRECTED .  blood glucose meter kit and supplies, Dispense based on patient and insurance preference. Use up to four times daily as directed. (FOR ICD-10 E10.9, E11.9). Marland Kitchen  Blood Glucose Monitoring Suppl (ONE TOUCH ULTRA 2) w/Device KIT, Use as directed E 11.9 .  brimonidine (ALPHAGAN) 0.2 % ophthalmic solution, ADMINISTER 1 DROP TO BOTH EYES THREE (3) TIMES A DAY. .  finasteride (PROSCAR) 5 MG tablet, Take 5 mg by mouth daily.  .  Lancets  MISC, Use as directed twice daily E11.9 .  latanoprost (XALATAN) 0.005 % ophthalmic solution, Place 1 drop into both eyes at bedtime. .  pantoprazole (PROTONIX) 40 MG tablet, Take 1 tablet (40 mg total) by mouth daily. Marland Kitchen  triamcinolone ointment (KENALOG) 0.1 %,

## 2020-12-03 NOTE — Assessment & Plan Note (Signed)

## 2020-12-04 ENCOUNTER — Other Ambulatory Visit: Payer: Self-pay

## 2020-12-04 MED ORDER — EMPAGLIFLOZIN 25 MG PO TABS: 25.0000 mg | ORAL_TABLET | Freq: Every day | ORAL | 3 refills | Status: DC

## 2020-12-05 ENCOUNTER — Other Ambulatory Visit: Payer: Self-pay | Admitting: Internal Medicine

## 2020-12-06 DIAGNOSIS — Z0100 Encounter for examination of eyes and vision without abnormal findings: Secondary | ICD-10-CM | POA: Diagnosis not present

## 2020-12-23 ENCOUNTER — Other Ambulatory Visit: Payer: Self-pay | Admitting: Internal Medicine

## 2020-12-23 NOTE — Telephone Encounter (Signed)
Please refill as per office routine med refill policy (all routine meds refilled for 3 mo or monthly per pt preference up to one year from last visit, then month to month grace period for 3 mo, then further med refills will have to be denied)  

## 2020-12-28 ENCOUNTER — Ambulatory Visit: Payer: Medicare HMO | Admitting: Family Medicine

## 2021-01-05 ENCOUNTER — Other Ambulatory Visit: Payer: Self-pay | Admitting: Internal Medicine

## 2021-01-18 ENCOUNTER — Other Ambulatory Visit: Payer: Self-pay | Admitting: Internal Medicine

## 2021-01-18 NOTE — Telephone Encounter (Signed)
Please refill as per office routine med refill policy (all routine meds refilled for 3 mo or monthly per pt preference up to one year from last visit, then month to month grace period for 3 mo, then further med refills will have to be denied)  

## 2021-02-02 ENCOUNTER — Ambulatory Visit: Payer: Medicare HMO | Admitting: Cardiovascular Disease

## 2021-02-09 ENCOUNTER — Other Ambulatory Visit: Payer: Self-pay

## 2021-02-09 ENCOUNTER — Encounter: Payer: Self-pay | Admitting: Cardiovascular Disease

## 2021-02-09 ENCOUNTER — Ambulatory Visit: Payer: Medicare HMO | Admitting: Cardiovascular Disease

## 2021-02-09 VITALS — BP 136/62 | HR 57 | Ht 70.0 in | Wt 152.4 lb

## 2021-02-09 DIAGNOSIS — I493 Ventricular premature depolarization: Secondary | ICD-10-CM | POA: Diagnosis not present

## 2021-02-09 DIAGNOSIS — E782 Mixed hyperlipidemia: Secondary | ICD-10-CM | POA: Diagnosis not present

## 2021-02-09 DIAGNOSIS — I10 Essential (primary) hypertension: Secondary | ICD-10-CM | POA: Diagnosis not present

## 2021-02-09 DIAGNOSIS — I2510 Atherosclerotic heart disease of native coronary artery without angina pectoris: Secondary | ICD-10-CM

## 2021-02-09 NOTE — Patient Instructions (Signed)

## 2021-02-09 NOTE — Progress Notes (Signed)
Cardiology Office Note:    Date:  02/09/2021   ID:  Keith Herrera, DOB Aug 20, 1932, MRN [id number]  PCP:  [name], MD   [place]  Cardiologist:  Sherren Mocha, MD  Advanced Practice Provider:  No care team member to display Electrophysiologist:  Cristopher Peru, MD       Referring MD: [name], MD   Chief Complaint  Patient presents with  . Coronary Artery Disease    History of Present Illness:    Keith Herrera is a 87 y.o. male with a hx of:  Coronary artery disease  ? S/p DES to LAD and D1 in 2010 ? S/p POBA to D1 in 11/2016 (no stent due to prior PCI)  Carotid artery disease  PSVT ? EPS in 2016 >> no inducible SVT  PVCs ? Symptomatic; eval by Dr. Lovena Le in past (tx options limited due to CAD) ? Monitor 07/2019: PVC burden 21.6%  Hypertension   Hyperlipidemia   Diabetes mellitus 2   GERD   Esophageal stricture ? Prior hx of chest pain of GI origin  The patient is here alone today.  He is doing very well.  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  He remains fairly active with no exertional symptoms.  He specifically denies chest pain, shortness of breath, leg swelling, or heart palpitations.  He has mild dizziness when he first stands up but this is unchanged over time.  No other concerns today.  Tolerating his medications okay.  Past Medical History:  Diagnosis Date  . Adenomatous polyp of colon 05/2004  . Allergic rhinitis   . Anxiety   . BPH (benign prostatic hyperplasia)   . CAD (coronary artery disease)    s/p Cypher DES to LAD and pD1 2010;  LHC was done 6/12: EF 65%, circumflex patent, mid RCA 80-90% (small and nondominant), LAD and diagonal stents patent, LAD at the origin of the first diagonal 30%, ostial D2 90%, mid 80-90% (small vessel).  Continued medical therapy was recommended.   . Carotid stenosis    dopplers 02/2012: 2-13% RICA; 08-65% LICA  . Diabetes type 2, controlled  ([place])   . Diverticulosis    colon  . ED (erectile dysfunction)   . Esophageal stricture   . GERD (gastroesophageal reflux disease)   . Hemorrhoids   . Hiatal hernia   . Hyperlipidemia   . Hypertension   . IBS (irritable bowel syndrome)   . PVC's (premature ventricular contractions) 01/21/2020   Monitor 07/2019: PVC burden 21.6% // Echocardiogram 01/2020: EF 65-70, no RWMA, Gr 2 DD, normal RVSF, RVSP 43.3 mmHg (mod pul HTN), mild LAE, mild MR, trivial AI  . Right inguinal hernia    s/p repair in 8/12  . SVT (supraventricular tachycardia) (H[facility])     Past Surgical History:  Procedure Laterality Date  . CARDIAC CATHETERIZATION N/A 12/10/2016   Procedure: Left Heart Cath and Coronary Angiography;  Surgeon: Jettie Booze, MD;  Location: [facility];  Service: Cardiovascular;  Laterality: N/A;  . CARDIAC CATHETERIZATION N/A 12/10/2016   Procedure: Coronary Balloon Angioplasty;  Surgeon: Jettie Booze, MD;  Location: [facility];  Service: Cardiovascular;  Laterality: N/A;  . CARDIAC CATHETERIZATION N/A 12/10/2016   Procedure: Intravascular Pressure Wire/FFR Study;  Surgeon: Jettie Booze, MD;  Location: [facility];  Service: Cardiovascular;  Laterality: N/A;  . CORONARY STENT PLACEMENT  10/2008  x 2  . ELECTROPHYSIOLOGIC STUDY N/A 03/20/2015   no inducible SVT - Dr Lovena Le  . INGUINAL HERNIA REPAIR Right    RIH with ultrapro patch    Current Medications: Current Meds  Medication Sig  . ACCU-CHEK AVIVA PLUS test strip USE FOR TESTING UP TO 4 TIMES DAILY AS DIRECTED  . amLODipine (NORVASC) 5 MG tablet TAKE 1 TABLET BY MOUTH EVERY DAY  . aspirin EC 81 MG tablet Take 81 mg by mouth daily after supper.  Marland Kitchen atorvastatin (LIPITOR) 10 MG tablet TAKE 1 TABLET (10 MG TOTAL) BY MOUTH DAILY AT 6 PM.  . blood glucose meter kit and supplies Dispense based on patient and insurance preference. Use up to four times daily as directed. (FOR ICD-10 E10.9, E11.9).  Marland Kitchen Blood  Glucose Monitoring Suppl (ONE TOUCH ULTRA 2) w/Device KIT Use as directed E 11.9  . brimonidine (ALPHAGAN) 0.2 % ophthalmic solution ADMINISTER 1 DROP TO BOTH EYES THREE (3) TIMES A DAY.  . carvedilol (COREG) 3.125 MG tablet TAKE 1 TABLET (3.125 MG TOTAL) BY MOUTH 2 (TWO) TIMES DAILY WITH A MEAL.  Marland Kitchen empagliflozin (JARDIANCE) 25 MG TABS tablet Take 1 tablet (25 mg total) by mouth daily before breakfast.  . finasteride (PROSCAR) 5 MG tablet Take 5 mg by mouth daily.   Marland Kitchen glipiZIDE (GLUCOTROL XL) 5 MG 24 hr tablet TAKE 1 TABLET EVERY DAY WITH BREAKFAST ANNUAL APPT DUE IN JULY MUST SEE PROVIDER FOR FUTURE REFILLS  . Lancets MISC Use as directed twice daily E11.9  . latanoprost (XALATAN) 0.005 % ophthalmic solution Place 1 drop into both eyes at bedtime.  Marland Kitchen losartan (COZAAR) 100 MG tablet TAKE 1 TABLET EVERY DAY  . metFORMIN (GLUCOPHAGE) 500 MG tablet Take 500 mg by mouth 2 (two) times daily with a meal.  . nitroGLYCERIN (NITROSTAT) 0.4 MG SL tablet Place 1 tablet (0.4 mg total) under the tongue every 5 (five) minutes as needed for chest pain.  . pantoprazole (PROTONIX) 40 MG tablet TAKE 1 TABLET BY MOUTH EVERY DAY  . triamcinolone (NASACORT) 55 MCG/ACT AERO nasal inhaler Place 2 sprays into the nose daily.  Marland Kitchen triamcinolone ointment (KENALOG) 0.1 %      Allergies:   Crestor [rosuvastatin calcium] and Lovastatin   Social History   Socioeconomic History  . Marital status: Married    Spouse name: Not on file  . Number of children: 3  . Years of education: Not on file  . Highest education level: Not on file  Occupational History  . 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 Smoking status: Former Smoker    Types: Cigarettes    Quit date: 03/05/1956    Years since quitting: 64.9  . Smokeless tobacco: Never Used  . Tobacco comment: smoked in teens  Vaping Use  . Vaping Use: Never used  Substance and Sexual Activity  . Alcohol use: No    Alcohol/week: 0.0  standard drinks  . Drug use: No  . Sexual activity: Not on file  Other Topics Concern  . Not on file  Social History Narrative   Lives with wife in a 3 story home.  Has 2 living children.  1 passed away in a car accident.     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     Social Determinants of Health   Financial Resource Strain: Not on file  Food Insecurity: Not  on file  Transportation Needs: Not on file  Physical Activity: Not on file  Stress: Not on file  Social Connections: Not on file     Family History: The patient's family history includes Bladder Cancer (age of onset: 71) in his mother; Healthy in his daughter and son; Heart attack (age of onset: 55) in his father; Pancreatic cancer (age of onset: 71) in his sister. There is no history of Colon cancer.  ROS:   Please see the history of present illness.    All other systems reviewed and are negative.  EKGs/Labs/Other Studies Reviewed:    The following studies were reviewed today: Cardiac Cath 12/10/2016: Conclusion    Non-dominant vessel, Mid RCA lesion, 100 %stenosed. This has progressed since 2012. There are right to right and left to right collaterals.  Patent stent in the first Diag.  Patent proximal LAD stent.  Ost 2nd Diag to mid 2nd Diag lesion, 75 %stenosed. Stable disease since 2012.  1st Diag lesion, 80 %stenosed. This lesion was past the prior stent. After PTCA with a 2.0 balloon, a stent could not be delivered due to the prior LAD and diagional stents.  Post intervention with PTCA, there is a 10% residual stenosis.  LV end diastolic pressure is normal.  There is no aortic valve stenosis.  50% mid LAD lesion which was assessed by FFR. Resting FFR was 0.89 and after adenosine, FFR dropped to 0.82.   Continue dual antiplatelet therapy for at least a month. No stent was actually placed. The stent could not be delivered due to the prior LAD  and diagonal stents. There is an excellent angioplasty result.  FFR of the mid LAD was negative. Continue aggressive medical therapy. Can consider continuing clopidogrel long-term.  Diagnostic Dominance: Left  Left Anterior Descending  Previously placed Prox LAD to Mid LAD drug eluting stent is widely patent.  First Diagonal Branch  Vessel is small in size.  Previously placed Ost 1st Diag to 1st Diag drug eluting stent is widely patent.    Second Diagonal Branch  Vessel is small in size.      Left Circumflex  The vessel exhibits minimal luminal irregularities.  Right Coronary Artery  Vessel is small.  Collaterals  Dist RCA filled by collaterals from Acute Mrg.    The lesion is chronically occluded with left-to-right and right-to-right collateral flow.  Right Posterior Descending Artery  Collaterals  RPDA filled by collaterals from Dist LAD.    Diagnostic Dominance: Left    Intervention      EKG:  EKG is not ordered today.   Recent Labs: 11/29/2020: ALT 22; BUN 18; Creatinine, Ser 0.99; Hemoglobin 15.2; Platelets 196.0; Potassium 4.3; Sodium 139; TSH 2.60  Recent Lipid Panel    Component Value Date/Time   CHOL 149 11/29/2020 1349   TRIG 166.0 (H) 11/29/2020 1349   TRIG 128 11/19/2006 1032   HDL 39.40 11/29/2020 1349   CHOLHDL 4 11/29/2020 1349   VLDL 33.2 11/29/2020 1349   LDLCALC 76 11/29/2020 1349   LDLCALC 79 05/29/2020 1443   LDLDIRECT 82.0 05/19/2018 1058     Risk Assessment/Calculations:       Physical Exam:    VS:  BP 136/62   Pulse (!) 57   Ht _0  (1.778 m)   Wt 152 lb 6.4 oz (69.1 kg)   SpO2 97%   BMI 21.87 kg/m     Wt Readings from Last 3 Encounters:  02/09/21 152 lb 6.4 oz (69.1 kg)  11/29/20  159 lb 5.8 oz (72.3 kg)  11/16/20 156 lb (70.8 kg)     GEN:  Well nourished, well developed elderly male in no acute distress HEENT: Normal NECK: No JVD; No carotid bruits LYMPHATICS: No lymphadenopathy CARDIAC: RRR, no murmurs,  rubs, gallops RESPIRATORY:  Clear to auscultation without rales, wheezing or rhonchi  ABDOMEN: Soft, non-tender, non-distended MUSCULOSKELETAL:  No edema; No deformity  SKIN: Warm and dry NEUROLOGIC:  Alert and oriented x 3 PSYCHIATRIC:  Normal affect   ASSESSMENT:    1. Coronary artery disease involving native coronary artery of native heart without angina pectoris   2. Essential (primary) hypertension   3. Mixed hyperlipidemia   4. PVC's (premature ventricular contractions)    PLAN:    In order of problems listed above:  1. The patient is doing well with no anginal symptoms at present.  He is on a beta-blocker, aspirin, and a statin drug.  I did not make any changes today.  I reviewed his most recent cardiac catheterization data from 2018.  No changes are recommended in his medical regimen at this time. 2. Blood pressure is well controlled on a combination of amlodipine, carvedilol, and losartan.  He has slight dizziness when he first stands up, but otherwise seems to be tolerating his medicines very well.  He will continue on the same regimen. 3. Treated with atorvastatin at low-dose.  Most recent lipids with a cholesterol 149, HDL 39, LDL 76. 4. No PVCs present on his exam today.  He does not currently experience any palpitations.  He remains on low-dose carvedilol seems to be doing well.        Medication Adjustments/Labs and Tests Ordered: Current medicines are reviewed at length with the patient today.  Concerns regarding medicines are outlined above.  No orders of the defined types were placed in this encounter.  No orders of the defined types were placed in this encounter.   Patient Instructions  Medication Instructions:  Your provider recommends that you continue on your current medications as directed. Please refer to the Current Medication list given to you today.   *If you need a refill on your cardiac medications before your next appointment, please call your  pharmacy*   Follow-Up: At [facility], you and your health needs are our priority.  As part of our continuing mission to provide you with exceptional heart care, we have created designated Provider Care Teams.  These Care Teams include your primary Cardiologist (physician) and Advanced Practice Providers (APPs -  Physician Assistants and Nurse Practitioners) who all work together to provide you with the care you need, when you need it. Your next appointment:   12 month(s) The format for your next appointment:   In Person Provider:   You may see Sherren Mocha, MD or one of the following Advanced Practice Providers on your designated Care Team:    Richardson Dopp, PA-C  Robbie Lis, [place]      Signed, Sherren Mocha, MD  02/09/2021 11:31 AM    [place]

## 2021-02-13 ENCOUNTER — Other Ambulatory Visit: Payer: Self-pay | Admitting: Internal Medicine

## 2021-03-08 ENCOUNTER — Ambulatory Visit (INDEPENDENT_AMBULATORY_CARE_PROVIDER_SITE_OTHER): Payer: Medicare HMO

## 2021-03-08 ENCOUNTER — Encounter: Payer: Self-pay | Admitting: Internal Medicine

## 2021-03-08 ENCOUNTER — Other Ambulatory Visit: Payer: Self-pay

## 2021-03-08 ENCOUNTER — Ambulatory Visit (INDEPENDENT_AMBULATORY_CARE_PROVIDER_SITE_OTHER): Payer: Medicare HMO | Admitting: Internal Medicine

## 2021-03-08 VITALS — BP 122/60 | HR 61 | Temp 98.0°F | Ht 70.0 in | Wt 150.6 lb

## 2021-03-08 DIAGNOSIS — E1159 Type 2 diabetes mellitus with other circulatory complications: Secondary | ICD-10-CM

## 2021-03-08 DIAGNOSIS — E559 Vitamin D deficiency, unspecified: Secondary | ICD-10-CM | POA: Diagnosis not present

## 2021-03-08 DIAGNOSIS — R1012 Left upper quadrant pain: Secondary | ICD-10-CM | POA: Diagnosis not present

## 2021-03-08 DIAGNOSIS — Z0000 Encounter for general adult medical examination without abnormal findings: Secondary | ICD-10-CM | POA: Diagnosis not present

## 2021-03-08 DIAGNOSIS — R109 Unspecified abdominal pain: Secondary | ICD-10-CM

## 2021-03-08 DIAGNOSIS — I10 Essential (primary) hypertension: Secondary | ICD-10-CM

## 2021-03-08 LAB — CBC WITH DIFFERENTIAL/PLATELET
Basophils Absolute: 0 10*3/uL (ref 0.0–0.1)
Basophils Relative: 0.6 % (ref 0.0–3.0)
Eosinophils Absolute: 0.2 10*3/uL (ref 0.0–0.7)
Eosinophils Relative: 2.6 % (ref 0.0–5.0)
HCT: 45.9 % (ref 39.0–52.0)
Hemoglobin: 15.5 g/dL (ref 13.0–17.0)
Lymphocytes Relative: 23.7 % (ref 12.0–46.0)
Lymphs Abs: 1.5 10*3/uL (ref 0.7–4.0)
MCHC: 33.7 g/dL (ref 30.0–36.0)
MCV: 88.9 fl (ref 78.0–100.0)
Monocytes Absolute: 0.6 10*3/uL (ref 0.1–1.0)
Monocytes Relative: 8.9 % (ref 3.0–12.0)
Neutro Abs: 4.1 10*3/uL (ref 1.4–7.7)
Neutrophils Relative %: 64.2 % (ref 43.0–77.0)
Platelets: 190 10*3/uL (ref 150.0–400.0)
RBC: 5.16 Mil/uL (ref 4.22–5.81)
RDW: 13.8 % (ref 11.5–15.5)
WBC: 6.5 10*3/uL (ref 4.0–10.5)

## 2021-03-08 LAB — BASIC METABOLIC PANEL
BUN: 20 mg/dL (ref 6–23)
CO2: 29 mEq/L (ref 19–32)
Calcium: 9.7 mg/dL (ref 8.4–10.5)
Chloride: 103 mEq/L (ref 96–112)
Creatinine, Ser: 1.06 mg/dL (ref 0.40–1.50)
GFR: 63.34 mL/min (ref >60.00)
Glucose, Bld: 130 mg/dL — ABNORMAL HIGH (ref 70–99)
Potassium: 4.2 mEq/L (ref 3.5–5.1)
Sodium: 139 mEq/L (ref 135–145)

## 2021-03-08 LAB — URINALYSIS, ROUTINE W REFLEX MICROSCOPIC
Bilirubin Urine: NEGATIVE
Hgb urine dipstick: NEGATIVE
Ketones, ur: NEGATIVE
Leukocytes,Ua: NEGATIVE
Nitrite: NEGATIVE
RBC / HPF: NONE SEEN (ref 0–?)
Specific Gravity, Urine: 1.015 (ref 1.000–1.030)
Total Protein, Urine: NEGATIVE
Urine Glucose: >=1000 — AB
Urobilinogen, UA: 0.2 (ref 0.0–1.0)
WBC, UA: NONE SEEN (ref 0–?)
pH: 5 (ref 5.0–8.0)

## 2021-03-08 LAB — HEPATIC FUNCTION PANEL
ALT: 21 U/L (ref 0–53)
AST: 18 U/L (ref 0–37)
Albumin: 4 g/dL (ref 3.5–5.2)
Alkaline Phosphatase: 43 U/L (ref 39–117)
Bilirubin, Direct: 0.1 mg/dL (ref 0.0–0.3)
Total Bilirubin: 0.5 mg/dL (ref 0.2–1.2)
Total Protein: 6.6 g/dL (ref 6.0–8.3)

## 2021-03-08 LAB — HEMOGLOBIN A1C: Hgb A1c MFr Bld: 7.4 % — ABNORMAL HIGH (ref 4.6–6.5)

## 2021-03-08 LAB — VITAMIN D 25 HYDROXY (VIT D DEFICIENCY, FRACTURES): VITD: 30.98 ng/mL (ref 30.00–100.00)

## 2021-03-08 LAB — LIPASE: Lipase: 32 U/L (ref 11.0–59.0)

## 2021-03-08 NOTE — Progress Notes (Signed)
Subjective:   Keith Herrera is a 87 y.o. male who presents for Medicare Annual/Subsequent preventive examination.  Review of Systems    No ROS. Medicare Wellness Visit. Additional risk factors are reflected in social history. Cardiac Risk Factors include: advanced age (>82mn, >>80women);dyslipidemia;hypertension;family history of premature cardiovascular disease;diabetes mellitus;male gender Sleep Patterns: Yes,sleep issues,gets up 1-3 times to void Home Safety/Smoke Alarms: Feels safe in home; uses home alarm. Smoke alarms in place. Living environment: 3-story home; Lives with spouse; no needs for DME; good support system. Seat Belt Safety/Bike Helmet: Wears seat belt.    Objective:    Today's Vitals   03/08/21 1318 03/08/21 1345  BP: 122/60   Pulse: 61   Temp: 98 F (36.7 C)   SpO2: 98%   Weight: 150 lb 9.6 oz (68.3 kg)   Height: '5\' 10"'  (1.778 m)   PainSc:  6    Body mass index is 21.61 kg/m.  Advanced Directives 03/08/2021 12/08/2016 12/08/2016 12/27/2015 03/20/2015 03/05/2015 01/30/2015  Does Patient Have a Medical Advance Directive? Yes No No Yes No No No  Type of AParamedicof AGoliadLiving will - - - - - -  Does patient want to make changes to medical advance directive? No - Patient declined - - - - - -  Copy of [place] Chart? No - copy requested - - - - - -  Would patient like information on creating a medical advance directive? - No - Patient declined No - Patient declined - No - patient declined information No - patient declined information No - patient declined information    Current Medications (verified) Outpatient Encounter Medications as of 03/08/2021  Medication Sig  . ACCU-CHEK AVIVA PLUS test strip USE FOR TESTING UP TO 4 TIMES DAILY AS DIRECTED  . amLODipine (NORVASC) 5 MG tablet TAKE 1 TABLET BY MOUTH EVERY DAY  . aspirin EC 81 MG tablet Take 81 mg by mouth daily after supper.  .Marland Kitchenatorvastatin (LIPITOR) 10  MG tablet TAKE 1 TABLET (10 MG TOTAL) BY MOUTH DAILY AT 6 PM.  . blood glucose meter kit and supplies Dispense based on patient and insurance preference. Use up to four times daily as directed. (FOR ICD-10 E10.9, E11.9).  .Marland KitchenBlood Glucose Monitoring Suppl (ONE TOUCH ULTRA 2) w/Device KIT Use as directed E 11.9  . brimonidine (ALPHAGAN) 0.2 % ophthalmic solution ADMINISTER 1 DROP TO BOTH EYES THREE (3) TIMES A DAY.  . carvedilol (COREG) 3.125 MG tablet TAKE 1 TABLET (3.125 MG TOTAL) BY MOUTH 2 (TWO) TIMES DAILY WITH A MEAL.  .Marland Kitchenempagliflozin (JARDIANCE) 25 MG TABS tablet Take 1 tablet (25 mg total) by mouth daily before breakfast.  . finasteride (PROSCAR) 5 MG tablet Take 5 mg by mouth daily.   .Marland KitchenglipiZIDE (GLUCOTROL XL) 5 MG 24 hr tablet TAKE 1 TABLET EVERY DAY WITH BREAKFAST ANNUAL APPT DUE IN JULY MUST SEE PROVIDER FOR FUTURE REFILLS  . Lancets MISC Use as directed twice daily E11.9  . latanoprost (XALATAN) 0.005 % ophthalmic solution Place 1 drop into both eyes at bedtime.  .Marland Kitchenlosartan (COZAAR) 100 MG tablet TAKE 1 TABLET EVERY DAY  . metFORMIN (GLUCOPHAGE) 500 MG tablet Take 500 mg by mouth 2 (two) times daily with a meal.  . nitroGLYCERIN (NITROSTAT) 0.4 MG SL tablet Place 1 tablet (0.4 mg total) under the tongue every 5 (five) minutes as needed for chest pain.  . pantoprazole (PROTONIX) 40 MG tablet TAKE 1 TABLET  BY MOUTH EVERY DAY  . triamcinolone (NASACORT) 55 MCG/ACT AERO nasal inhaler Place 2 sprays into the nose daily.  Marland Kitchen triamcinolone ointment (KENALOG) 0.1 %    No facility-administered encounter medications on file as of 03/08/2021.    Allergies (verified) Crestor [rosuvastatin calcium] and Lovastatin   History: Past Medical History:  Diagnosis Date  . Adenomatous polyp of colon 05/2004  . Allergic rhinitis   . Anxiety   . BPH (benign prostatic hyperplasia)   . CAD (coronary artery disease)    s/p Cypher DES to LAD and pD1 2010;  LHC was done 6/12: EF 65%, circumflex patent,  mid RCA 80-90% (small and nondominant), LAD and diagonal stents patent, LAD at the origin of the first diagonal 30%, ostial D2 90%, mid 80-90% (small vessel).  Continued medical therapy was recommended.   . Carotid stenosis    dopplers 02/2012: 0-78% RICA; 67-54% LICA  . Diabetes type 2, controlled ([place])   . Diverticulosis    colon  . ED (erectile dysfunction)   . Esophageal stricture   . GERD (gastroesophageal reflux disease)   . Hemorrhoids   . Hiatal hernia   . Hyperlipidemia   . Hypertension   . IBS (irritable bowel syndrome)   . PVC's (premature ventricular contractions) 01/21/2020   Monitor 07/2019: PVC burden 21.6% // Echocardiogram 01/2020: EF 65-70, no RWMA, Gr 2 DD, normal RVSF, RVSP 43.3 mmHg (mod pul HTN), mild LAE, mild MR, trivial AI  . Right inguinal hernia    s/p repair in 8/12  . SVT (supraventricular tachycardia) (HCC)    Past Surgical History:  Procedure Laterality Date  . CARDIAC CATHETERIZATION N/A 12/10/2016   Procedure: Left Heart Cath and Coronary Angiography;  Surgeon: Jettie Booze, MD;  Location: [facility];  Service: Cardiovascular;  Laterality: N/A;  . CARDIAC CATHETERIZATION N/A 12/10/2016   Procedure: Coronary Balloon Angioplasty;  Surgeon: Jettie Booze, MD;  Location: [facility];  Service: Cardiovascular;  Laterality: N/A;  . CARDIAC CATHETERIZATION N/A 12/10/2016   Procedure: Intravascular Pressure Wire/FFR Study;  Surgeon: Jettie Booze, MD;  Location: [facility];  Service: Cardiovascular;  Laterality: N/A;  . CORONARY STENT PLACEMENT  10/2008   x 2  . ELECTROPHYSIOLOGIC STUDY N/A 03/20/2015   no inducible SVT - Dr Lovena Le  . INGUINAL HERNIA REPAIR Right    RIH with ultrapro patch   Family History  Problem Relation Age of Onset  . Bladder Cancer Mother 20  . Heart attack Father 29       Deceased  . Pancreatic cancer Sister 48  . Healthy Son   . Healthy Daughter   . Colon cancer Neg Hx    Social History    Socioeconomic History  . Marital status: Married    Spouse name: Not on file  . Number of children: 3  . Years of education: Not on file  . Highest education level: Not on file  Occupational History  . 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 Smoking status: Former Smoker    Types: Cigarettes    Quit date: 03/05/1956    Years since quitting: 65.0  . Smokeless tobacco: Never Used  . Tobacco comment: smoked in teens  Vaping Use  . Vaping Use: Never used  Substance and Sexual Activity  . Alcohol use: No    Alcohol/week: 0.0 standard drinks  . Drug use: No  . Sexual activity: Not on file  Other Topics Concern  . Not on file  Social History Narrative   Lives with wife in a 3 story home.  Has 2 living children.  1 passed away in a car accident.     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     Social Determinants of Health   Financial Resource Strain: Not on file  Food Insecurity: Not on file  Transportation Needs: No Transportation Needs  . Lack of Transportation (Medical): No  . Lack of Transportation (Non-Medical): No  Physical Activity: Sufficiently Active  . Days of Exercise per Week: 5 days  . Minutes of Exercise per Session: 30 min  Stress: Not on file  Social Connections: Socially Integrated  . Frequency of Communication with Friends and Family: More than three times a week  . Frequency of Social Gatherings with Friends and Family: More than three times a week  . Attends Religious Services: More than 4 times per year  . Active Member of Clubs or Organizations: Yes  . Attends Archivist Meetings: More than 4 times per year  . Marital Status: Married    Tobacco Counseling Counseling given: Not Answered Comment: smoked in teens   Clinical Intake:  Pre-visit preparation completed: Yes  Pain : 0-10 Pain Score: 6  Pain Type: Acute pain Pain Location:  Back Pain Orientation: Left,Lower Pain Radiating Towards: left hip Pain Descriptors / Indicators: Aching,Dull,Sharp Pain Onset: 1 to 4 weeks ago Pain Frequency: Intermittent Pain Relieving Factors: Tylenol Effect of Pain on Daily Activities: Pain can diminish job performance, lower motivation to exercise, and prevent you from completing daily tasks. Pain produces disability and affects the quality of life.  Pain Relieving Factors: Tylenol  BMI - recorded: 21.61 Nutritional Status: BMI of 19-24  Normal Nutritional Risks: None Diabetes: Yes CBG done?: No CBG resulted in Enter/ Edit results?: No Did pt. bring in CBG monitor from home?: No  How often do you need to have someone help you when you read instructions, pamphlets, or other written materials from your doctor or pharmacy?: 1 - Never What is the last grade level you completed in school?: [employer]  Diabetic? yes  Interpreter Needed?: No  Information entered by :: Lisette Abu, LPN   Activities of Daily Living In your present state of health, do you have any difficulty performing the following activities: 03/08/2021 03/08/2021  Hearing? N Y  Comment wears hearing aids -  Vision? N N  Difficulty concentrating or making decisions? N N  Walking or climbing stairs? N N  Dressing or bathing? N N  Doing errands, shopping? N N  Preparing Food and eating ? N -  Using the Toilet? N -  In the past six months, have you accidently leaked urine? N -  Do you have problems with loss of bowel control? N -  Managing your Medications? N -  Managing your Finances? N -  Housekeeping or managing your Housekeeping? N -  Some recent data might be hidden    Patient Care Team: [name], MD as PCP - Cyndia Diver, MD as PCP - Cardiology (Cardiology) Evans Lance, MD as PCP - Electrophysiology (Cardiology)  Indicate any recent Medical Services you may have received from other than [facility] providers in the  past year (date may be approximate).     Assessment:   This is a routine wellness examination for Keith Herrera.  Hearing/Vision screen No exam data  present  Dietary issues and exercise activities discussed: Current Exercise Habits: Home exercise routine, Type of exercise: walking;Other - see comments (yard work, working on farm), Time (Minutes): 30, Frequency (Times/Week): 5, Weekly Exercise (Minutes/Week): 150, Intensity: Mild  Goals    .  patient (pt-stated)      Maintain health and stay active; continue to work in yard       [place] 2/9 Scores 03/08/2021 03/08/2021 11/29/2020 11/30/2019 05/26/2019 11/25/2018 04/16/2018  PHQ - 2 Score 0 0 0 0 0 0 0  PHQ- 9 Score - - - - - - 0    Fall Risk Fall Risk  03/08/2021 11/29/2020 05/29/2020 11/30/2019 05/26/2019  Falls in the past year? 1 0 0 0 0  Number falls in past yr: 0 - 0 - -  Injury with Fall? 1 - 0 - -  Risk for fall due to : - - No Fall Risks - -  Follow up - - Falls evaluation completed - -    FALL RISK PREVENTION PERTAINING TO THE HOME:  Any stairs in or around the home? Yes  If so, are there any without handrails? No  Home free of loose throw rugs in walkways, pet beds, electrical cords, etc? Yes  Adequate lighting in your home to reduce risk of falls? Yes   ASSISTIVE DEVICES UTILIZED TO PREVENT FALLS:  Life alert? No  Use of a cane, walker or w/c? No  Grab bars in the bathroom? Yes  Shower chair or bench in shower? Yes  Elevated toilet seat or a handicapped toilet? Yes   TIMED UP AND GO:  Was the test performed? No .  Length of time to ambulate 10 feet: 0 sec.   Gait steady and fast without use of assistive device  Cognitive Function: Normal cognitive status assessed by direct observation by this Nurse Health Advisor. No abnormalities found.          Immunizations Immunization History  Administered Date(s) Administered  . Fluad Quad(high Dose 65+) 07/19/2019, 09/21/2020  . H1N1 10/28/2008  . Influenza  Whole 11/30/2007, 11/18/2009  . Influenza, High Dose Seasonal PF 09/21/2013, 10/04/2014, 08/19/2017  . Influenza-Unspecified 06/21/2016, 08/19/2018  . Moderna Sars-Covid-2 Vaccination 12/01/2019, 12/29/2019, 11/02/2020  . Pneumococcal Conjugate-13 10/05/2013  . Pneumococcal Polysaccharide-23 11/30/2007  . Td 03/23/1998, 10/28/2008  . Tdap 11/25/2018    TDAP status: Up to date  Flu Vaccine status: Up to date  Pneumococcal vaccine status: Up to date  Covid-19 vaccine status: Completed vaccines  Qualifies for Shingles Vaccine? Yes   Zostavax completed No   Shingrix Completed?: No.    Education has been provided regarding the importance of this vaccine. Patient has been advised to call insurance company to determine out of pocket expense if they have not yet received this vaccine. Advised may also receive vaccine at local pharmacy or Health Dept. Verbalized acceptance and understanding.  Screening Tests Health Maintenance  Topic Date Due  . FOOT EXAM  05/25/2020  . INFLUENZA VACCINE  06/18/2021  . OPHTHALMOLOGY EXAM  10/24/2021  . TETANUS/TDAP  11/25/2028  . COVID-19 Vaccine  Completed  . PNA vac Low Risk Adult  Completed  . HPV VACCINES  Aged Out    Health Maintenance  Health Maintenance Due  Topic Date Due  . FOOT EXAM  05/25/2020    Colorectal cancer screening: No longer required.   Lung Cancer Screening: (Low Dose CT Chest recommended if Age 47-80 years, 30 pack-year currently smoking OR have quit w/in 15years.) does  not qualify.   Lung Cancer Screening Referral: no  Additional Screening:  Hepatitis C Screening: does not qualify; Completed no  Vision Screening: Recommended annual ophthalmology exams for early detection of glaucoma and other disorders of the eye. Is the patient up to date with their annual eye exam?  Yes  Who is the provider or what is the name of the office in which the patient attends annual eye exams? [facility] If pt is not  established with a provider, would they like to be referred to a provider to establish care? No .   Dental Screening: Recommended annual dental exams for proper oral hygiene  Community Resource Referral / Chronic Care Management: CRR required this visit?  No   CCM required this visit?  No      Plan:     I have personally reviewed and noted the following in the patient's chart:   . Medical and social history . Use of alcohol, tobacco or illicit drugs  . Current medications and supplements . Functional ability and status . Nutritional status . Physical activity . Advanced directives . List of other physicians . Hospitalizations, surgeries, and ER visits in previous 12 months . Vitals . Screenings to include cognitive, depression, and falls . Referrals and appointments  In addition, I have reviewed and discussed with patient certain preventive protocols, quality metrics, and best practice recommendations. A written personalized care plan for preventive services as well as general preventive health recommendations were provided to patient.     Sheral Flow, LPN   05/06/5092   Nurse Notes:  Medications reviewed with patient; no opioid use noted.

## 2021-03-08 NOTE — Assessment & Plan Note (Signed)
Etiology unclear, cant r/o renal stone vs other, for labs as ordered, and CT Abd/pelvis - no CM

## 2021-03-08 NOTE — Patient Instructions (Signed)
[name] , Thank you for taking time to come for your Medicare Wellness Visit. I appreciate your ongoing commitment to your health goals. Please review the following plan we discussed and let me know if I can assist you in the future.   Screening recommendations/referrals: Colonoscopy: not a candidate for colon cancer screening Recommended yearly ophthalmology/optometry visit for glaucoma screening and checkup Recommended yearly dental visit for hygiene and checkup  Vaccinations: Influenza vaccine: 09/21/2020 Pneumococcal vaccine: 11/30/2007, 09/25/2013 (completed) Tdap vaccine: 11/25/2018; due every 10 years Shingles vaccine: Please call your insurance company to determine your out of pocket expense for the Shingrix vaccine. You may receive this vaccine at your local pharmacy. Covid-19: 12/01/2019, 12/29/2019, 11/02/2020  Advanced directives: Please bring a copy of your health care power of attorney and living will to the office at your convenience.  Conditions/risks identified: Yes. Reviewed health maintenance screenings with patient today and relevant education, vaccines, and/or referrals were provided. Continue doing brain stimulating activities (puzzles, reading, adult coloring books, staying active) to keep memory sharp. Continue to eat heart healthy diet (full of fruits, vegetables, whole grains, lean protein, water--limit salt, fat, and sugar intake) and increase physical activity as tolerated.  Next appointment: Please schedule your next Medicare Wellness Visit with your Nurse Health Advisor in 1 year.   Preventive Care 79 Years and Older, Male Preventive care refers to lifestyle choices and visits with your health care provider that can promote health and wellness. What does preventive care include?  A yearly physical exam. This is also called an annual well check.  Dental exams once or twice a year.  Routine eye exams. Ask your health care provider how often you should have your  eyes checked.  Personal lifestyle choices, including:  Daily care of your teeth and gums.  Regular physical activity.  Eating a healthy diet.  Avoiding tobacco and drug use.  Limiting alcohol use.  Practicing safe sex.  Taking low doses of aspirin every day.  Taking vitamin and mineral supplements as recommended by your health care provider. What happens during an annual well check? The services and screenings done by your health care provider during your annual well check will depend on your age, overall health, lifestyle risk factors, and family history of disease. Counseling  Your health care provider may ask you questions about your:  Alcohol use.  Tobacco use.  Drug use.  Emotional well-being.  Home and relationship well-being.  Sexual activity.  Eating habits.  History of falls.  Memory and ability to understand (cognition).  Work and work Statistician. Screening  You may have the following tests or measurements:  Height, weight, and BMI.  Blood pressure.  Lipid and cholesterol levels. These may be checked every 5 years, or more frequently if you are over 85 years old.  Skin check.  Lung cancer screening. You may have this screening every year starting at age 67 if you have a 30-pack-year history of smoking and currently smoke or have quit within the past 15 years.  Fecal occult blood test (FOBT) of the stool. You may have this test every year starting at age 13.  Flexible sigmoidoscopy or colonoscopy. You may have a sigmoidoscopy every 5 years or a colonoscopy every 10 years starting at age 29.  Prostate cancer screening. Recommendations will vary depending on your family history and other risks.  Hepatitis C blood test.  Hepatitis B blood test.  Sexually transmitted disease (STD) testing.  Diabetes screening. This is done by checking your blood  sugar (glucose) after you have not eaten for a while (fasting). You may have this done every 1-3  years.  Abdominal aortic aneurysm (AAA) screening. You may need this if you are a current or former smoker.  Osteoporosis. You may be screened starting at age 45 if you are at high risk. Talk with your health care provider about your test results, treatment options, and if necessary, the need for more tests. Vaccines  Your health care provider may recommend certain vaccines, such as:  Influenza vaccine. This is recommended every year.  Tetanus, diphtheria, and acellular pertussis (Tdap, Td) vaccine. You may need a Td booster every 10 years.  Zoster vaccine. You may need this after age 36.  Pneumococcal 13-valent conjugate (PCV13) vaccine. One dose is recommended after age 46.  Pneumococcal polysaccharide (PPSV23) vaccine. One dose is recommended after age 24. Talk to your health care provider about which screenings and vaccines you need and how often you need them. This information is not intended to replace advice given to you by your health care provider. Make sure you discuss any questions you have with your health care provider. Document Released: 12/01/2015 Document Revised: 07/24/2016 Document Reviewed: 09/05/2015 Elsevier Interactive Patient Education  2017 [place] Prevention in the Home Falls can cause injuries. They can happen to people of all ages. There are many things you can do to make your home safe and to help prevent falls. What can I do on the outside of my home?  Regularly fix the edges of walkways and driveways and fix any cracks.  Remove anything that might make you trip as you walk through a door, such as a raised step or threshold.  Trim any bushes or trees on the path to your home.  Use bright outdoor lighting.  Clear any walking paths of anything that might make someone trip, such as rocks or tools.  Regularly check to see if handrails are loose or broken. Make sure that both sides of any steps have handrails.  Any raised decks and porches  should have guardrails on the edges.  Have any leaves, snow, or ice cleared regularly.  Use sand or salt on walking paths during winter.  Clean up any spills in your garage right away. This includes oil or grease spills. What can I do in the bathroom?  Use night lights.  Install grab bars by the toilet and in the tub and shower. Do not use towel bars as grab bars.  Use non-skid mats or decals in the tub or shower.  If you need to sit down in the shower, use a plastic, non-slip stool.  Keep the floor dry. Clean up any water that spills on the floor as soon as it happens.  Remove soap buildup in the tub or shower regularly.  Attach bath mats securely with double-sided non-slip rug tape.  Do not have throw rugs and other things on the floor that can make you trip. What can I do in the bedroom?  Use night lights.  Make sure that you have a light by your bed that is easy to reach.  Do not use any sheets or blankets that are too big for your bed. They should not hang down onto the floor.  Have a firm chair that has side arms. You can use this for support while you get dressed.  Do not have throw rugs and other things on the floor that can make you trip. What can I do in the  kitchen?  Clean up any spills right away.  Avoid walking on wet floors.  Keep items that you use a lot in easy-to-reach places.  If you need to reach something above you, use a strong step stool that has a grab bar.  Keep electrical cords out of the way.  Do not use floor polish or wax that makes floors slippery. If you must use wax, use non-skid floor wax.  Do not have throw rugs and other things on the floor that can make you trip. What can I do with my stairs?  Do not leave any items on the stairs.  Make sure that there are handrails on both sides of the stairs and use them. Fix handrails that are broken or loose. Make sure that handrails are as long as the stairways.  Check any carpeting to  make sure that it is firmly attached to the stairs. Fix any carpet that is loose or worn.  Avoid having throw rugs at the top or bottom of the stairs. If you do have throw rugs, attach them to the floor with carpet tape.  Make sure that you have a light switch at the top of the stairs and the bottom of the stairs. If you do not have them, ask someone to add them for you. What else can I do to help prevent falls?  Wear shoes that:  Do not have high heels.  Have rubber bottoms.  Are comfortable and fit you well.  Are closed at the toe. Do not wear sandals.  If you use a stepladder:  Make sure that it is fully opened. Do not climb a closed stepladder.  Make sure that both sides of the stepladder are locked into place.  Ask someone to hold it for you, if possible.  Clearly mark and make sure that you can see:  Any grab bars or handrails.  First and last steps.  Where the edge of each step is.  Use tools that help you move around (mobility aids) if they are needed. These include:  Canes.  Walkers.  Scooters.  Crutches.  Turn on the lights when you go into a dark area. Replace any light bulbs as soon as they burn out.  Set up your furniture so you have a clear path. Avoid moving your furniture around.  If any of your floors are uneven, fix them.  If there are any pets around you, be aware of where they are.  Review your medicines with your doctor. Some medicines can make you feel dizzy. This can increase your chance of falling. Ask your doctor what other things that you can do to help prevent falls. This information is not intended to replace advice given to you by your health care provider. Make sure you discuss any questions you have with your health care provider. Document Released: 08/31/2009 Document Revised: 04/11/2016 Document Reviewed: 12/09/2014 Elsevier Interactive Patient Education  2017 [employer].

## 2021-03-08 NOTE — Assessment & Plan Note (Signed)
BP Readings from Last 3 Encounters:  03/08/21 122/60  03/08/21 122/60  02/09/21 136/62   Stable, pt to continue medical treatment norvasc, losartan

## 2021-03-08 NOTE — Patient Instructions (Signed)
Please continue all other medications as before, and refills have been done if requested.  Please have the pharmacy call with any other refills you may need.  Please continue your efforts at being more active, low cholesterol diet, and weight control.  Please keep your appointments with your specialists as you may have planned  You will be contacted regarding the referral for: CT scan for the abdomen/pelvis (no contrast)  Please go to the LAB at the blood drawing area for the tests to be done  You will be contacted by phone if any changes need to be made immediately.  Otherwise, you will receive a letter about your results with an explanation, but please check with MyChart first.  Please remember to sign up for MyChart if you have not done so, as this will be important to you in the future with finding out test results, communicating by private email, and scheduling acute appointments online when needed.

## 2021-03-08 NOTE — Progress Notes (Signed)
Patient ID: Keith Herrera, male   DOB: 01-18-1932, 87 y.o.   MRN: [id number]        Chief Complaint: 2 wks left side pain       HPI:  Keith Herrera is a 87 y.o. male here with c/o 2 wks onset left flank and side LUQ pain, sharp, achy, moderate, constant, worse to move in certain ways but no fever, chills, n/v, and Denies worsening reflux, dysphagia, bowel change or blood.  Also, Denies urinary symptoms such as dysuria, frequency, urgency, flank pain, hematuria or n/v, fever, chills.  Has had recent mild constipation tx with otc laxative, and BM does not help the pain.  Nothing else makes better or wrose.  Pt denies chest pain, increased sob or doe, wheezing, orthopnea, PND, increased LE swelling, palpitations, dizziness or syncope.   Pt denies polydipsia, polyuria         Wt Readings from Last 3 Encounters:  03/08/21 150 lb 9.6 oz (68.3 kg)  03/08/21 150 lb 9.6 oz (68.3 kg)  02/09/21 152 lb 6.4 oz (69.1 kg)   BP Readings from Last 3 Encounters:  03/08/21 122/60  03/08/21 122/60  02/09/21 136/62         Past Medical History:  Diagnosis Date  . Adenomatous polyp of colon 05/2004  . Allergic rhinitis   . Anxiety   . BPH (benign prostatic hyperplasia)   . CAD (coronary artery disease)    s/p Cypher DES to LAD and pD1 2010;  LHC was done 6/12: EF 65%, circumflex patent, mid RCA 80-90% (small and nondominant), LAD and diagonal stents patent, LAD at the origin of the first diagonal 30%, ostial D2 90%, mid 80-90% (small vessel).  Continued medical therapy was recommended.   . Carotid stenosis    dopplers 02/2012: 9-48% RICA; 54-62% LICA  . Diabetes type 2, controlled ([place])   . Diverticulosis    colon  . ED (erectile dysfunction)   . Esophageal stricture   . GERD (gastroesophageal reflux disease)   . Hemorrhoids   . Hiatal hernia   . Hyperlipidemia   . Hypertension   . IBS (irritable bowel syndrome)   . PVC's (premature ventricular contractions) 01/21/2020   Monitor 07/2019: PVC burden  21.6% // Echocardiogram 01/2020: EF 65-70, no RWMA, Gr 2 DD, normal RVSF, RVSP 43.3 mmHg (mod pul HTN), mild LAE, mild MR, trivial AI  . Right inguinal hernia    s/p repair in 8/12  . SVT (supraventricular tachycardia) (HCC)    Past Surgical History:  Procedure Laterality Date  . CARDIAC CATHETERIZATION N/A 12/10/2016   Procedure: Left Heart Cath and Coronary Angiography;  Surgeon: Jettie Booze, MD;  Location: [facility];  Service: Cardiovascular;  Laterality: N/A;  . CARDIAC CATHETERIZATION N/A 12/10/2016   Procedure: Coronary Balloon Angioplasty;  Surgeon: Jettie Booze, MD;  Location: [place] [facility];  Service: Cardiovascular;  Laterality: N/A;  . CARDIAC CATHETERIZATION N/A 12/10/2016   Procedure: Intravascular Pressure Wire/FFR Study;  Surgeon: Jettie Booze, MD;  Location: [facility] [facility];  Service: Cardiovascular;  Laterality: N/A;  . CORONARY STENT PLACEMENT  10/2008   x 2  . ELECTROPHYSIOLOGIC STUDY N/A 03/20/2015   no inducible SVT - Dr Lovena Le  . INGUINAL HERNIA REPAIR Right    RIH with ultrapro patch    reports that he quit smoking about 65 years ago. His smoking use included cigarettes. He has never used smokeless tobacco. He reports that he does not drink alcohol  and does not use drugs. family history includes Bladder Cancer (age of onset: 3) in his mother; Healthy in his daughter and son; Heart attack (age of onset: 43) in his father; Pancreatic cancer (age of onset: 92) in his sister. Allergies  Allergen Reactions  . Crestor [Rosuvastatin Calcium] Rash    All over body  . Lovastatin Itching and Rash   Current Outpatient Medications on File Prior to Visit  Medication Sig Dispense Refill  . ACCU-CHEK AVIVA PLUS test strip USE FOR TESTING UP TO 4 TIMES DAILY AS DIRECTED 100 strip 2  . amLODipine (NORVASC) 5 MG tablet TAKE 1 TABLET BY MOUTH EVERY DAY 90 tablet 3  . aspirin EC 81 MG tablet Take 81 mg by mouth daily after supper.    Marland Kitchen atorvastatin  (LIPITOR) 10 MG tablet TAKE 1 TABLET (10 MG TOTAL) BY MOUTH DAILY AT 6 PM. 90 tablet 1  . blood glucose meter kit and supplies Dispense based on patient and insurance preference. Use up to four times daily as directed. (FOR ICD-10 E10.9, E11.9). 1 each 0  . Blood Glucose Monitoring Suppl (ONE TOUCH ULTRA 2) w/Device KIT Use as directed E 11.9 1 each 0  . brimonidine (ALPHAGAN) 0.2 % ophthalmic solution ADMINISTER 1 DROP TO BOTH EYES THREE (3) TIMES A DAY.  3  . carvedilol (COREG) 3.125 MG tablet TAKE 1 TABLET (3.125 MG TOTAL) BY MOUTH 2 (TWO) TIMES DAILY WITH A MEAL. 180 tablet 3  . empagliflozin (JARDIANCE) 25 MG TABS tablet Take 1 tablet (25 mg total) by mouth daily before breakfast. 90 tablet 3  . finasteride (PROSCAR) 5 MG tablet Take 5 mg by mouth daily.     Marland Kitchen glipiZIDE (GLUCOTROL XL) 5 MG 24 hr tablet TAKE 1 TABLET EVERY DAY WITH BREAKFAST ANNUAL APPT DUE IN JULY MUST SEE PROVIDER FOR FUTURE REFILLS 90 tablet 1  . Lancets MISC Use as directed twice daily E11.9 200 each 3  . latanoprost (XALATAN) 0.005 % ophthalmic solution Place 1 drop into both eyes at bedtime.  12  . losartan (COZAAR) 100 MG tablet TAKE 1 TABLET EVERY DAY 90 tablet 1  . metFORMIN (GLUCOPHAGE) 500 MG tablet Take 500 mg by mouth 2 (two) times daily with a meal.    . nitroGLYCERIN (NITROSTAT) 0.4 MG SL tablet Place 1 tablet (0.4 mg total) under the tongue every 5 (five) minutes as needed for chest pain. 25 tablet 3  . pantoprazole (PROTONIX) 40 MG tablet TAKE 1 TABLET BY MOUTH EVERY DAY 90 tablet 3  . triamcinolone (NASACORT) 55 MCG/ACT AERO nasal inhaler Place 2 sprays into the nose daily. 1 Inhaler 12  . triamcinolone ointment (KENALOG) 0.1 %      No current facility-administered medications on file prior to visit.        ROS:  All others reviewed and negative.  Objective        PE:  BP 122/60 (BP Location: Left Arm, Patient Position: Sitting, Cuff Size: Normal)   Pulse 61   Temp 98 F (36.7 C) (Oral)   Ht '5\' 10"'   (1.778 m)   Wt 150 lb 9.6 oz (68.3 kg)   SpO2 98%   BMI 21.61 kg/m                 Constitutional: Pt appears in NAD               HENT: Head: NCAT.  Right Ear: External ear normal.                 Left Ear: External ear normal.                Eyes: . Pupils are equal, round, and reactive to light. Conjunctivae and EOM are normal               Nose: without d/c or deformity               Neck: Neck supple. Gross normal ROM               Cardiovascular: Normal rate and regular rhythm.                 Pulmonary/Chest: Effort normal and breath sounds without rales or wheezing.                Abd:  Soft, NT, ND, + BS, no organomegaly               Neurological: Pt is alert. At baseline orientation, motor grossly intact               Skin: Skin is warm. No rashes, no other new lesions, LE edema - none               Psychiatric: Pt behavior is normal without agitation   Micro: none  Cardiac tracings I have personally interpreted today:  none  Pertinent Radiological findings (summarize): none   Lab Results  Component Value Date   WBC 6.5 03/08/2021   HGB 15.5 03/08/2021   HCT 45.9 03/08/2021   PLT 190.0 03/08/2021   GLUCOSE 130 (H) 03/08/2021   CHOL 149 11/29/2020   TRIG 166.0 (H) 11/29/2020   HDL 39.40 11/29/2020   LDLDIRECT 82.0 05/19/2018   LDLCALC 76 11/29/2020   ALT 21 03/08/2021   AST 18 03/08/2021   NA 139 03/08/2021   K 4.2 03/08/2021   CL 103 03/08/2021   CREATININE 1.06 03/08/2021   BUN 20 03/08/2021   CO2 29 03/08/2021   TSH 2.60 11/29/2020   PSA 2.49 12/21/2012   INR 1.15 12/10/2016   HGBA1C 7.4 (H) 03/08/2021   MICROALBUR 3.5 (H) 11/30/2019   Assessment/Plan:  Keith Herrera is a 87 y.o. White or Caucasian [1] male with  has a past medical history of Adenomatous polyp of colon (05/2004), Allergic rhinitis, Anxiety, BPH (benign prostatic hyperplasia), CAD (coronary artery disease), Carotid stenosis, Diabetes type 2, controlled ([place]),  Diverticulosis, ED (erectile dysfunction), Esophageal stricture, GERD (gastroesophageal reflux disease), Hemorrhoids, Hiatal hernia, Hyperlipidemia, Hypertension, IBS (irritable bowel syndrome), PVC's (premature ventricular contractions) (01/21/2020), Right inguinal hernia, and SVT (supraventricular tachycardia) ([place]).  Left flank pain Etiology unclear, cant r/o renal stone vs other, for labs as ordered, and CT Abd/pelvis - no CM  Type 2 diabetes mellitus with vascular disease ([place]) Lab Results  Component Value Date   HGBA1C 7.4 (H) 03/08/2021   Stable, pt to continue current medical treatment jardiance, glucotrol, metformin   Essential hypertension BP Readings from Last 3 Encounters:  03/08/21 122/60  03/08/21 122/60  02/09/21 136/62   Stable, pt to continue medical treatment norvasc, losartan   Followup: Return if symptoms worsen or fail to improve.  Cathlean Cower, MD 03/08/2021 10:29 PM [facility]

## 2021-03-08 NOTE — Assessment & Plan Note (Signed)
Lab Results  Component Value Date   HGBA1C 7.4 (H) 03/08/2021   Stable, pt to continue current medical treatment jardiance, glucotrol, metformin

## 2021-03-09 ENCOUNTER — Encounter: Payer: Self-pay | Admitting: Internal Medicine

## 2021-03-09 ENCOUNTER — Ambulatory Visit
Admission: RE | Admit: 2021-03-09 | Discharge: 2021-03-09 | Disposition: A | Discharge status: Home / Self Care | Payer: Medicare HMO | Source: Ambulatory Visit | Attending: Internal Medicine | Admitting: Internal Medicine

## 2021-03-09 DIAGNOSIS — K8020 Calculus of gallbladder without cholecystitis without obstruction: Secondary | ICD-10-CM | POA: Insufficient documentation

## 2021-03-09 DIAGNOSIS — R109 Unspecified abdominal pain: Secondary | ICD-10-CM | POA: Diagnosis not present

## 2021-03-09 DIAGNOSIS — I700 Atherosclerosis of aorta: Secondary | ICD-10-CM | POA: Insufficient documentation

## 2021-03-09 DIAGNOSIS — K8080 Other cholelithiasis without obstruction: Secondary | ICD-10-CM | POA: Diagnosis not present

## 2021-03-15 ENCOUNTER — Other Ambulatory Visit: Payer: Self-pay | Admitting: Internal Medicine

## 2021-03-20 ENCOUNTER — Other Ambulatory Visit: Payer: Medicare HMO

## 2021-04-02 DIAGNOSIS — H401133 Primary open-angle glaucoma, bilateral, severe stage: Secondary | ICD-10-CM | POA: Diagnosis not present

## 2021-04-02 DIAGNOSIS — H538 Other visual disturbances: Secondary | ICD-10-CM | POA: Diagnosis not present

## 2021-04-02 DIAGNOSIS — Z961 Presence of intraocular lens: Secondary | ICD-10-CM | POA: Diagnosis not present

## 2021-04-19 DIAGNOSIS — Z23 Encounter for immunization: Secondary | ICD-10-CM | POA: Diagnosis not present

## 2021-04-20 ENCOUNTER — Other Ambulatory Visit: Payer: Self-pay | Admitting: Internal Medicine

## 2021-04-22 ENCOUNTER — Other Ambulatory Visit: Payer: Self-pay | Admitting: Internal Medicine

## 2021-04-22 NOTE — Telephone Encounter (Signed)
Please refill as per office routine med refill policy (all routine meds refilled for 3 mo or monthly per pt preference up to one year from last visit, then month to month grace period for 3 mo, then further med refills will have to be denied)  

## 2021-05-01 ENCOUNTER — Other Ambulatory Visit: Payer: Self-pay | Admitting: Internal Medicine

## 2021-05-04 DIAGNOSIS — H21233 Degeneration of iris (pigmentary), bilateral: Secondary | ICD-10-CM | POA: Insufficient documentation

## 2021-05-04 DIAGNOSIS — H5319 Other subjective visual disturbances: Secondary | ICD-10-CM | POA: Diagnosis not present

## 2021-05-04 DIAGNOSIS — Z961 Presence of intraocular lens: Secondary | ICD-10-CM | POA: Diagnosis not present

## 2021-05-24 ENCOUNTER — Other Ambulatory Visit: Payer: Self-pay | Admitting: Internal Medicine

## 2021-05-29 ENCOUNTER — Other Ambulatory Visit: Payer: Self-pay

## 2021-05-29 ENCOUNTER — Encounter: Payer: Self-pay | Admitting: Internal Medicine

## 2021-05-29 ENCOUNTER — Ambulatory Visit (INDEPENDENT_AMBULATORY_CARE_PROVIDER_SITE_OTHER): Payer: Medicare HMO | Admitting: Internal Medicine

## 2021-05-29 VITALS — BP 122/68 | HR 62 | Temp 98.3°F | Ht 70.0 in | Wt 153.6 lb

## 2021-05-29 DIAGNOSIS — E7800 Pure hypercholesterolemia, unspecified: Secondary | ICD-10-CM

## 2021-05-29 DIAGNOSIS — I700 Atherosclerosis of aorta: Secondary | ICD-10-CM | POA: Diagnosis not present

## 2021-05-29 DIAGNOSIS — G2581 Restless legs syndrome: Secondary | ICD-10-CM | POA: Diagnosis not present

## 2021-05-29 DIAGNOSIS — E559 Vitamin D deficiency, unspecified: Secondary | ICD-10-CM | POA: Diagnosis not present

## 2021-05-29 DIAGNOSIS — R42 Dizziness and giddiness: Secondary | ICD-10-CM

## 2021-05-29 DIAGNOSIS — E1159 Type 2 diabetes mellitus with other circulatory complications: Secondary | ICD-10-CM | POA: Diagnosis not present

## 2021-05-29 DIAGNOSIS — I10 Essential (primary) hypertension: Secondary | ICD-10-CM | POA: Diagnosis not present

## 2021-05-29 MED ORDER — FINASTERIDE 5 MG PO TABS: 5.0000 mg | ORAL_TABLET | Freq: Every day | ORAL | 3 refills | Status: DC

## 2021-05-29 MED ORDER — MECLIZINE HCL 12.5 MG PO TABS: 12.5000 mg | ORAL_TABLET | Freq: Three times a day (TID) | ORAL | 2 refills | Status: DC | PRN

## 2021-05-29 MED ORDER — GABAPENTIN 300 MG PO CAPS: ORAL_CAPSULE | ORAL | 1 refills | Status: DC

## 2021-05-29 MED ORDER — SIMVASTATIN 40 MG PO TABS: 40.0000 mg | ORAL_TABLET | Freq: Every day | ORAL | 3 refills | Status: DC

## 2021-05-29 NOTE — Patient Instructions (Addendum)
Ok to change the lipitor to simvastatin 40 mg per day   Please take OTC Vitamin D3 at 2000 units per day, indefinitely  Please take all new medication as prescribed - the gabapentin 300 mg - 1 -2 tab at bedtime at night to help with sleeping and restless legs  Ok to restart the meclizine as needed for the dizziness  ALL of these medications were sent locally to [facility]  Please continue all other medications as before, and refills have been done if requested - the finasteride  Please have the pharmacy call with any other refills you may need.  Please continue your efforts at being more active, low cholesterol diet, and weight control..  Please keep your appointments with your specialists as you may have planned  Please make an Appointment to return in 3 months, or sooner if needed

## 2021-05-29 NOTE — Progress Notes (Signed)
Patient ID: Keith Herrera, male   DOB: 22-Jun-1932, 87 y.o.   MRN: [id number]        Chief Complaint: follow up HTN, HLD and hyperglycemia , RLS and vertigo, low vit d       HPI:  Keith Herrera is a 87 y.o. male here with c/o mld intermittent dizziness,not really better with improved HR with decreased BB per cardiology, and seems vertiginous with head position movement   Has long hx of vertigo and the kind of dizziness now seems to be with head movement.  Has been imroved in past with tid meclizine prn.  Pt states jardiance now $140 per 3 mo and similar even for lipitor 10 mg but willing to keep taking the jardiance.  Trazodone no longer working for sleep - up every 1-2 hours at night, just cant stay asleep, but is not really always due to needing to urinate.  Not taking Vit D in the past month.  Good compliance with lipitor.  Pt denies chest pain, increased sob or doe, wheezing, orthopnea, PND, increased LE swelling, palpitations, or syncope.   Pt denies polydipsia, polyuria, or new focal neuro s/s.    Has some increased myalgias recently, and asks for lipitor change as he seemed to have similar issue with crestor in the past BP Readings from Last 3 Encounters:  05/29/21 122/68  03/08/21 122/60  03/08/21 122/60         Past Medical History:  Diagnosis Date   Adenomatous polyp of colon 05/2004   Allergic rhinitis    Anxiety    BPH (benign prostatic hyperplasia)    CAD (coronary artery disease)    s/p Cypher DES to LAD and pD1 2010;  LHC was done 6/12: EF 65%, circumflex patent, mid RCA 80-90% (small and nondominant), LAD and diagonal stents patent, LAD at the origin of the first diagonal 30%, ostial D2 90%, mid 80-90% (small vessel).  Continued medical therapy was recommended.    Carotid stenosis    dopplers 02/2012: 8-25% RICA; 05-39% LICA   Diabetes type 2, controlled (HCC)    Diverticulosis    colon   ED (erectile dysfunction)    Esophageal stricture    GERD (gastroesophageal reflux  disease)    Hemorrhoids    Hiatal hernia    Hyperlipidemia    Hypertension    IBS (irritable bowel syndrome)    PVC's (premature ventricular contractions) 01/21/2020   Monitor 07/2019: PVC burden 21.6% // Echocardiogram 01/2020: EF 65-70, no RWMA, Gr 2 DD, normal RVSF, RVSP 43.3 mmHg (mod pul HTN), mild LAE, mild MR, trivial AI   Right inguinal hernia    s/p repair in 8/12   SVT (supraventricular tachycardia) ([place])    Past Surgical History:  Procedure Laterality Date   CARDIAC CATHETERIZATION N/A 12/10/2016   Procedure: Left Heart Cath and Coronary Angiography;  Surgeon: Jettie Booze, MD;  Location: [facility];  Service: Cardiovascular;  Laterality: N/A;   CARDIAC CATHETERIZATION N/A 12/10/2016   Procedure: Coronary Balloon Angioplasty;  Surgeon: Jettie Booze, MD;  Location: [facility];  Service: Cardiovascular;  Laterality: N/A;   CARDIAC CATHETERIZATION N/A 12/10/2016   Procedure: Intravascular Pressure Wire/FFR Study;  Surgeon: Jettie Booze, MD;  Location: [facility];  Service: Cardiovascular;  Laterality: N/A;   CORONARY STENT PLACEMENT  10/2008   x 2   ELECTROPHYSIOLOGIC STUDY N/A 03/20/2015   no inducible SVT - Dr Irish Elders HERNIA REPAIR Right  RIH with ultrapro patch    reports that he quit smoking about 65 years ago. His smoking use included cigarettes. He has never used smokeless tobacco. He reports that he does not drink alcohol and does not use drugs. family history includes Bladder Cancer (age of onset: 46) in his mother; Healthy in his daughter and son; Heart attack (age of onset: 1) in his father; Pancreatic cancer (age of onset: 75) in his sister. Allergies  Allergen Reactions   Crestor [Rosuvastatin Calcium] Rash    All over body   Lovastatin Itching and Rash   Current Outpatient Medications on File Prior to Visit  Medication Sig Dispense Refill   ACCU-CHEK AVIVA PLUS test strip USE FOR TESTING UP TO 4 TIMES DAILY AS  DIRECTED 100 strip 2   amLODipine (NORVASC) 5 MG tablet TAKE 1 TABLET BY MOUTH EVERY DAY 90 tablet 3   aspirin EC 81 MG tablet Take 81 mg by mouth daily after supper.     blood glucose meter kit and supplies Dispense based on patient and insurance preference. Use up to four times daily as directed. (FOR ICD-10 E10.9, E11.9). 1 each 0   Blood Glucose Monitoring Suppl (ONE TOUCH ULTRA 2) w/Device KIT Use as directed E 11.9 1 each 0   brimonidine (ALPHAGAN) 0.2 % ophthalmic solution ADMINISTER 1 DROP TO BOTH EYES THREE (3) TIMES A DAY.  3   carvedilol (COREG) 3.125 MG tablet TAKE 1 TABLET (3.125 MG TOTAL) BY MOUTH 2 (TWO) TIMES DAILY WITH A MEAL. 180 tablet 3   empagliflozin (JARDIANCE) 25 MG TABS tablet Take 1 tablet (25 mg total) by mouth daily before breakfast. 90 tablet 3   glipiZIDE (GLUCOTROL XL) 5 MG 24 hr tablet TAKE 1 TABLET EVERY DAY WITH BREAKFAST ANNUAL APPT DUE IN JULY MUST SEE PROVIDER FOR FUTURE REFILLS 90 tablet 1   Lancets MISC Use as directed twice daily E11.9 200 each 3   latanoprost (XALATAN) 0.005 % ophthalmic solution Place 1 drop into both eyes at bedtime.  12   losartan (COZAAR) 100 MG tablet TAKE 1 TABLET EVERY DAY 90 tablet 1   metFORMIN (GLUCOPHAGE) 500 MG tablet Take 500 mg by mouth 2 (two) times daily with a meal.     nitroGLYCERIN (NITROSTAT) 0.4 MG SL tablet Place 1 tablet (0.4 mg total) under the tongue every 5 (five) minutes as needed for chest pain. 25 tablet 3   pantoprazole (PROTONIX) 40 MG tablet TAKE 1 TABLET BY MOUTH EVERY DAY 90 tablet 3   triamcinolone (NASACORT) 55 MCG/ACT AERO nasal inhaler Place 2 sprays into the nose daily. 1 Inhaler 12   triamcinolone ointment (KENALOG) 0.1 %      metFORMIN (GLUCOPHAGE-XR) 500 MG 24 hr tablet      No current facility-administered medications on file prior to visit.        ROS:  All others reviewed and negative.  Objective        PE:  BP 122/68 (BP Location: Left Arm, Patient Position: Sitting, Cuff Size: Normal)    Pulse 62   Temp 98.3 F (36.8 C) (Oral)   Ht '5\' 10"'  (1.778 m)   Wt 153 lb 9.6 oz (69.7 kg)   SpO2 97%   BMI 22.04 kg/m                 Constitutional: Pt appears in NAD               HENT: Head: NCAT.  Right Ear: External ear normal.                 Left Ear: External ear normal.                Eyes: . Pupils are equal, round, and reactive to light. Conjunctivae and EOM are normal               Nose: without d/c or deformity               Neck: Neck supple. Gross normal ROM               Cardiovascular: Normal rate and regular rhythm.                 Pulmonary/Chest: Effort normal and breath sounds without rales or wheezing.                Abd:  Soft, NT, ND, + BS, no organomegaly               Neurological: Pt is alert. At baseline orientation, motor grossly intact               Skin: Skin is warm. No rashes, no other new lesions, LE edema - none               Psychiatric: Pt behavior is normal without agitation   Micro: none  Cardiac tracings I have personally interpreted today:  none  Pertinent Radiological findings (summarize): none   Lab Results  Component Value Date   WBC 6.5 03/08/2021   HGB 15.5 03/08/2021   HCT 45.9 03/08/2021   PLT 190.0 03/08/2021   GLUCOSE 130 (H) 03/08/2021   CHOL 149 11/29/2020   TRIG 166.0 (H) 11/29/2020   HDL 39.40 11/29/2020   LDLDIRECT 82.0 05/19/2018   LDLCALC 76 11/29/2020   ALT 21 03/08/2021   AST 18 03/08/2021   NA 139 03/08/2021   K 4.2 03/08/2021   CL 103 03/08/2021   CREATININE 1.06 03/08/2021   BUN 20 03/08/2021   CO2 29 03/08/2021   TSH 2.60 11/29/2020   PSA 2.49 12/21/2012   INR 1.15 12/10/2016   HGBA1C 7.4 (H) 03/08/2021   MICROALBUR 3.5 (H) 11/30/2019   Assessment/Plan:  Keith Herrera is a 87 y.o. White or Caucasian [1] male with  has a past medical history of Adenomatous polyp of colon (05/2004), Allergic rhinitis, Anxiety, BPH (benign prostatic hyperplasia), CAD (coronary artery disease), Carotid  stenosis, Diabetes type 2, controlled ([place]), Diverticulosis, ED (erectile dysfunction), Esophageal stricture, GERD (gastroesophageal reflux disease), Hemorrhoids, Hiatal hernia, Hyperlipidemia, Hypertension, IBS (irritable bowel syndrome), PVC's (premature ventricular contractions) (01/21/2020), Right inguinal hernia, and SVT (supraventricular tachycardia) ([place]).  Aortic atherosclerosis ([place]) To continue diet low cholesterol, excerise, and crestor 10  Essential hypertension BP Readings from Last 3 Encounters:  05/29/21 122/68  03/08/21 122/60  03/08/21 122/60   Stable, pt to continue medical treatment norvasc, coreg, losartan   Hyperlipidemia Lab Results  Component Value Date   LDLCALC 76 11/29/2020   Mild uncontrolled, goal ldl < 70 - pt to change current lipitor to simvastatin 40   RLS (restless legs syndrome) Uncontrolled, recent worsening, for gabapentin 300 mg - 1 -2 hs prn  Type 2 diabetes mellitus with vascular disease ([place]) Lab Results  Component Value Date   HGBA1C 7.4 (H) 03/08/2021   Stable, pt to continue current medical treatment jardiacne, metformin, glucotrol, with goal < 7.5 a1c,  Vertigo Recent onset mild worsening,  exam benign, for meclizine restart prn,  to f/u any worsening symptoms or concerns   Vitamin D deficiency Last vitamin D Lab Results  Component Value Date   VD25OH 30.98 03/08/2021   Low, to start oral replacement - 2000 qd  Followup: No follow-ups on file.  Cathlean Cower, MD 06/03/2021 9:48 AM [facility]

## 2021-06-03 ENCOUNTER — Encounter: Payer: Self-pay | Admitting: Internal Medicine

## 2021-06-03 DIAGNOSIS — G2581 Restless legs syndrome: Secondary | ICD-10-CM | POA: Insufficient documentation

## 2021-06-03 NOTE — Assessment & Plan Note (Signed)
Recent onset mild worsening, exam benign, for meclizine restart prn,  to f/u any worsening symptoms or concerns

## 2021-06-03 NOTE — Assessment & Plan Note (Addendum)
Lab Results  Component Value Date   LDLCALC 76 11/29/2020   Mild uncontrolled, goal ldl < 70 - pt to change current lipitor to simvastatin 40

## 2021-06-03 NOTE — Assessment & Plan Note (Signed)
Lab Results  Component Value Date   HGBA1C 7.4 (H) 03/08/2021   Stable, pt to continue current medical treatment jardiacne, metformin, glucotrol, with goal < 7.5 a1c,

## 2021-06-03 NOTE — Assessment & Plan Note (Signed)
Uncontrolled, recent worsening, for gabapentin 300 mg - 1 -2 hs prn

## 2021-06-03 NOTE — Assessment & Plan Note (Signed)
Last vitamin D Lab Results  Component Value Date   VD25OH 30.98 03/08/2021   Low, to start oral replacement - 2000 qd

## 2021-06-03 NOTE — Assessment & Plan Note (Signed)
To continue diet low cholesterol, excerise, and crestor 10

## 2021-06-03 NOTE — Assessment & Plan Note (Signed)
BP Readings from Last 3 Encounters:  05/29/21 122/68  03/08/21 122/60  03/08/21 122/60   Stable, pt to continue medical treatment norvasc, coreg, losartan

## 2021-07-05 ENCOUNTER — Ambulatory Visit: Payer: Medicare HMO | Admitting: Emergency Medicine

## 2021-07-30 ENCOUNTER — Other Ambulatory Visit: Payer: Self-pay | Admitting: Internal Medicine

## 2021-07-30 NOTE — Telephone Encounter (Signed)
Please refill as per office routine med refill policy (all routine meds to be refilled for 3 mo or monthly (per pt preference) up to one year from last visit, then month to month grace period for 3 mo, then further med refills will have to be denied) ? ?

## 2021-08-13 ENCOUNTER — Other Ambulatory Visit: Payer: Self-pay | Admitting: Physician Assistant

## 2021-08-20 ENCOUNTER — Other Ambulatory Visit: Payer: Self-pay | Admitting: Internal Medicine

## 2021-08-28 ENCOUNTER — Other Ambulatory Visit: Payer: Self-pay | Admitting: Cardiovascular Disease

## 2021-08-28 ENCOUNTER — Other Ambulatory Visit: Payer: Self-pay | Admitting: Internal Medicine

## 2021-08-28 NOTE — Telephone Encounter (Signed)
Please refill as per office routine med refill policy (all routine meds to be refilled for 3 mo or monthly (per pt preference) up to one year from last visit, then month to month grace period for 3 mo, then further med refills will have to be denied) ? ?

## 2021-08-29 ENCOUNTER — Encounter: Payer: Self-pay | Admitting: Internal Medicine

## 2021-08-29 ENCOUNTER — Ambulatory Visit (INDEPENDENT_AMBULATORY_CARE_PROVIDER_SITE_OTHER): Payer: Medicare HMO | Admitting: Internal Medicine

## 2021-08-29 ENCOUNTER — Other Ambulatory Visit: Payer: Self-pay

## 2021-08-29 VITALS — BP 118/70 | HR 44 | Ht 70.0 in | Wt 157.0 lb

## 2021-08-29 DIAGNOSIS — I10 Essential (primary) hypertension: Secondary | ICD-10-CM

## 2021-08-29 DIAGNOSIS — E7800 Pure hypercholesterolemia, unspecified: Secondary | ICD-10-CM | POA: Diagnosis not present

## 2021-08-29 DIAGNOSIS — E559 Vitamin D deficiency, unspecified: Secondary | ICD-10-CM

## 2021-08-29 DIAGNOSIS — E1159 Type 2 diabetes mellitus with other circulatory complications: Secondary | ICD-10-CM

## 2021-08-29 LAB — LIPID PANEL
Cholesterol: 124 mg/dL (ref 0–200)
HDL: 36.8 mg/dL — ABNORMAL LOW (ref >39.00)
LDL Cholesterol: 49 mg/dL (ref 0–99)
NonHDL: 87.05
Total CHOL/HDL Ratio: 3
Triglycerides: 189 mg/dL — ABNORMAL HIGH (ref 0.0–149.0)
VLDL: 37.8 mg/dL (ref 0.0–40.0)

## 2021-08-29 LAB — HEPATIC FUNCTION PANEL
ALT: 17 U/L (ref 0–53)
AST: 16 U/L (ref 0–37)
Albumin: 4.1 g/dL (ref 3.5–5.2)
Alkaline Phosphatase: 41 U/L (ref 39–117)
Bilirubin, Direct: 0.2 mg/dL (ref 0.0–0.3)
Total Bilirubin: 0.8 mg/dL (ref 0.2–1.2)
Total Protein: 6.7 g/dL (ref 6.0–8.3)

## 2021-08-29 LAB — BASIC METABOLIC PANEL
BUN: 23 mg/dL (ref 6–23)
CO2: 29 mEq/L (ref 19–32)
Calcium: 9.6 mg/dL (ref 8.4–10.5)
Chloride: 105 mEq/L (ref 96–112)
Creatinine, Ser: 1.06 mg/dL (ref 0.40–1.50)
GFR: 63.12 mL/min (ref >60.00)
Glucose, Bld: 123 mg/dL — ABNORMAL HIGH (ref 70–99)
Potassium: 4.3 mEq/L (ref 3.5–5.1)
Sodium: 141 mEq/L (ref 135–145)

## 2021-08-29 LAB — HEMOGLOBIN A1C: Hgb A1c MFr Bld: 7.4 % — ABNORMAL HIGH (ref 4.6–6.5)

## 2021-08-29 LAB — VITAMIN D 25 HYDROXY (VIT D DEFICIENCY, FRACTURES): VITD: 40.63 ng/mL (ref 30.00–100.00)

## 2021-08-29 MED ORDER — CANAGLIFLOZIN 300 MG PO TABS: 300.0000 mg | ORAL_TABLET | Freq: Every day | ORAL | 3 refills | Status: DC

## 2021-08-29 MED ORDER — AMLODIPINE BESYLATE 2.5 MG PO TABS: 2.5000 mg | ORAL_TABLET | Freq: Every day | ORAL | 3 refills | Status: DC

## 2021-08-29 NOTE — Assessment & Plan Note (Addendum)
Last vitamin D Lab Results  Component Value Date   VD25OH 30.98 03/08/2021   Low, to increase oral replacement to 4000 u qd

## 2021-08-29 NOTE — Progress Notes (Signed)
Chief Complaint: follow up HTN, HLD and hyperglycemia,, low vit d       HPI:  Keith Herrera is a 87 y.o. male here overall doing ok, taking Vit D he thinks 2000 u qd,  Pt denies polydipsia, polyuria, or low sugar symptoms ; Jardiance too expensive however so not taking.  Pt denies chest pain, increased sob or doe, wheezing, orthopnea, PND, increased LE swelling, palpitations, dizziness or syncope.   Pt denies fever, wt loss, night sweats, loss of appetite, or other constitutional symptoms   Wt Readings from Last 3 Encounters:  08/29/21 157 lb (71.2 kg)  05/29/21 153 lb 9.6 oz (69.7 kg)  03/08/21 150 lb 9.6 oz (68.3 kg)   BP Readings from Last 3 Encounters:  08/29/21 118/70  05/29/21 122/68  03/08/21 122/60         Past Medical History:  Diagnosis Date   Adenomatous polyp of colon 05/2004   Allergic rhinitis    Anxiety    BPH (benign prostatic hyperplasia)    CAD (coronary artery disease)    s/p Cypher DES to LAD and pD1 2010;  LHC was done 6/12: EF 65%, circumflex patent, mid RCA 80-90% (small and nondominant), LAD and diagonal stents patent, LAD at the origin of the first diagonal 30%, ostial D2 90%, mid 80-90% (small vessel).  Continued medical therapy was recommended.    Carotid stenosis    dopplers 02/2012: 4-81% RICA; 85-63% LICA   Diabetes type 2, controlled (HCC)    Diverticulosis    colon   ED (erectile dysfunction)    Esophageal stricture    GERD (gastroesophageal reflux disease)    Hemorrhoids    Hiatal hernia    Hyperlipidemia    Hypertension    IBS (irritable bowel syndrome)    PVC's (premature ventricular contractions) 01/21/2020   Monitor 07/2019: PVC burden 21.6% // Echocardiogram 01/2020: EF 65-70, no RWMA, Gr 2 DD, normal RVSF, RVSP 43.3 mmHg (mod pul HTN), mild LAE, mild MR, trivial AI   Right inguinal hernia    s/p repair in 8/12   SVT (supraventricular tachycardia) ([place])    Past Surgical History:  Procedure Laterality Date   CARDIAC CATHETERIZATION  N/A 12/10/2016   Procedure: Left Heart Cath and Coronary Angiography;  Surgeon: Jettie Booze, MD;  Location: [place];  Service: Cardiovascular;  Laterality: N/A;   CARDIAC CATHETERIZATION N/A 12/10/2016   Procedure: Coronary Balloon Angioplasty;  Surgeon: Jettie Booze, MD;  Location: [facility];  Service: Cardiovascular;  Laterality: N/A;   CARDIAC CATHETERIZATION N/A 12/10/2016   Procedure: Intravascular Pressure Wire/FFR Study;  Surgeon: Jettie Booze, MD;  Location: [facility];  Service: Cardiovascular;  Laterality: N/A;   CORONARY STENT PLACEMENT  10/2008   x 2   ELECTROPHYSIOLOGIC STUDY N/A 03/20/2015   no inducible SVT - Dr Irish Elders HERNIA REPAIR Right    RIH with ultrapro patch    reports that he quit smoking about 65 years ago. His smoking use included cigarettes. He has never used smokeless tobacco. He reports that he does not drink alcohol and does not use drugs. family history includes Bladder Cancer (age of onset: 35) in his mother; Healthy in his daughter and son; Heart attack (age of onset: 38) in his father; Pancreatic cancer (age of onset: 27) in his sister. Allergies  Allergen Reactions   Crestor [Rosuvastatin Calcium] Rash    All over body   Lovastatin  Itching and Rash   Current Outpatient Medications on File Prior to Visit  Medication Sig Dispense Refill   ACCU-CHEK AVIVA PLUS test strip USE FOR TESTING UP TO 4 TIMES DAILY AS DIRECTED 100 strip 2   aspirin EC 81 MG tablet Take 81 mg by mouth daily after supper.     blood glucose meter kit and supplies Dispense based on patient and insurance preference. Use up to four times daily as directed. (FOR ICD-10 E10.9, E11.9). 1 each 0   Blood Glucose Monitoring Suppl (ONE TOUCH ULTRA 2) w/Device KIT Use as directed E 11.9 1 each 0   brimonidine (ALPHAGAN) 0.2 % ophthalmic solution ADMINISTER 1 DROP TO BOTH EYES THREE (3) TIMES A DAY.  3   carvedilol (COREG) 3.125 MG tablet Take  1 tablet (3.125 mg total) by mouth 2 (two) times daily with a meal. 180 tablet 1   finasteride (PROSCAR) 5 MG tablet Take 1 tablet (5 mg total) by mouth daily. 90 tablet 3   gabapentin (NEURONTIN) 300 MG capsule 1 - 2 tab by mouth at bedtime for restless leg and sleep 180 capsule 1   glipiZIDE (GLUCOTROL XL) 5 MG 24 hr tablet TAKE 1 TABLET EVERY DAY WITH BREAKFAST ANNUAL APPT DUE IN JULY MUST SEE PROVIDER FOR FUTURE REFILLS 90 tablet 1   Lancets MISC Use as directed twice daily E11.9 200 each 3   latanoprost (XALATAN) 0.005 % ophthalmic solution Place 1 drop into both eyes at bedtime.  12   meclizine (ANTIVERT) 12.5 MG tablet Take 1 tablet (12.5 mg total) by mouth 3 (three) times daily as needed for dizziness. 90 tablet 2   metFORMIN (GLUCOPHAGE) 500 MG tablet Take 500 mg by mouth 2 (two) times daily with a meal.     nitroGLYCERIN (NITROSTAT) 0.4 MG SL tablet Place 1 tablet (0.4 mg total) under the tongue every 5 (five) minutes as needed for chest pain. 25 tablet 3   pantoprazole (PROTONIX) 40 MG tablet TAKE 1 TABLET BY MOUTH EVERY DAY 90 tablet 3   simvastatin (ZOCOR) 40 MG tablet Take 1 tablet (40 mg total) by mouth at bedtime. 90 tablet 3   triamcinolone (NASACORT) 55 MCG/ACT AERO nasal inhaler Place 2 sprays into the nose daily. 1 Inhaler 12   triamcinolone ointment (KENALOG) 0.1 %      losartan (COZAAR) 100 MG tablet TAKE 1 TABLET BY MOUTH EVERY DAY 90 tablet 3   metFORMIN (GLUCOPHAGE-XR) 500 MG 24 hr tablet TAKE 3 TABLETS (1,500 MG TOTAL) BY MOUTH DAILY WITH BREAKFAST. 270 tablet 3   No current facility-administered medications on file prior to visit.        ROS:  All others reviewed and negative.  Objective        PE:  BP 118/70 (BP Location: Right Arm, Patient Position: Sitting, Cuff Size: Normal)   Pulse (!) 44   Ht '5\' 10"'  (1.778 m)   Wt 157 lb (71.2 kg)   SpO2 99%   BMI 22.53 kg/m                 Constitutional: Pt appears in NAD               HENT: Head: NCAT.                 Right Ear: External ear normal.                 Left Ear: External ear normal.  Eyes: . Pupils are equal, round, and reactive to light. Conjunctivae and EOM are normal               Nose: without d/c or deformity               Neck: Neck supple. Gross normal ROM               Cardiovascular: Normal rate and regular rhythm.                 Pulmonary/Chest: Effort normal and breath sounds without rales or wheezing.                Abd:  Soft, NT, ND, + BS, no organomegaly               Neurological: Pt is alert. At baseline orientation, motor grossly intact               Skin: Skin is warm. No rashes, no other new lesions, LE edema - none               Psychiatric: Pt behavior is normal without agitation   Micro: none  Cardiac tracings I have personally interpreted today:  none  Pertinent Radiological findings (summarize): none   Lab Results  Component Value Date   WBC 6.5 03/08/2021   HGB 15.5 03/08/2021   HCT 45.9 03/08/2021   PLT 190.0 03/08/2021   GLUCOSE 123 (H) 08/29/2021   CHOL 124 08/29/2021   TRIG 189.0 (H) 08/29/2021   HDL 36.80 (L) 08/29/2021   LDLDIRECT 82.0 05/19/2018   LDLCALC 49 08/29/2021   ALT 17 08/29/2021   AST 16 08/29/2021   NA 141 08/29/2021   K 4.3 08/29/2021   CL 105 08/29/2021   CREATININE 1.06 08/29/2021   BUN 23 08/29/2021   CO2 29 08/29/2021   TSH 2.60 11/29/2020   PSA 2.49 12/21/2012   INR 1.15 12/10/2016   HGBA1C 7.4 (H) 08/29/2021   MICROALBUR 3.5 (H) 11/30/2019   Assessment/Plan:  SHANN MERRICK is a 87 y.o. White or Caucasian [1] male with  has a past medical history of Adenomatous polyp of colon (05/2004), Allergic rhinitis, Anxiety, BPH (benign prostatic hyperplasia), CAD (coronary artery disease), Carotid stenosis, Diabetes type 2, controlled ([place]), Diverticulosis, ED (erectile dysfunction), Esophageal stricture, GERD (gastroesophageal reflux disease), Hemorrhoids, Hiatal hernia, Hyperlipidemia, Hypertension, IBS (irritable  bowel syndrome), PVC's (premature ventricular contractions) (01/21/2020), Right inguinal hernia, and SVT (supraventricular tachycardia) ([place]).  Vitamin D deficiency Last vitamin D Lab Results  Component Value Date   VD25OH 30.98 03/08/2021   Low, to increase oral replacement to 4000 u qd  Hyperlipidemia Lab Results  Component Value Date   LDLCALC 49 08/29/2021   Stable, pt to continue current statin zocor 40   Essential hypertension BP Readings from Last 3 Encounters:  08/29/21 118/70  05/29/21 122/68  03/08/21 122/60   Low normal, I suspect with age and wt loss now getting to be overcontrolled, pt to decrease medical treatment amlodipien 2.5 mg   Type 2 diabetes mellitus with vascular disease ([place]) Lab Results  Component Value Date   HGBA1C 7.4 (H) 08/29/2021   Mild uncontrolled but ok for age to goal < 7.5,, pt to try change jardiance to [facility] and hopefully more affordable  Followup: Return in about 6 months (around 02/27/2022).  Cathlean Cower, MD 09/01/2021 5:04 PM [facility]

## 2021-08-29 NOTE — Patient Instructions (Signed)
Ok to change the jardiance to the [facility] for sugar, and hopefully the cost will be ok  Ok to decrease the amlodipine to 2.5 mg per day, to avoid low blood pressures that can make it easier to fall  Ok to increase the Vitamin D to 4000 units per day  Please continue all other medications as before, and refills have been done if requested.  Please have the pharmacy call with any other refills you may need.  Please keep your appointments with your specialists as you may have planned  Please go to the LAB at the blood drawing area for the tests to be done  You will be contacted by phone if any changes need to be made immediately.  Otherwise, you will receive a letter about your results with an explanation, but please check with MyChart first.  Please remember to sign up for MyChart if you have not done so, as this will be important to you in the future with finding out test results, communicating by private email, and scheduling acute appointments online when needed.  Please make an Appointment to return in 6 months, or sooner if needed

## 2021-09-01 ENCOUNTER — Encounter: Payer: Self-pay | Admitting: Internal Medicine

## 2021-09-01 NOTE — Assessment & Plan Note (Signed)
Lab Results  Component Value Date   LDLCALC 49 08/29/2021   Stable, pt to continue current statin zocor 40

## 2021-09-01 NOTE — Assessment & Plan Note (Signed)
BP Readings from Last 3 Encounters:  08/29/21 118/70  05/29/21 122/68  03/08/21 122/60   Low normal, I suspect with age and wt loss now getting to be overcontrolled, pt to decrease medical treatment amlodipien 2.5 mg

## 2021-09-01 NOTE — Assessment & Plan Note (Signed)
Lab Results  Component Value Date   HGBA1C 7.4 (H) 08/29/2021   Mild uncontrolled but ok for age to goal < 7.5,, pt to try change jardiance to [facility] and hopefully more affordable

## 2021-09-04 DIAGNOSIS — Z961 Presence of intraocular lens: Secondary | ICD-10-CM | POA: Diagnosis not present

## 2021-09-04 DIAGNOSIS — H5319 Other subjective visual disturbances: Secondary | ICD-10-CM | POA: Diagnosis not present

## 2021-09-04 DIAGNOSIS — H01003 Unspecified blepharitis right eye, unspecified eyelid: Secondary | ICD-10-CM | POA: Diagnosis not present

## 2021-09-04 DIAGNOSIS — H01006 Unspecified blepharitis left eye, unspecified eyelid: Secondary | ICD-10-CM | POA: Diagnosis not present

## 2021-09-04 DIAGNOSIS — H21233 Degeneration of iris (pigmentary), bilateral: Secondary | ICD-10-CM | POA: Diagnosis not present

## 2021-10-15 ENCOUNTER — Encounter: Payer: Self-pay | Admitting: Internal Medicine

## 2021-10-15 NOTE — Progress Notes (Signed)
Subjective:    Patient ID: Keith Herrera, male    DOB: May 26, 1932, 87 y.o.   MRN: [id number]  This visit occurred during the SARS-CoV-2 public health emergency.  Safety protocols were in place, including screening questions prior to the visit, additional usage of staff PPE, and extensive cleaning of exam room while observing appropriate contact time as indicated for disinfecting solutions.    HPI The patient is here for an acute visit.   Dizziness when standing up since 11/26 - the past 3-4 days it has been worse.  If he lays on his left side he gets dizzy, but he is ok when laying on his right. If he gets up quick he also gets dizzy.  The dizziness is a spinning sensation associated with nausea.    He had this 1-2 years ago.  He took pills and it helped.     His left hand tingles - that has been going on for a while.  He had an MRI last December.  He did see neurosurgery following this and there were no concerns on their own.  Medications and allergies reviewed with patient and updated if appropriate.  Patient Active Problem List   Diagnosis Date Noted   RLS (restless legs syndrome) 06/03/2021   Vitamin D deficiency 05/29/2021   Photopsia of right eye 05/04/2021   Pigment dispersion syndrome of both eyes 05/04/2021   Pseudophakia of both eyes 05/04/2021   Gallstones 03/09/2021   Aortic atherosclerosis ([place]) 03/09/2021   Left flank pain 03/08/2021   Brain mass 12/03/2020   Brain tumor ([place]) 11/23/2020   Cervical disc disorder with radiculopathy of cervical region 10/27/2020   Arm paresthesia, left 10/25/2020   Perioral numbness 10/25/2020   Right shoulder pain 10/24/2020   Plantar fasciitis of right foot 05/29/2020   PVC's (premature ventricular contractions) 01/21/2020   Dyspepsia 04/25/2019   Paresthesia of left arm 04/25/2019   Insomnia 11/28/2018   Vertigo 04/16/2018   Left arm pain 02/15/2018   Constipation 12/12/2017   Allergic urticaria 12/01/2017   Thrush  08/19/2017   Weight loss 08/19/2017   Diarrhea 08/19/2017   Urinary frequency 08/19/2017   Cataract, nuclear sclerotic, left eye 05/30/2017   Cataract of both eyes 03/12/2017   Open-angle glaucoma 12/30/2016   Dysphagia 12/18/2016   Abnormal nuclear stress test    Chest pain 12/08/2016   Primary open-angle glaucoma, left eye, severe stage 11/27/2016   Cough 02/06/2016   Dizziness 01/22/2016   Pruritus 12/21/2015   Lower back pain 11/24/2015   SVT (supraventricular tachycardia) ([place]) 01/31/2015   Type 2 diabetes mellitus with vascular disease ([place]) 01/31/2015   Hives 02/22/2013   Carotid bruit 01/15/2012   Encounter for well adult exam with abnormal findings 07/01/2011   BENIGN PROSTATIC HYPERTROPHY 06/26/2010   CAD, NATIVE VESSEL 11/02/2008   INGUINAL HERNIA, RIGHT 10/28/2008   ANXIETY 10/28/2007   ESOPHAGEAL STRICTURE 10/28/2007   NEPHROLITHIASIS, HX OF 10/28/2007   Hyperlipidemia 07/22/2007   ERECTILE DYSFUNCTION 07/22/2007   Essential hypertension 07/22/2007   Chronic rhinitis 07/22/2007   GERD 07/22/2007   DIVERTICULOSIS, COLON 07/22/2007   IBS 07/22/2007   COLONIC POLYPS, HX OF 07/22/2007   HEMORRHOIDS, HX OF 07/22/2007    Current Outpatient Medications on File Prior to Visit  Medication Sig Dispense Refill   ACCU-CHEK AVIVA PLUS test strip USE FOR TESTING UP TO 4 TIMES DAILY AS DIRECTED 100 strip 2   amLODipine (NORVASC) 2.5 MG tablet Take 1 tablet (2.5 mg total) by  mouth daily. 90 tablet 3   aspirin EC 81 MG tablet Take 81 mg by mouth daily after supper.     blood glucose meter kit and supplies Dispense based on patient and insurance preference. Use up to four times daily as directed. (FOR ICD-10 E10.9, E11.9). 1 each 0   Blood Glucose Monitoring Suppl (ONE TOUCH ULTRA 2) w/Device KIT Use as directed E 11.9 1 each 0   brimonidine (ALPHAGAN) 0.2 % ophthalmic solution ADMINISTER 1 DROP TO BOTH EYES THREE (3) TIMES A DAY.  3   brimonidine (ALPHAGAN) 0.2 % ophthalmic  solution Administer 1 drop to both eyes Three (3) times a day.     canagliflozin (INVOKANA) 300 MG TABS tablet Take 1 tablet (300 mg total) by mouth daily before breakfast. 90 tablet 3   carvedilol (COREG) 3.125 MG tablet Take 1 tablet (3.125 mg total) by mouth 2 (two) times daily with a meal. 180 tablet 1   finasteride (PROSCAR) 5 MG tablet Take 1 tablet (5 mg total) by mouth daily. 90 tablet 3   gabapentin (NEURONTIN) 300 MG capsule 1 - 2 tab by mouth at bedtime for restless leg and sleep 180 capsule 1   glipiZIDE (GLUCOTROL XL) 5 MG 24 hr tablet TAKE 1 TABLET EVERY DAY WITH BREAKFAST ANNUAL APPT DUE IN JULY MUST SEE PROVIDER FOR FUTURE REFILLS 90 tablet 1   Lancets MISC Use as directed twice daily E11.9 200 each 3   latanoprost (XALATAN) 0.005 % ophthalmic solution Place 1 drop into both eyes at bedtime.  12   latanoprost (XALATAN) 0.005 % ophthalmic solution Administer 1 drop to both eyes nightly.     losartan (COZAAR) 100 MG tablet TAKE 1 TABLET BY MOUTH EVERY DAY 90 tablet 3   meclizine (ANTIVERT) 12.5 MG tablet Take 1 tablet (12.5 mg total) by mouth 3 (three) times daily as needed for dizziness. 90 tablet 2   metFORMIN (GLUCOPHAGE) 500 MG tablet Take 500 mg by mouth 2 (two) times daily with a meal.     metFORMIN (GLUCOPHAGE-XR) 500 MG 24 hr tablet TAKE 3 TABLETS (1,500 MG TOTAL) BY MOUTH DAILY WITH BREAKFAST. 270 tablet 3   nitroGLYCERIN (NITROSTAT) 0.4 MG SL tablet Place 1 tablet (0.4 mg total) under the tongue every 5 (five) minutes as needed for chest pain. 25 tablet 3   pantoprazole (PROTONIX) 40 MG tablet TAKE 1 TABLET BY MOUTH EVERY DAY 90 tablet 3   simvastatin (ZOCOR) 40 MG tablet Take 1 tablet (40 mg total) by mouth at bedtime. 90 tablet 3   triamcinolone (NASACORT) 55 MCG/ACT AERO nasal inhaler Place 2 sprays into the nose daily. 1 Inhaler 12   triamcinolone ointment (KENALOG) 0.1 %      No current facility-administered medications on file prior to visit.    Past Medical  History:  Diagnosis Date   Adenomatous polyp of colon 05/2004   Allergic rhinitis    Anxiety    BPH (benign prostatic hyperplasia)    CAD (coronary artery disease)    s/p Cypher DES to LAD and pD1 2010;  LHC was done 6/12: EF 65%, circumflex patent, mid RCA 80-90% (small and nondominant), LAD and diagonal stents patent, LAD at the origin of the first diagonal 30%, ostial D2 90%, mid 80-90% (small vessel).  Continued medical therapy was recommended.    Carotid stenosis    dopplers 02/2012: 6-46% RICA; 80-32% LICA   Diabetes type 2, controlled ([place])    Diverticulosis    colon   ED (  erectile dysfunction)    Esophageal stricture    GERD (gastroesophageal reflux disease)    Hemorrhoids    Hiatal hernia    Hyperlipidemia    Hypertension    IBS (irritable bowel syndrome)    PVC's (premature ventricular contractions) 01/21/2020   Monitor 07/2019: PVC burden 21.6% // Echocardiogram 01/2020: EF 65-70, no RWMA, Gr 2 DD, normal RVSF, RVSP 43.3 mmHg (mod pul HTN), mild LAE, mild MR, trivial AI   Right inguinal hernia    s/p repair in 8/12   SVT (supraventricular tachycardia) ([place])     Past Surgical History:  Procedure Laterality Date   CARDIAC CATHETERIZATION N/A 12/10/2016   Procedure: Left Heart Cath and Coronary Angiography;  Surgeon: Jettie Booze, MD;  Location: [facility];  Service: Cardiovascular;  Laterality: N/A;   CARDIAC CATHETERIZATION N/A 12/10/2016   Procedure: Coronary Balloon Angioplasty;  Surgeon: Jettie Booze, MD;  Location: [facility];  Service: Cardiovascular;  Laterality: N/A;   CARDIAC CATHETERIZATION N/A 12/10/2016   Procedure: Intravascular Pressure Wire/FFR Study;  Surgeon: Jettie Booze, MD;  Location: [facility];  Service: Cardiovascular;  Laterality: N/A;   CORONARY STENT PLACEMENT  10/2008   x 2   ELECTROPHYSIOLOGIC STUDY N/A 03/20/2015   no inducible SVT - Dr Irish Elders HERNIA REPAIR Right    RIH with ultrapro patch     Social History   Socioeconomic History   Marital status: Married    Spouse name: Not on file   Number of children: 3   Years of education: Not on file   Highest education level: Not on file  Occupational History   Occupation: Recruitment consultant for [employer]    Employer: RETIRED  Tobacco Use   Smoking status: Former    Types: Cigarettes    Quit date: 03/05/1956    Years since quitting: 65.6   Smokeless tobacco: Never   Tobacco comments:    smoked in teens  Vaping Use   Vaping Use: Never used  Substance and Sexual Activity   Alcohol use: No    Alcohol/week: 0.0 standard drinks   Drug use: No   Sexual activity: Not on file  Other Topics Concern   Not on file  Social History Narrative   Lives with wife in a 3 story home.  Has 2 living children.  1 passed away in a car accident.     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     Social Determinants of Health   Financial Resource Strain: Not on file  Food Insecurity: Not on file  Transportation Needs: No Transportation Needs   Lack of Transportation (Medical): No   Lack of Transportation (Non-Medical): No  Physical Activity: Sufficiently Active   Days of Exercise per Week: 5 days   Minutes of Exercise per Session: 30 min  Stress: Not on file  Social Connections: Socially Integrated   Frequency of Communication with Friends and Family: More than three times a week   Frequency of Social Gatherings with Friends and Family: More than three times a week   Attends Religious Services: More than 4 times per year   Active Member of [employer] or Organizations: Yes   Attends Music therapist: More than 4 times per year   Marital Status: Married    Family History  Problem Relation Age of Onset   Bladder Cancer Mother 3   Heart  attack Father 72       Deceased   Pancreatic cancer Sister 65   Healthy Son    Healthy Daughter    Colon cancer Neg Hx      Review of Systems  Constitutional:  Negative for fever.  HENT:  Positive for congestion (past few days). Negative for ear pain, sinus pain and sore throat.   Eyes:  Negative for visual disturbance.  Respiratory:  Negative for shortness of breath.   Cardiovascular:  Negative for chest pain and palpitations.  Neurological:  Positive for dizziness and headaches (mild - not new). Negative for weakness and numbness (tingling in left hand - chronic).      Objective:   Vitals:   10/16/21 0751  BP: 134/72  Pulse: (!) 52  Temp: 98.1 F (36.7 C)  SpO2: 99%   BP Readings from Last 3 Encounters:  10/16/21 134/72  08/29/21 118/70  05/29/21 122/68   Wt Readings from Last 3 Encounters:  10/16/21 153 lb (69.4 kg)  08/29/21 157 lb (71.2 kg)  05/29/21 153 lb 9.6 oz (69.7 kg)   Body mass index is 21.95 kg/m.   Physical Exam Constitutional:      General: He is not in acute distress.    Appearance: Normal appearance. He is not ill-appearing.  HENT:     Head: Normocephalic and atraumatic.     Right Ear: Tympanic membrane, ear canal and external ear normal. There is no impacted cerumen.     Left Ear: Tympanic membrane, ear canal and external ear normal. There is no impacted cerumen.     Mouth/Throat:     Mouth: Mucous membranes are moist.     Pharynx: No posterior oropharyngeal erythema.  Eyes:     Extraocular Movements: Extraocular movements intact.     Conjunctiva/sclera: Conjunctivae normal.  Neck:     Vascular: No carotid bruit.  Cardiovascular:     Rate and Rhythm: Normal rate and regular rhythm.     Heart sounds: No murmur heard. Pulmonary:     Effort: Pulmonary effort is normal. No respiratory distress.     Breath sounds: No wheezing or rales.  Musculoskeletal:     Cervical back: Neck supple. No tenderness.     Right lower leg: No edema.     Left lower leg: No edema.  Lymphadenopathy:     Cervical: No cervical adenopathy.  Skin:    General: Skin is warm and dry.   Neurological:     Mental Status: He is alert and oriented to person, place, and time.     Cranial Nerves: No cranial nerve deficit.     Sensory: No sensory deficit.     Motor: No weakness.            Assessment & Plan:    BPPV: Acute Started a few days ago and has not when he lays on his left side moves quickly or gets up quickly Has had similar symptoms a few years ago took meclizine Symptoms consistent with BPPV No concerning neurological symptoms or deficits on exam MRI reviewed from last year Start meclizine 12.5 mg 3 times daily as needed-reminded him that this medication can cause some drowsiness and to be careful when he takes it  Advised him to call or return if his symptoms or not improving

## 2021-10-16 ENCOUNTER — Ambulatory Visit (INDEPENDENT_AMBULATORY_CARE_PROVIDER_SITE_OTHER): Payer: Medicare HMO | Admitting: Internal Medicine

## 2021-10-16 ENCOUNTER — Other Ambulatory Visit: Payer: Self-pay

## 2021-10-16 VITALS — BP 134/72 | HR 52 | Temp 98.1°F | Ht 70.0 in | Wt 153.0 lb

## 2021-10-16 DIAGNOSIS — H8112 Benign paroxysmal vertigo, left ear: Secondary | ICD-10-CM | POA: Diagnosis not present

## 2021-10-16 MED ORDER — MECLIZINE HCL 12.5 MG PO TABS: 12.5000 mg | ORAL_TABLET | Freq: Three times a day (TID) | ORAL | 0 refills | Status: DC | PRN

## 2021-10-16 NOTE — Patient Instructions (Signed)
     Medications changes include :   meclizine 12. 5 mg three times a day as needed  Your prescription(s) have been submitted to your pharmacy. Please take as directed and contact our office if you believe you are having problem(s) with the medication(s).

## 2021-11-20 ENCOUNTER — Other Ambulatory Visit: Payer: Self-pay | Admitting: Physician Assistant

## 2021-12-12 ENCOUNTER — Other Ambulatory Visit: Payer: Self-pay | Admitting: Internal Medicine

## 2021-12-17 ENCOUNTER — Other Ambulatory Visit: Payer: Self-pay | Admitting: Internal Medicine

## 2022-01-03 ENCOUNTER — Other Ambulatory Visit: Payer: Self-pay | Admitting: Internal Medicine

## 2022-01-04 ENCOUNTER — Other Ambulatory Visit: Payer: Self-pay | Admitting: Internal Medicine

## 2022-01-04 NOTE — Telephone Encounter (Signed)
Please refill as per office routine med refill policy (all routine meds to be refilled for 3 mo or monthly (per pt preference) up to one year from last visit, then month to month grace period for 3 mo, then further med refills will have to be denied) ? ?

## 2022-01-22 ENCOUNTER — Telehealth: Payer: Self-pay

## 2022-01-22 NOTE — Telephone Encounter (Signed)
Pt is requesting a refill on: ?triamcinolone ointment (KENALOG) 0.1 %. ? ? ?Pharmacy: ?[id number] [place], [place] - [address]? ?LOV 08/29/21 ?ROV 02/28/22 ? ?

## 2022-01-23 MED ORDER — TRIAMCINOLONE ACETONIDE 0.1 % EX CREA: 1.0000 "application " | TOPICAL_CREAM | Freq: Two times a day (BID) | CUTANEOUS | 1 refills | Status: DC

## 2022-01-23 NOTE — Telephone Encounter (Signed)
Ok done erx 

## 2022-01-24 NOTE — Telephone Encounter (Signed)
Called and left message for patient to inform that his medication was sent to his pharmacy on file  ?

## 2022-01-30 ENCOUNTER — Other Ambulatory Visit: Payer: Self-pay | Admitting: Internal Medicine

## 2022-01-30 NOTE — Telephone Encounter (Signed)
Please refill as per office routine med refill policy (all routine meds to be refilled for 3 mo or monthly (per pt preference) up to one year from last visit, then month to month grace period for 3 mo, then further med refills will have to be denied) ? ?

## 2022-02-11 ENCOUNTER — Encounter: Payer: Self-pay | Admitting: *Deleted

## 2022-02-11 ENCOUNTER — Other Ambulatory Visit: Payer: Self-pay

## 2022-02-11 ENCOUNTER — Ambulatory Visit: Payer: Medicare HMO | Admitting: Physician Assistant

## 2022-02-11 ENCOUNTER — Encounter: Payer: Self-pay | Admitting: Physician Assistant

## 2022-02-11 VITALS — BP 118/50 | HR 50 | Ht 70.0 in | Wt 157.0 lb

## 2022-02-11 DIAGNOSIS — R0602 Shortness of breath: Secondary | ICD-10-CM

## 2022-02-11 DIAGNOSIS — R001 Bradycardia, unspecified: Secondary | ICD-10-CM | POA: Insufficient documentation

## 2022-02-11 DIAGNOSIS — I493 Ventricular premature depolarization: Secondary | ICD-10-CM

## 2022-02-11 DIAGNOSIS — I700 Atherosclerosis of aorta: Secondary | ICD-10-CM | POA: Diagnosis not present

## 2022-02-11 DIAGNOSIS — E785 Hyperlipidemia, unspecified: Secondary | ICD-10-CM

## 2022-02-11 DIAGNOSIS — I25118 Atherosclerotic heart disease of native coronary artery with other forms of angina pectoris: Secondary | ICD-10-CM

## 2022-02-11 DIAGNOSIS — R072 Precordial pain: Secondary | ICD-10-CM | POA: Diagnosis not present

## 2022-02-11 DIAGNOSIS — I10 Essential (primary) hypertension: Secondary | ICD-10-CM | POA: Diagnosis not present

## 2022-02-11 MED ORDER — METOPROLOL SUCCINATE ER 25 MG PO TB24: 12.5000 mg | ORAL_TABLET | Freq: Every day | ORAL | 3 refills | Status: DC

## 2022-02-11 MED ORDER — ISOSORBIDE MONONITRATE ER 30 MG PO TB24: 15.0000 mg | ORAL_TABLET | Freq: Every day | ORAL | 3 refills | Status: DC

## 2022-02-11 NOTE — Assessment & Plan Note (Signed)
Blood pressure is well controlled.  Continue Norvasc 2.5 mg daily, losartan 100 mg daily.  Change carvedilol to metoprolol succinate as noted. ?

## 2022-02-11 NOTE — Assessment & Plan Note (Signed)
Continue aspirin, statin.  

## 2022-02-11 NOTE — Assessment & Plan Note (Signed)
LDL in October was optimal.  Continue simvastatin 40 mg daily. ?

## 2022-02-11 NOTE — Assessment & Plan Note (Signed)
He describes symptoms of dizziness from time to time.  He has not had syncope.  He is already on the lowest dose possible for carvedilol.  He has been on beta-blocker therapy to help suppress PVCs.  I will stop his carvedilol.  I will place him on metoprolol succinate 12.5 mg daily.  If he continues to have symptoms with this, we will need to consider +/- repeat cardiac monitor as well as whether or not to stop his beta-blocker. ?

## 2022-02-11 NOTE — Assessment & Plan Note (Signed)
As noted, monitor in the past has demonstrated 22% PVCs.  His EF has been normal.  He has been managed with low-dose beta-blocker therapy.  Adjust carvedilol to metoprolol succinate as noted.  Obtain follow-up echocardiogram.  If EF is down and there is no significant ischemia, we may need to send him back to EP for evaluation. ?

## 2022-02-11 NOTE — Patient Instructions (Addendum)
Medication Instructions:  ?Your physician has recommended you make the following change in your medication:  ?1.Stop carvedilol (Coreg) ?2.Start metoprolol succinate (Toprol XL) 12.5 mg at bedtime ?3.Start isosorbide mononitrate (Imdur) 15 mg by mouth daily ?*If you need a refill on your cardiac medications before your next appointment, please call your pharmacy* ? ? ?Lab Work: ?BMET, BNP, CBC today ?If you have labs (blood work) drawn today and your tests are completely normal, you will receive your results only by: ?MyChart Message (if you have MyChart) OR ?A paper copy in the mail ?If you have any lab test that is abnormal or we need to change your treatment, we will call you to review the results. ? ? ?Testing/Procedures: ?Your physician has requested that you have an echocardiogram. Echocardiography is a painless test that uses sound waves to create images of your heart. It provides your doctor with information about the size and shape of your heart and how well your heart?s chambers and valves are working. This procedure takes approximately one hour. There are no restrictions for this procedure.  ? ?Your physician has requested that you have a lexiscan myoview. For further information please visit [email or web address]. Please follow instruction sheet, as given. ? ? ?Follow-Up: ?At [facility], you and your health needs are our priority.  As part of our continuing mission to provide you with exceptional heart care, we have created designated Provider Care Teams.  These Care Teams include your primary Cardiologist (physician) and Advanced Practice Providers (APPs -  Physician Assistants and Nurse Practitioners) who all work together to provide you with the care you need, when you need it. ? ?We recommend signing up for the patient portal called "MyChart".  Sign up information is provided on this After Visit Summary.  MyChart is used to connect with patients for Virtual Visits (Telemedicine).  Patients are able  to view lab/test results, encounter notes, upcoming appointments, etc.  Non-urgent messages can be sent to your provider as well.   ?To learn more about what you can do with MyChart, go to [email or web address].   ? ?Your next appointment:   ?1 month(s) ? ?The format for your next appointment:   ?In Person ? ?Provider:   ?Sherren Mocha, MD  or Richardson Dopp, PA-C     { ?

## 2022-02-11 NOTE — Progress Notes (Signed)
?Cardiology Office Note:   ? ?Date:  02/11/2022  ? ?ID:  Keith Herrera, DOB Oct 15, 1932, MRN [id number] ? ?PCP:  [name], MD  ?[facility] Providers ?Cardiologist:  Sherren Mocha, MD ?Cardiology APP:  Liliane Shi, PA-C  ?Electrophysiologist:  Cristopher Peru, MD    ?Referring MD: [name], MD  ? ?Chief Complaint:  F/u for CAD ?  ? ?Patient Profile: ?Coronary artery disease  ?S/p DES to LAD and D1 in 2010 ?S/p POBA to D1 in 11/2016 (no stent due to prior PCI) ?Carotid artery disease ?Aortic atherosclerosis  ?PSVT ?EPS in 2016 >> no inducible SVT ?PVCs ?Symptomatic; eval by Dr. Lovena Le in past (tx options limited due to CAD) ?Monitor 07/2019: PVC burden 21.6% ?Hypertension  ?Hyperlipidemia  ?Diabetes mellitus 2  ?GERD  ?Esophageal stricture ?Prior hx of chest pain of GI origin ?  ?Prior CV studies: ?Echocardiogram 01/20/20 ?EF 65-70, no RWMA, Gr 2 DD, normal RVSF, RVSP 43.3 (mod elevated), mild LAE, mild MR, trivial AI ?  ?LT 3-14 day monitor 07/2019 ?1) the basic rhythm is normal sinus with an average HR of 60 bpm ?2) there are occasional PAC's with short supraventricular runs, longest 12 beats ?3) frequent PVC's with PVC burden of 21.6%, but no ventricular runs ?4) no pathologic pauses ?5) no atrial fibrillation or flutter ?  ?48 Hr Holter 12/2016 ?1. NSR and sinus bradycardia ?2. Frequent PAC's and rare PVC's ?3. Daytime sinus bradycardia is present with HR's down to 40/min ?4. Very brief NS atrial and NS ventricular tachycardia ?5. No long pauses observed. ?  ?Cardiac catheterization 12/10/16 ?LAD prox stent patent; D1 ost stent patent then 42; D1 ost 75 ?LCx irregs ?RCA mid 100 (L-R collats) ?PCI: POBA to D1 ?  ?Echocardiogram 12/09/2016 ?EF 65-70, no RWMA, Gr 1 DD, mild TR ?  ?Carotid US 04/2016 ?Bilat 1-39; normal subclavians ?  ?Myoview 06/10/14 ?Low risk stress nuclear study Normal myocardial perfusion with no evidence of ischemia.  EKG showed baseline ST abnormality with 22m of horizontal to upsloping  ST segment depression during exercise.  Fair exercise tolerance.   ?LV Ejection Fraction: 71%.  LV Wall Motion:  NL LV Function; NL Wall Motion ? ? ?History of Present Illness:   ?Keith BELCHERis a 88y.o. male with the above problem list.  He was last seen by Dr. CBurt Knackin March 2022.  He returns for f/u.  He is here alone.  Over the past couple months, he has noted chest discomfort.  He has had this with certain types of exertion (walking up hills).  He can walk on a treadmill without symptoms.  He also has left arm discomfort and tingling from time to time.  This is unrelated to his chest pain.  He also notes shortness of breath with exertion which seems to be new for him.  He has taken nitroglycerin twice in the last year with relief of chest pain.  He has not had syncope but does note lightheadedness/near syncope at times.  He has not had orthopnea, leg edema.     ?   ?Past Medical History:  ?Diagnosis Date  ? Adenomatous polyp of colon 05/2004  ? Allergic rhinitis   ? Anxiety   ? BPH (benign prostatic hyperplasia)   ? CAD (coronary artery disease)   ? s/p Cypher DES to LAD and pD1 2010;  LHC was done 6/12: EF 65%, circumflex patent, mid RCA 80-90% (small and nondominant), LAD and diagonal stents patent, LAD  at the origin of the first diagonal 30%, ostial D2 90%, mid 80-90% (small vessel).  Continued medical therapy was recommended.   ? Carotid stenosis   ? dopplers 02/2012: 4-69% RICA; 62-95% LICA  ? Diabetes type 2, controlled ([place])   ? Diverticulosis   ? colon  ? ED (erectile dysfunction)   ? Esophageal stricture   ? GERD (gastroesophageal reflux disease)   ? Hemorrhoids   ? Hiatal hernia   ? Hyperlipidemia   ? Hypertension   ? IBS (irritable bowel syndrome)   ? PVC's (premature ventricular contractions) 01/21/2020  ? Monitor 07/2019: PVC burden 21.6% // Echocardiogram 01/2020: EF 65-70, no RWMA, Gr 2 DD, normal RVSF, RVSP 43.3 mmHg (mod pul HTN), mild LAE, mild MR, trivial AI  ? Right inguinal hernia   ? s/p  repair in 8/12  ? SVT (supraventricular tachycardia) ([place])   ? ?Current Medications: ?Current Meds  ?Medication Sig  ? ACCU-CHEK AVIVA PLUS test strip USE FOR TESTING UP TO 4 TIMES DAILY AS DIRECTED  ? amLODipine (NORVASC) 2.5 MG tablet Take 1 tablet (2.5 mg total) by mouth daily.  ? aspirin EC 81 MG tablet Take 81 mg by mouth daily after supper.  ? blood glucose meter kit and supplies Dispense based on patient and insurance preference. Use up to four times daily as directed. (FOR ICD-10 E10.9, E11.9).  ? Blood Glucose Monitoring Suppl (ONE TOUCH ULTRA 2) w/Device KIT Use as directed E 11.9  ? brimonidine (ALPHAGAN) 0.2 % ophthalmic solution Administer 1 drop to both eyes Three (3) times a day.  ? canagliflozin (INVOKANA) 300 MG TABS tablet Take 1 tablet (300 mg total) by mouth daily before breakfast.  ? finasteride (PROSCAR) 5 MG tablet Take 1 tablet (5 mg total) by mouth daily.  ? gabapentin (NEURONTIN) 300 MG capsule TAKE 1 TO 2 CAPSULES BY MOUTH AT BEDTIME FOR RESTLESS LEG AND SLEEP  ? glipiZIDE (GLUCOTROL XL) 5 MG 24 hr tablet TAKE 1 TABLET EVERY DAY WITH BREAKFAST ANNUAL APPT DUE IN JULY MUST SEE PROVIDER FOR FUTURE REFILLS  ? isosorbide mononitrate (IMDUR) 30 MG 24 hr tablet Take 0.5 tablets (15 mg total) by mouth daily.  ? Lancets MISC Use as directed twice daily E11.9  ? latanoprost (XALATAN) 0.005 % ophthalmic solution Place 1 drop into both eyes at bedtime.  ? losartan (COZAAR) 100 MG tablet TAKE 1 TABLET BY MOUTH EVERY DAY  ? meclizine (ANTIVERT) 12.5 MG tablet Take 1 tablet (12.5 mg total) by mouth 3 (three) times daily as needed for dizziness.  ? metFORMIN (GLUCOPHAGE) 500 MG tablet Take 500 mg by mouth 2 (two) times daily with a meal.  ? metoprolol succinate (TOPROL XL) 25 MG 24 hr tablet Take 0.5 tablets (12.5 mg total) by mouth daily.  ? nitroGLYCERIN (NITROSTAT) 0.4 MG SL tablet PLACE 1 TABLET UNDER THE TONGUE EVERY 5 MINUTES AS NEEDED FOR CHEST PAIN  ? pantoprazole (PROTONIX) 40 MG tablet TAKE 1  TABLET BY MOUTH EVERY DAY  ? simvastatin (ZOCOR) 40 MG tablet Take 1 tablet (40 mg total) by mouth at bedtime.  ? [DISCONTINUED] carvedilol (COREG) 3.125 MG tablet Take 1 tablet (3.125 mg total) by mouth 2 (two) times daily with a meal.  ?  ?Allergies:   Crestor [rosuvastatin calcium] and Lovastatin  ? ?Social History  ? ?Tobacco Use  ? Smoking status: Former  ?  Types: Cigarettes  ?  Quit date: 03/05/1956  ?  Years since quitting: 65.9  ? Smokeless tobacco: Never  ?  Tobacco comments:  ?  smoked in teens  ?Vaping Use  ? Vaping Use: Never used  ?Substance Use Topics  ? Alcohol use: No  ?  Alcohol/week: 0.0 standard drinks  ? Drug use: No  ?  ?Family Hx: ?The patient's family history includes Bladder Cancer (age of onset: 80) in his mother; Healthy in his daughter and son; Heart attack (age of onset: 21) in his father; Pancreatic cancer (age of onset: 37) in his sister. There is no history of Colon cancer. ? ?Review of Systems  ?Constitutional: Negative for chills and fever.  ?Respiratory:  Negative for cough.   ?Gastrointestinal:  Negative for hematochezia, nausea and vomiting.  ?Genitourinary:  Negative for hematuria.   ? ?EKGs/Labs/Other Test Reviewed:   ? ?EKG:  EKG is  ordered today.  The ekg ordered today demonstrates sinus bradycardia, HR 50, normal axis, low voltage, nonspecific ST-T wave changes, QTc 423, PVC ? ?Recent Labs: ?03/08/2021: Hemoglobin 15.5; Platelets 190.0 ?08/29/2021: ALT 17; BUN 23; Creatinine, Ser 1.06; Potassium 4.3; Sodium 141  ? ?Recent Lipid Panel ?Recent Labs  ?  08/29/21 ?1345  ?CHOL 124  ?TRIG 189.0*  ?HDL 36.80*  ?VLDL 37.8  ?[place] 49  ?  ? ?Risk Assessment/Calculations:   ?  ?    ?Physical Exam:   ? ?VS:  BP (!) 118/50 (BP Location: Right Arm, Patient Position: Sitting, Cuff Size: Normal)   Pulse (!) 50   Ht '5\' 10"'  (1.778 m)   Wt 157 lb (71.2 kg)   SpO2 98%   BMI 22.53 kg/m?    ? ?Wt Readings from Last 3 Encounters:  ?02/11/22 157 lb (71.2 kg)  ?10/16/21 153 lb (69.4 kg)   ?08/29/21 157 lb (71.2 kg)  ?  ?Constitutional:   ?   Appearance: Healthy appearance. Not in distress.  ?Neck:  ?   Vascular: No JVR. JVD normal.  ?Pulmonary:  ?   Effort: Pulmonary effort is normal.  ?   Breath s

## 2022-02-11 NOTE — Assessment & Plan Note (Signed)
Hx of DES to LAD and D1 in 2010 and POBA to D1 in 2018.  He is having occasional chest pain with exertion.  He also notes L arm pain that does not seem to be related to his chest pain.  He cannot remember what his angina felt like. He notes that he has taken NTG x 2 over the past year with relief.  He also notes shortness of breath with exertion.  His ECG is unchanged.  His symptoms are somewhat concerning for angina.  Given his advanced age, I think we should try to avoid cardiac catheterization if possible (high risk stress test, uncontrolled symptoms, etc).  He does not take PDE-5 inhibitors. ?? Continue aspirin 81 mg daily, amlodipine 2.5 mg daily, simvastatin 40 mg daily ?? Stop carvedilol and start metoprolol succinate 12.5 mg daily as noted ?? Start isosorbide mononitrate 15 mg daily ?? BMET, BNP, CBC ?? Lexiscan Myoview ?? Echocardiogram ?? Follow-up 1 month ?

## 2022-02-12 LAB — BASIC METABOLIC PANEL
BUN/Creatinine Ratio: 18 (ref 10–24)
BUN: 21 mg/dL (ref 8–27)
CO2: 21 mmol/L (ref 20–29)
Calcium: 9.7 mg/dL (ref 8.6–10.2)
Chloride: 106 mmol/L (ref 96–106)
Creatinine, Ser: 1.15 mg/dL (ref 0.76–1.27)
Glucose: 219 mg/dL — ABNORMAL HIGH (ref 70–99)
Potassium: 4.5 mmol/L (ref 3.5–5.2)
Sodium: 143 mmol/L (ref 134–144)
eGFR: 62 mL/min/{1.73_m2} (ref >59)

## 2022-02-12 LAB — CBC
Hematocrit: 44.2 % (ref 37.5–51.0)
Hemoglobin: 14.8 g/dL (ref 13.0–17.7)
MCH: 29.7 pg (ref 26.6–33.0)
MCHC: 33.5 g/dL (ref 31.5–35.7)
MCV: 89 fL (ref 79–97)
Platelets: 188 10*3/uL (ref 150–450)
RBC: 4.98 x10E6/uL (ref 4.14–5.80)
RDW: 12.4 % (ref 11.6–15.4)
WBC: 6.5 10*3/uL (ref 3.4–10.8)

## 2022-02-12 LAB — PRO B NATRIURETIC PEPTIDE: NT-Pro BNP: 393 pg/mL (ref 0–486)

## 2022-02-12 NOTE — Progress Notes (Signed)
Pt has been made aware of normal result and verbalized understanding.  jw  ?He was made aware of his elevated glucose.

## 2022-02-13 DIAGNOSIS — L2084 Intrinsic (allergic) eczema: Secondary | ICD-10-CM | POA: Diagnosis not present

## 2022-02-22 ENCOUNTER — Telehealth (HOSPITAL_COMMUNITY): Payer: Self-pay | Admitting: *Deleted

## 2022-02-22 NOTE — Telephone Encounter (Signed)
Patient given detailed instructions per Myocardial Perfusion Study Information Sheet for the test on 03/01/2022 at 10:30. Patient notified to arrive 15 minutes early and that it is imperative to arrive on time for appointment to keep from having the test rescheduled. ? If you need to cancel or reschedule your appointment, please call the office within 24 hours of your appointment. . Patient verbalized understanding.Glade Lloyd S ? ? ?

## 2022-02-28 ENCOUNTER — Encounter: Payer: Self-pay | Admitting: Internal Medicine

## 2022-02-28 ENCOUNTER — Ambulatory Visit (INDEPENDENT_AMBULATORY_CARE_PROVIDER_SITE_OTHER): Payer: Medicare HMO | Admitting: Internal Medicine

## 2022-02-28 ENCOUNTER — Other Ambulatory Visit: Payer: Self-pay | Admitting: Internal Medicine

## 2022-02-28 VITALS — BP 118/60 | HR 56 | Temp 97.7°F | Ht 70.0 in | Wt 158.0 lb

## 2022-02-28 DIAGNOSIS — Z0001 Encounter for general adult medical examination with abnormal findings: Secondary | ICD-10-CM

## 2022-02-28 DIAGNOSIS — E785 Hyperlipidemia, unspecified: Secondary | ICD-10-CM | POA: Diagnosis not present

## 2022-02-28 DIAGNOSIS — I10 Essential (primary) hypertension: Secondary | ICD-10-CM

## 2022-02-28 DIAGNOSIS — E538 Deficiency of other specified B group vitamins: Secondary | ICD-10-CM

## 2022-02-28 DIAGNOSIS — E559 Vitamin D deficiency, unspecified: Secondary | ICD-10-CM | POA: Diagnosis not present

## 2022-02-28 DIAGNOSIS — E1159 Type 2 diabetes mellitus with other circulatory complications: Secondary | ICD-10-CM

## 2022-02-28 LAB — LIPID PANEL
Cholesterol: 116 mg/dL (ref 0–200)
HDL: 35.4 mg/dL — ABNORMAL LOW (ref >39.00)
LDL Cholesterol: 59 mg/dL (ref 0–99)
NonHDL: 80.73
Total CHOL/HDL Ratio: 3
Triglycerides: 107 mg/dL (ref 0.0–149.0)
VLDL: 21.4 mg/dL (ref 0.0–40.0)

## 2022-02-28 LAB — MICROALBUMIN / CREATININE URINE RATIO
Creatinine,U: 134.6 mg/dL
Microalb Creat Ratio: 2.6 mg/g (ref 0.0–30.0)
Microalb, Ur: 3.5 mg/dL — ABNORMAL HIGH (ref 0.0–1.9)

## 2022-02-28 LAB — URINALYSIS, ROUTINE W REFLEX MICROSCOPIC
Bilirubin Urine: NEGATIVE
Hgb urine dipstick: NEGATIVE
Ketones, ur: NEGATIVE
Leukocytes,Ua: NEGATIVE
Nitrite: NEGATIVE
RBC / HPF: NONE SEEN (ref 0–?)
Specific Gravity, Urine: 1.025 (ref 1.000–1.030)
Total Protein, Urine: NEGATIVE
Urine Glucose: 100 — AB
Urobilinogen, UA: 1 (ref 0.0–1.0)
pH: 5.5 (ref 5.0–8.0)

## 2022-02-28 LAB — HEPATIC FUNCTION PANEL
ALT: 15 U/L (ref 0–53)
AST: 13 U/L (ref 0–37)
Albumin: 4.1 g/dL (ref 3.5–5.2)
Alkaline Phosphatase: 42 U/L (ref 39–117)
Bilirubin, Direct: 0.2 mg/dL (ref 0.0–0.3)
Total Bilirubin: 0.9 mg/dL (ref 0.2–1.2)
Total Protein: 6.2 g/dL (ref 6.0–8.3)

## 2022-02-28 LAB — CBC WITH DIFFERENTIAL/PLATELET
Basophils Absolute: 0 10*3/uL (ref 0.0–0.1)
Basophils Relative: 0.6 % (ref 0.0–3.0)
Eosinophils Absolute: 0.3 10*3/uL (ref 0.0–0.7)
Eosinophils Relative: 4.4 % (ref 0.0–5.0)
HCT: 42.1 % (ref 39.0–52.0)
Hemoglobin: 14.1 g/dL (ref 13.0–17.0)
Lymphocytes Relative: 21.5 % (ref 12.0–46.0)
Lymphs Abs: 1.5 10*3/uL (ref 0.7–4.0)
MCHC: 33.5 g/dL (ref 30.0–36.0)
MCV: 89.7 fl (ref 78.0–100.0)
Monocytes Absolute: 0.6 10*3/uL (ref 0.1–1.0)
Monocytes Relative: 8.8 % (ref 3.0–12.0)
Neutro Abs: 4.4 10*3/uL (ref 1.4–7.7)
Neutrophils Relative %: 64.7 % (ref 43.0–77.0)
Platelets: 171 10*3/uL (ref 150.0–400.0)
RBC: 4.69 Mil/uL (ref 4.22–5.81)
RDW: 13.8 % (ref 11.5–15.5)
WBC: 6.7 10*3/uL (ref 4.0–10.5)

## 2022-02-28 LAB — TSH: TSH: 1.93 u[IU]/mL (ref 0.35–5.50)

## 2022-02-28 LAB — HEMOGLOBIN A1C: Hgb A1c MFr Bld: 8.6 % — ABNORMAL HIGH (ref 4.6–6.5)

## 2022-02-28 LAB — BASIC METABOLIC PANEL
BUN: 20 mg/dL (ref 6–23)
CO2: 28 mEq/L (ref 19–32)
Calcium: 9 mg/dL (ref 8.4–10.5)
Chloride: 105 mEq/L (ref 96–112)
Creatinine, Ser: 0.97 mg/dL (ref 0.40–1.50)
GFR: 69.97 mL/min (ref >60.00)
Glucose, Bld: 151 mg/dL — ABNORMAL HIGH (ref 70–99)
Potassium: 4.4 mEq/L (ref 3.5–5.1)
Sodium: 140 mEq/L (ref 135–145)

## 2022-02-28 LAB — VITAMIN B12: Vitamin B-12: 334 pg/mL (ref 211–911)

## 2022-02-28 LAB — VITAMIN D 25 HYDROXY (VIT D DEFICIENCY, FRACTURES): VITD: 30.36 ng/mL (ref 30.00–100.00)

## 2022-02-28 MED ORDER — METFORMIN HCL ER 500 MG PO TB24: 1000.0000 mg | ORAL_TABLET | Freq: Every day | ORAL | 3 refills | Status: DC

## 2022-02-28 MED ORDER — GABAPENTIN 300 MG PO CAPS: ORAL_CAPSULE | ORAL | 1 refills | Status: DC

## 2022-02-28 NOTE — Progress Notes (Signed)
Patient ID: TIN ENGRAM, male   DOB: 28-Dec-1931, 88 y.o.   MRN: [id number] ? ? ? ?     Chief Complaint:: wellness exam and dm, low vit d, hld, htn ? ?     HPI:  Keith Herrera is a 88 y.o. male here for wellness exam; plans to call for eye exam soon at [facility] soon, had to be rescheduled.  ?         ?              Also for echo and stress test tomorrow.  Invokana too expensive.   Pt denies chest pain, increased sob or doe, wheezing, orthopnea, PND, increased LE swelling, palpitations, dizziness or syncope.   Pt denies polydipsia, polyuria, or new focal neuro s/s.    Pt denies fever, wt loss, night sweats, loss of appetite, or other constitutional symptoms  No other new complaints ?  ?Wt Readings from Last 3 Encounters:  ?03/01/22 157 lb (71.2 kg)  ?02/28/22 158 lb (71.7 kg)  ?02/11/22 157 lb (71.2 kg)  ? ?BP Readings from Last 3 Encounters:  ?02/28/22 118/60  ?02/11/22 (!) 118/50  ?10/16/21 134/72  ? ?Immunization History  ?Administered Date(s) Administered  ? Fluad Quad(high Dose 65+) 07/19/2019, 09/21/2020  ? H1N1 10/28/2008  ? Influenza Whole 11/30/2007, 11/18/2009  ? Influenza, High Dose Seasonal PF 09/21/2013, 10/04/2014, 08/19/2017  ? Influenza-Unspecified 06/21/2016, 08/19/2018  ? Moderna Sars-Covid-2 Vaccination 12/01/2019, 12/29/2019, 11/02/2020  ? Pneumococcal Conjugate-13 10/05/2013  ? Pneumococcal Polysaccharide-23 11/30/2007  ? Td 03/23/1998, 10/28/2008  ? Tdap 11/25/2018  ? ?There are no preventive care reminders to display for this patient. ? ?  ? ?Past Medical History:  ?Diagnosis Date  ? Adenomatous polyp of colon 05/2004  ? Allergic rhinitis   ? Anxiety   ? Aortic insufficiency 03/01/2022  ? Echocardiogram 02/2022: EF 60-65, no RWMA, Gr 2 DD, normal RVSF, mild to mod LAE, mild MR, mild AI, AV sclerosis w/o AS  ? BPH (benign prostatic hyperplasia)   ? CAD (coronary artery disease)   ? s/p Cypher DES to LAD and pD1 2010;  LHC was done 6/12: EF 65%, circumflex patent, mid RCA 80-90% (small and  nondominant), LAD and diagonal stents patent, LAD at the origin of the first diagonal 30%, ostial D2 90%, mid 80-90% (small vessel).  Continued medical therapy was recommended.   ? Carotid stenosis   ? dopplers 02/2012: 1-32% RICA; 44-01% LICA  ? Coronary artery disease involving native coronary artery of native heart with angina pectoris ([place]) 11/02/2008  ? Myoview 02/2022: EF 76, no ischemia or infarction; low risk    ? Diabetes type 2, controlled ([place])   ? Diverticulosis   ? colon  ? ED (erectile dysfunction)   ? Esophageal stricture   ? GERD (gastroesophageal reflux disease)   ? Hemorrhoids   ? Hiatal hernia   ? Hyperlipidemia   ? Hypertension   ? IBS (irritable bowel syndrome)   ? PVC's (premature ventricular contractions) 01/21/2020  ? Monitor 07/2019: PVC burden 21.6% // Echocardiogram 01/2020: EF 65-70, no RWMA, Gr 2 DD, normal RVSF, RVSP 43.3 mmHg (mod pul HTN), mild LAE, mild MR, trivial AI  ? Right inguinal hernia   ? s/p repair in 8/12  ? SVT (supraventricular tachycardia) ([place])   ? ?Past Surgical History:  ?Procedure Laterality Date  ? CARDIAC CATHETERIZATION N/A 12/10/2016  ? Procedure: Left Heart Cath and Coronary Angiography;  Surgeon: Jettie Booze, MD;  Location: [facility];  Service: Cardiovascular;  Laterality: N/A;  ? CARDIAC CATHETERIZATION N/A 12/10/2016  ? Procedure: Coronary Balloon Angioplasty;  Surgeon: Jettie Booze, MD;  Location: [place] [facility];  Service: Cardiovascular;  Laterality: N/A;  ? CARDIAC CATHETERIZATION N/A 12/10/2016  ? Procedure: Intravascular Pressure Wire/FFR Study;  Surgeon: Jettie Booze, MD;  Location: [place] [facility];  Service: Cardiovascular;  Laterality: N/A;  ? CORONARY STENT PLACEMENT  10/2008  ? x 2  ? ELECTROPHYSIOLOGIC STUDY N/A 03/20/2015  ? no inducible SVT - Dr Lovena Le  ? INGUINAL HERNIA REPAIR Right   ? RIH with ultrapro patch  ? ? reports that he quit smoking about 66 years ago. His smoking use included cigarettes. He has never used  smokeless tobacco. He reports that he does not drink alcohol and does not use drugs. ?family history includes Bladder Cancer (age of onset: 43) in his mother; Healthy in his daughter and son; Heart attack (age of onset: 20) in his father; Pancreatic cancer (age of onset: 32) in his sister. ?Allergies  ?Allergen Reactions  ? Crestor [Rosuvastatin Calcium] Rash  ?  All over body  ? Lovastatin Itching and Rash  ? ?Current Outpatient Medications on File Prior to Visit  ?Medication Sig Dispense Refill  ? ACCU-CHEK AVIVA PLUS test strip USE FOR TESTING UP TO 4 TIMES DAILY AS DIRECTED 100 strip 2  ? amLODipine (NORVASC) 2.5 MG tablet Take 1 tablet (2.5 mg total) by mouth daily. 90 tablet 3  ? aspirin EC 81 MG tablet Take 81 mg by mouth daily after supper.    ? blood glucose meter kit and supplies Dispense based on patient and insurance preference. Use up to four times daily as directed. (FOR ICD-10 E10.9, E11.9). 1 each 0  ? Blood Glucose Monitoring Suppl (ONE TOUCH ULTRA 2) w/Device KIT Use as directed E 11.9 1 each 0  ? brimonidine (ALPHAGAN) 0.2 % ophthalmic solution Administer 1 drop to both eyes Three (3) times a day.    ? finasteride (PROSCAR) 5 MG tablet Take 1 tablet (5 mg total) by mouth daily. 90 tablet 3  ? glipiZIDE (GLUCOTROL XL) 5 MG 24 hr tablet TAKE 1 TABLET EVERY DAY WITH BREAKFAST ANNUAL APPT DUE IN JULY MUST SEE PROVIDER FOR FUTURE REFILLS 90 tablet 0  ? isosorbide mononitrate (IMDUR) 30 MG 24 hr tablet Take 0.5 tablets (15 mg total) by mouth daily. 45 tablet 3  ? Lancets MISC Use as directed twice daily E11.9 200 each 3  ? latanoprost (XALATAN) 0.005 % ophthalmic solution Place 1 drop into both eyes at bedtime.  12  ? losartan (COZAAR) 100 MG tablet TAKE 1 TABLET BY MOUTH EVERY DAY 90 tablet 3  ? meclizine (ANTIVERT) 12.5 MG tablet Take 1 tablet (12.5 mg total) by mouth 3 (three) times daily as needed for dizziness. 90 tablet 0  ? metoprolol succinate (TOPROL XL) 25 MG 24 hr tablet Take 0.5 tablets  (12.5 mg total) by mouth daily. 45 tablet 3  ? nitroGLYCERIN (NITROSTAT) 0.4 MG SL tablet PLACE 1 TABLET UNDER THE TONGUE EVERY 5 MINUTES AS NEEDED FOR CHEST PAIN 25 tablet 2  ? pantoprazole (PROTONIX) 40 MG tablet TAKE 1 TABLET BY MOUTH EVERY DAY 90 tablet 2  ? simvastatin (ZOCOR) 40 MG tablet Take 1 tablet (40 mg total) by mouth at bedtime. 90 tablet 3  ? ?No current facility-administered medications on file prior to visit.  ? ?     ROS:  All others reviewed and negative. ? ?Objective  ? ?  PE:  BP 118/60 (BP Location: Left Arm, Patient Position: Sitting, Cuff Size: Large)   Pulse (!) 56   Temp 97.7 ?F (36.5 ?C) (Oral)   Ht '5\' 10"'  (1.778 m)   Wt 158 lb (71.7 kg)   SpO2 97%   BMI 22.67 kg/m?  ? ?              Constitutional: Pt appears in NAD ?              HENT: Head: NCAT.  ?              Right Ear: External ear normal.   ?              Left Ear: External ear normal.  ?              Eyes: . Pupils are equal, round, and reactive to light. Conjunctivae and EOM are normal ?              Nose: without d/c or deformity ?              Neck: Neck supple. Gross normal ROM ?              Cardiovascular: Normal rate and regular rhythm.   ?              Pulmonary/Chest: Effort normal and breath sounds without rales or wheezing.  ?              Abd:  Soft, NT, ND, + BS, no organomegaly ?              Neurological: Pt is alert. At baseline orientation, motor grossly intact ?              Skin: Skin is warm. No rashes, no other new lesions, LE edema - none ?              Psychiatric: Pt behavior is normal without agitation  ? ?Micro: none ? ?Cardiac tracings I have personally interpreted today:  none ? ?Pertinent Radiological findings (summarize): none  ? ?Lab Results  ?Component Value Date  ? WBC 6.7 02/28/2022  ? HGB 14.1 02/28/2022  ? HCT 42.1 02/28/2022  ? PLT 171.0 02/28/2022  ? GLUCOSE 151 (H) 02/28/2022  ? CHOL 116 02/28/2022  ? TRIG 107.0 02/28/2022  ? HDL 35.40 (L) 02/28/2022  ? LDLDIRECT 82.0 05/19/2018  ?  LDLCALC 59 02/28/2022  ? ALT 15 02/28/2022  ? AST 13 02/28/2022  ? NA 140 02/28/2022  ? K 4.4 02/28/2022  ? CL 105 02/28/2022  ? CREATININE 0.97 02/28/2022  ? BUN 20 02/28/2022  ? CO2 28 02/28/2022  ? TSH 1.93 02/28/2022

## 2022-02-28 NOTE — Patient Instructions (Signed)
Please continue all other medications as before, and refills have been done if requested. ? ?Please have the pharmacy call with any other refills you may need. ? ?Please continue your efforts at being more active, low cholesterol diet, and weight control. ? ?You are otherwise up to date with prevention measures today. ? ?Please keep your appointments with your specialists as you may have planned - the 2 tests tomorrow for the heart ? ?Please go to the LAB at the blood drawing area for the tests to be done ? ?You will be contacted by phone if any changes need to be made immediately.  Otherwise, you will receive a letter about your results with an explanation, but please check with MyChart first. ? ?Please remember to sign up for MyChart if you have not done so, as this will be important to you in the future with finding out test results, communicating by private email, and scheduling acute appointments online when needed. ? ?Please make an Appointment to return in 6 months, or sooner if needed ?

## 2022-02-28 NOTE — Assessment & Plan Note (Signed)
Last vitamin D ?Lab Results  ?Component Value Date  ? VD25OH 40.63 08/29/2021  ? ?Stable, cont oral replacement ? ?

## 2022-03-01 ENCOUNTER — Encounter: Payer: Self-pay | Admitting: Physician Assistant

## 2022-03-01 ENCOUNTER — Telehealth: Payer: Self-pay

## 2022-03-01 ENCOUNTER — Ambulatory Visit (HOSPITAL_BASED_OUTPATIENT_CLINIC_OR_DEPARTMENT_OTHER): Payer: Medicare HMO

## 2022-03-01 ENCOUNTER — Ambulatory Visit (HOSPITAL_COMMUNITY): Payer: Medicare HMO | Attending: Internal Medicine

## 2022-03-01 ENCOUNTER — Telehealth: Payer: Self-pay | Admitting: Internal Medicine

## 2022-03-01 DIAGNOSIS — R0602 Shortness of breath: Secondary | ICD-10-CM | POA: Diagnosis not present

## 2022-03-01 DIAGNOSIS — I25118 Atherosclerotic heart disease of native coronary artery with other forms of angina pectoris: Secondary | ICD-10-CM

## 2022-03-01 DIAGNOSIS — R072 Precordial pain: Secondary | ICD-10-CM | POA: Diagnosis not present

## 2022-03-01 DIAGNOSIS — I351 Nonrheumatic aortic (valve) insufficiency: Secondary | ICD-10-CM

## 2022-03-01 HISTORY — DX: Nonrheumatic aortic (valve) insufficiency: I35.1

## 2022-03-01 LAB — MYOCARDIAL PERFUSION IMAGING
LV dias vol: 15 mL (ref 62–150)
LV sys vol: 64 mL
Nuc Stress EF: 76 %
Peak HR: 99 {beats}/min
Rest HR: 58 {beats}/min
Rest Nuclear Isotope Dose: 10.5 mCi
SDS: 5
SRS: 0
SSS: 5
ST Depression (mm): 0 mm
Stress Nuclear Isotope Dose: 32 mCi
TID: 1.17

## 2022-03-01 LAB — ECHOCARDIOGRAM COMPLETE
Area-P 1/2: 3.46 cm2
Height: 70 in
P 1/2 time: 476 msec
S' Lateral: 2.5 cm
Weight: 2512 oz

## 2022-03-01 MED ORDER — TECHNETIUM TC 99M TETROFOSMIN IV KIT
32.0000 | PACK | Freq: Once | INTRAVENOUS | Status: AC | PRN
  Administered 2022-03-01: 32 via INTRAVENOUS
  Filled 2022-03-01: qty 32

## 2022-03-01 MED ORDER — TECHNETIUM TC 99M TETROFOSMIN IV KIT
10.5000 | PACK | Freq: Once | INTRAVENOUS | Status: AC | PRN
  Administered 2022-03-01: 10.5 via INTRAVENOUS
  Filled 2022-03-01: qty 11

## 2022-03-01 MED ORDER — REGADENOSON 0.4 MG/5ML IV SOLN
0.4000 mg | Freq: Once | INTRAVENOUS | Status: AC
  Administered 2022-03-01: 0.4 mg via INTRAVENOUS

## 2022-03-01 MED ORDER — LINAGLIPTIN 5 MG PO TABS: 5.0000 mg | ORAL_TABLET | Freq: Every day | ORAL | 3 refills | Status: DC

## 2022-03-01 NOTE — Telephone Encounter (Signed)
Ok to restart if he is willing - done erx ?

## 2022-03-01 NOTE — Telephone Encounter (Signed)
Pt checking status of 02-28-2022 lab results ? ?Informed pt of providers 02-28-2022 lab result notes ? ?Pt states he has not been taking glipizide due to cost being to expensive but will pay cost to get a1c lowered ? ?Pt states he is already currently taking metformin 2xd 1,000 mg total and inquiring about the difference between the new and old rx ? ?Please call pt ? ?

## 2022-03-01 NOTE — Telephone Encounter (Signed)
Advise pt that the Rx was sent to preferred Pharmacy. ? ?FYI ?

## 2022-03-01 NOTE — Telephone Encounter (Signed)
Pt is requesting a refill on a D/C medication: Pharmacy: ?linagliptin (TRADJENTA) 5 MG TABS tablet  ? ?Pharmacy: ?[id number] [place], [place] - [address]? ?LOV 02/28/22 ?ROV 08/29/22 ?

## 2022-03-06 ENCOUNTER — Encounter: Payer: Self-pay | Admitting: Internal Medicine

## 2022-03-06 NOTE — Assessment & Plan Note (Signed)
Lab Results  ?Component Value Date  ? HGBA1C 8.6 (H) 02/28/2022  ? ?Stable, pt to continue current medical treatment *glucotrol, metformin, tradjenta ? ?

## 2022-03-06 NOTE — Assessment & Plan Note (Signed)
Age and sex appropriate education and counseling updated with regular exercise and diet ?Referrals for preventative services - plans to reschedule the eye appt ?Immunizations addressed - none needed ?Smoking counseling  - none needed ?Evidence for depression or other mood disorder - none significant ?Most recent labs reviewed. ?I have personally reviewed and have noted: ?1) the patient's medical and social history ?2) The patient's current medications and supplements ?3) The patient's height, weight, and BMI have been recorded in the chart ? ?

## 2022-03-06 NOTE — Assessment & Plan Note (Signed)
Lab Results  ?Component Value Date  ? LDLCALC 59 02/28/2022  ? ?Stable, pt to continue current statin zocor ?

## 2022-03-06 NOTE — Assessment & Plan Note (Signed)
BP Readings from Last 3 Encounters:  ?02/28/22 118/60  ?02/11/22 (!) 118/50  ?10/16/21 134/72  ? ?Stable, pt to continue medical treatment norvasc, losartan ? ?

## 2022-03-12 ENCOUNTER — Telehealth: Payer: Self-pay | Admitting: Internal Medicine

## 2022-03-12 NOTE — Telephone Encounter (Signed)
LVM for pt to rtn my call to schedule AWV with [facility]. Please schedule if pt calls the office.  ?

## 2022-04-07 NOTE — Progress Notes (Signed)
Cardiology Office Note:    Date:  04/08/2022   ID:  Keith Herrera, DOB 1932/01/22, MRN [id number]  PCP:  [name], MD  [facility] Providers Cardiologist:  Sherren Mocha, MD Cardiology APP:  Liliane Shi, PA-C  Electrophysiologist:  Cristopher Peru, MD     Referring MD: [name], MD   Chief Complaint:  F/u for CAD    Patient Profile: Coronary artery disease  S/p DES to LAD and D1 in 2010 S/p POBA to D1 in 11/2016 (no stent due to prior PCI) Carotid artery disease Aortic atherosclerosis  PSVT EPS in 2016 >> no inducible SVT PVCs Symptomatic; eval by Dr. Lovena Le in past (tx options limited due to CAD) Monitor 07/2019: PVC burden 21.6% Hypertension  Hyperlipidemia  Diabetes mellitus 2  GERD  Esophageal stricture Prior hx of chest pain of GI origin  Prior CV Studies: GATED SPECT MYO PERF W/LEXISCAN STRESS 1D 03/01/2022 No ischemia or infarction, EF 76; low risk   ECHO COMPLETE WO IMAGING ENHANCING AGENT 03/01/2022 EF 60-65, no RWMA, mild LVH, GRII DD, normal RVSF, mild-moderate LAE, mild MR, mild AI, AV sclerosis without stenosis    Echocardiogram 01/20/20 EF 65-70, no RWMA, Gr 2 DD, normal RVSF, RVSP 43.3 (mod elevated), mild LAE, mild MR, trivial AI   LT 3-14 day monitor 07/2019 1) the basic rhythm is normal sinus with an average HR of 60 bpm 2) there are occasional PAC's with short supraventricular runs, longest 12 beats 3) frequent PVC's with PVC burden of 21.6%, but no ventricular runs 4) no pathologic pauses 5) no atrial fibrillation or flutter   Cardiac catheterization 12/10/16 LAD prox stent patent; D1 ost stent patent then 80; D1 ost 75 LCx irregs RCA mid 100 (L-R collats) PCI: POBA to D1   Carotid US 04/2016 Bilat 1-39; normal subclavians    History of Present Illness:   Keith Herrera is a 88 y.o. male with the above problem list.  He was last seen in March 2023.  He noted some chest and L arm pain as well as shortness of breath.  I had  him undergo stress testing which was normal.   He had an echocardiogram which showed normal EF and just mild AI, mild MR.  BNP was normal.  I placed him on Isosorbide.  He returns for f/u.  He is here alone.  He notes improved symptoms since medication changes were made at last visit.  He has not had any further chest discomfort.  He does get short of breath with some activities.  This is overall stable.  He has not had orthopnea, leg edema.  He has not had syncope.    Past Medical History:  Diagnosis Date   Adenomatous polyp of colon 05/2004   Allergic rhinitis    Anxiety    Aortic insufficiency 03/01/2022   Echocardiogram 02/2022: EF 60-65, no RWMA, Gr 2 DD, normal RVSF, mild to mod LAE, mild MR, mild AI, AV sclerosis w/o AS   BPH (benign prostatic hyperplasia)    CAD (coronary artery disease)    s/p Cypher DES to LAD and pD1 2010;  LHC was done 6/12: EF 65%, circumflex patent, mid RCA 80-90% (small and nondominant), LAD and diagonal stents patent, LAD at the origin of the first diagonal 30%, ostial D2 90%, mid 80-90% (small vessel).  Continued medical therapy was recommended.    Carotid stenosis    dopplers 02/2012: 3-57% RICA; 01-77% LICA   Coronary artery disease involving native  coronary artery of native heart with angina pectoris ([place]) 11/02/2008   Myoview 02/2022: EF 76, no ischemia or infarction; low risk     Diabetes type 2, controlled ([place])    Diverticulosis    colon   ED (erectile dysfunction)    Esophageal stricture    GERD (gastroesophageal reflux disease)    Hemorrhoids    Hiatal hernia    Hyperlipidemia    Hypertension    IBS (irritable bowel syndrome)    PVC's (premature ventricular contractions) 01/21/2020   Monitor 07/2019: PVC burden 21.6% // Echocardiogram 01/2020: EF 65-70, no RWMA, Gr 2 DD, normal RVSF, RVSP 43.3 mmHg (mod pul HTN), mild LAE, mild MR, trivial AI   Right inguinal hernia    s/p repair in 8/12   SVT (supraventricular tachycardia) (HCC)    Current  Medications: Current Meds  Medication Sig   ACCU-CHEK AVIVA PLUS test strip USE FOR TESTING UP TO 4 TIMES DAILY AS DIRECTED   amLODipine (NORVASC) 2.5 MG tablet Take 1 tablet (2.5 mg total) by mouth daily.   aspirin EC 81 MG tablet Take 81 mg by mouth daily after supper.   blood glucose meter kit and supplies Dispense based on patient and insurance preference. Use up to four times daily as directed. (FOR ICD-10 E10.9, E11.9).   Blood Glucose Monitoring Suppl (ONE TOUCH ULTRA 2) w/Device KIT Use as directed E 11.9   brimonidine (ALPHAGAN) 0.2 % ophthalmic solution Administer 1 drop to both eyes Three (3) times a day.   finasteride (PROSCAR) 5 MG tablet Take 1 tablet (5 mg total) by mouth daily.   gabapentin (NEURONTIN) 300 MG capsule TAKE 1 TO 2 CAPSULES BY MOUTH AT BEDTIME FOR RESTLESS LEG AND SLEEP   glipiZIDE (GLUCOTROL XL) 5 MG 24 hr tablet TAKE 1 TABLET EVERY DAY WITH BREAKFAST ANNUAL APPT DUE IN JULY MUST SEE PROVIDER FOR FUTURE REFILLS   isosorbide mononitrate (IMDUR) 30 MG 24 hr tablet Take 0.5 tablets (15 mg total) by mouth daily.   Lancets MISC Use as directed twice daily E11.9   latanoprost (XALATAN) 0.005 % ophthalmic solution Place 1 drop into both eyes at bedtime.   linagliptin (TRADJENTA) 5 MG TABS tablet Take 1 tablet (5 mg total) by mouth daily.   losartan (COZAAR) 100 MG tablet TAKE 1 TABLET BY MOUTH EVERY DAY   meclizine (ANTIVERT) 12.5 MG tablet Take 1 tablet (12.5 mg total) by mouth 3 (three) times daily as needed for dizziness.   metFORMIN (GLUCOPHAGE-XR) 500 MG 24 hr tablet Take 2 tablets (1,000 mg total) by mouth daily with breakfast.   metoprolol succinate (TOPROL XL) 25 MG 24 hr tablet Take 0.5 tablets (12.5 mg total) by mouth daily.   nitroGLYCERIN (NITROSTAT) 0.4 MG SL tablet PLACE 1 TABLET UNDER THE TONGUE EVERY 5 MINUTES AS NEEDED FOR CHEST PAIN   pantoprazole (PROTONIX) 40 MG tablet TAKE 1 TABLET BY MOUTH EVERY DAY   simvastatin (ZOCOR) 40 MG tablet Take 1 tablet  (40 mg total) by mouth at bedtime.    Allergies:   Crestor [rosuvastatin calcium] and Lovastatin   Social History   Tobacco Use   Smoking status: Former    Types: Cigarettes    Quit date: 03/05/1956    Years since quitting: 66.1   Smokeless tobacco: Never   Tobacco comments:    smoked in teens  Vaping Use   Vaping Use: Never used  Substance Use Topics   Alcohol use: No    Alcohol/week: 0.0 standard drinks  Drug use: No    Family Hx: The patient's family history includes Bladder Cancer (age of onset: 37) in his mother; Healthy in his daughter and son; Heart attack (age of onset: 90) in his father; Pancreatic cancer (age of onset: 67) in his sister. There is no history of Colon cancer.  Review of Systems  Gastrointestinal:  Negative for hematochezia.  Genitourinary:  Negative for hematuria.    EKGs/Labs/Other Test Reviewed:    EKG:  EKG is not ordered today.  The ekg ordered today demonstrates n/a  Recent Labs: 02/11/2022: NT-Pro BNP 393 02/28/2022: ALT 15; BUN 20; Creatinine, Ser 0.97; Hemoglobin 14.1; Platelets 171.0; Potassium 4.4; Sodium 140; TSH 1.93   Recent Lipid Panel Recent Labs    02/28/22 1144  CHOL 116  TRIG 107.0  HDL 35.40*  VLDL 21.4  LDLCALC 59     Risk Assessment/Calculations:         Physical Exam:    VS:  BP 130/60   Pulse 64   Ht '5\' 10"'  (1.778 m)   Wt 159 lb 3.2 oz (72.2 kg)   SpO2 98%   BMI 22.84 kg/m     Vitals:   04/08/22 0920 04/08/22 0954  BP: (!) 102/50 130/60     Wt Readings from Last 3 Encounters:  04/08/22 159 lb 3.2 oz (72.2 kg)  03/01/22 157 lb (71.2 kg)  02/28/22 158 lb (71.7 kg)    Constitutional:      Appearance: Healthy appearance. Not in distress.  Neck:     Vascular: No JVR. JVD normal.  Pulmonary:     Effort: Pulmonary effort is normal.     Breath sounds: No wheezing. No rales.  Cardiovascular:     Normal rate. Regular rhythm. Normal S1. Normal S2.      Murmurs: There is no murmur.  Edema:    Peripheral  edema absent.  Abdominal:     Palpations: Abdomen is soft.  Skin:    General: Skin is warm and dry.  Neurological:     General: No focal deficit present.     Mental Status: Alert and oriented to person, place and time.     Cranial Nerves: Cranial nerves are intact.        ASSESSMENT & PLAN:   Aortic insufficiency Mild by recent echocardiogram  Coronary artery disease involving native coronary artery of native heart with angina pectoris ([place]) History of DES to the LAD and D1 in 2010 and balloon angioplasty to D1 in 2018.  He was recently seen with exertional chest discomfort.  I placed him on isosorbide and obtained a follow-up nuclear stress test.  Nuclear stress test was low risk.  His symptoms improved on medical therapy.  He is currently doing well without anginal symptoms.  Continue amlodipine 2.5 mg daily, aspirin 81 mg daily, isosorbide mononitrate 15 mg daily, Toprol-XL 12.5 mg daily, simvastatin 40 mg daily.  Follow-up in 6 months  Essential hypertension Blood pressure well controlled.  Continue amlodipine 2.5 mg daily, isosorbide mononitrate 15 mg daily, losartan 100 mg daily, Toprol-XL 12.5 mg daily.  Hyperlipidemia LDL goal <70 Recent LDL optimal.  Continue simvastatin 40 mg daily.  PVC's (premature ventricular contractions) Continue beta-blocker therapy.  Sinus bradycardia Improved.  Heart rate 64 on auscultation today.           Dispo:  Return in about 6 months (around 10/09/2022) for Routine Follow Up, w/ Dr. Burt Knack, or Richardson Dopp, PA-C.   Medication Adjustments/Labs and Tests Ordered: Current medicines are  reviewed at length with the patient today.  Concerns regarding medicines are outlined above.  Tests Ordered: No orders of the defined types were placed in this encounter.  Medication Changes: No orders of the defined types were placed in this encounter.  Signed, Richardson Dopp, PA-C  04/08/2022 9:55 AM    [facility] [address], [place], [place]  [address] Phone: [phone]; Fax: [phone]

## 2022-04-08 ENCOUNTER — Encounter: Payer: Self-pay | Admitting: Physician Assistant

## 2022-04-08 ENCOUNTER — Ambulatory Visit: Payer: Medicare HMO | Admitting: Physician Assistant

## 2022-04-08 VITALS — BP 130/60 | HR 64 | Ht 70.0 in | Wt 159.2 lb

## 2022-04-08 DIAGNOSIS — I351 Nonrheumatic aortic (valve) insufficiency: Secondary | ICD-10-CM

## 2022-04-08 DIAGNOSIS — I700 Atherosclerosis of aorta: Secondary | ICD-10-CM

## 2022-04-08 DIAGNOSIS — R001 Bradycardia, unspecified: Secondary | ICD-10-CM | POA: Diagnosis not present

## 2022-04-08 DIAGNOSIS — I493 Ventricular premature depolarization: Secondary | ICD-10-CM | POA: Diagnosis not present

## 2022-04-08 DIAGNOSIS — I10 Essential (primary) hypertension: Secondary | ICD-10-CM

## 2022-04-08 DIAGNOSIS — I25119 Atherosclerotic heart disease of native coronary artery with unspecified angina pectoris: Secondary | ICD-10-CM | POA: Diagnosis not present

## 2022-04-08 DIAGNOSIS — E785 Hyperlipidemia, unspecified: Secondary | ICD-10-CM | POA: Diagnosis not present

## 2022-04-08 NOTE — Assessment & Plan Note (Signed)
Improved.  Heart rate 64 on auscultation today.

## 2022-04-08 NOTE — Assessment & Plan Note (Signed)
Mild by recent echocardiogram

## 2022-04-08 NOTE — Assessment & Plan Note (Signed)
History of DES to the LAD and D1 in 2010 and balloon angioplasty to D1 in 2018.  He was recently seen with exertional chest discomfort.  I placed him on isosorbide and obtained a follow-up nuclear stress test.  Nuclear stress test was low risk.  His symptoms improved on medical therapy.  He is currently doing well without anginal symptoms.  Continue amlodipine 2.5 mg daily, aspirin 81 mg daily, isosorbide mononitrate 15 mg daily, Toprol-XL 12.5 mg daily, simvastatin 40 mg daily.  Follow-up in 6 months

## 2022-04-08 NOTE — Assessment & Plan Note (Signed)
Continue beta-blocker therapy.

## 2022-04-08 NOTE — Patient Instructions (Signed)
Medication Instructions:  Your physician recommends that you continue on your current medications as directed. Please refer to the Current Medication list given to you today.  *If you need a refill on your cardiac medications before your next appointment, please call your pharmacy*   Lab Work: None ordered  If you have labs (blood work) drawn today and your tests are completely normal, you will receive your results only by: [place] (if you have MyChart) OR A paper copy in the mail If you have any lab test that is abnormal or we need to change your treatment, we will call you to review the results.   Testing/Procedures: None ordered   Follow-Up: At [facility], you and your health needs are our priority.  As part of our continuing mission to provide you with exceptional heart care, we have created designated Provider Care Teams.  These Care Teams include your primary Cardiologist (physician) and Advanced Practice Providers (APPs -  Physician Assistants and Nurse Practitioners) who all work together to provide you with the care you need, when you need it.  We recommend signing up for the patient portal called "MyChart".  Sign up information is provided on this After Visit Summary.  MyChart is used to connect with patients for Virtual Visits (Telemedicine).  Patients are able to view lab/test results, encounter notes, upcoming appointments, etc.  Non-urgent messages can be sent to your provider as well.   To learn more about what you can do with MyChart, go to [email or web address].    Your next appointment:   10/02/22 arrive at 9:25  The format for your next appointment:   In Person  Provider:   Sherren Mocha, MD     Other Instructions   Important Information About Sugar

## 2022-04-08 NOTE — Assessment & Plan Note (Signed)
Recent LDL optimal.  Continue simvastatin 40 mg daily.

## 2022-04-08 NOTE — Assessment & Plan Note (Signed)
Blood pressure well controlled.  Continue amlodipine 2.5 mg daily, isosorbide mononitrate 15 mg daily, losartan 100 mg daily, Toprol-XL 12.5 mg daily.

## 2022-04-23 DIAGNOSIS — H21233 Degeneration of iris (pigmentary), bilateral: Secondary | ICD-10-CM | POA: Diagnosis not present

## 2022-04-23 DIAGNOSIS — Z961 Presence of intraocular lens: Secondary | ICD-10-CM | POA: Diagnosis not present

## 2022-04-23 DIAGNOSIS — H01006 Unspecified blepharitis left eye, unspecified eyelid: Secondary | ICD-10-CM | POA: Diagnosis not present

## 2022-04-23 DIAGNOSIS — H01003 Unspecified blepharitis right eye, unspecified eyelid: Secondary | ICD-10-CM | POA: Diagnosis not present

## 2022-04-23 DIAGNOSIS — H5319 Other subjective visual disturbances: Secondary | ICD-10-CM | POA: Diagnosis not present

## 2022-04-29 ENCOUNTER — Other Ambulatory Visit: Payer: Self-pay | Admitting: Internal Medicine

## 2022-04-29 NOTE — Telephone Encounter (Signed)
Please refill as per office routine med refill policy (all routine meds to be refilled for 3 mo or monthly (per pt preference) up to one year from last visit, then month to month grace period for 3 mo, then further med refills will have to be denied) ? ?

## 2022-05-02 ENCOUNTER — Other Ambulatory Visit: Payer: Self-pay | Admitting: Internal Medicine

## 2022-05-10 ENCOUNTER — Other Ambulatory Visit: Payer: Self-pay | Admitting: Internal Medicine

## 2022-05-17 ENCOUNTER — Telehealth: Payer: Self-pay

## 2022-05-17 ENCOUNTER — Ambulatory Visit (INDEPENDENT_AMBULATORY_CARE_PROVIDER_SITE_OTHER): Payer: Medicare HMO

## 2022-05-17 DIAGNOSIS — Z0000 Encounter for general adult medical examination without abnormal findings: Secondary | ICD-10-CM | POA: Diagnosis not present

## 2022-05-17 NOTE — Telephone Encounter (Signed)
Patient aware of Dr. Gwynn Burly instructions.

## 2022-05-17 NOTE — Patient Instructions (Signed)
Keith Herrera , Thank you for taking time to come for your Medicare Wellness Visit. I appreciate your ongoing commitment to your health goals. Please review the following plan we discussed and let me know if I can assist you in the future.   Screening recommendations/referrals: Colonoscopy: Discontinued Recommended yearly ophthalmology/optometry visit for glaucoma screening and checkup Recommended yearly dental visit for hygiene and checkup  Vaccinations: Influenza vaccine: 08/2021 Pneumococcal vaccine: 11/30/2007, 10/05/2013 Tdap vaccine: 11/25/2018; due every 10 years Shingles vaccine: never done   Covid-19: 12/01/2019, 12/29/2019, 11/02/2020  Advanced directives: Yes; Please bring a copy of your health care power of attorney and living will to the office at your convenience.  Conditions/risks identified: Yes  Next appointment: Please schedule your next Medicare Wellness Visit with your Nurse Health Advisor in 1 year by calling [phone].  Preventive Care 19 Years and Older, Male Preventive care refers to lifestyle choices and visits with your health care provider that can promote health and wellness. What does preventive care include? A yearly physical exam. This is also called an annual well check. Dental exams once or twice a year. Routine eye exams. Ask your health care provider how often you should have your eyes checked. Personal lifestyle choices, including: Daily care of your teeth and gums. Regular physical activity. Eating a healthy diet. Avoiding tobacco and drug use. Limiting alcohol use. Practicing safe sex. Taking low doses of aspirin every day. Taking vitamin and mineral supplements as recommended by your health care provider. What happens during an annual well check? The services and screenings done by your health care provider during your annual well check will depend on your age, overall health, lifestyle risk factors, and family history of disease. Counseling   Your health care provider may ask you questions about your: Alcohol use. Tobacco use. Drug use. Emotional well-being. Home and relationship well-being. Sexual activity. Eating habits. History of falls. Memory and ability to understand (cognition). Work and work Statistician. Screening  You may have the following tests or measurements: Height, weight, and BMI. Blood pressure. Lipid and cholesterol levels. These may be checked every 5 years, or more frequently if you are over 66 years old. Skin check. Lung cancer screening. You may have this screening every year starting at age 23 if you have a 30-pack-year history of smoking and currently smoke or have quit within the past 15 years. Fecal occult blood test (FOBT) of the stool. You may have this test every year starting at age 88. Flexible sigmoidoscopy or colonoscopy. You may have a sigmoidoscopy every 5 years or a colonoscopy every 10 years starting at age 66. Prostate cancer screening. Recommendations will vary depending on your family history and other risks. Hepatitis C blood test. Hepatitis B blood test. Sexually transmitted disease (STD) testing. Diabetes screening. This is done by checking your blood sugar (glucose) after you have not eaten for a while (fasting). You may have this done every 1-3 years. Abdominal aortic aneurysm (AAA) screening. You may need this if you are a current or former smoker. Osteoporosis. You may be screened starting at age 80 if you are at high risk. Talk with your health care provider about your test results, treatment options, and if necessary, the need for more tests. Vaccines  Your health care provider may recommend certain vaccines, such as: Influenza vaccine. This is recommended every year. Tetanus, diphtheria, and acellular pertussis (Tdap, Td) vaccine. You may need a Td booster every 10 years. Zoster vaccine. You may need this after age 63.  Pneumococcal 13-valent conjugate (PCV13) vaccine.  One dose is recommended after age 8. Pneumococcal polysaccharide (PPSV23) vaccine. One dose is recommended after age 62. Talk to your health care provider about which screenings and vaccines you need and how often you need them. This information is not intended to replace advice given to you by your health care provider. Make sure you discuss any questions you have with your health care provider. Document Released: 12/01/2015 Document Revised: 07/24/2016 Document Reviewed: 09/05/2015 Elsevier Interactive Patient Education  2017 [place] Prevention in the Home Falls can cause injuries. They can happen to people of all ages. There are many things you can do to make your home safe and to help prevent falls. What can I do on the outside of my home? Regularly fix the edges of walkways and driveways and fix any cracks. Remove anything that might make you trip as you walk through a door, such as a raised step or threshold. Trim any bushes or trees on the path to your home. Use bright outdoor lighting. Clear any walking paths of anything that might make someone trip, such as rocks or tools. Regularly check to see if handrails are loose or broken. Make sure that both sides of any steps have handrails. Any raised decks and porches should have guardrails on the edges. Have any leaves, snow, or ice cleared regularly. Use sand or salt on walking paths during winter. Clean up any spills in your garage right away. This includes oil or grease spills. What can I do in the bathroom? Use night lights. Install grab bars by the toilet and in the tub and shower. Do not use towel bars as grab bars. Use non-skid mats or decals in the tub or shower. If you need to sit down in the shower, use a plastic, non-slip stool. Keep the floor dry. Clean up any water that spills on the floor as soon as it happens. Remove soap buildup in the tub or shower regularly. Attach bath mats securely with double-sided  non-slip rug tape. Do not have throw rugs and other things on the floor that can make you trip. What can I do in the bedroom? Use night lights. Make sure that you have a light by your bed that is easy to reach. Do not use any sheets or blankets that are too big for your bed. They should not hang down onto the floor. Have a firm chair that has side arms. You can use this for support while you get dressed. Do not have throw rugs and other things on the floor that can make you trip. What can I do in the kitchen? Clean up any spills right away. Avoid walking on wet floors. Keep items that you use a lot in easy-to-reach places. If you need to reach something above you, use a strong step stool that has a grab bar. Keep electrical cords out of the way. Do not use floor polish or wax that makes floors slippery. If you must use wax, use non-skid floor wax. Do not have throw rugs and other things on the floor that can make you trip. What can I do with my stairs? Do not leave any items on the stairs. Make sure that there are handrails on both sides of the stairs and use them. Fix handrails that are broken or loose. Make sure that handrails are as long as the stairways. Check any carpeting to make sure that it is firmly attached to the stairs. Fix any  carpet that is loose or worn. Avoid having throw rugs at the top or bottom of the stairs. If you do have throw rugs, attach them to the floor with carpet tape. Make sure that you have a light switch at the top of the stairs and the bottom of the stairs. If you do not have them, ask someone to add them for you. What else can I do to help prevent falls? Wear shoes that: Do not have high heels. Have rubber bottoms. Are comfortable and fit you well. Are closed at the toe. Do not wear sandals. If you use a stepladder: Make sure that it is fully opened. Do not climb a closed stepladder. Make sure that both sides of the stepladder are locked into place. Ask  someone to hold it for you, if possible. Clearly mark and make sure that you can see: Any grab bars or handrails. First and last steps. Where the edge of each step is. Use tools that help you move around (mobility aids) if they are needed. These include: Canes. Walkers. Scooters. Crutches. Turn on the lights when you go into a dark area. Replace any light bulbs as soon as they burn out. Set up your furniture so you have a clear path. Avoid moving your furniture around. If any of your floors are uneven, fix them. If there are any pets around you, be aware of where they are. Review your medicines with your doctor. Some medicines can make you feel dizzy. This can increase your chance of falling. Ask your doctor what other things that you can do to help prevent falls. This information is not intended to replace advice given to you by your health care provider. Make sure you discuss any questions you have with your health care provider. Document Released: 08/31/2009 Document Revised: 04/11/2016 Document Reviewed: 12/09/2014 Elsevier Interactive Patient Education  2017 [employer].
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
## 2022-05-17 NOTE — Progress Notes (Signed)
I connected with Keith Herrera today by telephone and verified that I am speaking with the correct person using two identifiers. Location patient: home Location provider: work Persons participating in the virtual visit: patient, provider.   I discussed the limitations, risks, security and privacy concerns of performing an evaluation and management service by telephone and the availability of in person appointments. I also discussed with the patient that there may be a patient responsible charge related to this service. The patient expressed understanding and verbally consented to this telephonic visit.    Interactive audio and video telecommunications were attempted between this provider and patient, however failed, due to patient having technical difficulties OR patient did not have access to video capability.  We continued and completed visit with audio only.  Some vital signs may be absent or patient reported.   Time Spent with patient on telephone encounter: 30 minutes  Subjective:   Keith Herrera is a 88 y.o. male who presents for Medicare Annual/Subsequent preventive examination.  Review of Systems     Cardiac Risk Factors include: advanced age (>75mn, >>86women);dyslipidemia;hypertension;family history of premature cardiovascular disease;male gender     Objective:    Today's Vitals   05/17/22 1405  PainSc: 7    There is no height or weight on file to calculate BMI.     05/17/2022    2:09 PM 03/08/2021    1:47 PM 12/08/2016   10:35 PM 12/08/2016    5:08 PM 12/27/2015   10:25 AM 03/20/2015    9:59 AM 03/05/2015   11:06 PM  Advanced Directives  Does Patient Have a Medical Advance Directive? Yes Yes No No Yes No No  Type of Advance Directive Living will;Healthcare Power of AToftreesLiving will       Does patient want to make changes to medical advance directive? No - Patient declined No - Patient declined       Copy of [place] Chart? No - copy requested No - copy requested       Would patient like information on creating a medical advance directive?   No - Patient declined No - Patient declined  No - patient declined information No - patient declined information    Current Medications (verified) Outpatient Encounter Medications as of 05/17/2022  Medication Sig   ACCU-CHEK AVIVA PLUS test strip USE FOR TESTING UP TO 4 TIMES DAILY AS DIRECTED   amLODipine (NORVASC) 2.5 MG tablet Take 1 tablet (2.5 mg total) by mouth daily.   aspirin EC 81 MG tablet Take 81 mg by mouth daily after supper.   blood glucose meter kit and supplies Dispense based on patient and insurance preference. Use up to four times daily as directed. (FOR ICD-10 E10.9, E11.9).   Blood Glucose Monitoring Suppl (ONE TOUCH ULTRA 2) w/Device KIT Use as directed E 11.9   brimonidine (ALPHAGAN) 0.2 % ophthalmic solution Administer 1 drop to both eyes Three (3) times a day.   finasteride (PROSCAR) 5 MG tablet Take 1 tablet (5 mg total) by mouth daily.   gabapentin (NEURONTIN) 300 MG capsule TAKE 1 TO 2 CAPSULES BY MOUTH AT BEDTIME FOR RESTLESS LEG AND SLEEP   glipiZIDE (GLUCOTROL XL) 5 MG 24 hr tablet TAKE 1 TABLET EVERY DAY WITH BREAKFAST ANNUAL APPT DUE IN JULY MUST SEE PROVIDER FOR FUTURE REFILLS   isosorbide mononitrate (IMDUR) 30 MG 24 hr tablet Take 0.5 tablets (15 mg total) by mouth daily.   Lancets MISC  Use as directed twice daily E11.9   latanoprost (XALATAN) 0.005 % ophthalmic solution Place 1 drop into both eyes at bedtime.   linagliptin (TRADJENTA) 5 MG TABS tablet Take 1 tablet (5 mg total) by mouth daily.   losartan (COZAAR) 100 MG tablet TAKE 1 TABLET BY MOUTH EVERY DAY   meclizine (ANTIVERT) 12.5 MG tablet TAKE 1 TABLET BY MOUTH 3 TIMES DAILY AS NEEDED FOR DIZZINESS.   metFORMIN (GLUCOPHAGE-XR) 500 MG 24 hr tablet Take 2 tablets (1,000 mg total) by mouth daily with breakfast.   metoprolol succinate (TOPROL XL) 25 MG 24 hr tablet  Take 0.5 tablets (12.5 mg total) by mouth daily.   nitroGLYCERIN (NITROSTAT) 0.4 MG SL tablet PLACE 1 TABLET UNDER THE TONGUE EVERY 5 MINUTES AS NEEDED FOR CHEST PAIN   pantoprazole (PROTONIX) 40 MG tablet TAKE 1 TABLET BY MOUTH EVERY DAY   simvastatin (ZOCOR) 40 MG tablet TAKE 1 TABLET BY MOUTH EVERYDAY AT BEDTIME   No facility-administered encounter medications on file as of 05/17/2022.    Allergies (verified) Crestor [rosuvastatin calcium] and Lovastatin   History: Past Medical History:  Diagnosis Date   Adenomatous polyp of colon 05/2004   Allergic rhinitis    Anxiety    Aortic insufficiency 03/01/2022   Echocardiogram 02/2022: EF 60-65, no RWMA, Gr 2 DD, normal RVSF, mild to mod LAE, mild MR, mild AI, AV sclerosis w/o AS   BPH (benign prostatic hyperplasia)    CAD (coronary artery disease)    s/p Cypher DES to LAD and pD1 2010;  LHC was done 6/12: EF 65%, circumflex patent, mid RCA 80-90% (small and nondominant), LAD and diagonal stents patent, LAD at the origin of the first diagonal 30%, ostial D2 90%, mid 80-90% (small vessel).  Continued medical therapy was recommended.    Carotid stenosis    dopplers 02/2012: 4-16% RICA; 38-45% LICA   Coronary artery disease involving native coronary artery of native heart with angina pectoris ([place]) 11/02/2008   Myoview 02/2022: EF 76, no ischemia or infarction; low risk     Diabetes type 2, controlled ([place])    Diverticulosis    colon   ED (erectile dysfunction)    Esophageal stricture    GERD (gastroesophageal reflux disease)    Hemorrhoids    Hiatal hernia    Hyperlipidemia    Hypertension    IBS (irritable bowel syndrome)    PVC's (premature ventricular contractions) 01/21/2020   Monitor 07/2019: PVC burden 21.6% // Echocardiogram 01/2020: EF 65-70, no RWMA, Gr 2 DD, normal RVSF, RVSP 43.3 mmHg (mod pul HTN), mild LAE, mild MR, trivial AI   Right inguinal hernia    s/p repair in 8/12   SVT (supraventricular tachycardia) ([place])    Past  Surgical History:  Procedure Laterality Date   CARDIAC CATHETERIZATION N/A 12/10/2016   Procedure: Left Heart Cath and Coronary Angiography;  Surgeon: Jettie Booze, MD;  Location: [facility];  Service: Cardiovascular;  Laterality: N/A;   CARDIAC CATHETERIZATION N/A 12/10/2016   Procedure: Coronary Balloon Angioplasty;  Surgeon: Jettie Booze, MD;  Location: [facility];  Service: Cardiovascular;  Laterality: N/A;   CARDIAC CATHETERIZATION N/A 12/10/2016   Procedure: Intravascular Pressure Wire/FFR Study;  Surgeon: Jettie Booze, MD;  Location: [facility];  Service: Cardiovascular;  Laterality: N/A;   CORONARY STENT PLACEMENT  10/2008   x 2   ELECTROPHYSIOLOGIC STUDY N/A 03/20/2015   no inducible SVT - Dr Irish Elders HERNIA REPAIR Right  RIH with ultrapro patch   Family History  Problem Relation Age of Onset   Bladder Cancer Mother 73   Heart attack Father 77       Deceased   Pancreatic cancer Sister 58   Healthy Son    Healthy Daughter    Colon cancer Neg Hx    Social History   Socioeconomic History   Marital status: Married    Spouse name: Not on file   Number of children: 3   Years of education: Not on file   Highest education level: Not on file  Occupational History   Occupation: Recruitment consultant for [employer]    Employer: RETIRED  Tobacco Use   Smoking status: Former    Types: Cigarettes    Quit date: 03/05/1956    Years since quitting: 66.2   Smokeless tobacco: Never   Tobacco comments:    smoked in teens  Vaping Use   Vaping Use: Never used  Substance and Sexual Activity   Alcohol use: No    Alcohol/week: 0.0 standard drinks of alcohol   Drug use: No   Sexual activity: Not on file  Other Topics Concern   Not on file  Social History Narrative   Lives with wife in a 3 story home.  Has 2 living children.  1 passed away in a car accident.     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     Social Determinants of Health   Financial Resource Strain: Low Risk  (05/17/2022)   Overall Financial Resource Strain (CARDIA)    Difficulty of Paying Living Expenses: Not hard at all  Food Insecurity: No Food Insecurity (05/17/2022)   Hunger Vital Sign    Worried About Running Out of Food in the Last Year: Never true    Ran Out of Food in the Last Year: Never true  Transportation Needs: No Transportation Needs (05/17/2022)   PRAPARE - Hydrologist (Medical): No    Lack of Transportation (Non-Medical): No  Physical Activity: Sufficiently Active (05/17/2022)   Exercise Vital Sign    Days of Exercise per Week: 5 days    Minutes of Exercise per Session: 30 min  Stress: No Stress Concern Present (05/17/2022)   [place]    Feeling of Stress : Not at all  Social Connections: [place] (05/17/2022)   Social Connection and Isolation Panel [NHANES]    Frequency of Communication with Friends and Family: More than three times a week    Frequency of Social Gatherings with Friends and Family: More than three times a week    Attends Religious Services: More than 4 times per year    Active Member of [employer] or Organizations: Yes    Attends Music therapist: More than 4 times per year    Marital Status: Married    Tobacco Counseling Counseling given: Not Answered Tobacco comments: smoked in teens   Clinical Intake:  Pre-visit preparation completed: Yes  Pain : 0-10 Pain Score: 7  Pain Type: Acute pain Pain Location: Shoulder Pain Orientation: Right Pain Radiating Towards: none Pain Descriptors / Indicators: Aching, Discomfort, Tender Pain Onset: In the past 7 days Pain Frequency: Constant Pain Relieving Factors: nothing  Pain Relieving Factors: nothing  BMI - recorded: 22.84 Nutritional Status: BMI of 19-24  Normal Nutritional Risks:  None Diabetes: No  How often do  you need to have someone help you when you read instructions, pamphlets, or other written materials from your doctor or pharmacy?: 1 - Never What is the last grade level you completed in school?: HSG; Electrical Contracting License  Diabetic? no  Interpreter Needed?: No  Information entered by :: Lisette Abu, LPN.   Activities of Daily Living    05/17/2022    2:26 PM  In your present state of health, do you have any difficulty performing the following activities:  Hearing? 1  Vision? 0  Difficulty concentrating or making decisions? 0  Walking or climbing stairs? 0  Dressing or bathing? 0  Doing errands, shopping? 0  Preparing Food and eating ? N  Using the Toilet? N  In the past six months, have you accidently leaked urine? N  Do you have problems with loss of bowel control? N  Managing your Medications? N  Managing your Finances? N  Housekeeping or managing your Housekeeping? N    Patient Care Team: [name], MD as PCP - Cyndia Diver, MD as PCP - Cardiology (Cardiology) Evans Lance, MD as PCP - Electrophysiology (Cardiology) Sharmon Revere as Physician Assistant (Cardiology)  Indicate any recent Medical Services you may have received from other than [facility] providers in the past year (date may be approximate).     Assessment:   This is a routine wellness examination for [place].  Hearing/Vision screen Hearing Screening - Comments:: Patient has hearing difficulty and wears hearing aids. Vision Screening - Comments:: Patient does wear corrective lenses/contacts.  [facility] exam done by: [facility]   Dietary issues and exercise activities discussed: Current Exercise Habits: Home exercise routine, Type of exercise: walking, Time (Minutes): 30, Frequency (Times/Week): 5, Weekly Exercise (Minutes/Week): 150, Intensity: Moderate, Exercise limited by: orthopedic condition(s)   Goals Addressed             This  Visit's Progress    Patient declined health goal at this time.        Depression Screen    05/17/2022    2:13 PM 02/28/2022   11:19 AM 02/28/2022   10:50 AM 08/29/2021    1:04 PM 03/08/2021    1:46 PM 03/08/2021    1:44 PM 11/29/2020    1:26 PM  PHQ 2/9 Scores  PHQ - 2 Score 0 0 0 0 0 0 0    Fall Risk    05/17/2022    2:11 PM 02/28/2022   11:19 AM 02/28/2022   10:50 AM 05/29/2021    1:32 PM 03/08/2021    1:47 PM  Fall Risk   Falls in the past year? 1 0 1 0 1  Number falls in past yr: 0 0 0 0 0  Injury with Fall? 0 0 0 0 1  Risk for fall due to : No Fall Risks      Follow up Falls evaluation completed        FALL RISK PREVENTION PERTAINING TO THE HOME:  Any stairs in or around the home? Yes  If so, are there any without handrails? No  Home free of loose throw rugs in walkways, pet beds, electrical cords, etc? Yes  Adequate lighting in your home to reduce risk of falls? Yes   ASSISTIVE DEVICES UTILIZED TO PREVENT FALLS:  Life alert? No  Use of a cane, walker or w/c? No  Grab bars in the bathroom? Yes  Shower chair or bench in shower? Yes  Elevated toilet seat or a handicapped  toilet? Yes   TIMED UP AND GO:  Was the test performed? No .  Length of time to ambulate 10 feet: n/a sec.   Appearance of gait: Patient not evaluated for gait during this visit.  Cognitive Function:        05/17/2022    2:26 PM  6CIT Screen  What Year? 0 points  What month? 0 points  What time? 0 points  Count back from 20 0 points  Months in reverse 0 points  Repeat phrase 0 points  Total Score 0 points    Immunizations Immunization History  Administered Date(s) Administered   Fluad Quad(high Dose 65+) 07/19/2019, 09/21/2020   H1N1 10/28/2008   Influenza Whole 11/30/2007, 11/18/2009   Influenza, High Dose Seasonal PF 09/21/2013, 10/04/2014, 08/19/2017   Influenza-Unspecified 06/21/2016, 08/19/2018   Moderna Sars-Covid-2 Vaccination 12/01/2019, 12/29/2019, 11/02/2020    Pneumococcal Conjugate-13 10/05/2013   Pneumococcal Polysaccharide-23 11/30/2007   Td 03/23/1998, 10/28/2008   Tdap 11/25/2018    TDAP status: Up to date  Flu Vaccine status: Up to date  Pneumococcal vaccine status: Up to date  Covid-19 vaccine status: Completed vaccines  Qualifies for Shingles Vaccine? Yes   Zostavax completed No   Shingrix Completed?: No.    Education has been provided regarding the importance of this vaccine. Patient has been advised to call insurance company to determine out of pocket expense if they have not yet received this vaccine. Advised may also receive vaccine at local pharmacy or Health Dept. Verbalized acceptance and understanding.  Screening Tests Health Maintenance  Topic Date Due   COVID-19 Vaccine (4 - Moderna series) 12/28/2020   OPHTHALMOLOGY EXAM  01/29/2022   Zoster Vaccines- Shingrix (1 of 2) 05/30/2022 (Originally 03/15/1982)   INFLUENZA VACCINE  06/18/2022   FOOT EXAM  03/01/2023   TETANUS/TDAP  11/25/2028   Pneumonia Vaccine 41+ Years old  Completed   HPV VACCINES  Aged Out    Health Maintenance  Health Maintenance Due  Topic Date Due   COVID-19 Vaccine (4 - Moderna series) 12/28/2020   OPHTHALMOLOGY EXAM  01/29/2022    Colorectal cancer screening: No longer required.   Lung Cancer Screening: (Low Dose CT Chest recommended if Age 61-80 years, 30 pack-year currently smoking OR have quit w/in 15years.) does not qualify.   Lung Cancer Screening Referral: no  Additional Screening:  Hepatitis C Screening: does not qualify; Completed no  Vision Screening: Recommended annual ophthalmology exams for early detection of glaucoma and other disorders of the eye. Is the patient up to date with their annual eye exam?  Yes  Who is the provider or what is the name of the office in which the patient attends annual eye exams? [facility] (Dr. Lindalou Hose) If pt is not established with a provider, would they like to be referred to a provider  to establish care? No .   Dental Screening: Recommended annual dental exams for proper oral hygiene  Community Resource Referral / Chronic Care Management: CRR required this visit?  No   CCM required this visit?  No      Plan:     I have personally reviewed and noted the following in the patient's chart:   Medical and social history Use of alcohol, tobacco or illicit drugs  Current medications and supplements including opioid prescriptions. Patient is not currently taking opioid prescriptions. Functional ability and status Nutritional status Physical activity Advanced directives List of other physicians Hospitalizations, surgeries, and ER visits in previous 12 months Vitals Screenings to  include cognitive, depression, and falls Referrals and appointments  In addition, I have reviewed and discussed with patient certain preventive protocols, quality metrics, and best practice recommendations. A written personalized care plan for preventive services as well as general preventive health recommendations were provided to patient.     Sheral Flow, LPN   3/39/1792   Nurse Notes:  There were no vitals filed for this visit. There is no height or weight on file to calculate BMI. Patient stated that he has no issues with gait or balance; does not use any assistive devices.

## 2022-05-22 ENCOUNTER — Ambulatory Visit (INDEPENDENT_AMBULATORY_CARE_PROVIDER_SITE_OTHER): Payer: Medicare HMO | Admitting: Internal Medicine

## 2022-05-22 ENCOUNTER — Encounter: Payer: Self-pay | Admitting: Internal Medicine

## 2022-05-22 VITALS — Ht 70.0 in | Wt 159.8 lb

## 2022-05-22 DIAGNOSIS — E1159 Type 2 diabetes mellitus with other circulatory complications: Secondary | ICD-10-CM

## 2022-05-22 DIAGNOSIS — R42 Dizziness and giddiness: Secondary | ICD-10-CM | POA: Diagnosis not present

## 2022-05-22 DIAGNOSIS — E559 Vitamin D deficiency, unspecified: Secondary | ICD-10-CM

## 2022-05-22 DIAGNOSIS — G2581 Restless legs syndrome: Secondary | ICD-10-CM

## 2022-05-22 DIAGNOSIS — L500 Allergic urticaria: Secondary | ICD-10-CM | POA: Diagnosis not present

## 2022-05-22 DIAGNOSIS — L301 Dyshidrosis [pompholyx]: Secondary | ICD-10-CM

## 2022-05-22 MED ORDER — TRIAMCINOLONE ACETONIDE 0.1 % EX CREA: 1.0000 | TOPICAL_CREAM | Freq: Two times a day (BID) | CUTANEOUS | 2 refills | Status: DC

## 2022-05-22 MED ORDER — CLONAZEPAM 1 MG PO TABS: 1.0000 mg | ORAL_TABLET | Freq: Every evening | ORAL | 5 refills | Status: DC | PRN

## 2022-05-22 MED ORDER — PREDNISONE 10 MG PO TABS: ORAL_TABLET | ORAL | 0 refills | Status: DC

## 2022-05-22 MED ORDER — METHYLPREDNISOLONE ACETATE 80 MG/ML IJ SUSP
80.0000 mg | Freq: Once | INTRAMUSCULAR | Status: AC
  Administered 2022-05-22: 80 mg via INTRAMUSCULAR

## 2022-05-22 MED ORDER — ONDANSETRON 4 MG PO TBDP: 4.0000 mg | ORAL_TABLET | Freq: Three times a day (TID) | ORAL | 1 refills | Status: DC | PRN

## 2022-05-22 NOTE — Assessment & Plan Note (Signed)
C/w likely vertigo - for refill antivert prn and zofran oct prn

## 2022-05-22 NOTE — Assessment & Plan Note (Signed)
Stable by hx, declines lab today

## 2022-05-22 NOTE — Assessment & Plan Note (Addendum)
With seasonal flare likely, for depomedrol 80 mg IM x 1, and short course prednisone low dose

## 2022-05-22 NOTE — Progress Notes (Signed)
Patient ID: Keith Herrera, male   DOB: July 18, 1932, 88 y.o.   MRN: [id number]        Chief Complaint: follow up itching, but also dizziness       HPI:  Keith Herrera is a 88 y.o. male with 1 wk worsening itching mostly to the bilateral palms with rash, but also no rash but itching to the mid upper chest and legs; was napping prior to coming here, but jumped up from lying down when wife woke him up to come to his appt, but had immediate dizziness and spinning with nausea persistent, no vomiting and has felt terrible for the past hour while this is going on.  Also the gabapentin is just not working well any further for the RLS as well as sleep and asks for change in tx.  Pt denies chest pain, increased sob or doe, wheezing, orthopnea, PND, increased LE swelling, palpitations, or syncope.   Pt denies polydipsia, polyuria, or new focal neuro s/s.   Does not want lab today, just feels too bad         Wt Readings from Last 3 Encounters:  05/22/22 159 lb 12.8 oz (72.5 kg)  04/08/22 159 lb 3.2 oz (72.2 kg)  03/01/22 157 lb (71.2 kg)   BP Readings from Last 3 Encounters:  04/08/22 130/60  02/28/22 118/60  02/11/22 (!) 118/50         Past Medical History:  Diagnosis Date   Adenomatous polyp of colon 05/2004   Allergic rhinitis    Anxiety    Aortic insufficiency 03/01/2022   Echocardiogram 02/2022: EF 60-65, no RWMA, Gr 2 DD, normal RVSF, mild to mod LAE, mild MR, mild AI, AV sclerosis w/o AS   BPH (benign prostatic hyperplasia)    CAD (coronary artery disease)    s/p Cypher DES to LAD and pD1 2010;  LHC was done 6/12: EF 65%, circumflex patent, mid RCA 80-90% (small and nondominant), LAD and diagonal stents patent, LAD at the origin of the first diagonal 30%, ostial D2 90%, mid 80-90% (small vessel).  Continued medical therapy was recommended.    Carotid stenosis    dopplers 02/2012: 9-35% RICA; 70-17% LICA   Coronary artery disease involving native coronary artery of native heart with angina  pectoris ([place]) 11/02/2008   Myoview 02/2022: EF 76, no ischemia or infarction; low risk     Diabetes type 2, controlled ([place])    Diverticulosis    colon   ED (erectile dysfunction)    Esophageal stricture    GERD (gastroesophageal reflux disease)    Hemorrhoids    Hiatal hernia    Hyperlipidemia    Hypertension    IBS (irritable bowel syndrome)    PVC's (premature ventricular contractions) 01/21/2020   Monitor 07/2019: PVC burden 21.6% // Echocardiogram 01/2020: EF 65-70, no RWMA, Gr 2 DD, normal RVSF, RVSP 43.3 mmHg (mod pul HTN), mild LAE, mild MR, trivial AI   Right inguinal hernia    s/p repair in 8/12   SVT (supraventricular tachycardia) ([place])    Past Surgical History:  Procedure Laterality Date   CARDIAC CATHETERIZATION N/A 12/10/2016   Procedure: Left Heart Cath and Coronary Angiography;  Surgeon: Jettie Booze, MD;  Location: [facility];  Service: Cardiovascular;  Laterality: N/A;   CARDIAC CATHETERIZATION N/A 12/10/2016   Procedure: Coronary Balloon Angioplasty;  Surgeon: Jettie Booze, MD;  Location: [facility];  Service: Cardiovascular;  Laterality: N/A;   CARDIAC CATHETERIZATION N/A 12/10/2016  Procedure: Intravascular Pressure Wire/FFR Study;  Surgeon: Jettie Booze, MD;  Location: [facility];  Service: Cardiovascular;  Laterality: N/A;   CORONARY STENT PLACEMENT  10/2008   x 2   ELECTROPHYSIOLOGIC STUDY N/A 03/20/2015   no inducible SVT - Dr Irish Elders HERNIA REPAIR Right    RIH with ultrapro patch    reports that he quit smoking about 66 years ago. His smoking use included cigarettes. He has never used smokeless tobacco. He reports that he does not drink alcohol and does not use drugs. family history includes Bladder Cancer (age of onset: 45) in his mother; Healthy in his daughter and son; Heart attack (age of onset: 38) in his father; Pancreatic cancer (age of onset: 68) in his sister. Allergies  Allergen Reactions    Crestor [Rosuvastatin Calcium] Rash    All over body   Lovastatin Itching and Rash   Current Outpatient Medications on File Prior to Visit  Medication Sig Dispense Refill   ACCU-CHEK AVIVA PLUS test strip USE FOR TESTING UP TO 4 TIMES DAILY AS DIRECTED 100 strip 2   amLODipine (NORVASC) 2.5 MG tablet Take 1 tablet (2.5 mg total) by mouth daily. 90 tablet 3   aspirin EC 81 MG tablet Take 81 mg by mouth daily after supper.     blood glucose meter kit and supplies Dispense based on patient and insurance preference. Use up to four times daily as directed. (FOR ICD-10 E10.9, E11.9). 1 each 0   Blood Glucose Monitoring Suppl (ONE TOUCH ULTRA 2) w/Device KIT Use as directed E 11.9 1 each 0   brimonidine (ALPHAGAN) 0.2 % ophthalmic solution Administer 1 drop to both eyes Three (3) times a day.     finasteride (PROSCAR) 5 MG tablet Take 1 tablet (5 mg total) by mouth daily. 90 tablet 3   glipiZIDE (GLUCOTROL XL) 5 MG 24 hr tablet TAKE 1 TABLET EVERY DAY WITH BREAKFAST ANNUAL APPT DUE IN JULY MUST SEE PROVIDER FOR FUTURE REFILLS 90 tablet 0   isosorbide mononitrate (IMDUR) 30 MG 24 hr tablet Take 0.5 tablets (15 mg total) by mouth daily. 45 tablet 3   Lancets MISC Use as directed twice daily E11.9 200 each 3   latanoprost (XALATAN) 0.005 % ophthalmic solution Place 1 drop into both eyes at bedtime.  12   linagliptin (TRADJENTA) 5 MG TABS tablet Take 1 tablet (5 mg total) by mouth daily. 90 tablet 3   losartan (COZAAR) 100 MG tablet TAKE 1 TABLET BY MOUTH EVERY DAY 90 tablet 3   meclizine (ANTIVERT) 12.5 MG tablet TAKE 1 TABLET BY MOUTH 3 TIMES DAILY AS NEEDED FOR DIZZINESS. 90 tablet 0   metFORMIN (GLUCOPHAGE-XR) 500 MG 24 hr tablet Take 2 tablets (1,000 mg total) by mouth daily with breakfast. 180 tablet 3   metoprolol succinate (TOPROL XL) 25 MG 24 hr tablet Take 0.5 tablets (12.5 mg total) by mouth daily. 45 tablet 3   nitroGLYCERIN (NITROSTAT) 0.4 MG SL tablet PLACE 1 TABLET UNDER THE TONGUE EVERY 5  MINUTES AS NEEDED FOR CHEST PAIN 25 tablet 2   pantoprazole (PROTONIX) 40 MG tablet TAKE 1 TABLET BY MOUTH EVERY DAY 90 tablet 2   simvastatin (ZOCOR) 40 MG tablet TAKE 1 TABLET BY MOUTH EVERYDAY AT BEDTIME 90 tablet 3   No current facility-administered medications on file prior to visit.        ROS:  All others reviewed and negative.  Objective  PE:  Ht _0  (1.778 m)   Wt 159 lb 12.8 oz (72.5 kg)   BMI 22.93 kg/m                 Constitutional: Pt appears in NAD; BP not orthostatic on challenge  - lying 150/62, 53   sitting 153/60  53,  standing 164/58  55               HENT: Head: NCAT.                Right Ear: External ear normal.                 Left Ear: External ear normal.                Eyes: . Pupils are equal, round, and reactive to light. Conjunctivae and EOM are normal               Nose: without d/c or deformity               Neck: Neck supple. Gross normal ROM               Cardiovascular: Normal rate and regular rhythm.                 Pulmonary/Chest: Effort normal and breath sounds without rales or wheezing.                Abd:  Soft, NT, ND, + BS, no organomegaly               Neurological: Pt is alert. At baseline orientation, motor grossly intact, cn 2-12 intct               Skin: Skin is warm. + dyshidrotic eczema rashes large to palms,, no other new lesions, LE edema - none               Psychiatric: Pt behavior is normal without agitation   Micro: none  Cardiac tracings I have personally interpreted today:  none  Pertinent Radiological findings (summarize): none   Lab Results  Component Value Date   WBC 6.7 02/28/2022   HGB 14.1 02/28/2022   HCT 42.1 02/28/2022   PLT 171.0 02/28/2022   GLUCOSE 151 (H) 02/28/2022   CHOL 116 02/28/2022   TRIG 107.0 02/28/2022   HDL 35.40 (L) 02/28/2022   LDLDIRECT 82.0 05/19/2018   LDLCALC 59 02/28/2022   ALT 15 02/28/2022   AST 13 02/28/2022   NA 140 02/28/2022   K 4.4 02/28/2022   CL 105 02/28/2022    CREATININE 0.97 02/28/2022   BUN 20 02/28/2022   CO2 28 02/28/2022   TSH 1.93 02/28/2022   PSA 2.49 12/21/2012   INR 1.15 12/10/2016   HGBA1C 8.6 (H) 02/28/2022   MICROALBUR 3.5 (H) 02/28/2022   Assessment/Plan:  Keith Herrera is a 88 y.o. White or Caucasian [1] male with  has a past medical history of Adenomatous polyp of colon (05/2004), Allergic rhinitis, Anxiety, Aortic insufficiency (03/01/2022), BPH (benign prostatic hyperplasia), CAD (coronary artery disease), Carotid stenosis, Coronary artery disease involving native coronary artery of native heart with angina pectoris ([place]) (11/02/2008), Diabetes type 2, controlled ([place]), Diverticulosis, ED (erectile dysfunction), Esophageal stricture, GERD (gastroesophageal reflux disease), Hemorrhoids, Hiatal hernia, Hyperlipidemia, Hypertension, IBS (irritable bowel syndrome), PVC's (premature ventricular contractions) (01/21/2020), Right inguinal hernia, and SVT (supraventricular tachycardia) ([place]).  Dizziness C/w likely vertigo - for refill antivert prn and zofran oct prn  Allergic urticaria With seasonal flare likely, for depomedrol 80 mg IM x 1, and short course prednisone low dose  RLS (restless legs syndrome) Ok for change gabapentin to klonopin 1 mg qhs  Type 2 diabetes mellitus with vascular disease ([place]) Stable by hx, declines lab today  Vitamin D deficiency Last vitamin D Lab Results  Component Value Date   VD25OH 30.36 02/28/2022   Low, to start oral replacement   Dyshidrotic eczema Also for triam cr prn,  to f/u any worsening symptoms or concerns  Followup: Return in about 3 months (around 08/22/2022).  Cathlean Cower, MD 05/22/2022 7:11 PM [facility]

## 2022-05-22 NOTE — Assessment & Plan Note (Signed)
Ok for change gabapentin to klonopin 1 mg qhs

## 2022-05-22 NOTE — Patient Instructions (Addendum)
You had the steroid shot today  Please take all new medication as prescribed - the meclizine for dizziness vertigo as needed, zofran ODT for nausea as needed, the prednisone, and the cream for the hands as needed  Also ok to stop the gabapentin, and change to clonazepam 1 mg at bedtime to help with the restless leg AND the sleep  Please continue all other medications as before, and refills have been done if requested.  Please have the pharmacy call with any other refills you may need.  Please keep your appointments with your specialists as you may have planned  Please make an Appointment to return in 3 months, or sooner if needed

## 2022-05-22 NOTE — Assessment & Plan Note (Signed)
Also for triam cr prn,  to f/u any worsening symptoms or concerns

## 2022-05-22 NOTE — Assessment & Plan Note (Signed)
Last vitamin D Lab Results  Component Value Date   VD25OH 30.36 02/28/2022   Low, to start oral replacement

## 2022-05-23 ENCOUNTER — Other Ambulatory Visit: Payer: Self-pay | Admitting: Internal Medicine

## 2022-05-23 ENCOUNTER — Other Ambulatory Visit: Payer: Self-pay | Admitting: Cardiovascular Disease

## 2022-05-24 ENCOUNTER — Other Ambulatory Visit: Payer: Self-pay | Admitting: Internal Medicine

## 2022-06-14 ENCOUNTER — Other Ambulatory Visit: Payer: Self-pay | Admitting: Cardiovascular Disease

## 2022-06-18 NOTE — Progress Notes (Deleted)
[facility] Phone: [phone] Subjective:    I'm seeing this patient by the request  of:  [name], MD  CC:   NOI:[id number]  11/16/2020 Patient does have good improvement with the strength.  Patient's neck still has some mild limited limited range of motion.  Gabapentin is helping him with nighttime and is states that he is feeling 75 to 80% better at this time.  Patient is very active and has even been driving his tractor and using a backpack powered leaf blower.  At this point he would like to continue the conservative therapy.  Once again discussed the possibility of advanced imaging which patient declined also discussed potential physical therapy which patient declined.  No side effects to the low-dose of the gabapentin.  Follow-up with me again in 7 to 8 weeks.  Updated 06/19/2022 Keith Herrera is a 88 y.o. male coming in with complaint of right shoulder pain       Past Medical History:  Diagnosis Date   Adenomatous polyp of colon 05/2004   Allergic rhinitis    Anxiety    Aortic insufficiency 03/01/2022   Echocardiogram 02/2022: EF 60-65, no RWMA, Gr 2 DD, normal RVSF, mild to mod LAE, mild MR, mild AI, AV sclerosis w/o AS   BPH (benign prostatic hyperplasia)    CAD (coronary artery disease)    s/p Cypher DES to LAD and pD1 2010;  LHC was done 6/12: EF 65%, circumflex patent, mid RCA 80-90% (small and nondominant), LAD and diagonal stents patent, LAD at the origin of the first diagonal 30%, ostial D2 90%, mid 80-90% (small vessel).  Continued medical therapy was recommended.    Carotid stenosis    dopplers 02/2012: 9-45% RICA; 03-88% LICA   Coronary artery disease involving native coronary artery of native heart with angina pectoris ([place]) 11/02/2008   Myoview 02/2022: EF 76, no ischemia or infarction; low risk     Diabetes type 2, controlled ([place])    Diverticulosis    colon   ED (erectile dysfunction)     Esophageal stricture    GERD (gastroesophageal reflux disease)    Hemorrhoids    Hiatal hernia    Hyperlipidemia    Hypertension    IBS (irritable bowel syndrome)    PVC's (premature ventricular contractions) 01/21/2020   Monitor 07/2019: PVC burden 21.6% // Echocardiogram 01/2020: EF 65-70, no RWMA, Gr 2 DD, normal RVSF, RVSP 43.3 mmHg (mod pul HTN), mild LAE, mild MR, trivial AI   Right inguinal hernia    s/p repair in 8/12   SVT (supraventricular tachycardia) ([place])    Past Surgical History:  Procedure Laterality Date   CARDIAC CATHETERIZATION N/A 12/10/2016   Procedure: Left Heart Cath and Coronary Angiography;  Surgeon: Jettie Booze, MD;  Location: [facility];  Service: Cardiovascular;  Laterality: N/A;   CARDIAC CATHETERIZATION N/A 12/10/2016   Procedure: Coronary Balloon Angioplasty;  Surgeon: Jettie Booze, MD;  Location: [facility];  Service: Cardiovascular;  Laterality: N/A;   CARDIAC CATHETERIZATION N/A 12/10/2016   Procedure: Intravascular Pressure Wire/FFR Study;  Surgeon: Jettie Booze, MD;  Location: [facility];  Service: Cardiovascular;  Laterality: N/A;   CORONARY STENT PLACEMENT  10/2008   x 2   ELECTROPHYSIOLOGIC STUDY N/A 03/20/2015   no inducible SVT - Dr Irish Elders HERNIA REPAIR Right    RIH with ultrapro patch   Social History  Socioeconomic History   Marital status: Married    Spouse name: Not on file   Number of children: 3   Years of education: Not on file   Highest education level: Not on file  Occupational History   Occupation: Recruitment consultant for [employer]    Employer: RETIRED  Tobacco Use   Smoking status: Former    Types: Cigarettes    Quit date: 03/05/1956    Years since quitting: 66.3   Smokeless tobacco: Never   Tobacco comments:    smoked in teens  Vaping Use   Vaping Use: Never used  Substance and Sexual Activity   Alcohol use: No    Alcohol/week: 0.0 standard drinks of alcohol   Drug  use: No   Sexual activity: Not on file  Other Topics Concern   Not on file  Social History Narrative   Lives with wife in a 3 story home.  Has 2 living children.  1 passed away in a car accident.     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     Social Determinants of Health   Financial Resource Strain: Low Risk  (05/17/2022)   Overall Financial Resource Strain (CARDIA)    Difficulty of Paying Living Expenses: Not hard at all  Food Insecurity: No Food Insecurity (05/17/2022)   Hunger Vital Sign    Worried About Running Out of Food in the Last Year: Never true    Ran Out of Food in the Last Year: Never true  Transportation Needs: No Transportation Needs (05/17/2022)   PRAPARE - Hydrologist (Medical): No    Lack of Transportation (Non-Medical): No  Physical Activity: Sufficiently Active (05/17/2022)   Exercise Vital Sign    Days of Exercise per Week: 5 days    Minutes of Exercise per Session: 30 min  Stress: No Stress Concern Present (05/17/2022)   [place]    Feeling of Stress : Not at all  Social Connections: [place] (05/17/2022)   Social Connection and Isolation Panel [NHANES]    Frequency of Communication with Friends and Family: More than three times a week    Frequency of Social Gatherings with Friends and Family: More than three times a week    Attends Religious Services: More than 4 times per year    Active Member of [employer] or Organizations: Yes    Attends Music therapist: More than 4 times per year    Marital Status: Married   Allergies  Allergen Reactions   Crestor [Rosuvastatin Calcium] Rash    All over body   Lovastatin Itching and Rash   Family History  Problem Relation Age of Onset   Bladder Cancer Mother 102   Heart attack Father 48       Deceased   Pancreatic cancer Sister 51    Healthy Son    Healthy Daughter    Colon cancer Neg Hx     Current Outpatient Medications (Endocrine & Metabolic):    glipiZIDE (GLUCOTROL XL) 5 MG 24 hr tablet, TAKE 1 TABLET EVERY DAY WITH BREAKFAST ANNUAL APPT DUE IN JULY MUST SEE PROVIDER FOR FUTURE REFILLS   linagliptin (TRADJENTA) 5 MG TABS tablet, Take 1 tablet (5 mg total) by mouth daily.   metFORMIN (GLUCOPHAGE-XR) 500 MG 24 hr tablet, Take 2 tablets (1,000 mg total) by mouth daily with breakfast.   predniSONE (  DELTASONE) 10 MG tablet, 2 tabs by mouth per day for 5 days  Current Outpatient Medications (Cardiovascular):    amLODipine (NORVASC) 2.5 MG tablet, Take 1 tablet (2.5 mg total) by mouth daily.   isosorbide mononitrate (IMDUR) 30 MG 24 hr tablet, Take 0.5 tablets (15 mg total) by mouth daily.   losartan (COZAAR) 100 MG tablet, TAKE 1 TABLET BY MOUTH EVERY DAY   metoprolol succinate (TOPROL XL) 25 MG 24 hr tablet, Take 0.5 tablets (12.5 mg total) by mouth daily.   nitroGLYCERIN (NITROSTAT) 0.4 MG SL tablet, PLACE 1 TABLET UNDER THE TONGUE EVERY 5 MINUTES AS NEEDED FOR CHEST PAIN   simvastatin (ZOCOR) 40 MG tablet, TAKE 1 TABLET BY MOUTH EVERYDAY AT BEDTIME   Current Outpatient Medications (Analgesics):    aspirin EC 81 MG tablet, Take 81 mg by mouth daily after supper.   Current Outpatient Medications (Other):    ACCU-CHEK AVIVA PLUS test strip, USE FOR TESTING UP TO 4 TIMES DAILY AS DIRECTED   blood glucose meter kit and supplies, Dispense based on patient and insurance preference. Use up to four times daily as directed. (FOR ICD-10 E10.9, E11.9).   Blood Glucose Monitoring Suppl (ONE TOUCH ULTRA 2) w/Device KIT, Use as directed E 11.9   brimonidine (ALPHAGAN) 0.2 % ophthalmic solution, Administer 1 drop to both eyes Three (3) times a day.   clonazePAM (KLONOPIN) 1 MG tablet, Take 1 tablet (1 mg total) by mouth at bedtime as needed (sleep and restless leg).   finasteride (PROSCAR) 5 MG tablet, TAKE 1 TABLET (5 MG TOTAL)  BY MOUTH DAILY.   Lancets MISC, Use as directed twice daily E11.9   latanoprost (XALATAN) 0.005 % ophthalmic solution, Place 1 drop into both eyes at bedtime.   meclizine (ANTIVERT) 12.5 MG tablet, TAKE 1 TABLET BY MOUTH 3 TIMES DAILY AS NEEDED FOR DIZZINESS.   ondansetron (ZOFRAN-ODT) 4 MG disintegrating tablet, Take 1 tablet (4 mg total) by mouth every 8 (eight) hours as needed for nausea or vomiting.   pantoprazole (PROTONIX) 40 MG tablet, TAKE 1 TABLET BY MOUTH EVERY DAY   triamcinolone cream (KENALOG) 0.1 %, Apply 1 Application topically 2 (two) times daily.   Reviewed prior external information including notes and imaging from  primary care provider As well as notes that were available from care everywhere and other healthcare systems.  Past medical history, social, surgical and family history all reviewed in electronic medical record.  No pertanent information unless stated regarding to the chief complaint.   Review of Systems:  No headache, visual changes, nausea, vomiting, diarrhea, constipation, dizziness, abdominal pain, skin rash, fevers, chills, night sweats, weight loss, swollen lymph nodes, body aches, joint swelling, chest pain, shortness of breath, mood changes. POSITIVE muscle aches  Objective  There were no vitals taken for this visit.   General: No apparent distress alert and oriented x3 mood and affect normal, dressed appropriately.  HEENT: Pupils equal, extraocular movements intact  Respiratory: Patient's speak in full sentences and does not appear short of breath  Cardiovascular: No lower extremity edema, non tender, no erythema      Impression and Recommendations:

## 2022-06-19 ENCOUNTER — Ambulatory Visit: Payer: Medicare HMO | Admitting: Family Medicine

## 2022-06-20 NOTE — Progress Notes (Deleted)
[facility] [place] [place] Phone: [phone] Subjective:    I'm seeing this patient by the request  of:  Keith Borg, MD  CC:   IRS:[id number]  11/16/2020 Patient does have good improvement with the strength.  Patient's neck still has some mild limited limited range of motion.  Gabapentin is helping him with nighttime and is states that he is feeling 75 to 80% better at this time.  Patient is very active and has even been driving his tractor and using a backpack powered leaf blower.  At this point he would like to continue the conservative therapy.  Once again discussed the possibility of advanced imaging which patient declined also discussed potential physical therapy which patient declined.  No side effects to the low-dose of the gabapentin.  Follow-up with me again in 7 to 8 weeks.  Updated 06/25/2022 Keith Herrera is a 88 y.o. male coming in with complaint of right shoulder pain       Past Medical History:  Diagnosis Date   Adenomatous polyp of colon 05/2004   Allergic rhinitis    Anxiety    Aortic insufficiency 03/01/2022   Echocardiogram 02/2022: EF 60-65, no RWMA, Gr 2 DD, normal RVSF, mild to mod LAE, mild MR, mild AI, AV sclerosis w/o AS   BPH (benign prostatic hyperplasia)    CAD (coronary artery disease)    s/p Cypher DES to LAD and pD1 2010;  LHC was done 6/12: EF 65%, circumflex patent, mid RCA 80-90% (small and nondominant), LAD and diagonal stents patent, LAD at the origin of the first diagonal 30%, ostial D2 90%, mid 80-90% (small vessel).  Continued medical therapy was recommended.    Carotid stenosis    dopplers 02/2012: 0-93% RICA; 81-82% LICA   Coronary artery disease involving native coronary artery of native heart with angina pectoris ([place]) 11/02/2008   Myoview 02/2022: EF 76, no ischemia or infarction; low risk     Diabetes type 2, controlled ([place])    Diverticulosis    colon   ED (erectile dysfunction)     Esophageal stricture    GERD (gastroesophageal reflux disease)    Hemorrhoids    Hiatal hernia    Hyperlipidemia    Hypertension    IBS (irritable bowel syndrome)    PVC's (premature ventricular contractions) 01/21/2020   Monitor 07/2019: PVC burden 21.6% // Echocardiogram 01/2020: EF 65-70, no RWMA, Gr 2 DD, normal RVSF, RVSP 43.3 mmHg (mod pul HTN), mild LAE, mild MR, trivial AI   Right inguinal hernia    s/p repair in 8/12   SVT (supraventricular tachycardia) ([place])    Past Surgical History:  Procedure Laterality Date   CARDIAC CATHETERIZATION N/A 12/10/2016   Procedure: Left Heart Cath and Coronary Angiography;  Surgeon: Keith Booze, MD;  Location: [facility];  Service: Cardiovascular;  Laterality: N/A;   CARDIAC CATHETERIZATION N/A 12/10/2016   Procedure: Coronary Balloon Angioplasty;  Surgeon: Keith Booze, MD;  Location: [facility];  Service: Cardiovascular;  Laterality: N/A;   CARDIAC CATHETERIZATION N/A 12/10/2016   Procedure: Intravascular Pressure Wire/FFR Study;  Surgeon: Keith Booze, MD;  Location: [facility];  Service: Cardiovascular;  Laterality: N/A;   CORONARY STENT PLACEMENT  10/2008   x 2   ELECTROPHYSIOLOGIC STUDY N/A 03/20/2015   no inducible SVT - Dr Keith Herrera HERNIA REPAIR Right    RIH with ultrapro patch   Social History  Socioeconomic History   Marital status: Married    Spouse name: Not on file   Number of children: 3   Years of education: Not on file   Highest education level: Not on file  Occupational History   Occupation: Recruitment consultant for [employer]    Employer: RETIRED  Tobacco Use   Smoking status: Former    Types: Cigarettes    Quit date: 03/05/1956    Years since quitting: 66.3   Smokeless tobacco: Never   Tobacco comments:    smoked in teens  Vaping Use   Vaping Use: Never used  Substance and Sexual Activity   Alcohol use: No    Alcohol/week: 0.0 standard drinks of alcohol   Drug  use: No   Sexual activity: Not on file  Other Topics Concern   Not on file  Social History Narrative   Lives with wife in a 3 story home.  Has 2 living children.  1 passed away in a car accident.     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     Social Determinants of Health   Financial Resource Strain: Low Risk  (05/17/2022)   Overall Financial Resource Strain (CARDIA)    Difficulty of Paying Living Expenses: Not hard at all  Food Insecurity: No Food Insecurity (05/17/2022)   Hunger Vital Sign    Worried About Running Out of Food in the Last Year: Never true    Ran Out of Food in the Last Year: Never true  Transportation Needs: No Transportation Needs (05/17/2022)   PRAPARE - Hydrologist (Medical): No    Lack of Transportation (Non-Medical): No  Physical Activity: Sufficiently Active (05/17/2022)   Exercise Vital Sign    Days of Exercise per Week: 5 days    Minutes of Exercise per Session: 30 min  Stress: No Stress Concern Present (05/17/2022)   [place]    Feeling of Stress : Not at all  Social Connections: [place] (05/17/2022)   Social Connection and Isolation Panel [NHANES]    Frequency of Communication with Friends and Family: More than three times a week    Frequency of Social Gatherings with Friends and Family: More than three times a week    Attends Religious Services: More than 4 times per year    Active Member of [employer] or Organizations: Yes    Attends Music therapist: More than 4 times per year    Marital Status: Married   Allergies  Allergen Reactions   Crestor [Rosuvastatin Calcium] Rash    All over body   Lovastatin Itching and Rash   Family History  Problem Relation Age of Onset   Bladder Cancer Mother 102   Heart attack Father 48       Deceased   Pancreatic cancer Sister 51    Healthy Son    Healthy Daughter    Colon cancer Neg Hx     Current Outpatient Medications (Endocrine & Metabolic):    glipiZIDE (GLUCOTROL XL) 5 MG 24 hr tablet, TAKE 1 TABLET EVERY DAY WITH BREAKFAST ANNUAL APPT DUE IN JULY MUST SEE PROVIDER FOR FUTURE REFILLS   linagliptin (TRADJENTA) 5 MG TABS tablet, Take 1 tablet (5 mg total) by mouth daily.   metFORMIN (GLUCOPHAGE-XR) 500 MG 24 hr tablet, Take 2 tablets (1,000 mg total) by mouth daily with breakfast.   predniSONE (  DELTASONE) 10 MG tablet, 2 tabs by mouth per day for 5 days  Current Outpatient Medications (Cardiovascular):    amLODipine (NORVASC) 2.5 MG tablet, Take 1 tablet (2.5 mg total) by mouth daily.   isosorbide mononitrate (IMDUR) 30 MG 24 hr tablet, Take 0.5 tablets (15 mg total) by mouth daily.   losartan (COZAAR) 100 MG tablet, TAKE 1 TABLET BY MOUTH EVERY DAY   metoprolol succinate (TOPROL XL) 25 MG 24 hr tablet, Take 0.5 tablets (12.5 mg total) by mouth daily.   nitroGLYCERIN (NITROSTAT) 0.4 MG SL tablet, PLACE 1 TABLET UNDER THE TONGUE EVERY 5 MINUTES AS NEEDED FOR CHEST PAIN   simvastatin (ZOCOR) 40 MG tablet, TAKE 1 TABLET BY MOUTH EVERYDAY AT BEDTIME   Current Outpatient Medications (Analgesics):    aspirin EC 81 MG tablet, Take 81 mg by mouth daily after supper.   Current Outpatient Medications (Other):    ACCU-CHEK AVIVA PLUS test strip, USE FOR TESTING UP TO 4 TIMES DAILY AS DIRECTED   blood glucose meter kit and supplies, Dispense based on patient and insurance preference. Use up to four times daily as directed. (FOR ICD-10 E10.9, E11.9).   Blood Glucose Monitoring Suppl (ONE TOUCH ULTRA 2) w/Device KIT, Use as directed E 11.9   brimonidine (ALPHAGAN) 0.2 % ophthalmic solution, Administer 1 drop to both eyes Three (3) times a day.   clonazePAM (KLONOPIN) 1 MG tablet, Take 1 tablet (1 mg total) by mouth at bedtime as needed (sleep and restless leg).   finasteride (PROSCAR) 5 MG tablet, TAKE 1 TABLET (5 MG TOTAL)  BY MOUTH DAILY.   Lancets MISC, Use as directed twice daily E11.9   latanoprost (XALATAN) 0.005 % ophthalmic solution, Place 1 drop into both eyes at bedtime.   meclizine (ANTIVERT) 12.5 MG tablet, TAKE 1 TABLET BY MOUTH 3 TIMES DAILY AS NEEDED FOR DIZZINESS.   ondansetron (ZOFRAN-ODT) 4 MG disintegrating tablet, Take 1 tablet (4 mg total) by mouth every 8 (eight) hours as needed for nausea or vomiting.   pantoprazole (PROTONIX) 40 MG tablet, TAKE 1 TABLET BY MOUTH EVERY DAY   triamcinolone cream (KENALOG) 0.1 %, Apply 1 Application topically 2 (two) times daily.   Reviewed prior external information including notes and imaging from  primary care provider As well as notes that were available from care everywhere and other healthcare systems.  Past medical history, social, surgical and family history all reviewed in electronic medical record.  No pertanent information unless stated regarding to the chief complaint.   Review of Systems:  No headache, visual changes, nausea, vomiting, diarrhea, constipation, dizziness, abdominal pain, skin rash, fevers, chills, night sweats, weight loss, swollen lymph nodes, body aches, joint swelling, chest pain, shortness of breath, mood changes. POSITIVE muscle aches  Objective  There were no vitals taken for this visit.   General: No apparent distress alert and oriented x3 mood and affect normal, dressed appropriately.  HEENT: Pupils equal, extraocular movements intact  Respiratory: Patient's speak in full sentences and does not appear short of breath  Cardiovascular: No lower extremity edema, non tender, no erythema      Impression and Recommendations:

## 2022-06-25 ENCOUNTER — Ambulatory Visit: Payer: Medicare HMO | Admitting: Family Medicine

## 2022-07-23 ENCOUNTER — Other Ambulatory Visit: Payer: Self-pay | Admitting: Internal Medicine

## 2022-07-23 NOTE — Telephone Encounter (Signed)
Please refill as per office routine med refill policy (all routine meds to be refilled for 3 mo or monthly (per pt preference) up to one year from last visit, then month to month grace period for 3 mo, then further med refills will have to be denied) ? ?

## 2022-07-30 ENCOUNTER — Other Ambulatory Visit: Payer: Self-pay | Admitting: Internal Medicine

## 2022-07-30 NOTE — Telephone Encounter (Signed)
Please refill as per office routine med refill policy (all routine meds to be refilled for 3 mo or monthly (per pt preference) up to one year from last visit, then month to month grace period for 3 mo, then further med refills will have to be denied) ? ?

## 2022-08-22 ENCOUNTER — Encounter: Payer: Self-pay | Admitting: Internal Medicine

## 2022-08-22 ENCOUNTER — Other Ambulatory Visit: Payer: Self-pay | Admitting: Internal Medicine

## 2022-08-22 ENCOUNTER — Ambulatory Visit (INDEPENDENT_AMBULATORY_CARE_PROVIDER_SITE_OTHER): Payer: Medicare HMO | Admitting: Internal Medicine

## 2022-08-22 VITALS — BP 116/56 | HR 51 | Temp 97.7°F | Ht 70.0 in | Wt 156.0 lb

## 2022-08-22 DIAGNOSIS — Z23 Encounter for immunization: Secondary | ICD-10-CM

## 2022-08-22 DIAGNOSIS — R202 Paresthesia of skin: Secondary | ICD-10-CM | POA: Diagnosis not present

## 2022-08-22 DIAGNOSIS — E1159 Type 2 diabetes mellitus with other circulatory complications: Secondary | ICD-10-CM

## 2022-08-22 DIAGNOSIS — K219 Gastro-esophageal reflux disease without esophagitis: Secondary | ICD-10-CM | POA: Diagnosis not present

## 2022-08-22 DIAGNOSIS — I10 Essential (primary) hypertension: Secondary | ICD-10-CM | POA: Diagnosis not present

## 2022-08-22 LAB — BASIC METABOLIC PANEL
BUN: 20 mg/dL (ref 6–23)
CO2: 28 mEq/L (ref 19–32)
Calcium: 9.4 mg/dL (ref 8.4–10.5)
Chloride: 105 mEq/L (ref 96–112)
Creatinine, Ser: 1.14 mg/dL (ref 0.40–1.50)
GFR: 57.45 mL/min — ABNORMAL LOW (ref >60.00)
Glucose, Bld: 147 mg/dL — ABNORMAL HIGH (ref 70–99)
Potassium: 4.2 mEq/L (ref 3.5–5.1)
Sodium: 140 mEq/L (ref 135–145)

## 2022-08-22 LAB — HEPATIC FUNCTION PANEL
ALT: 14 U/L (ref 0–53)
AST: 15 U/L (ref 0–37)
Albumin: 4.1 g/dL (ref 3.5–5.2)
Alkaline Phosphatase: 43 U/L (ref 39–117)
Bilirubin, Direct: 0.1 mg/dL (ref 0.0–0.3)
Total Bilirubin: 0.6 mg/dL (ref 0.2–1.2)
Total Protein: 6.8 g/dL (ref 6.0–8.3)

## 2022-08-22 LAB — LIPID PANEL
Cholesterol: 125 mg/dL (ref 0–200)
HDL: 35.3 mg/dL — ABNORMAL LOW (ref >39.00)
LDL Cholesterol: 54 mg/dL (ref 0–99)
NonHDL: 89.95
Total CHOL/HDL Ratio: 4
Triglycerides: 179 mg/dL — ABNORMAL HIGH (ref 0.0–149.0)
VLDL: 35.8 mg/dL (ref 0.0–40.0)

## 2022-08-22 LAB — HEMOGLOBIN A1C: Hgb A1c MFr Bld: 8.3 % — ABNORMAL HIGH (ref 4.6–6.5)

## 2022-08-22 MED ORDER — OMEPRAZOLE 40 MG PO CPDR: 40.0000 mg | DELAYED_RELEASE_CAPSULE | Freq: Every day | ORAL | 3 refills | Status: DC

## 2022-08-22 MED ORDER — METFORMIN HCL ER 500 MG PO TB24: 1500.0000 mg | ORAL_TABLET | Freq: Every day | ORAL | 3 refills | Status: DC

## 2022-08-22 NOTE — Patient Instructions (Signed)
You had the flu shot today  Please have your shingles shot (both of them) and the COVID shot at the pharmacy  Please wear the left wrist splint at night only  Ok to change the protonix to the prilosec   Ok to STOP the amlodipine  Please continue all other medications as before, and refills have been done if requested.  Please have the pharmacy call with any other refills you may need.  Please continue your efforts at being more active, low cholesterol diet, and weight control.  Please keep your appointments with your specialists as you may have planned  Please go to the LAB at the blood drawing area for the tests to be done  You will be contacted by phone if any changes need to be made immediately.  Otherwise, you will receive a letter about your results with an explanation, but please check with MyChart first.  Please remember to sign up for MyChart if you have not done so, as this will be important to you in the future with finding out test results, communicating by private email, and scheduling acute appointments online when needed.  Please make an Appointment to return in 6 months, or sooner if needed

## 2022-08-22 NOTE — Progress Notes (Signed)
Patient ID: Keith Herrera, male   DOB: [birth year], 88 y.o.   MRN: [id number]        Chief Complaint: follow up left CTS symptoms, htn, gerd,       HPI:  Keith Herrera is a 88 y.o. male here overall doing ok, Pt denies chest pain, increased sob or doe, wheezing, orthopnea, PND, increased LE swelling, palpitations, dizziness or syncope.   Pt denies polydipsia, polyuria, or new focal neuro s/s.    Pt denies fever, wt loss, night sweats, loss of appetite, or other constitutional symptoms  has also had mild worsening reflux, but no abd pain, dysphagia, n/v, bowel change or blood, despite taking the protonix.  Also has AM left hand and wrist numbness on awakening without pain or weakness, and better later in the day, nothing seems to make better or worse.  BP has been low normal at home.  Has lost several lbs recently       Wt Readings from Last 3 Encounters:  08/22/22 156 lb (70.8 kg)  05/22/22 159 lb 12.8 oz (72.5 kg)  04/08/22 159 lb 3.2 oz (72.2 kg)   BP Readings from Last 3 Encounters:  08/22/22 (!) 116/56  04/08/22 130/60  02/28/22 118/60         Past Medical History:  Diagnosis Date   Adenomatous polyp of colon 05/2004   Allergic rhinitis    Anxiety    Aortic insufficiency 03/01/2022   Echocardiogram 02/2022: EF 60-65, no RWMA, Gr 2 DD, normal RVSF, mild to mod LAE, mild MR, mild AI, AV sclerosis w/o AS   BPH (benign prostatic hyperplasia)    CAD (coronary artery disease)    s/p Cypher DES to LAD and pD1 2010;  LHC was done 6/12: EF 65%, circumflex patent, mid RCA 80-90% (small and nondominant), LAD and diagonal stents patent, LAD at the origin of the first diagonal 30%, ostial D2 90%, mid 80-90% (small vessel).  Continued medical therapy was recommended.    Carotid stenosis    dopplers 02/2012: 2-59% RICA; 56-38% LICA   Coronary artery disease involving native coronary artery of native Keith with angina pectoris (Keith Herrera) 11/02/2008   Myoview 02/2022: EF 76, no ischemia or infarction;  low risk     Diabetes type 2, controlled (Keith Herrera)    Diverticulosis    colon   ED (erectile dysfunction)    Esophageal stricture    GERD (gastroesophageal reflux disease)    Hemorrhoids    Hiatal hernia    Hyperlipidemia    Hypertension    IBS (irritable bowel syndrome)    PVC's (premature ventricular contractions) 01/21/2020   Monitor 07/2019: PVC burden 21.6% // Echocardiogram 01/2020: EF 65-70, no RWMA, Gr 2 DD, normal RVSF, RVSP 43.3 mmHg (mod pul HTN), mild LAE, mild MR, trivial AI   Right inguinal hernia    s/p repair in 8/12   SVT (supraventricular tachycardia)    Past Surgical History:  Procedure Laterality Date   CARDIAC CATHETERIZATION N/A 12/10/2016   Procedure: Left Keith Cath and Coronary Angiography;  Surgeon: Keith Booze, MD;  Location: [facility];  Service: Cardiovascular;  Laterality: N/A;   CARDIAC CATHETERIZATION N/A 12/10/2016   Procedure: Coronary Balloon Angioplasty;  Surgeon: Keith Booze, MD;  Location: [facility];  Service: Cardiovascular;  Laterality: N/A;   CARDIAC CATHETERIZATION N/A 12/10/2016   Procedure: Intravascular Pressure Wire/FFR Study;  Surgeon: Keith Booze, MD;  Location: [facility];  Service: Cardiovascular;  Laterality:  N/A;   CORONARY STENT PLACEMENT  10/2008   x 2   ELECTROPHYSIOLOGIC STUDY N/A 03/20/2015   no inducible SVT - Keith Herrera HERNIA REPAIR Right    RIH with ultrapro patch    reports that he quit smoking about 66 years ago. His smoking use included cigarettes. He has never used smokeless tobacco. He reports that he does not drink alcohol and does not use drugs. family history includes Keith Herrera (age of onset: 53) in his mother; Keith Herrera; Keith Herrera (age of onset: 9) in his father; Keith Herrera (age of onset: 5) in his sister. Allergies  Allergen Reactions   Crestor [Rosuvastatin Calcium] Rash    All over body   Lovastatin Itching and Rash    Current Outpatient Medications on File Prior to Visit  Medication Sig Dispense Refill   ACCU-CHEK AVIVA PLUS test strip USE FOR TESTING UP TO 4 TIMES DAILY AS DIRECTED 100 strip 2   aspirin EC 81 MG tablet Take 81 mg by mouth daily after supper.     blood glucose meter kit and supplies Dispense based on patient and insurance preference. Use up to four times daily as directed. (FOR ICD-10 E10.9, E11.9). 1 each 0   Blood Glucose Monitoring Suppl (ONE TOUCH ULTRA 2) w/Device KIT Use as directed E 11.9 1 each 0   brimonidine (ALPHAGAN) 0.2 % ophthalmic solution Administer 1 drop to both eyes Three (3) times a day.     clonazePAM (KLONOPIN) 1 MG tablet Take 1 tablet (1 mg total) by mouth at bedtime as needed (sleep and restless leg). 30 tablet 5   finasteride (PROSCAR) 5 MG tablet TAKE 1 TABLET (5 MG TOTAL) BY MOUTH DAILY. 90 tablet 3   glipiZIDE (GLUCOTROL XL) 5 MG 24 hr tablet TAKE 1 TABLET EVERY DAY WITH BREAKFAST 90 tablet 3   isosorbide mononitrate (IMDUR) 30 MG 24 hr tablet Take 0.5 tablets (15 mg total) by mouth daily. 45 tablet 3   Lancets MISC Use as directed twice daily E11.9 200 each 3   latanoprost (XALATAN) 0.005 % ophthalmic solution Place 1 drop into both eyes at bedtime.  12   linagliptin (TRADJENTA) 5 MG TABS tablet Take 1 tablet (5 mg total) by mouth daily. 90 tablet 3   losartan (COZAAR) 100 MG tablet TAKE 1 TABLET BY MOUTH EVERY DAY 90 tablet 3   meclizine (ANTIVERT) 12.5 MG tablet TAKE 1 TABLET BY MOUTH 3 TIMES DAILY AS NEEDED FOR DIZZINESS. 90 tablet 0   metoprolol succinate (TOPROL XL) 25 MG 24 hr tablet Take 0.5 tablets (12.5 mg total) by mouth daily. 45 tablet 3   nitroGLYCERIN (NITROSTAT) 0.4 MG SL tablet PLACE 1 TABLET UNDER THE TONGUE EVERY 5 MINUTES AS NEEDED FOR CHEST PAIN 25 tablet 2   ondansetron (ZOFRAN-ODT) 4 MG disintegrating tablet Take 1 tablet (4 mg total) by mouth every 8 (eight) hours as needed for nausea or vomiting. 30 tablet 1   simvastatin (ZOCOR) 40 MG  tablet TAKE 1 TABLET BY MOUTH EVERYDAY AT BEDTIME 90 tablet 3   triamcinolone cream (KENALOG) 0.1 % Apply 1 Application topically 2 (two) times daily. 30 g 2   No current facility-administered medications on file prior to visit.        ROS:  All others reviewed and negative.  Objective        PE:  BP (!) 116/56 (BP Location: Right Arm, Patient Position: Sitting, Cuff Size: Normal)   Pulse Marland Kitchen)  51   Temp 97.7 F (36.5 C) (Oral)   Ht '5\' 10"'  (1.778 m)   Wt 156 lb (70.8 kg)   SpO2 98%   BMI 22.38 kg/m                 Constitutional: Pt appears in NAD               HENT: Head: NCAT.                Right Ear: External ear normal.                 Left Ear: External ear normal.                Eyes: . Pupils are equal, round, and reactive to light. Conjunctivae and EOM are normal               Nose: without d/c or deformity               Neck: Neck supple. Gross normal ROM               Cardiovascular: Normal rate and regular rhythm.                 Pulmonary/Chest: Effort normal and breath sounds without rales or wheezing.                Abd:  Soft, NT, ND, + BS, no organomegaly               Neurological: Pt is alert. At baseline orientation, motor grossly intact               Skin: Skin is warm. No rashes, no other new lesions, LE edema - none               Psychiatric: Pt behavior is normal without agitation   Micro: none  Cardiac tracings I have personally interpreted today:  none  Pertinent Radiological findings (summarize): none   Lab Results  Component Value Date   WBC 6.7 02/28/2022   HGB 14.1 02/28/2022   HCT 42.1 02/28/2022   PLT 171.0 02/28/2022   GLUCOSE 147 (H) 08/22/2022   CHOL 125 08/22/2022   TRIG 179.0 (H) 08/22/2022   HDL 35.30 (L) 08/22/2022   LDLDIRECT 82.0 05/19/2018   LDLCALC 54 08/22/2022   ALT 14 08/22/2022   AST 15 08/22/2022   NA 140 08/22/2022   K 4.2 08/22/2022   CL 105 08/22/2022   CREATININE 1.14 08/22/2022   BUN 20 08/22/2022   CO2 28  08/22/2022   TSH 1.93 02/28/2022   PSA 2.49 12/21/2012   INR 1.15 12/10/2016   HGBA1C 8.3 (H) 08/22/2022   MICROALBUR 3.5 (H) 02/28/2022   Assessment/Plan:  Keith Herrera is a 88 y.o. White or Caucasian [1] male with  has a past medical history of Adenomatous polyp of colon (05/2004), Allergic rhinitis, Anxiety, Aortic insufficiency (03/01/2022), BPH (benign prostatic hyperplasia), CAD (coronary artery disease), Carotid stenosis, Coronary artery disease involving native coronary artery of native Keith with angina pectoris ([place]) (11/02/2008), Diabetes type 2, controlled ([place]), Diverticulosis, ED (erectile dysfunction), Esophageal stricture, GERD (gastroesophageal reflux disease), Hemorrhoids, Hiatal hernia, Hyperlipidemia, Hypertension, IBS (irritable bowel syndrome), PVC's (premature ventricular contractions) (01/21/2020), Right inguinal hernia, and SVT (supraventricular tachycardia).  Type 2 diabetes mellitus with vascular disease ([place]) Lab Results  Component Value Date   HGBA1C 8.3 (H) 08/22/2022   unocontrolled and worsning, , pt to change metormin to  100 mg am, 500 mg pm   Paresthesia of left arm C/w mild left CTS like symptoms, but no pain or weakness, pt encouraged to wear wrist splint at night  GERD Recently mild uncontrolled symptoms, for change protoinix to prilosec 40 mg qd,  to f/u any worsening symptoms or concerns  Essential hypertension With some wt loss recenlty, BP now lower normal, ok to d/c the amlodipine  Followup: No follow-ups on file.  Cathlean Cower, MD 08/24/2022 8:48 PM [facility]

## 2022-08-23 ENCOUNTER — Telehealth: Payer: Self-pay

## 2022-08-23 NOTE — Telephone Encounter (Signed)
Patient states he received a call from someone at the office.

## 2022-08-24 NOTE — Assessment & Plan Note (Signed)
Recently mild uncontrolled symptoms, for change protoinix to prilosec 40 mg qd,  to f/u any worsening symptoms or concerns

## 2022-08-24 NOTE — Assessment & Plan Note (Signed)
With some wt loss recenlty, BP now lower normal, ok to d/c the amlodipine

## 2022-08-24 NOTE — Assessment & Plan Note (Signed)
C/w mild left CTS like symptoms, but no pain or weakness, pt encouraged to wear wrist splint at night

## 2022-08-24 NOTE — Assessment & Plan Note (Signed)
Lab Results  Component Value Date   HGBA1C 8.3 (H) 08/22/2022   unocontrolled and worsning, , pt to change metormin to 100 mg am, 500 mg pm

## 2022-08-26 NOTE — Telephone Encounter (Signed)
Patient is returning call, gave message below. Understood and had no questions.

## 2022-08-27 ENCOUNTER — Other Ambulatory Visit: Payer: Self-pay | Admitting: Cardiovascular Disease

## 2022-08-29 ENCOUNTER — Ambulatory Visit: Payer: Medicare HMO | Admitting: Internal Medicine

## 2022-09-20 DIAGNOSIS — H01003 Unspecified blepharitis right eye, unspecified eyelid: Secondary | ICD-10-CM | POA: Diagnosis not present

## 2022-09-20 DIAGNOSIS — Z961 Presence of intraocular lens: Secondary | ICD-10-CM | POA: Diagnosis not present

## 2022-09-20 DIAGNOSIS — H538 Other visual disturbances: Secondary | ICD-10-CM | POA: Diagnosis not present

## 2022-09-20 DIAGNOSIS — H21233 Degeneration of iris (pigmentary), bilateral: Secondary | ICD-10-CM | POA: Diagnosis not present

## 2022-09-21 ENCOUNTER — Other Ambulatory Visit: Payer: Self-pay | Admitting: Cardiovascular Disease

## 2022-09-24 ENCOUNTER — Telehealth: Payer: Self-pay | Admitting: Internal Medicine

## 2022-09-24 NOTE — Telephone Encounter (Signed)
Patient called and said his BP is running high and he needs a refill on BP medication. He said he wanted to get a refill on Amlodipine. Call back is [phone]. [facility] in [place]

## 2022-09-26 NOTE — Telephone Encounter (Signed)
Please advise as I do not see this medication on patients list

## 2022-09-26 NOTE — Telephone Encounter (Signed)
I think he means to restart amlodipine he may have been on in the past  Pt would need OV for this, to determine the need, the dose and anything else may be needed

## 2022-09-26 NOTE — Telephone Encounter (Signed)
Spoke with patient and spouse to schedule appointment for 11/10 at 3:40.

## 2022-09-27 ENCOUNTER — Encounter: Payer: Self-pay | Admitting: Internal Medicine

## 2022-09-27 ENCOUNTER — Ambulatory Visit (INDEPENDENT_AMBULATORY_CARE_PROVIDER_SITE_OTHER): Payer: Medicare HMO | Admitting: Internal Medicine

## 2022-09-27 VITALS — BP 122/76 | HR 60 | Temp 97.7°F | Ht 70.0 in | Wt 162.0 lb

## 2022-09-27 DIAGNOSIS — L301 Dyshidrosis [pompholyx]: Secondary | ICD-10-CM | POA: Diagnosis not present

## 2022-09-27 DIAGNOSIS — I10 Essential (primary) hypertension: Secondary | ICD-10-CM

## 2022-09-27 DIAGNOSIS — R21 Rash and other nonspecific skin eruption: Secondary | ICD-10-CM | POA: Insufficient documentation

## 2022-09-27 DIAGNOSIS — E1159 Type 2 diabetes mellitus with other circulatory complications: Secondary | ICD-10-CM | POA: Diagnosis not present

## 2022-09-27 MED ORDER — TRIAMCINOLONE ACETONIDE 0.1 % EX CREA: 1.0000 | TOPICAL_CREAM | Freq: Two times a day (BID) | CUTANEOUS | 2 refills | Status: DC

## 2022-09-27 MED ORDER — EMPAGLIFLOZIN 10 MG PO TABS: 10.0000 mg | ORAL_TABLET | Freq: Every day | ORAL | 3 refills | Status: DC

## 2022-09-27 NOTE — Assessment & Plan Note (Signed)
With persistent recurring rash - for triamcinolone cr restart daily prn

## 2022-09-27 NOTE — Progress Notes (Signed)
Patient ID: Keith Herrera, male   DOB: 05/24/1932, 88 y.o.   MRN: [id number]        Chief Complaint: follow up HTN, DM, recurring rash       HPI:  Keith Herrera is a 88 y.o. male here overall doing ok, but has recurring erythem itchy rash to multiple areas of the leg, no pain or wound.  Asking for triam cream refill as this has helped before.  Pt denies chest pain, increased sob or doe, wheezing, orthopnea, PND, increased LE swelling, palpitations, dizziness or syncope.  BP at home has been mildly elevated, but his cuff and our readings today in the office seem controlled.  Pt states he does not feel tradjenta is working, asks for change.         Wt Readings from Last 3 Encounters:  09/27/22 162 lb (73.5 kg)  08/22/22 156 lb (70.8 kg)  05/22/22 159 lb 12.8 oz (72.5 kg)   BP Readings from Last 3 Encounters:  09/27/22 122/76  08/22/22 (!) 116/56  04/08/22 130/60         Past Medical History:  Diagnosis Date   Adenomatous polyp of colon 05/2004   Allergic rhinitis    Anxiety    Aortic insufficiency 03/01/2022   Echocardiogram 02/2022: EF 60-65, no RWMA, Gr 2 DD, normal RVSF, mild to mod LAE, mild MR, mild AI, AV sclerosis w/o AS   BPH (benign prostatic hyperplasia)    CAD (coronary artery disease)    s/p Cypher DES to LAD and pD1 2010;  LHC was done 6/12: EF 65%, circumflex patent, mid RCA 80-90% (small and nondominant), LAD and diagonal stents patent, LAD at the origin of the first diagonal 30%, ostial D2 90%, mid 80-90% (small vessel).  Continued medical therapy was recommended.    Carotid stenosis    dopplers 02/2012: 4-58% RICA; 09-98% LICA   Coronary artery disease involving native coronary artery of native heart with angina pectoris ([place]) 11/02/2008   Myoview 02/2022: EF 76, no ischemia or infarction; low risk     Diabetes type 2, controlled ([place])    Diverticulosis    colon   ED (erectile dysfunction)    Esophageal stricture    GERD (gastroesophageal reflux disease)     Hemorrhoids    Hiatal hernia    Hyperlipidemia    Hypertension    IBS (irritable bowel syndrome)    PVC's (premature ventricular contractions) 01/21/2020   Monitor 07/2019: PVC burden 21.6% // Echocardiogram 01/2020: EF 65-70, no RWMA, Gr 2 DD, normal RVSF, RVSP 43.3 mmHg (mod pul HTN), mild LAE, mild MR, trivial AI   Right inguinal hernia    s/p repair in 8/12   SVT (supraventricular tachycardia)    Past Surgical History:  Procedure Laterality Date   CARDIAC CATHETERIZATION N/A 12/10/2016   Procedure: Left Heart Cath and Coronary Angiography;  Surgeon: Jettie Booze, MD;  Location: [facility];  Service: Cardiovascular;  Laterality: N/A;   CARDIAC CATHETERIZATION N/A 12/10/2016   Procedure: Coronary Balloon Angioplasty;  Surgeon: Jettie Booze, MD;  Location: [facility];  Service: Cardiovascular;  Laterality: N/A;   CARDIAC CATHETERIZATION N/A 12/10/2016   Procedure: Intravascular Pressure Wire/FFR Study;  Surgeon: Jettie Booze, MD;  Location: [facility];  Service: Cardiovascular;  Laterality: N/A;   CORONARY STENT PLACEMENT  10/2008   x 2   ELECTROPHYSIOLOGIC STUDY N/A 03/20/2015   no inducible SVT - Dr Lovena Le   INGUINAL HERNIA REPAIR  Right    RIH with ultrapro patch    reports that he quit smoking about 66 years ago. His smoking use included cigarettes. He has never used smokeless tobacco. He reports that he does not drink alcohol and does not use drugs. family history includes Bladder Cancer (age of onset: 29) in his mother; Healthy in his daughter and son; Heart attack (age of onset: 89) in his father; Pancreatic cancer (age of onset: 33) in his sister. Allergies  Allergen Reactions   Crestor [Rosuvastatin Calcium] Rash    All over body   Lovastatin Itching and Rash   Current Outpatient Medications on File Prior to Visit  Medication Sig Dispense Refill   ACCU-CHEK AVIVA PLUS test strip USE FOR TESTING UP TO 4 TIMES DAILY AS DIRECTED 100 strip  2   aspirin EC 81 MG tablet Take 81 mg by mouth daily after supper.     blood glucose meter kit and supplies Dispense based on patient and insurance preference. Use up to four times daily as directed. (FOR ICD-10 E10.9, E11.9). 1 each 0   Blood Glucose Monitoring Suppl (ONE TOUCH ULTRA 2) w/Device KIT Use as directed E 11.9 1 each 0   brimonidine (ALPHAGAN) 0.2 % ophthalmic solution Administer 1 drop to both eyes Three (3) times a day.     clonazePAM (KLONOPIN) 1 MG tablet Take 1 tablet (1 mg total) by mouth at bedtime as needed (sleep and restless leg). 30 tablet 5   finasteride (PROSCAR) 5 MG tablet TAKE 1 TABLET (5 MG TOTAL) BY MOUTH DAILY. 90 tablet 3   glipiZIDE (GLUCOTROL XL) 5 MG 24 hr tablet TAKE 1 TABLET EVERY DAY WITH BREAKFAST 90 tablet 3   isosorbide mononitrate (IMDUR) 30 MG 24 hr tablet Take 0.5 tablets (15 mg total) by mouth daily. 45 tablet 3   Lancets MISC Use as directed twice daily E11.9 200 each 3   latanoprost (XALATAN) 0.005 % ophthalmic solution Place 1 drop into both eyes at bedtime.  12   losartan (COZAAR) 100 MG tablet TAKE 1 TABLET BY MOUTH EVERY DAY 90 tablet 3   meclizine (ANTIVERT) 12.5 MG tablet TAKE 1 TABLET BY MOUTH 3 TIMES DAILY AS NEEDED FOR DIZZINESS. 90 tablet 0   metFORMIN (GLUCOPHAGE-XR) 500 MG 24 hr tablet Take 3 tablets (1,500 mg total) by mouth daily with breakfast. 270 tablet 3   metoprolol succinate (TOPROL XL) 25 MG 24 hr tablet Take 0.5 tablets (12.5 mg total) by mouth daily. 45 tablet 3   nitroGLYCERIN (NITROSTAT) 0.4 MG SL tablet PLACE 1 TABLET UNDER THE TONGUE EVERY 5 MINUTES AS NEEDED FOR CHEST PAIN 25 tablet 2   omeprazole (PRILOSEC) 40 MG capsule Take 1 capsule (40 mg total) by mouth daily. 90 capsule 3   ondansetron (ZOFRAN-ODT) 4 MG disintegrating tablet Take 1 tablet (4 mg total) by mouth every 8 (eight) hours as needed for nausea or vomiting. 30 tablet 1   simvastatin (ZOCOR) 40 MG tablet TAKE 1 TABLET BY MOUTH EVERYDAY AT BEDTIME 90 tablet 3    No current facility-administered medications on file prior to visit.        ROS:  All others reviewed and negative.  Objective        PE:  BP 122/76 (BP Location: Left Arm, Patient Position: Sitting, Cuff Size: Normal)   Pulse 60   Temp 97.7 F (36.5 C) (Oral)   Ht _0  (1.778 m)   Wt 162 lb (73.5 kg)   SpO2 99%  BMI 23.24 kg/m                 Constitutional: Pt appears in NAD               HENT: Head: NCAT.                Right Ear: External ear normal.                 Left Ear: External ear normal.                Eyes: . Pupils are equal, round, and reactive to light. Conjunctivae and EOM are normal               Nose: without d/c or deformity               Neck: Neck supple. Gross normal ROM               Cardiovascular: Normal rate and regular rhythm.                 Pulmonary/Chest: Effort normal and breath sounds without rales or wheezing.                Abd:  Soft, NT, ND, + BS, no organomegaly               Neurological: Pt is alert. At baseline orientation, motor grossly intact               Skin: Skin is warm. No rashes, no other new lesions, LE edema - none               Psychiatric: Pt behavior is normal without agitation   Micro: none  Cardiac tracings I have personally interpreted today:  none  Pertinent Radiological findings (summarize): none   Lab Results  Component Value Date   WBC 6.7 02/28/2022   HGB 14.1 02/28/2022   HCT 42.1 02/28/2022   PLT 171.0 02/28/2022   GLUCOSE 147 (H) 08/22/2022   CHOL 125 08/22/2022   TRIG 179.0 (H) 08/22/2022   HDL 35.30 (L) 08/22/2022   LDLDIRECT 82.0 05/19/2018   LDLCALC 54 08/22/2022   ALT 14 08/22/2022   AST 15 08/22/2022   NA 140 08/22/2022   K 4.2 08/22/2022   CL 105 08/22/2022   CREATININE 1.14 08/22/2022   BUN 20 08/22/2022   CO2 28 08/22/2022   TSH 1.93 02/28/2022   PSA 2.49 12/21/2012   INR 1.15 12/10/2016   HGBA1C 8.3 (H) 08/22/2022   MICROALBUR 3.5 (H) 02/28/2022   Assessment/Plan:   Keith Herrera is a 88 y.o. White or Caucasian [1] male with  has a past medical history of Adenomatous polyp of colon (05/2004), Allergic rhinitis, Anxiety, Aortic insufficiency (03/01/2022), BPH (benign prostatic hyperplasia), CAD (coronary artery disease), Carotid stenosis, Coronary artery disease involving native coronary artery of native heart with angina pectoris ([place]) (11/02/2008), Diabetes type 2, controlled ([place]), Diverticulosis, ED (erectile dysfunction), Esophageal stricture, GERD (gastroesophageal reflux disease), Hemorrhoids, Hiatal hernia, Hyperlipidemia, Hypertension, IBS (irritable bowel syndrome), PVC's (premature ventricular contractions) (01/21/2020), Right inguinal hernia, and SVT (supraventricular tachycardia).  Essential hypertension BP Readings from Last 3 Encounters:  09/27/22 122/76  08/22/22 (!) 116/56  04/08/22 130/60   Stable, pt to continue medical treatment losartan 100 mg, toprol 12.5 ng qd   Type 2 diabetes mellitus with vascular disease ([place]) Lab Results  Component Value Date   HGBA1C 8.3 (H) 08/22/2022   Uncontrolled, pt states  tradjenta not working, has persistent mild high sugars,, pt to add jardiance 10 mg qd, glucotrol xl 5 mg, metformiin ER 500 mg - 3 qd   Dyshidrotic eczema With persistent recurring rash - for triamcinolone cr restart daily prn  Followup: Return in about 21 weeks (around 02/21/2023).  Cathlean Cower, MD 09/27/2022 4:53 PM [facility]

## 2022-09-27 NOTE — Patient Instructions (Addendum)
Ok to [facility] on taking the amlodipine for now  [facility] to change the tradjenta to Jardiance 10 mg per day  Please continue all other medications as before, and refills have been done if requested - triamcinlone jar  Please have the pharmacy call with any other refills you may need.  Please continue your efforts at being more active, low cholesterol diet, and weight control.  Please keep your appointments with your specialists as you may have planned

## 2022-09-27 NOTE — Assessment & Plan Note (Signed)
Lab Results  Component Value Date   HGBA1C 8.3 (H) 08/22/2022   Uncontrolled, pt states tradjenta not working, has persistent mild high sugars,, pt to add jardiance 10 mg qd, glucotrol xl 5 mg, metformiin ER 500 mg - 3 qd

## 2022-09-27 NOTE — Assessment & Plan Note (Signed)
BP Readings from Last 3 Encounters:  09/27/22 122/76  08/22/22 (!) 116/56  04/08/22 130/60   Stable, pt to continue medical treatment losartan 100 mg, toprol 12.5 ng qd

## 2022-10-02 ENCOUNTER — Ambulatory Visit: Payer: Medicare HMO | Attending: Cardiovascular Disease | Admitting: Cardiovascular Disease

## 2022-10-02 ENCOUNTER — Encounter: Payer: Self-pay | Admitting: Cardiovascular Disease

## 2022-10-02 VITALS — BP 138/70 | HR 56 | Ht 70.0 in | Wt 161.0 lb

## 2022-10-02 DIAGNOSIS — I25119 Atherosclerotic heart disease of native coronary artery with unspecified angina pectoris: Secondary | ICD-10-CM

## 2022-10-02 DIAGNOSIS — I10 Essential (primary) hypertension: Secondary | ICD-10-CM | POA: Diagnosis not present

## 2022-10-02 DIAGNOSIS — E785 Hyperlipidemia, unspecified: Secondary | ICD-10-CM | POA: Diagnosis not present

## 2022-10-02 NOTE — Progress Notes (Signed)
Cardiology Office Note:    Date:  10/02/2022   ID:  Keith Herrera, DOB October 28, 1932, MRN [id number]  PCP:  [name], MD   [place] Providers Cardiologist:  Sherren Mocha, MD Cardiology APP:  Liliane Shi, PA-C  Electrophysiologist:  Cristopher Peru, MD     Referring MD: [name], MD   Chief Complaint  Patient presents with   Coronary Artery Disease    History of Present Illness:    Keith Herrera is a 88 y.o. male with a hx of: Coronary artery disease  S/p DES to LAD and D1 in 2010 S/p POBA to D1 in 11/2016 (no stent due to prior PCI) Carotid artery disease Aortic atherosclerosis  PSVT EPS in 2016 >> no inducible SVT PVCs Symptomatic; eval by Dr. Lovena Le in past (tx options limited due to CAD) Monitor 07/2019: PVC burden 21.6% Hypertension  Hyperlipidemia  Diabetes mellitus 2  GERD  Esophageal stricture Prior hx of chest pain of GI origin  The patient is here alone today. He is doing well. States that he was able to blow leaves with a backpack blower over the weekend. When he saw Dr Jenny Reichmann recently, amlodipine was discontinued because of low blood pressure.  The patient denies any recent chest pain, chest pressure, or shortness of breath.  Occasionally has some symptoms that he attributes to bloating and GERD.  No exertional symptoms are present.  No edema, orthopnea, or PND.  States that he has had some trouble with blood sugar control and has tried a few different medications under the direction of Dr. Jenny Reichmann.  He went to pick up Jardiance from the pharmacy recently and it was going to cost him nearly $500 because he is in the donut hole.  He did not fill the prescription.  Past Medical History:  Diagnosis Date   Adenomatous polyp of colon 05/2004   Allergic rhinitis    Anxiety    Aortic insufficiency 03/01/2022   Echocardiogram 02/2022: EF 60-65, no RWMA, Gr 2 DD, normal RVSF, mild to mod LAE, mild MR, mild AI, AV sclerosis w/o AS   BPH (benign  prostatic hyperplasia)    CAD (coronary artery disease)    s/p Cypher DES to LAD and pD1 2010;  LHC was done 6/12: EF 65%, circumflex patent, mid RCA 80-90% (small and nondominant), LAD and diagonal stents patent, LAD at the origin of the first diagonal 30%, ostial D2 90%, mid 80-90% (small vessel).  Continued medical therapy was recommended.    Carotid stenosis    dopplers 02/2012: 7-82% RICA; 95-62% LICA   Coronary artery disease involving native coronary artery of native heart with angina pectoris ([place]) 11/02/2008   Myoview 02/2022: EF 76, no ischemia or infarction; low risk     Diabetes type 2, controlled ([place])    Diverticulosis    colon   ED (erectile dysfunction)    Esophageal stricture    GERD (gastroesophageal reflux disease)    Hemorrhoids    Hiatal hernia    Hyperlipidemia    Hypertension    IBS (irritable bowel syndrome)    PVC's (premature ventricular contractions) 01/21/2020   Monitor 07/2019: PVC burden 21.6% // Echocardiogram 01/2020: EF 65-70, no RWMA, Gr 2 DD, normal RVSF, RVSP 43.3 mmHg (mod pul HTN), mild LAE, mild MR, trivial AI   Right inguinal hernia    s/p repair in 8/12   SVT (supraventricular tachycardia)     Past Surgical History:  Procedure Laterality Date  CARDIAC CATHETERIZATION N/A 12/10/2016   Procedure: Left Heart Cath and Coronary Angiography;  Surgeon: Jettie Booze, MD;  Location: [place] [facility];  Service: Cardiovascular;  Laterality: N/A;   CARDIAC CATHETERIZATION N/A 12/10/2016   Procedure: Coronary Balloon Angioplasty;  Surgeon: Jettie Booze, MD;  Location: [place] [facility];  Service: Cardiovascular;  Laterality: N/A;   CARDIAC CATHETERIZATION N/A 12/10/2016   Procedure: Intravascular Pressure Wire/FFR Study;  Surgeon: Jettie Booze, MD;  Location: [place] [facility];  Service: Cardiovascular;  Laterality: N/A;   CORONARY STENT PLACEMENT  10/2008   x 2   ELECTROPHYSIOLOGIC STUDY N/A 03/20/2015   no inducible SVT - Dr Irish Elders HERNIA REPAIR Right    RIH with ultrapro patch    Current Medications: Current Meds  Medication Sig   aspirin EC 81 MG tablet Take 81 mg by mouth daily after supper.   brimonidine (ALPHAGAN) 0.2 % ophthalmic solution Administer 1 drop to both eyes Three (3) times a day.   clonazePAM (KLONOPIN) 1 MG tablet Take 1 tablet (1 mg total) by mouth at bedtime as needed (sleep and restless leg).   finasteride (PROSCAR) 5 MG tablet TAKE 1 TABLET (5 MG TOTAL) BY MOUTH DAILY.   glipiZIDE (GLUCOTROL XL) 5 MG 24 hr tablet TAKE 1 TABLET EVERY DAY WITH BREAKFAST   isosorbide mononitrate (IMDUR) 30 MG 24 hr tablet Take 0.5 tablets (15 mg total) by mouth daily.   latanoprost (XALATAN) 0.005 % ophthalmic solution Place 1 drop into both eyes at bedtime.   losartan (COZAAR) 100 MG tablet TAKE 1 TABLET BY MOUTH EVERY DAY   meclizine (ANTIVERT) 12.5 MG tablet TAKE 1 TABLET BY MOUTH 3 TIMES DAILY AS NEEDED FOR DIZZINESS.   metFORMIN (GLUCOPHAGE-XR) 500 MG 24 hr tablet Take 3 tablets (1,500 mg total) by mouth daily with breakfast.   metoprolol succinate (TOPROL XL) 25 MG 24 hr tablet Take 0.5 tablets (12.5 mg total) by mouth daily.   nitroGLYCERIN (NITROSTAT) 0.4 MG SL tablet PLACE 1 TABLET UNDER THE TONGUE EVERY 5 MINUTES AS NEEDED FOR CHEST PAIN   omeprazole (PRILOSEC) 40 MG capsule Take 1 capsule (40 mg total) by mouth daily.   ondansetron (ZOFRAN-ODT) 4 MG disintegrating tablet Take 1 tablet (4 mg total) by mouth every 8 (eight) hours as needed for nausea or vomiting.   simvastatin (ZOCOR) 40 MG tablet TAKE 1 TABLET BY MOUTH EVERYDAY AT BEDTIME   triamcinolone cream (KENALOG) 0.1 % Apply 1 Application topically 2 (two) times daily.     Allergies:   Crestor [rosuvastatin calcium] and Lovastatin   Social History   Socioeconomic History   Marital status: Married    Spouse name: Not on file   Number of children: 3   Years of education: Not on file   Highest education level: Not on file   Occupational History   Occupation: Recruitment consultant for [employer]    Employer: RETIRED  Tobacco Use   Smoking status: Former    Types: Cigarettes    Quit date: 03/05/1956    Years since quitting: 66.6   Smokeless tobacco: Never   Tobacco comments:    smoked in teens  Vaping Use   Vaping Use: Never used  Substance and Sexual Activity   Alcohol use: No    Alcohol/week: 0.0 standard drinks of alcohol   Drug use: No   Sexual activity: Not on file  Other Topics Concern   Not on file  Social History Narrative  Lives with wife in a 3 story home.  Has 2 living children.  1 passed away in a car accident.     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     Social Determinants of Health   Financial Resource Strain: Low Risk  (05/17/2022)   Overall Financial Resource Strain (CARDIA)    Difficulty of Paying Living Expenses: Not hard at all  Food Insecurity: No Food Insecurity (05/17/2022)   Hunger Vital Sign    Worried About Running Out of Food in the Last Year: Never true    Ran Out of Food in the Last Year: Never true  Transportation Needs: No Transportation Needs (05/17/2022)   PRAPARE - Hydrologist (Medical): No    Lack of Transportation (Non-Medical): No  Physical Activity: Sufficiently Active (05/17/2022)   Exercise Vital Sign    Days of Exercise per Week: 5 days    Minutes of Exercise per Session: 30 min  Stress: No Stress Concern Present (05/17/2022)   [place]    Feeling of Stress : Not at all  Social Connections: [place] (05/17/2022)   Social Connection and Isolation Panel [NHANES]    Frequency of Communication with Friends and Family: More than three times a week    Frequency of Social Gatherings with Friends and Family: More than three times a week    Attends Religious Services: More than 4 times per year     Active Member of [employer] or Organizations: Yes    Attends Music therapist: More than 4 times per year    Marital Status: Married     Family History: The patient's family history includes Bladder Cancer (age of onset: 32) in his mother; Healthy in his daughter and son; Heart attack (age of onset: 68) in his father; Pancreatic cancer (age of onset: 53) in his sister. There is no history of Colon cancer.  ROS:   Please see the history of present illness.    All other systems reviewed and are negative.  EKGs/Labs/Other Studies Reviewed:    The following studies were reviewed today: Myoview stress test 03/01/2022:   The study is normal. The study is low risk.   No ST deviation was noted.   LV perfusion is normal. There is no evidence of ischemia. There is no evidence of infarction.   Left ventricular function is normal. Nuclear stress EF: 76 %. End diastolic cavity size is normal. End systolic cavity size is normal.   Prior study available for comparison from 12/09/2016.  EKG:  EKG is not ordered today.    Recent Labs: 02/11/2022: NT-Pro BNP 393 02/28/2022: Hemoglobin 14.1; Platelets 171.0; TSH 1.93 08/22/2022: ALT 14; BUN 20; Creatinine, Ser 1.14; Potassium 4.2; Sodium 140  Recent Lipid Panel    Component Value Date/Time   CHOL 125 08/22/2022 1419   TRIG 179.0 (H) 08/22/2022 1419   TRIG 128 11/19/2006 1032   HDL 35.30 (L) 08/22/2022 1419   CHOLHDL 4 08/22/2022 1419   VLDL 35.8 08/22/2022 1419   LDLCALC 54 08/22/2022 1419   LDLCALC 79 05/29/2020 1443   LDLDIRECT 82.0 05/19/2018 1058     Risk Assessment/Calculations:                Physical Exam:    VS:  BP 138/70   Pulse (!) 56   Ht '5\' 10"'$  (1.778 m)  Wt 161 lb (73 kg)   BMI 23.10 kg/m     Wt Readings from Last 3 Encounters:  10/02/22 161 lb (73 kg)  09/27/22 162 lb (73.5 kg)  08/22/22 156 lb (70.8 kg)     GEN:  Well nourished, well developed pleasant elderly male in no acute distress HEENT:  Normal NECK: No JVD; No carotid bruits LYMPHATICS: No lymphadenopathy CARDIAC: RRR, no murmurs, rubs, gallops RESPIRATORY:  Clear to auscultation without rales, wheezing or rhonchi  ABDOMEN: Soft, non-tender, non-distended MUSCULOSKELETAL:  No edema; No deformity  SKIN: Warm and dry NEUROLOGIC:  Alert and oriented x 3 PSYCHIATRIC:  Normal affect   ASSESSMENT:    1. Coronary artery disease involving native coronary artery of native heart with angina pectoris ([place])   2. Hyperlipidemia LDL goal <70   3. Essential hypertension    PLAN:    In order of problems listed above:  The patient is doing well with no recurrence of angina.  His Myoview scan from earlier this year is reviewed and shows no evidence of ischemia.  He will continue on aspirin for antiplatelet therapy, simvastatin for lipid lowering, metoprolol succinate, and losartan.  No changes are made today.  I will see him back in 1 year.  He will have an APP visit in 6 months.  Antianginal regimen includes isosorbide and metoprolol succinate. Last lipids recently checked show a cholesterol 125 and an LDL of 54.  Continue current therapy. Blood pressure is well controlled.  Amlodipine recently discontinued because of low normal blood pressure.  Agree with continuing his current medical program.           Medication Adjustments/Labs and Tests Ordered: Current medicines are reviewed at length with the patient today.  Concerns regarding medicines are outlined above.  No orders of the defined types were placed in this encounter.  No orders of the defined types were placed in this encounter.   Patient Instructions  Medication Instructions:  Your physician recommends that you continue on your current medications as directed. Please refer to the Current Medication list given to you today.  *If you need a refill on your cardiac medications before your next appointment, please call your pharmacy*   Lab Work: NONE If you have  labs (blood work) drawn today and your tests are completely normal, you will receive your results only by: [place] (if you have MyChart) OR A paper copy in the mail If you have any lab test that is abnormal or we need to change your treatment, we will call you to review the results.   Testing/Procedures: NONE   Follow-Up: At [facility], you and your health needs are our priority.  As part of our continuing mission to provide you with exceptional heart care, we have created designated Provider Care Teams.  These Care Teams include your primary Cardiologist (physician) and Advanced Practice Providers (APPs -  Physician Assistants and Nurse Practitioners) who all work together to provide you with the care you need, when you need it.  We recommend signing up for the patient portal called "MyChart".  Sign up information is provided on this After Visit Summary.  MyChart is used to connect with patients for Virtual Visits (Telemedicine).  Patients are able to view lab/test results, encounter notes, upcoming appointments, etc.  Non-urgent messages can be sent to your provider as well.   To learn more about what you can do with MyChart, go to [email or web address].    Your next appointment:  6 month(s)  The format for your next appointment:   In Person  Provider:   Nicholes Rough, PA-C, Melina Copa, PA-C, Ambrose Pancoast, NP, Ermalinda Barrios, PA-C, Christen Bame, NP, or Richardson Dopp, PA-C     Then, Sherren Mocha, MD will plan to see you again in 1 year(s).      Important Information About Sugar         Signed, Sherren Mocha, MD  10/02/2022 5:14 PM    [place]

## 2022-10-02 NOTE — Patient Instructions (Signed)
Medication Instructions:  Your physician recommends that you continue on your current medications as directed. Please refer to the Current Medication list given to you today.  *If you need a refill on your cardiac medications before your next appointment, please call your pharmacy*   Lab Work: NONE If you have labs (blood work) drawn today and your tests are completely normal, you will receive your results only by: [place] (if you have MyChart) OR A paper copy in the mail If you have any lab test that is abnormal or we need to change your treatment, we will call you to review the results.   Testing/Procedures: NONE   Follow-Up: At [facility], you and your health needs are our priority.  As part of our continuing mission to provide you with exceptional heart care, we have created designated Provider Care Teams.  These Care Teams include your primary Cardiologist (physician) and Advanced Practice Providers (APPs -  Physician Assistants and Nurse Practitioners) who all work together to provide you with the care you need, when you need it.  We recommend signing up for the patient portal called "MyChart".  Sign up information is provided on this After Visit Summary.  MyChart is used to connect with patients for Virtual Visits (Telemedicine).  Patients are able to view lab/test results, encounter notes, upcoming appointments, etc.  Non-urgent messages can be sent to your provider as well.   To learn more about what you can do with MyChart, go to [email or web address].    Your next appointment:   6 month(s)  The format for your next appointment:   In Person  Provider:   Nicholes Rough, PA-C, Melina Copa, PA-C, Ambrose Pancoast, NP, Ermalinda Barrios, PA-C, Christen Bame, NP, or Richardson Dopp, PA-C     Then, Sherren Mocha, MD will plan to see you again in 1 year(s).      Important Information About Sugar

## 2022-10-19 ENCOUNTER — Other Ambulatory Visit: Payer: Self-pay | Admitting: Internal Medicine

## 2022-10-19 NOTE — Telephone Encounter (Signed)
Please refill as per office routine med refill policy (all routine meds to be refilled for 3 mo or monthly (per pt preference) up to one year from last visit, then month to month grace period for 3 mo, then further med refills will have to be denied) ? ?

## 2022-11-26 ENCOUNTER — Telehealth: Payer: Self-pay | Admitting: Internal Medicine

## 2022-11-26 NOTE — Telephone Encounter (Signed)
Spoke with pharmacy and was able to confirm Rx for Jardiance sent in November. Rx will be processed for pick up, patient notified

## 2022-11-26 NOTE — Telephone Encounter (Signed)
Patient needs a new copy of his [facility]ardiance Prescription [facility] [place], [place]  They can not find the one that was sent in November.  Patient's # [phone]

## 2022-11-28 ENCOUNTER — Ambulatory Visit (INDEPENDENT_AMBULATORY_CARE_PROVIDER_SITE_OTHER): Payer: Medicare HMO | Admitting: Internal Medicine

## 2022-11-28 ENCOUNTER — Encounter: Payer: Self-pay | Admitting: Internal Medicine

## 2022-11-28 VITALS — BP 126/64 | HR 78 | Temp 98.0°F | Ht 70.0 in | Wt 157.0 lb

## 2022-11-28 DIAGNOSIS — J029 Acute pharyngitis, unspecified: Secondary | ICD-10-CM | POA: Diagnosis not present

## 2022-11-28 DIAGNOSIS — I10 Essential (primary) hypertension: Secondary | ICD-10-CM | POA: Diagnosis not present

## 2022-11-28 DIAGNOSIS — E559 Vitamin D deficiency, unspecified: Secondary | ICD-10-CM | POA: Diagnosis not present

## 2022-11-28 DIAGNOSIS — E1159 Type 2 diabetes mellitus with other circulatory complications: Secondary | ICD-10-CM | POA: Diagnosis not present

## 2022-11-28 LAB — POCT RESPIRATORY SYNCYTIAL VIRUS: RSV Rapid Ag: NEGATIVE

## 2022-11-28 LAB — POC SOFIA SARS ANTIGEN FIA: SARS Coronavirus 2 Ag: NEGATIVE

## 2022-11-28 LAB — POC INFLUENZA A&B (BINAX/QUICKVUE)
Influenza A, POC: NEGATIVE
Influenza B, POC: NEGATIVE

## 2022-11-28 MED ORDER — DOXYCYCLINE HYCLATE 100 MG PO TABS: 100.0000 mg | ORAL_TABLET | Freq: Two times a day (BID) | ORAL | 0 refills | Status: DC

## 2022-11-28 MED ORDER — HYDROCODONE BIT-HOMATROP MBR 5-1.5 MG/5ML PO SOLN: 5.0000 mL | Freq: Four times a day (QID) | ORAL | 0 refills | Status: AC | PRN

## 2022-11-28 NOTE — Assessment & Plan Note (Signed)
Lab Results  Component Value Date   HGBA1C 8.3 (H) 08/22/2022   Uncontrolled recently, but pt to continue current medical treatment jardiance 10 qd, glipizid eER 5 qd and metformin ER 500 gm - 3 qd as declines further A1c or med change today

## 2022-11-28 NOTE — Patient Instructions (Signed)
Please take all new medication as prescribed - the antibiotic, and cough medicine  Your COVID, Flu and RSV testing is negative  Please continue all other medications as before, and refills have been done if requested.  Please have the pharmacy call with any other refills you may need.  Please keep your appointments with your specialists as you may have planned

## 2022-11-28 NOTE — Progress Notes (Signed)
Patient ID: Keith Herrera, male   DOB: [birth year], 89 y.o.   MRN: [id number]        Chief Complaint: follow up severe ST, htn, dm, low vit d       HPI:  Keith Herrera is a 89 y.o. male here with c/o 2 days onset severe ST with a swelling feeling and pain with swallowing, but no difficulty with breathing or other dysphagia.  Denies fever, chills but has some HA and fatigue.  Pt denies chest pain, increased sob or doe, wheezing, orthopnea, PND, increased LE swelling, palpitations, dizziness or syncope.   Pt denies polydipsia, polyuria, or new focal neuro s/s.    Pt denies wt loss, night sweats, loss of appetite, or other constitutional symptoms        Wt Readings from Last 3 Encounters:  11/28/22 157 lb (71.2 kg)  10/02/22 161 lb (73 kg)  09/27/22 162 lb (73.5 kg)   BP Readings from Last 3 Encounters:  11/28/22 126/64  10/02/22 138/70  09/27/22 122/76         Past Medical History:  Diagnosis Date   Adenomatous polyp of colon 05/2004   Allergic rhinitis    Anxiety    Aortic insufficiency 03/01/2022   Echocardiogram 02/2022: EF 60-65, no RWMA, Gr 2 DD, normal RVSF, mild to mod LAE, mild MR, mild AI, AV sclerosis w/o AS   BPH (benign prostatic hyperplasia)    CAD (coronary artery disease)    s/p Cypher DES to LAD and pD1 2010;  LHC was done 6/12: EF 65%, circumflex patent, mid RCA 80-90% (small and nondominant), LAD and diagonal stents patent, LAD at the origin of the first diagonal 30%, ostial D2 90%, mid 80-90% (small vessel).  Continued medical therapy was recommended.    Carotid stenosis    dopplers 02/2012: 3-71% RICA; 69-67% LICA   Coronary artery disease involving native coronary artery of native heart with angina pectoris ([place]) 11/02/2008   Myoview 02/2022: EF 76, no ischemia or infarction; low risk     Diabetes type 2, controlled ([place])    Diverticulosis    colon   ED (erectile dysfunction)    Esophageal stricture    GERD (gastroesophageal reflux disease)    Hemorrhoids     Hiatal hernia    Hyperlipidemia    Hypertension    IBS (irritable bowel syndrome)    PVC's (premature ventricular contractions) 01/21/2020   Monitor 07/2019: PVC burden 21.6% // Echocardiogram 01/2020: EF 65-70, no RWMA, Gr 2 DD, normal RVSF, RVSP 43.3 mmHg (mod pul HTN), mild LAE, mild MR, trivial AI   Right inguinal hernia    s/p repair in 8/12   SVT (supraventricular tachycardia)    Past Surgical History:  Procedure Laterality Date   CARDIAC CATHETERIZATION N/A 12/10/2016   Procedure: Left Heart Cath and Coronary Angiography;  Surgeon: Jettie Booze, MD;  Location: [facility];  Service: Cardiovascular;  Laterality: N/A;   CARDIAC CATHETERIZATION N/A 12/10/2016   Procedure: Coronary Balloon Angioplasty;  Surgeon: Jettie Booze, MD;  Location: [place];  Service: Cardiovascular;  Laterality: N/A;   CARDIAC CATHETERIZATION N/A 12/10/2016   Procedure: Intravascular Pressure Wire/FFR Study;  Surgeon: Jettie Booze, MD;  Location: [facility];  Service: Cardiovascular;  Laterality: N/A;   CORONARY STENT PLACEMENT  10/2008   x 2   ELECTROPHYSIOLOGIC STUDY N/A 03/20/2015   no inducible SVT - Dr Irish Elders HERNIA REPAIR Right  RIH with ultrapro patch    reports that he quit smoking about 66 years ago. His smoking use included cigarettes. He has never used smokeless tobacco. He reports that he does not drink alcohol and does not use drugs. family history includes Bladder Cancer (age of onset: 7) in his mother; Healthy in his daughter and son; Heart attack (age of onset: 26) in his father; Pancreatic cancer (age of onset: 19) in his sister. Allergies  Allergen Reactions   Crestor [Rosuvastatin Calcium] Rash    All over body   Lovastatin Itching and Rash   Current Outpatient Medications on File Prior to Visit  Medication Sig Dispense Refill   ACCU-CHEK AVIVA PLUS test strip USE FOR TESTING UP TO 4 TIMES DAILY AS DIRECTED 100 strip 2   aspirin EC  81 MG tablet Take 81 mg by mouth daily after supper.     blood glucose meter kit and supplies Dispense based on patient and insurance preference. Use up to four times daily as directed. (FOR ICD-10 E10.9, E11.9). 1 each 0   Blood Glucose Monitoring Suppl (ONE TOUCH ULTRA 2) w/Device KIT Use as directed E 11.9 1 each 0   brimonidine (ALPHAGAN) 0.2 % ophthalmic solution Administer 1 drop to both eyes Three (3) times a day.     clonazePAM (KLONOPIN) 1 MG tablet Take 1 tablet (1 mg total) by mouth at bedtime as needed (sleep and restless leg). 30 tablet 5   empagliflozin (JARDIANCE) 10 MG TABS tablet Take 1 tablet (10 mg total) by mouth daily. 90 tablet 3   finasteride (PROSCAR) 5 MG tablet TAKE 1 TABLET (5 MG TOTAL) BY MOUTH DAILY. 90 tablet 3   glipiZIDE (GLUCOTROL XL) 5 MG 24 hr tablet TAKE 1 TABLET EVERY DAY WITH BREAKFAST 90 tablet 3   isosorbide mononitrate (IMDUR) 30 MG 24 hr tablet Take 0.5 tablets (15 mg total) by mouth daily. 45 tablet 3   Lancets MISC Use as directed twice daily E11.9 200 each 3   latanoprost (XALATAN) 0.005 % ophthalmic solution Place 1 drop into both eyes at bedtime.  12   losartan (COZAAR) 100 MG tablet TAKE 1 TABLET BY MOUTH EVERY DAY 90 tablet 3   meclizine (ANTIVERT) 12.5 MG tablet TAKE 1 TABLET BY MOUTH 3 TIMES DAILY AS NEEDED FOR DIZZINESS. 90 tablet 0   metFORMIN (GLUCOPHAGE-XR) 500 MG 24 hr tablet Take 3 tablets (1,500 mg total) by mouth daily with breakfast. 270 tablet 3   metoprolol succinate (TOPROL XL) 25 MG 24 hr tablet Take 0.5 tablets (12.5 mg total) by mouth daily. 45 tablet 3   nitroGLYCERIN (NITROSTAT) 0.4 MG SL tablet PLACE 1 TABLET UNDER THE TONGUE EVERY 5 MINUTES AS NEEDED FOR CHEST PAIN 25 tablet 2   omeprazole (PRILOSEC) 40 MG capsule Take 1 capsule (40 mg total) by mouth daily. 90 capsule 3   ondansetron (ZOFRAN-ODT) 4 MG disintegrating tablet Take 1 tablet (4 mg total) by mouth every 8 (eight) hours as needed for nausea or vomiting. 30 tablet 1    simvastatin (ZOCOR) 40 MG tablet TAKE 1 TABLET BY MOUTH EVERYDAY AT BEDTIME 90 tablet 3   triamcinolone cream (KENALOG) 0.1 % Apply 1 Application topically 2 (two) times daily. 454 g 2   No current facility-administered medications on file prior to visit.        ROS:  All others reviewed and negative.  Objective        PE:  BP 126/64 (BP Location: Left Arm, Patient Position: Sitting,  Cuff Size: Normal)   Pulse 78   Temp 98 F (36.7 C) (Other (Comment))   Ht '5\' 10"'$  (1.778 m)   Wt 157 lb (71.2 kg)   SpO2 94%   BMI 22.53 kg/m                 Constitutional: Pt appears in NAD               HENT: Head: NCAT.                Right Ear: External ear normal.                 Left Ear: External ear normal.                Eyes: . Pupils are equal, round, and reactive to light. Conjunctivae and EOM are normal; pharynx with severe redness, mild swelling and large gray exudate               Nose: without d/c or deformity               Neck: Neck supple. Gross normal ROM               Cardiovascular: Normal rate and regular rhythm.                 Pulmonary/Chest: Effort normal and breath sounds without rales or wheezing.                Abd:  Soft, NT, ND, + BS, no organomegaly               Neurological: Pt is alert. At baseline orientation, motor grossly intact               Skin: Skin is warm. No rashes, no other new lesions, LE edema - none               Psychiatric: Pt behavior is normal without agitation   Micro: none  Cardiac tracings I have personally interpreted today:  none  Pertinent Radiological findings (summarize): none   Lab Results  Component Value Date   WBC 6.7 02/28/2022   HGB 14.1 02/28/2022   HCT 42.1 02/28/2022   PLT 171.0 02/28/2022   GLUCOSE 147 (H) 08/22/2022   CHOL 125 08/22/2022   TRIG 179.0 (H) 08/22/2022   HDL 35.30 (L) 08/22/2022   LDLDIRECT 82.0 05/19/2018   LDLCALC 54 08/22/2022   ALT 14 08/22/2022   AST 15 08/22/2022   NA 140 08/22/2022   K 4.2  08/22/2022   CL 105 08/22/2022   CREATININE 1.14 08/22/2022   BUN 20 08/22/2022   CO2 28 08/22/2022   TSH 1.93 02/28/2022   PSA 2.49 12/21/2012   INR 1.15 12/10/2016   HGBA1C 8.3 (H) 08/22/2022   MICROALBUR 3.5 (H) 02/28/2022   POCT - COVID - neg, Flu A/B - neg, RSV - neg  Assessment/Plan:  Keith Herrera is a 89 y.o. White or Caucasian [1] male with  has a past medical history of Adenomatous polyp of colon (05/2004), Allergic rhinitis, Anxiety, Aortic insufficiency (03/01/2022), BPH (benign prostatic hyperplasia), CAD (coronary artery disease), Carotid stenosis, Coronary artery disease involving native coronary artery of native heart with angina pectoris ([place]) (11/02/2008), Diabetes type 2, controlled ([place]), Diverticulosis, ED (erectile dysfunction), Esophageal stricture, GERD (gastroesophageal reflux disease), Hemorrhoids, Hiatal hernia, Hyperlipidemia, Hypertension, IBS (irritable bowel syndrome), PVC's (premature ventricular contractions) (01/21/2020), Right inguinal hernia, and SVT (supraventricular tachycardia).  Essential  hypertension BP Readings from Last 3 Encounters:  11/28/22 126/64  10/02/22 138/70  09/27/22 122/76   Stable, pt to continue medical treatment losartan 100 mg qd, toprol xl 25 mg - 1/2 qd   Sore throat Very severe, likely strep, for doxycycline 100 bid, cough med prn,  to f/u any worsening symptoms or concerns  Type 2 diabetes mellitus with vascular disease ([place]) Lab Results  Component Value Date   HGBA1C 8.3 (H) 08/22/2022   Uncontrolled recently, but pt to continue current medical treatment jardiance 10 qd, glipizid eER 5 qd and metformin ER 500 gm - 3 qd as declines further A1c or med change today   Vitamin D deficiency Last vitamin D Lab Results  Component Value Date   VD25OH 30.36 02/28/2022   Low, reminded to start oral replacement  Followup: Return if symptoms worsen or fail to improve.  Cathlean Cower, MD 11/28/2022 8:18 PM [facility]

## 2022-11-28 NOTE — Assessment & Plan Note (Signed)
Last vitamin D Lab Results  Component Value Date   VD25OH 30.36 02/28/2022   Low, reminded to start oral replacement

## 2022-11-28 NOTE — Assessment & Plan Note (Signed)
Very severe, likely strep, for doxycycline 100 bid, cough med prn,  to f/u any worsening symptoms or concerns

## 2022-11-28 NOTE — Assessment & Plan Note (Signed)
BP Readings from Last 3 Encounters:  11/28/22 126/64  10/02/22 138/70  09/27/22 122/76   Stable, pt to continue medical treatment losartan 100 mg qd, toprol xl 25 mg - 1/2 qd

## 2022-11-29 ENCOUNTER — Encounter: Payer: Self-pay | Admitting: Internal Medicine

## 2022-11-29 ENCOUNTER — Telehealth: Payer: Self-pay | Admitting: Internal Medicine

## 2022-11-29 DIAGNOSIS — D496 Neoplasm of unspecified behavior of brain: Secondary | ICD-10-CM

## 2022-11-29 NOTE — Telephone Encounter (Signed)
PT calls today in regards to prescriptions from recent visit. PT states that [id number] [place] [place] know that they did not receive the RX for HYDROcodone bit-homatropine (HYCODAN) 5-1.5 MG/5ML syrup or doxycycline (VIBRA-TABS) 100 MG tablet .   [facility] to [facility] [address] [place] [address] [place]: [phone]

## 2022-12-01 ENCOUNTER — Encounter: Payer: Self-pay | Admitting: Internal Medicine

## 2022-12-01 NOTE — Progress Notes (Unsigned)
None  - error

## 2022-12-01 NOTE — Progress Notes (Signed)
Error. Pt not seen

## 2022-12-01 NOTE — Progress Notes (Deleted)
Error - pt not seen        Chief Complaint: error

## 2022-12-01 NOTE — Patient Instructions (Signed)
error 

## 2022-12-01 NOTE — Progress Notes (Deleted)
Please to sign off on any procedure done  The EMR says I cannot close this

## 2022-12-01 NOTE — Assessment & Plan Note (Signed)
Error - pt not seen

## 2022-12-04 NOTE — Telephone Encounter (Signed)
error 

## 2022-12-06 ENCOUNTER — Ambulatory Visit: Payer: Medicare HMO | Admitting: Internal Medicine

## 2023-01-16 ENCOUNTER — Other Ambulatory Visit: Payer: Self-pay | Admitting: Physician Assistant

## 2023-02-21 ENCOUNTER — Ambulatory Visit: Payer: Medicare HMO | Admitting: Internal Medicine

## 2023-03-04 ENCOUNTER — Ambulatory Visit: Payer: Medicare HMO | Admitting: Internal Medicine

## 2023-03-07 ENCOUNTER — Encounter: Payer: Self-pay | Admitting: Internal Medicine

## 2023-03-07 ENCOUNTER — Other Ambulatory Visit: Payer: Self-pay | Admitting: Internal Medicine

## 2023-03-07 ENCOUNTER — Ambulatory Visit (INDEPENDENT_AMBULATORY_CARE_PROVIDER_SITE_OTHER): Payer: Medicare HMO | Admitting: Internal Medicine

## 2023-03-07 VITALS — BP 120/76 | HR 65 | Temp 98.0°F | Ht 70.0 in | Wt 150.0 lb

## 2023-03-07 DIAGNOSIS — E1159 Type 2 diabetes mellitus with other circulatory complications: Secondary | ICD-10-CM

## 2023-03-07 DIAGNOSIS — E785 Hyperlipidemia, unspecified: Secondary | ICD-10-CM | POA: Diagnosis not present

## 2023-03-07 DIAGNOSIS — I10 Essential (primary) hypertension: Secondary | ICD-10-CM

## 2023-03-07 DIAGNOSIS — E538 Deficiency of other specified B group vitamins: Secondary | ICD-10-CM | POA: Diagnosis not present

## 2023-03-07 DIAGNOSIS — J309 Allergic rhinitis, unspecified: Secondary | ICD-10-CM

## 2023-03-07 DIAGNOSIS — Z0001 Encounter for general adult medical examination with abnormal findings: Secondary | ICD-10-CM

## 2023-03-07 DIAGNOSIS — E559 Vitamin D deficiency, unspecified: Secondary | ICD-10-CM | POA: Diagnosis not present

## 2023-03-07 DIAGNOSIS — T7840XA Allergy, unspecified, initial encounter: Secondary | ICD-10-CM

## 2023-03-07 DIAGNOSIS — G2581 Restless legs syndrome: Secondary | ICD-10-CM

## 2023-03-07 DIAGNOSIS — I700 Atherosclerosis of aorta: Secondary | ICD-10-CM

## 2023-03-07 LAB — URINALYSIS, ROUTINE W REFLEX MICROSCOPIC
Bilirubin Urine: NEGATIVE
Hgb urine dipstick: NEGATIVE
Ketones, ur: NEGATIVE
Leukocytes,Ua: NEGATIVE
Nitrite: NEGATIVE
Specific Gravity, Urine: 1.015 (ref 1.000–1.030)
Total Protein, Urine: NEGATIVE
Urine Glucose: >=1000 — AB
Urobilinogen, UA: 0.2 (ref 0.0–1.0)
WBC, UA: NONE SEEN (ref 0–?)
pH: 6 (ref 5.0–8.0)

## 2023-03-07 LAB — MICROALBUMIN / CREATININE URINE RATIO
Creatinine,U: 73 mg/dL
Microalb Creat Ratio: 1.8 mg/g (ref 0.0–30.0)
Microalb, Ur: 1.3 mg/dL (ref 0.0–1.9)

## 2023-03-07 LAB — BASIC METABOLIC PANEL
BUN: 21 mg/dL (ref 6–23)
CO2: 29 mEq/L (ref 19–32)
Calcium: 9.3 mg/dL (ref 8.4–10.5)
Chloride: 104 mEq/L (ref 96–112)
Creatinine, Ser: 1.16 mg/dL (ref 0.40–1.50)
GFR: 56.05 mL/min — ABNORMAL LOW (ref >60.00)
Glucose, Bld: 176 mg/dL — ABNORMAL HIGH (ref 70–99)
Potassium: 4.5 mEq/L (ref 3.5–5.1)
Sodium: 140 mEq/L (ref 135–145)

## 2023-03-07 LAB — HEPATIC FUNCTION PANEL
ALT: 16 U/L (ref 0–53)
AST: 14 U/L (ref 0–37)
Albumin: 4 g/dL (ref 3.5–5.2)
Alkaline Phosphatase: 53 U/L (ref 39–117)
Bilirubin, Direct: 0.1 mg/dL (ref 0.0–0.3)
Total Bilirubin: 0.5 mg/dL (ref 0.2–1.2)
Total Protein: 7 g/dL (ref 6.0–8.3)

## 2023-03-07 LAB — CBC WITH DIFFERENTIAL/PLATELET
Basophils Absolute: 0 10*3/uL (ref 0.0–0.1)
Basophils Relative: 0.4 % (ref 0.0–3.0)
Eosinophils Absolute: 0.1 10*3/uL (ref 0.0–0.7)
Eosinophils Relative: 2.2 % (ref 0.0–5.0)
HCT: 48.3 % (ref 39.0–52.0)
Hemoglobin: 16.1 g/dL (ref 13.0–17.0)
Lymphocytes Relative: 19.4 % (ref 12.0–46.0)
Lymphs Abs: 1.3 10*3/uL (ref 0.7–4.0)
MCHC: 33.3 g/dL (ref 30.0–36.0)
MCV: 89 fl (ref 78.0–100.0)
Monocytes Absolute: 0.5 10*3/uL (ref 0.1–1.0)
Monocytes Relative: 8.2 % (ref 3.0–12.0)
Neutro Abs: 4.6 10*3/uL (ref 1.4–7.7)
Neutrophils Relative %: 69.8 % (ref 43.0–77.0)
Platelets: 158 10*3/uL (ref 150.0–400.0)
RBC: 5.43 Mil/uL (ref 4.22–5.81)
RDW: 14.5 % (ref 11.5–15.5)
WBC: 6.5 10*3/uL (ref 4.0–10.5)

## 2023-03-07 LAB — LIPID PANEL
Cholesterol: 119 mg/dL (ref 0–200)
HDL: 28.8 mg/dL — ABNORMAL LOW (ref >39.00)
NonHDL: 90.56
Total CHOL/HDL Ratio: 4
Triglycerides: 224 mg/dL — ABNORMAL HIGH (ref 0.0–149.0)
VLDL: 44.8 mg/dL — ABNORMAL HIGH (ref 0.0–40.0)

## 2023-03-07 LAB — LDL CHOLESTEROL, DIRECT: Direct LDL: 65 mg/dL

## 2023-03-07 LAB — HEMOGLOBIN A1C: Hgb A1c MFr Bld: 8.4 % — ABNORMAL HIGH (ref 4.6–6.5)

## 2023-03-07 LAB — VITAMIN D 25 HYDROXY (VIT D DEFICIENCY, FRACTURES): VITD: 22.8 ng/mL — ABNORMAL LOW (ref 30.00–100.00)

## 2023-03-07 LAB — VITAMIN B12: Vitamin B-12: 643 pg/mL (ref 211–911)

## 2023-03-07 LAB — TSH: TSH: 2.24 u[IU]/mL (ref 0.35–5.50)

## 2023-03-07 MED ORDER — GABAPENTIN 300 MG PO CAPS: 300.0000 mg | ORAL_CAPSULE | Freq: Every day | ORAL | 1 refills | Status: DC

## 2023-03-07 MED ORDER — EMPAGLIFLOZIN 25 MG PO TABS: 25.0000 mg | ORAL_TABLET | Freq: Every day | ORAL | 3 refills | Status: AC

## 2023-03-07 MED ORDER — METHYLPREDNISOLONE ACETATE 80 MG/ML IJ SUSP
80.0000 mg | Freq: Once | INTRAMUSCULAR | Status: AC
Start: 2023-03-07 — End: 2023-03-07
  Administered 2023-03-07: 80 mg via INTRAMUSCULAR

## 2023-03-07 MED ORDER — EMPAGLIFLOZIN 25 MG PO TABS: 25.0000 mg | ORAL_TABLET | Freq: Every day | ORAL | 3 refills | Status: DC

## 2023-03-07 MED ORDER — PREDNISONE 10 MG PO TABS: ORAL_TABLET | ORAL | 0 refills | Status: DC

## 2023-03-07 NOTE — Patient Instructions (Addendum)
Please have your Shingrix (shingles) shots done at your local pharmacy.  You had the steroid shot today  Please take all new medication as prescribed - the prednisone, and the gabapentin  Ok to stop the clonazepam  Please continue all other medications as before, and refills have been done if requested.  Please have the pharmacy call with any other refills you may need.  Please continue your efforts at being more active, low cholesterol diet, and weight control.  You are otherwise up to date with prevention measures today.  Please keep your appointments with your specialists as you may have planned  Please go to the LAB at the blood drawing area for the tests to be done  You will be contacted by phone if any changes need to be made immediately.  Otherwise, you will receive a letter about your results with an explanation, but please check with MyChart first.  Please remember to sign up for MyChart if you have not done so, as this will be important to you in the future with finding out test results, communicating by private email, and scheduling acute appointments online when needed.  Please make an Appointment to return in 6 months, or sooner if needed

## 2023-03-07 NOTE — Progress Notes (Unsigned)
Patient ID: Keith Herrera, male   DOB: 1932/01/05, 89 y.o.   MRN: [id number]         Chief Complaint:: wellness exam and Medical Management of Chronic Issues (Follow up and been stopped up due to seasonal allergies ) , allergies, aortic atherosclerosis, htn, hld, RLS worsening, low vit d       HPI:  Keith Herrera is a 89 y.o. male here for wellness exam; for shingrix at pharmacy, o/w up to date                        Also Pt denies chest pain, increased sob or doe, wheezing, orthopnea, PND, increased LE swelling, palpitations, dizziness or syncope.   Pt denies polydipsia, polyuria, or new focal neuro s/s except has worsening jumpy legs to getting to sleep, and klonopin no longer working well enough. Does have several wks ongoing nasal allergy symptoms with clearish congestion, itch and sneezing, without fever, pain, ST, cough, swelling or wheezing.   Pt denies fever, wt loss, night sweats, loss of appetite, or other constitutional symptoms  Lost 10 lbs since nov 2023 due to wife prlonged illness and changed diet, wife to come home next wk likely.   Wt Readings from Last 3 Encounters:  03/07/23 150 lb (68 kg)  11/28/22 157 lb (71.2 kg)  10/02/22 161 lb (73 kg)   BP Readings from Last 3 Encounters:  03/07/23 120/76  12/01/22 120/70  11/28/22 126/64   Immunization History  Administered Date(s) Administered   COVID-19, mRNA, vaccine(Comirnaty)12 years and older 09/12/2022   Fluad Quad(high Dose 65+) 07/19/2019, 09/21/2020, 08/22/2022   H1N1 10/28/2008   Influenza Whole 11/30/2007, 11/18/2009   Influenza, High Dose Seasonal PF 09/21/2013, 10/04/2014, 08/19/2017   Influenza-Unspecified 06/21/2016, 08/19/2018   Moderna Sars-Covid-2 Vaccination 12/01/2019, 12/29/2019, 11/02/2020   Pneumococcal Conjugate-13 10/05/2013   Pneumococcal Polysaccharide-23 11/30/2007   Td 03/23/1998, 10/28/2008   Tdap 11/25/2018   There are no preventive care reminders to display for this patient.     Past  Medical History:  Diagnosis Date   Adenomatous polyp of colon 05/2004   Allergic rhinitis    Anxiety    Aortic insufficiency 03/01/2022   Echocardiogram 02/2022: EF 60-65, no RWMA, Gr 2 DD, normal RVSF, mild to mod LAE, mild MR, mild AI, AV sclerosis w/o AS   BPH (benign prostatic hyperplasia)    CAD (coronary artery disease)    s/p Cypher DES to LAD and pD1 2010;  LHC was done 6/12: EF 65%, circumflex patent, mid RCA 80-90% (small and nondominant), LAD and diagonal stents patent, LAD at the origin of the first diagonal 30%, ostial D2 90%, mid 80-90% (small vessel).  Continued medical therapy was recommended.    Carotid stenosis    dopplers 02/2012: 0-39% RICA; 40-59% LICA   Coronary artery disease involving native coronary artery of native heart with angina pectoris 11/02/2008   Myoview 02/2022: EF 76, no ischemia or infarction; low risk     Diabetes type 2, controlled    Diverticulosis    colon   ED (erectile dysfunction)    Esophageal stricture    GERD (gastroesophageal reflux disease)    Hemorrhoids    Hiatal hernia    Hyperlipidemia    Hypertension    IBS (irritable bowel syndrome)    PVC's (premature ventricular contractions) 01/21/2020   Monitor 07/2019: PVC burden 21.6% // Echocardiogram 01/2020: EF 65-70, no RWMA, Gr 2 DD, normal RVSF, RVSP 43.3 mmHg (mod  pul HTN), mild LAE, mild MR, trivial AI   Right inguinal hernia    s/p repair in 8/12   SVT (supraventricular tachycardia)    Past Surgical History:  Procedure Laterality Date   CARDIAC CATHETERIZATION N/A 12/10/2016   Procedure: Left Heart Cath and Coronary Angiography;  Surgeon: [name], MD;  Location: [facility] INVASIVE CV LAB;  Service: Cardiovascular;  Laterality: N/A;   CARDIAC CATHETERIZATION N/A 12/10/2016   Procedure: Coronary Balloon Angioplasty;  Surgeon: [name], MD;  Location: [facility] INVASIVE CV LAB;  Service: Cardiovascular;  Laterality: N/A;   CARDIAC CATHETERIZATION N/A 12/10/2016   Procedure:  Intravascular Pressure Wire/FFR Study;  Surgeon: [name], MD;  Location: [facility] INVASIVE CV LAB;  Service: Cardiovascular;  Laterality: N/A;   CORONARY STENT PLACEMENT  10/2008   x 2   ELECTROPHYSIOLOGIC STUDY N/A 03/20/2015   no inducible SVT - Dr [name] HERNIA REPAIR Right    RIH with ultrapro patch    reports that he quit smoking about 67 years ago. His smoking use included cigarettes. He has never used smokeless tobacco. He reports that he does not drink alcohol and does not use drugs. family history includes Bladder Cancer (age of onset: 63) in his mother; Healthy in his daughter and son; Heart attack (age of onset: 5) in his father; Pancreatic cancer (age of onset: 76) in his sister. Allergies  Allergen Reactions   Crestor [Rosuvastatin Calcium] Rash    All over body   Lovastatin Itching and Rash   Current Outpatient Medications on File Prior to Visit  Medication Sig Dispense Refill   ACCU-CHEK AVIVA PLUS test strip USE FOR TESTING UP TO 4 TIMES DAILY AS DIRECTED 100 strip 2   aspirin EC 81 MG tablet Take 81 mg by mouth daily after supper.     blood glucose meter kit and supplies Dispense based on patient and insurance preference. Use up to four times daily as directed. (FOR ICD-10 E10.9, E11.9). 1 each 0   Blood Glucose Monitoring Suppl (ONE TOUCH ULTRA 2) w/Device KIT Use as directed E 11.9 1 each 0   brimonidine (ALPHAGAN) 0.2 % ophthalmic solution Administer 1 drop to both eyes Three (3) times a day.     doxycycline (VIBRA-TABS) 100 MG tablet Take 1 tablet (100 mg total) by mouth 2 (two) times daily. 20 tablet 0   finasteride (PROSCAR) 5 MG tablet TAKE 1 TABLET (5 MG TOTAL) BY MOUTH DAILY. 90 tablet 3   glipiZIDE (GLUCOTROL XL) 5 MG 24 hr tablet TAKE 1 TABLET EVERY DAY WITH BREAKFAST 90 tablet 3   isosorbide mononitrate (IMDUR) 30 MG 24 hr tablet TAKE 1/2 OF A TABLET (15 MG TOTAL) BY MOUTH DAILY 45 tablet 2   Lancets MISC Use as directed twice daily E11.9 200  each 3   latanoprost (XALATAN) 0.005 % ophthalmic solution Place 1 drop into both eyes at bedtime.  12   losartan (COZAAR) 100 MG tablet TAKE 1 TABLET BY MOUTH EVERY DAY 90 tablet 3   meclizine (ANTIVERT) 12.5 MG tablet TAKE 1 TABLET BY MOUTH 3 TIMES DAILY AS NEEDED FOR DIZZINESS. 90 tablet 0   metFORMIN (GLUCOPHAGE-XR) 500 MG 24 hr tablet Take 3 tablets (1,500 mg total) by mouth daily with breakfast. 270 tablet 3   metoprolol succinate (TOPROL-XL) 25 MG 24 hr tablet TAKE 1/2 TABLET BY MOUTH EVERY DAY 45 tablet 2   nitroGLYCERIN (NITROSTAT) 0.4 MG SL tablet PLACE 1 TABLET UNDER THE TONGUE EVERY 5  MINUTES AS NEEDED FOR CHEST PAIN 25 tablet 2   omeprazole (PRILOSEC) 40 MG capsule Take 1 capsule (40 mg total) by mouth daily. 90 capsule 3   ondansetron (ZOFRAN-ODT) 4 MG disintegrating tablet Take 1 tablet (4 mg total) by mouth every 8 (eight) hours as needed for nausea or vomiting. 30 tablet 1   simvastatin (ZOCOR) 40 MG tablet TAKE 1 TABLET BY MOUTH EVERYDAY AT BEDTIME 90 tablet 3   triamcinolone cream (KENALOG) 0.1 % Apply 1 Application topically 2 (two) times daily. 454 g 2   No current facility-administered medications on file prior to visit.        ROS:  All others reviewed and negative.  Objective        PE:  BP 120/76 (BP Location: Right Arm, Patient Position: Sitting, Cuff Size: Normal)   Pulse 65   Temp 98 F (36.7 C) (Oral)   Ht 5\' 10"  (1.778 m)   Wt 150 lb (68 kg)   SpO2 98%   BMI 21.52 kg/m                 Constitutional: Pt appears in NAD               HENT: Head: NCAT.                Right Ear: External ear normal.                 Left Ear: External ear normal. Bilat tm's with mild erythema.  Max sinus areas non tender.  Pharynx with mild erythema, no exudate               Eyes: . Pupils are equal, round, and reactive to light. Conjunctivae and EOM are normal               Nose: without d/c or deformity               Neck: Neck supple. Gross normal ROM                Cardiovascular: Normal rate and regular rhythm.                 Pulmonary/Chest: Effort normal and breath sounds without rales or wheezing.                Abd:  Soft, NT, ND, + BS, no organomegaly               Neurological: Pt is alert. At baseline orientation, motor grossly intact               Skin: Skin is warm. No rashes, no other new lesions, LE edema - none               Psychiatric: Pt behavior is normal without agitation   Micro: none  Cardiac tracings I have personally interpreted today:  none  Pertinent Radiological findings (summarize): none   Lab Results  Component Value Date   WBC 6.5 03/07/2023   HGB 16.1 03/07/2023   HCT 48.3 03/07/2023   PLT 158.0 03/07/2023   GLUCOSE 176 (H) 03/07/2023   CHOL 119 03/07/2023   TRIG 224.0 (H) 03/07/2023   HDL 28.80 (L) 03/07/2023   LDLDIRECT 65.0 03/07/2023   LDLCALC 54 08/22/2022   ALT 16 03/07/2023   AST 14 03/07/2023   NA 140 03/07/2023   K 4.5 03/07/2023   CL 104 03/07/2023   CREATININE 1.16 03/07/2023   BUN 21  03/07/2023   CO2 29 03/07/2023   TSH 2.24 03/07/2023   PSA 2.49 12/21/2012   INR 1.15 12/10/2016   HGBA1C 8.4 (H) 03/07/2023   MICROALBUR 1.3 03/07/2023   Assessment/Plan:  Keith Herrera is a 89 y.o. White or Caucasian [1] male with  has a past medical history of Adenomatous polyp of colon (05/2004), Allergic rhinitis, Anxiety, Aortic insufficiency (03/01/2022), BPH (benign prostatic hyperplasia), CAD (coronary artery disease), Carotid stenosis, Coronary artery disease involving native coronary artery of native heart with angina pectoris (11/02/2008), Diabetes type 2, controlled, Diverticulosis, ED (erectile dysfunction), Esophageal stricture, GERD (gastroesophageal reflux disease), Hemorrhoids, Hiatal hernia, Hyperlipidemia, Hypertension, IBS (irritable bowel syndrome), PVC's (premature ventricular contractions) (01/21/2020), Right inguinal hernia, and SVT (supraventricular tachycardia).  Vitamin D deficiency Last  vitamin D Lab Results  Component Value Date   VD25OH 30.36 02/28/2022   Low, to start oral replacement   Encounter for well adult exam with abnormal findings Age and sex appropriate education and counseling updated with regular exercise and diet Referrals for preventative services - none needed Immunizations addressed - for shingrix at pharmacy Smoking counseling  - none needed Evidence for depression or other mood disorder - none significant Most recent labs reviewed. I have personally reviewed and have noted: 1) the patient's medical and social history 2) The patient's current medications and supplements 3) The patient's height, weight, and BMI have been recorded in the chart   Allergic rhinitis Mild to mod, for depomedrol IM 80 mg, prednisone taper,  to f/u any worsening symptoms or concerns  Aortic atherosclerosis (HCC) Pt to continue low chol diet, exercise, zocor 40 mg qd  Essential hypertension BP Readings from Last 3 Encounters:  03/07/23 120/76  12/01/22 120/70  11/28/22 126/64   Stable, pt to continue medical treatment losartan 100 qd, toprol xl 25 mg - 1/2 qd   Hyperlipidemia LDL goal <70 Lab Results  Component Value Date   LDLCALC 54 08/22/2022   Stable, pt to continue current statin zocor 40 mg qd   RLS (restless legs syndrome) With worsening control, to change klonopin to gabapentin 300 mg qhs  Type 2 diabetes mellitus with vascular disease (HCC) Lab Results  Component Value Date   HGBA1C 8.4 (H) 03/07/2023   Uncontrolled, pt to continue current medical treatment glipizide xl 5 mg qd, metformin Er 500 mg - 3 qd, but increase jardiance to 25 mg qd  Followup: Return in about 6 months (around 09/06/2023).  Oliver Barre, MD 03/09/2023 5:35 AM [facility]

## 2023-03-07 NOTE — Assessment & Plan Note (Signed)
Last vitamin D Lab Results  Component Value Date   VD25OH 30.36 02/28/2022   Low, to start oral replacement  

## 2023-03-09 ENCOUNTER — Encounter: Payer: Self-pay | Admitting: Internal Medicine

## 2023-03-09 NOTE — Assessment & Plan Note (Signed)
BP Readings from Last 3 Encounters:  03/07/23 120/76  12/01/22 120/70  11/28/22 126/64   Stable, pt to continue medical treatment losartan 100 qd, toprol xl 25 mg - 1/2 qd

## 2023-03-09 NOTE — Assessment & Plan Note (Signed)
With worsening control, to change klonopin to gabapentin 300 mg qhs

## 2023-03-09 NOTE — Assessment & Plan Note (Signed)
Mild to mod, for depomedrol IM 80 mg , prednisone taper,   to f/u any worsening symptoms or concerns 

## 2023-03-09 NOTE — Assessment & Plan Note (Signed)
Lab Results  Component Value Date   HGBA1C 8.4 (H) 03/07/2023   Uncontrolled, pt to continue current medical treatment glipizide xl 5 mg qd, metformin Er 500 mg - 3 qd, but increase jardiance to 25 mg qd

## 2023-03-09 NOTE — Assessment & Plan Note (Signed)
Lab Results  Component Value Date   LDLCALC 54 08/22/2022   Stable, pt to continue current statin zocor 40 mg qd

## 2023-03-09 NOTE — Assessment & Plan Note (Signed)
Pt to continue low chol diet, exercise, zocor 40 mg qd

## 2023-03-09 NOTE — Assessment & Plan Note (Signed)

## 2023-03-17 DIAGNOSIS — H53141 Visual discomfort, right eye: Secondary | ICD-10-CM | POA: Diagnosis not present

## 2023-03-17 DIAGNOSIS — Z961 Presence of intraocular lens: Secondary | ICD-10-CM | POA: Diagnosis not present

## 2023-03-17 DIAGNOSIS — H01006 Unspecified blepharitis left eye, unspecified eyelid: Secondary | ICD-10-CM | POA: Diagnosis not present

## 2023-03-17 DIAGNOSIS — H21233 Degeneration of iris (pigmentary), bilateral: Secondary | ICD-10-CM | POA: Diagnosis not present

## 2023-03-17 DIAGNOSIS — H01003 Unspecified blepharitis right eye, unspecified eyelid: Secondary | ICD-10-CM | POA: Diagnosis not present

## 2023-04-16 ENCOUNTER — Encounter: Payer: Self-pay | Admitting: Physician Assistant

## 2023-04-16 ENCOUNTER — Ambulatory Visit: Payer: Medicare HMO | Attending: Physician Assistant | Admitting: Physician Assistant

## 2023-04-16 VITALS — BP 132/62 | HR 62 | Ht 70.0 in | Wt 143.4 lb

## 2023-04-16 DIAGNOSIS — E785 Hyperlipidemia, unspecified: Secondary | ICD-10-CM

## 2023-04-16 DIAGNOSIS — M79672 Pain in left foot: Secondary | ICD-10-CM | POA: Diagnosis not present

## 2023-04-16 DIAGNOSIS — M79671 Pain in right foot: Secondary | ICD-10-CM | POA: Diagnosis not present

## 2023-04-16 DIAGNOSIS — I10 Essential (primary) hypertension: Secondary | ICD-10-CM

## 2023-04-16 DIAGNOSIS — I2510 Atherosclerotic heart disease of native coronary artery without angina pectoris: Secondary | ICD-10-CM

## 2023-04-16 NOTE — Patient Instructions (Addendum)
Medication Instructions:   Your physician recommends that you continue on your current medications as directed. Please refer to the Current Medication list given to you today.   *If you need a refill on your cardiac medications before your next appointment, please call your pharmacy*   Lab Work: RETURN IN 8 WEEKS FOR FASTING LABS LFT AND LIPIDS    If you have labs (blood work) drawn today and your tests are completely normal, you will receive your results only by: MyChart Message (if you have MyChart) OR A paper copy in the mail If you have any lab test that is abnormal or we need to change your treatment, we will call you to review the results.   Testing/Procedures: Your physician has requested that you have an ankle brachial index (ABI). During this test an ultrasound and blood pressure cuff are used to evaluate the arteries that supply the arms and legs with blood. Allow thirty minutes for this exam. There are no restrictions or special instructions.     Follow-Up: At [facility], you and your health needs are our priority.  As part of our continuing mission to provide you with exceptional heart care, we have created designated Provider Care Teams.  These Care Teams include your primary Cardiologist (physician) and Advanced Practice Providers (APPs -  Physician Assistants and Nurse Practitioners) who all work together to provide you with the care you need, when you need it.  We recommend signing up for the patient portal called "MyChart".  Sign up information is provided on this After Visit Summary.  MyChart is used to connect with patients for Virtual Visits (Telemedicine).  Patients are able to view lab/test results, encounter notes, upcoming appointments, etc.  Non-urgent messages can be sent to your provider as well.   To learn more about what you can do with MyChart, go to [email or web address].    Your next appointment:   6 month(s)  Provider:   [name], MD      Other Instructions Heart-Healthy Eating Plan Eating a healthy diet is important for the health of your heart. A heart-healthy eating plan includes: Eating less unhealthy fats. Eating more healthy fats. Eating less salt in your food. Salt is also called sodium. Making other changes in your diet. Talk with your doctor or a diet specialist (dietitian) to create an eating plan that is right for you. What is my plan? Your doctor may recommend an eating plan that includes: Total fat: ______% or less of total calories a day. Saturated fat: ______% or less of total calories a day. Cholesterol: less than _________mg a day. Sodium: less than _________mg a day. What are tips for following this plan? Cooking Avoid frying your food. Try to bake, boil, grill, or broil it instead. You can also reduce fat by: Removing the skin from poultry. Removing all visible fats from meats. Steaming vegetables in water or broth. Meal planning  At meals, divide your plate into four equal parts: Fill one-half of your plate with vegetables and green salads. Fill one-fourth of your plate with whole grains. Fill one-fourth of your plate with lean protein foods. Eat 2-4 cups of vegetables per day. One cup of vegetables is: 1 cup (91 g) broccoli or cauliflower florets. 2 medium carrots. 1 large bell pepper. 1 large sweet potato. 1 large tomato. 1 medium white potato. 2 cups (150 g) raw leafy greens. Eat 1-2 cups of fruit per day. One cup of fruit is: 1 small apple 1 large banana 1  cup (237 g) mixed fruit, 1 large orange,  cup (82 g) dried fruit, 1 cup (240 mL) 100% fruit juice. Eat more foods that have soluble fiber. These are apples, broccoli, carrots, beans, peas, and barley. Try to get 20-30 g of fiber per day. Eat 4-5 servings of nuts, legumes, and seeds per week: 1 serving of dried beans or legumes equals  cup (90 g) cooked. 1 serving of nuts is  oz (12 almonds, 24 pistachios, or 7 walnut  halves). 1 serving of seeds equals  oz (8 g). General information Eat more home-cooked food. Eat less restaurant, buffet, and fast food. Limit or avoid alcohol. Limit foods that are high in starch and sugar. Avoid fried foods. Lose weight if you are overweight. Keep track of how much salt (sodium) you eat. This is important if you have high blood pressure. Ask your doctor to tell you more about this. Try to add vegetarian meals each week. Fats Choose healthy fats. These include olive oil and canola oil, flaxseeds, walnuts, almonds, and seeds. Eat more omega-3 fats. These include salmon, mackerel, sardines, tuna, flaxseed oil, and ground flaxseeds. Try to eat fish at least 2 times each week. Check food labels. Avoid foods with trans fats or high amounts of saturated fat. Limit saturated fats. These are often found in animal products, such as meats, butter, and cream. These are also found in plant foods, such as palm oil, palm kernel oil, and coconut oil. Avoid foods with partially hydrogenated oils in them. These have trans fats. Examples are stick margarine, some tub margarines, cookies, crackers, and other baked goods. What foods should I eat? Fruits All fresh, canned (in natural juice), or frozen fruits. Vegetables Fresh or frozen vegetables (raw, steamed, roasted, or grilled). Green salads. Grains Most grains. Choose whole wheat and whole grains most of the time. Rice and pasta, including brown rice and pastas made with whole wheat. Meats and other proteins Lean, well-trimmed beef, veal, pork, and lamb. Chicken and [place] without skin. All fish and shellfish. Wild duck, rabbit, pheasant, and venison. Egg whites or low-cholesterol egg substitutes. Dried beans, peas, lentils, and tofu. Seeds and most nuts. Dairy Low-fat or nonfat cheeses, including ricotta and mozzarella. Skim or 1% milk that is liquid, powdered, or evaporated. Buttermilk that is made with low-fat milk. Nonfat or  low-fat yogurt. Fats and oils Non-hydrogenated (trans-free) margarines. Vegetable oils, including soybean, sesame, sunflower, olive, peanut, safflower, corn, canola, and cottonseed. Salad dressings or mayonnaise made with a vegetable oil. Beverages Mineral water. Coffee and tea. Diet carbonated beverages. Sweets and desserts Sherbet, gelatin, and fruit ice. Small amounts of dark chocolate. Limit all sweets and desserts. Seasonings and condiments All seasonings and condiments. The items listed above may not be a complete list of foods and drinks you can eat. Contact a dietitian for more options. What foods should I avoid? Fruits Canned fruit in heavy syrup. Fruit in cream or butter sauce. Fried fruit. Limit coconut. Vegetables Vegetables cooked in cheese, cream, or butter sauce. Fried vegetables. Grains Breads that are made with saturated or trans fats, oils, or whole milk. Croissants. Sweet rolls. Donuts. High-fat crackers, such as cheese crackers. Meats and other proteins Fatty meats, such as hot dogs, ribs, sausage, bacon, rib-eye roast or steak. High-fat deli meats, such as salami and bologna. Caviar. Domestic duck and goose. Organ meats, such as liver. Dairy Cream, sour cream, cream cheese, and creamed cottage cheese. Whole-milk cheeses. Whole or 2% milk that is liquid, evaporated, or condensed. Whole  buttermilk. Cream sauce or high-fat cheese sauce. Yogurt that is made from whole milk. Fats and oils Meat fat, or shortening. Cocoa butter, hydrogenated oils, palm oil, coconut oil, palm kernel oil. Solid fats and shortenings, including bacon fat, salt pork, lard, and butter. Nondairy cream substitutes. Salad dressings with cheese or sour cream. Beverages Regular sodas and juice drinks with added sugar. Sweets and desserts Frosting. Pudding. Cookies. Cakes. Pies. Milk chocolate or white chocolate. Buttered syrups. Full-fat ice cream or ice cream drinks. The items listed above may not be  a complete list of foods and drinks to avoid. Contact a dietitian for more information. Summary Heart-healthy meal planning includes eating less unhealthy fats, eating more healthy fats, and making other changes in your diet. Eat a balanced diet. This includes fruits and vegetables, low-fat or nonfat dairy, lean protein, nuts and legumes, whole grains, and heart-healthy oils and fats. This information is not intended to replace advice given to you by your health care provider. Make sure you discuss any questions you have with your health care provider. Document Revised: 12/10/2021 Document Reviewed: 12/10/2021 Elsevier Patient Education  2024 [employer] Low-Sodium Eating Plan Salt (sodium) helps you keep a healthy balance of fluids in your body. Too much sodium can raise your blood pressure. It can also cause fluid and waste to be held in your body. Your health care provider or dietitian may recommend a low-sodium eating plan if you have high blood pressure (hypertension), kidney disease, liver disease, or heart failure. Eating less sodium can help lower your blood pressure and reduce swelling. It can also protect your heart, liver, and kidneys. What are tips for following this plan? Reading food labels  Check food labels for the amount of sodium per serving. If you eat more than one serving, you must multiply the listed amount by the number of servings. Choose foods with less than 140 milligrams (mg) of sodium per serving. Avoid foods with 300 mg of sodium or more per serving. Always check how much sodium is in a product, even if the label says "unsalted" or "no salt added." Shopping  Buy products labeled as "low-sodium" or "no salt added." Buy fresh foods. Avoid canned foods and pre-made or frozen meals. Avoid canned, cured, or processed meats. Buy breads that have less than 80 mg of sodium per slice. Cooking  Eat more home-cooked food. Try to eat less restaurant, buffet, and fast  food. Try not to add salt when you cook. Use salt-free seasonings or herbs instead of table salt or sea salt. Check with your provider or pharmacist before using salt substitutes. Cook with plant-based oils, such as canola, sunflower, or olive oil. Meal planning When eating at a restaurant, ask if your food can be made with less salt or no salt. Avoid dishes labeled as brined, pickled, cured, or smoked. Avoid dishes made with soy sauce, miso, or teriyaki sauce. Avoid foods that have monosodium glutamate (MSG) in them. MSG may be added to some restaurant food, sauces, soups, bouillon, and canned foods. Make meals that can be grilled, baked, poached, roasted, or steamed. These are often made with less sodium. General information Try to limit your sodium intake to 1,500-2,300 mg each day, or the amount told by your provider. What foods should I eat? Fruits Fresh, frozen, or canned fruit. Fruit juice. Vegetables Fresh or frozen vegetables. "No salt added" canned vegetables. "No salt added" tomato sauce and paste. Low-sodium or reduced-sodium tomato and vegetable juice. Grains Low-sodium cereals, such as oats,  puffed wheat and rice, and shredded wheat. Low-sodium crackers. Unsalted rice. Unsalted pasta. Low-sodium bread. Whole grain breads and whole grain pasta. Meats and other proteins Fresh or frozen meat, poultry, seafood, and fish. These should have no added salt. Low-sodium canned tuna and salmon. Unsalted nuts. Dried peas, beans, and lentils without added salt. Unsalted canned beans. Eggs. Unsalted nut butters. Dairy Milk. Soy milk. Cheese that is naturally low in sodium, such as ricotta cheese, fresh mozzarella, or Swiss cheese. Low-sodium or reduced-sodium cheese. Cream cheese. Yogurt. Seasonings and condiments Fresh and dried herbs and spices. Salt-free seasonings. Low-sodium mustard and ketchup. Sodium-free salad dressing. Sodium-free light mayonnaise. Fresh or refrigerated horseradish.  Lemon juice. Vinegar. Other foods Homemade, reduced-sodium, or low-sodium soups. Unsalted popcorn and pretzels. Low-salt or salt-free chips. The items listed above may not be all the foods and drinks you can have. Talk to a dietitian to learn more. What foods should I avoid? Vegetables Sauerkraut, pickled vegetables, and relishes. Olives. [place] fries. Onion rings. Regular canned vegetables, except low-sodium or reduced-sodium items. Regular canned tomato sauce and paste. Regular tomato and vegetable juice. Frozen vegetables in sauces. Grains Instant hot cereals. Bread stuffing, pancake, and biscuit mixes. Croutons. Seasoned rice or pasta mixes. Noodle soup cups. Boxed or frozen macaroni and cheese. Regular salted crackers. Self-rising flour. Meats and other proteins Meat or fish that is salted, canned, smoked, spiced, or pickled. Precooked or cured meat, such as sausages or meat loaves. Tomasa Blase. Ham. Pepperoni. Hot dogs. Corned beef. Chipped beef. Salt pork. Jerky. Pickled herring, anchovies, and sardines. Regular canned tuna. Salted nuts. Dairy Processed cheese and cheese spreads. Hard cheeses. Cheese curds. Blue cheese. Feta cheese. String cheese. Regular cottage cheese. Buttermilk. Canned milk. Fats and oils Salted butter. Regular margarine. Ghee. Bacon fat. Seasonings and condiments Onion salt, garlic salt, seasoned salt, table salt, and sea salt. Canned and packaged gravies. Worcestershire sauce. Tartar sauce. Barbecue sauce. Teriyaki sauce. Soy sauce, including reduced-sodium soy sauce. Steak sauce. Fish sauce. Oyster sauce. Cocktail sauce. Horseradish that you find on the shelf. Regular ketchup and mustard. Meat flavorings and tenderizers. Bouillon cubes. Hot sauce. Pre-made or packaged marinades. Pre-made or packaged taco seasonings. Relishes. Regular salad dressings. Salsa. Other foods Salted popcorn and pretzels. Corn chips and puffs. Potato and tortilla chips. Canned or dried soups.  Pizza. Frozen entrees and pot pies. The items listed above may not be all the foods and drinks you should avoid. Talk to a dietitian to learn more. This information is not intended to replace advice given to you by your health care provider. Make sure you discuss any questions you have with your health care provider. Document Revised: 11/21/2022 Document Reviewed: 11/21/2022 Elsevier Patient Education  2024 [employer].

## 2023-04-16 NOTE — Progress Notes (Signed)
Office Visit    Patient Name: Keith Herrera Date of Encounter: 04/16/2023  PCP:  [name], MD   [facility]  Cardiologist:  [name], MD  Advanced Practice Provider:  [name], PA-C Electrophysiologist:  [name], MD   HPI    Keith Herrera is a 89 y.o. male  with a PMH of :  Coronary artery disease  S/p DES to LAD and D1 in 2010 S/p POBA to D1 in 11/2016 (no stent due to prior PCI) Carotid artery disease Aortic atherosclerosis  PSVT EPS in 2016 >> no inducible SVT PVCs Symptomatic; eval by [name] in past (tx options limited due to CAD) Monitor 07/2019: PVC burden 21.6% Hypertension  Hyperlipidemia  Diabetes mellitus 2  GERD  Esophageal stricture Prior hx of chest pain of GI origin   presents today for follow-up appointment.  Patient was last seen by Dr. [name] 10/02/2022 and at that time was doing well.  He continued to blow leaves with a backpack mower the previous weekend.  Seen by [name] his primary care and amlodipine was discontinued due to hypotension.  Patient denied any issues with chest pain/pressure or shortness of breath.  Occasionally had some symptoms that he attributed to bloating/GERD.  No exertional symptoms.  No edema, orthopnea, or PND.  Had some trouble with blood sugar control and had tried a few different medications under the direction of [name].  Went to pick up Jardiance from the pharmacy but it was going to cost him nearly $500 because he is in the "donut hole".  He did not fill the prescription.  Today, he tells me that he has been running around lately.  His wife is in the hospital for pancreatitis and she was in rehab for 4 weeks.  He lost about 10 pounds since he was not eating well during this time.  He eats a lot of junk food.  He is still having to help her out a lot.  No issues with his medications.  His insurance is now covering the [place].  He is currently taking his medication.   No swelling in his legs.  No shortness of breath or chest pain.  His last lipid panel revealed LDL at goal, 54.  Triglycerides however were elevated at 224.  We will recheck in a few months.  Reports no shortness of breath nor dyspnea on exertion. Reports no chest pain, pressure, or tightness. No edema, orthopnea, PND. Reports no palpitations.   Past Medical History    Past Medical History:  Diagnosis Date   Adenomatous polyp of colon 05/2004   Allergic rhinitis    Anxiety    Aortic insufficiency 03/01/2022   Echocardiogram 02/2022: EF 60-65, no RWMA, Gr 2 DD, normal RVSF, mild to mod LAE, mild MR, mild AI, AV sclerosis w/o AS   BPH (benign prostatic hyperplasia)    CAD (coronary artery disease)    s/p Cypher DES to LAD and pD1 2010;  LHC was done 6/12: EF 65%, circumflex patent, mid RCA 80-90% (small and nondominant), LAD and diagonal stents patent, LAD at the origin of the first diagonal 30%, ostial D2 90%, mid 80-90% (small vessel).  Continued medical therapy was recommended.    Carotid stenosis    dopplers 02/2012: 0-39% RICA; 40-59% LICA   Coronary artery disease involving native coronary artery of native heart with angina pectoris (HCC) 11/02/2008   Myoview 02/2022: EF 76, no ischemia or infarction; low risk  Diabetes type 2, controlled (HCC)    Diverticulosis    colon   ED (erectile dysfunction)    Esophageal stricture    GERD (gastroesophageal reflux disease)    Hemorrhoids    Hiatal hernia    Hyperlipidemia    Hypertension    IBS (irritable bowel syndrome)    PVC's (premature ventricular contractions) 01/21/2020   Monitor 07/2019: PVC burden 21.6% // Echocardiogram 01/2020: EF 65-70, no RWMA, Gr 2 DD, normal RVSF, RVSP 43.3 mmHg (mod pul HTN), mild LAE, mild MR, trivial AI   Right inguinal hernia    s/p repair in 8/12   SVT (supraventricular tachycardia)    Past Surgical History:  Procedure Laterality Date   CARDIAC CATHETERIZATION N/A 12/10/2016   Procedure: Left Heart Cath  and Coronary Angiography;  Surgeon: [name], MD;  Location: [facility] INVASIVE CV LAB;  Service: Cardiovascular;  Laterality: N/A;   CARDIAC CATHETERIZATION N/A 12/10/2016   Procedure: Coronary Balloon Angioplasty;  Surgeon: [name], MD;  Location: [facility] INVASIVE CV LAB;  Service: Cardiovascular;  Laterality: N/A;   CARDIAC CATHETERIZATION N/A 12/10/2016   Procedure: Intravascular Pressure Wire/FFR Study;  Surgeon: [name], MD;  Location: [facility] INVASIVE CV LAB;  Service: Cardiovascular;  Laterality: N/A;   CORONARY STENT PLACEMENT  10/2008   x 2   ELECTROPHYSIOLOGIC STUDY N/A 03/20/2015   no inducible SVT - Dr [name] HERNIA REPAIR Right    RIH with ultrapro patch    Allergies  Allergies  Allergen Reactions   Crestor [Rosuvastatin Calcium] Rash    All over body   Lovastatin Itching and Rash    EKGs/Labs/Other Studies Reviewed:   The following studies were reviewed today: Cardiac Studies & Procedures   CARDIAC CATHETERIZATION  CARDIAC CATHETERIZATION 12/10/2016  Narrative  Non-dominant vessel, Mid RCA lesion, 100 %stenosed. This has progressed since 2012. There are right to right and left to right collaterals.  Patent stent in the first Diag.  Patent proximal LAD stent.  Ost 2nd Diag to mid 2nd Diag lesion, 75 %stenosed. Stable disease since 2012.  1st Diag lesion, 80 %stenosed. This lesion was past the prior stent. After PTCA with a 2.0 balloon, a stent could not be delivered due to the prior LAD and diagional stents.  Post intervention with PTCA, there is a 10% residual stenosis.  LV end diastolic pressure is normal.  There is no aortic valve stenosis.  50% mid LAD lesion which was assessed by FFR. Resting FFR was 0.89 and after adenosine, FFR dropped to 0.82.  Continue dual antiplatelet therapy for at least a month. No stent was actually placed. The stent could not be delivered due to the prior LAD and diagonal stents. There is an  excellent angioplasty result.  FFR of the mid LAD was negative. Continue aggressive medical therapy. Can consider continuing clopidogrel long-term.  Findings Coronary Findings Diagnostic  Dominance: Left  Left Anterior Descending Previously placed Prox LAD to Mid LAD drug eluting stent is widely patent.  First Diagonal Branch Vessel is small in size. Previously placed Ost 1st Diag to 1st Diag drug eluting stent is widely patent.  Second Diagonal Branch Vessel is small in size.  Left Circumflex The vessel exhibits minimal luminal irregularities.  Right Coronary Artery Vessel is small. Collaterals Dist RCA filled by collaterals from Acute Mrg.  The lesion is chronically occluded with left-to-right and right-to-right collateral flow.  Right Posterior Descending Artery Collaterals RPDA filled by collaterals from Dist LAD.  Intervention  1st Diag lesion Angioplasty Lesion crossed with guidewire using a WIRE ASAHI PROWATER 180CM. Angioplasty alone was performed using a BALLOON MOZEC 2.0X12. Maximum pressure: 10 atm. The pre-interventional distal flow is normal (TIMI 3).  The post-interventional distal flow is normal (TIMI 3). The intervention was successful . No complications occurred at this lesion. Unable to deliver 2.25 Promus stent through the prior stented area. There is a 10% residual stenosis post intervention.   STRESS TESTS  MYOCARDIAL PERFUSION IMAGING 03/01/2022  Narrative   The study is normal. The study is low risk.   No ST deviation was noted.   LV perfusion is normal. There is no evidence of ischemia. There is no evidence of infarction.   Left ventricular function is normal. Nuclear stress EF: 76 %. End diastolic cavity size is normal. End systolic cavity size is normal.   Prior study available for comparison from 12/09/2016.   ECHOCARDIOGRAM  ECHOCARDIOGRAM COMPLETE 03/01/2022  Narrative ECHOCARDIOGRAM REPORT    Patient Name:   JACQUES POPKE Date  of Exam: 03/01/2022 Medical Rec #:  [id number]        Height:       70.0 in Accession #:    [id number]       Weight:       158.0 lb Date of Birth:  Jan 27, 1932        BSA:          1.888 m Patient Age:    89 years         BP:           118/50 mmHg Patient Gender: M                HR:           57 bpm. Exam Location:  [address]  Procedure: 2D Echo, Cardiac Doppler and Color Doppler  Indications:    R06.02 SOB  History:        Patient has prior history of Echocardiogram examinations, most recent 01/20/2020. CAD, Arrythmias:PVC, Signs/Symptoms:Shortness of Breath and Chest Pain; Risk Factors:Former Smoker, Diabetes, Hypertension and Dyslipidemia.  Sonographer:    Samule Ohm RDCS Referring Phys: [id number] Evern Bio WEAVER  IMPRESSIONS   1. Left ventricular ejection fraction, by estimation, is 60 to 65%. The left ventricle has normal function. The left ventricle has no regional wall motion abnormalities. There is mild left ventricular hypertrophy. Left ventricular diastolic parameters are consistent with Grade II diastolic dysfunction (pseudonormalization). 2. Right ventricular systolic function is normal. The right ventricular size is normal. 3. Left atrial size was mild to moderately dilated. 4. Posterior leaflet calcified. Mild mitral valve regurgitation. 5. The tricuspid valve is degenerative. 6. Aortic valve regurgitation is mild. Aortic valve sclerosis/calcification is present, without any evidence of aortic stenosis. 7. The inferior vena cava is normal in size with greater than 50% respiratory variability, suggesting right atrial pressure of 3 mmHg.  Comparison(s): No significant change from prior study.  FINDINGS Left Ventricle: Left ventricular ejection fraction, by estimation, is 60 to 65%. The left ventricle has normal function. The left ventricle has no regional wall motion abnormalities. The left ventricular internal cavity size was normal in size. There is mild left  ventricular hypertrophy. Left ventricular diastolic parameters are consistent with Grade II diastolic dysfunction (pseudonormalization).  Right Ventricle: The right ventricular size is normal. No increase in right ventricular wall thickness. Right ventricular systolic function is normal.  Left Atrium: Left atrial size was mild to moderately dilated.  Right Atrium: Right  atrial size was normal in size. Prominent Eustachian valve.  Pericardium: There is no evidence of pericardial effusion.  Mitral Valve: Posterior leaflet calcified. Mild mitral valve regurgitation.  Tricuspid Valve: The tricuspid valve is degenerative in appearance. Tricuspid valve regurgitation is mild.  Aortic Valve: Aortic valve regurgitation is mild. Aortic regurgitation PHT measures 476 msec. Aortic valve sclerosis/calcification is present, without any evidence of aortic stenosis.  Pulmonic Valve: The pulmonic valve was grossly normal. Pulmonic valve regurgitation is not visualized.  Aorta: The aortic root and ascending aorta are structurally normal, with no evidence of dilitation.  Venous: The inferior vena cava is normal in size with greater than 50% respiratory variability, suggesting right atrial pressure of 3 mmHg.  IAS/Shunts: No atrial level shunt detected by color flow Doppler.   LEFT VENTRICLE PLAX 2D LVIDd:         4.00 cm   Diastology LVIDs:         2.50 cm   LV e' medial:    7.62 cm/s LV PW:         1.20 cm   LV E/e' medial:  14.2 LV IVS:        1.10 cm   LV e' lateral:   7.61 cm/s LVOT diam:     1.80 cm   LV E/e' lateral: 14.2 LV SV:         62 LV SV Index:   33 LVOT Area:     2.54 cm   RIGHT VENTRICLE             IVC RV S prime:     15.30 cm/s  IVC diam: 1.60 cm TAPSE (M-mode): 2.8 cm RVSP:           46.0 mmHg  LEFT ATRIUM             Index        RIGHT ATRIUM           Index LA diam:        4.00 cm 2.12 cm/m   RA Pressure: 3.00 mmHg LA Vol (A2C):   43.7 ml 23.14 ml/m  RA Area:      18.10 cm LA Vol (A4C):   58.5 ml 30.98 ml/m  RA Volume:   50.80 ml  26.90 ml/m LA Biplane Vol: 52.2 ml 27.64 ml/m AORTIC VALVE LVOT Vmax:   110.67 cm/s LVOT Vmean:  69.667 cm/s LVOT VTI:    0.243 m AI PHT:      476 msec  AORTA Ao Root diam: 3.00 cm Ao Asc diam:  3.40 cm  MITRAL VALVE                TRICUSPID VALVE MV Area (PHT): 3.46 cm     TR Peak grad:   43.0 mmHg MV Decel Time: 219 msec     TR Vmax:        328.00 cm/s MV E velocity: 108.00 cm/s  Estimated RAP:  3.00 mmHg MV A velocity: 114.00 cm/s  RVSP:           46.0 mmHg MV E/A ratio:  0.95 SHUNTS Systemic VTI:  0.24 m Systemic Diam: 1.80 cm  Carolan Clines Electronically signed by Carolan Clines Signature Date/Time: 03/01/2022/11:39:08 AM    Final    MONITORS  LONG TERM MONITOR (3-14 DAYS) 08/22/2019  Narrative 1) the basic rhythm is normal sinus with an average HR of 60 bpm 2) there are occasional PAC's with short supraventricular runs, longest 12 beats 3) frequent  PVC's with PVC burden of 21.6%, but no ventricular runs 4) no pathologic pauses 5) no atrial fibrillation or flutter            EKG:  EKG is  ordered today.  The ekg ordered today demonstrates normal sinus rhythm with occasional PVC, 62 bpm  Recent Labs: 03/07/2023: ALT 16; BUN 21; Creatinine, Ser 1.16; Hemoglobin 16.1; Platelets 158.0; Potassium 4.5; Sodium 140; TSH 2.24  Recent Lipid Panel    Component Value Date/Time   CHOL 119 03/07/2023 1344   TRIG 224.0 (H) 03/07/2023 1344   TRIG 128 11/19/2006 1032   HDL 28.80 (L) 03/07/2023 1344   CHOLHDL 4 03/07/2023 1344   VLDL 44.8 (H) 03/07/2023 1344   LDLCALC 54 08/22/2022 1419   LDLCALC 79 05/29/2020 1443   LDLDIRECT 65.0 03/07/2023 1344    Home Medications   Current Meds  Medication Sig   ACCU-CHEK AVIVA PLUS test strip USE FOR TESTING UP TO 4 TIMES DAILY AS DIRECTED   aspirin EC 81 MG tablet Take 81 mg by mouth daily after supper.   blood glucose meter kit and supplies Dispense based  on patient and insurance preference. Use up to four times daily as directed. (FOR ICD-10 E10.9, E11.9).   Blood Glucose Monitoring Suppl (ONE TOUCH ULTRA 2) w/Device KIT Use as directed E 11.9   brimonidine (ALPHAGAN) 0.2 % ophthalmic solution Administer 1 drop to both eyes Three (3) times a day.   dorzolamide-timolol (COSOPT) 2-0.5 % ophthalmic solution Place 1 drop into both eyes 2 (two) times daily.   empagliflozin (JARDIANCE) 25 MG TABS tablet Take 1 tablet (25 mg total) by mouth daily before breakfast.   finasteride (PROSCAR) 5 MG tablet TAKE 1 TABLET (5 MG TOTAL) BY MOUTH DAILY.   gabapentin (NEURONTIN) 300 MG capsule Take 1 capsule (300 mg total) by mouth at bedtime.   glipiZIDE (GLUCOTROL XL) 5 MG 24 hr tablet TAKE 1 TABLET EVERY DAY WITH BREAKFAST   isosorbide mononitrate (IMDUR) 30 MG 24 hr tablet TAKE 1/2 OF A TABLET (15 MG TOTAL) BY MOUTH DAILY   Lancets MISC Use as directed twice daily E11.9   losartan (COZAAR) 100 MG tablet TAKE 1 TABLET BY MOUTH EVERY DAY   meclizine (ANTIVERT) 12.5 MG tablet TAKE 1 TABLET BY MOUTH 3 TIMES DAILY AS NEEDED FOR DIZZINESS.   metFORMIN (GLUCOPHAGE) 500 MG tablet Take 1,000 mg by mouth daily with breakfast. Pt takes 500mg  2 tablets once a day.   metoprolol succinate (TOPROL-XL) 25 MG 24 hr tablet TAKE 1/2 TABLET BY MOUTH EVERY DAY   nitroGLYCERIN (NITROSTAT) 0.4 MG SL tablet PLACE 1 TABLET UNDER THE TONGUE EVERY 5 MINUTES AS NEEDED FOR CHEST PAIN   omeprazole (PRILOSEC) 40 MG capsule Take 1 capsule (40 mg total) by mouth daily.   simvastatin (ZOCOR) 40 MG tablet TAKE 1 TABLET BY MOUTH EVERYDAY AT BEDTIME     Review of Systems      All other systems reviewed and are otherwise negative except as noted above.  Physical Exam    VS:  BP 132/62   Pulse 62   Ht 5\' 10"  (1.778 m)   Wt 143 lb 6.4 oz (65 kg)   SpO2 97%   BMI 20.58 kg/m  , BMI Body mass index is 20.58 kg/m.  Wt Readings from Last 3 Encounters:  04/16/23 143 lb 6.4 oz (65 kg)   03/07/23 150 lb (68 kg)  11/28/22 157 lb (71.2 kg)     GEN: Well nourished, well  developed, in no acute distress. HEENT: normal. Neck: Supple, no JVD, carotid bruits, or masses. Cardiac: RRR, no murmurs, rubs, or gallops. No clubbing, cyanosis, edema.  Radials/PT 2+ and equal bilaterally.  Respiratory:  Respirations regular and unlabored, clear to auscultation bilaterally. GI: Soft, nontender, nondistended. MS: No deformity or atrophy. Skin: Warm and dry, no rash. Neuro:  Strength and sensation are intact. Psych: Normal affect.  Assessment & Plan    Coronary artery disease -no chest pains or SOB -continue asa 81mg , jardiance 25mg  daily, Imdur 15 mg daily, losartan 100 mg daily, metoprolol succinate 12.5 mg daily, nitro as needed, simvastatin 40 mg daily -no ischemic w/o indicated at this time  Hyperlipidemia LDL goal less than 70 -Continue current medication regimen -Will plan to check a lipid panel and LFTs in a couple of months -Triglycerides were above goal back in April -Discussed proper diet and staying away from fast food  Hypertension -Blood pressure well-controlled today -Continue current medication regimen -Continue to track blood pressure at home  Toes feel tight -ABIs ordered today -Tightness seems to get better with walking -Good distal pulses today, no edema on exam       Disposition: Follow up 6 months with [name], MD or APP.  Signed, Sharlene Dory, PA-C 04/16/2023, 4:24 PM [facility]

## 2023-04-17 ENCOUNTER — Ambulatory Visit: Payer: Medicare HMO | Admitting: Internal Medicine

## 2023-04-29 ENCOUNTER — Other Ambulatory Visit: Payer: Self-pay | Admitting: Physician Assistant

## 2023-04-29 DIAGNOSIS — I739 Peripheral vascular disease, unspecified: Secondary | ICD-10-CM

## 2023-04-29 DIAGNOSIS — I2510 Atherosclerotic heart disease of native coronary artery without angina pectoris: Secondary | ICD-10-CM

## 2023-05-05 ENCOUNTER — Ambulatory Visit (HOSPITAL_COMMUNITY)
Admission: RE | Admit: 2023-05-05 | Discharge: 2023-05-05 | Disposition: A | Discharge status: Home / Self Care | Payer: Medicare HMO | Source: Ambulatory Visit | Attending: Physician Assistant | Admitting: Physician Assistant

## 2023-05-05 DIAGNOSIS — R202 Paresthesia of skin: Secondary | ICD-10-CM

## 2023-05-05 DIAGNOSIS — I2510 Atherosclerotic heart disease of native coronary artery without angina pectoris: Secondary | ICD-10-CM | POA: Diagnosis not present

## 2023-05-05 DIAGNOSIS — I739 Peripheral vascular disease, unspecified: Secondary | ICD-10-CM | POA: Diagnosis not present

## 2023-05-05 LAB — VAS US ABI WITH/WO TBI
Left ABI: 1.21
Right ABI: 1.19

## 2023-05-12 ENCOUNTER — Other Ambulatory Visit: Payer: Self-pay | Admitting: Internal Medicine

## 2023-05-12 ENCOUNTER — Other Ambulatory Visit: Payer: Self-pay

## 2023-05-13 ENCOUNTER — Other Ambulatory Visit: Payer: Self-pay | Admitting: Internal Medicine

## 2023-06-11 ENCOUNTER — Ambulatory Visit: Payer: Medicare HMO | Attending: Physician Assistant

## 2023-06-11 DIAGNOSIS — I2510 Atherosclerotic heart disease of native coronary artery without angina pectoris: Secondary | ICD-10-CM

## 2023-06-12 ENCOUNTER — Telehealth: Payer: Self-pay | Admitting: Physician Assistant

## 2023-06-12 LAB — HEPATIC FUNCTION PANEL
AST: 17 IU/L (ref 0–40)
Albumin: 4.2 g/dL (ref 3.7–4.7)
Alkaline Phosphatase: 63 IU/L (ref 44–121)
Bilirubin Total: 0.6 mg/dL (ref 0.0–1.2)
Bilirubin, Direct: 0.2 mg/dL (ref 0.00–0.40)
Total Protein: 6.5 g/dL (ref 6.0–8.5)

## 2023-06-12 LAB — LIPID PANEL
Chol/HDL Ratio: 4.1 ratio (ref 0.0–5.0)
HDL: 33 mg/dL — ABNORMAL LOW (ref >39)
LDL Chol Calc (NIH): 72 mg/dL (ref 0–99)
Triglycerides: 175 mg/dL — ABNORMAL HIGH (ref 0–149)

## 2023-06-12 NOTE — Telephone Encounter (Signed)
Keith Dory, PA-C 05/07/2023  8:23 AM EDT     Keith Herrera,   Normal ABIs.  Good news.  If you have questions please let me know.   Keith Dory, PA-C    The patient has been notified of the result and verbalized understanding.  All questions (if any) were answered. Keith Schatz, RN 06/12/2023 11:19 AM    Patient advised that lab work from yesterday has not been reviewed yet. We will call him once it has.

## 2023-06-12 NOTE — Telephone Encounter (Signed)
  Pt said, he can't access his mychart and to call him for hi  result

## 2023-06-14 ENCOUNTER — Other Ambulatory Visit: Payer: Self-pay | Admitting: Internal Medicine

## 2023-06-16 ENCOUNTER — Other Ambulatory Visit: Payer: Self-pay

## 2023-06-16 ENCOUNTER — Ambulatory Visit (INDEPENDENT_AMBULATORY_CARE_PROVIDER_SITE_OTHER): Payer: Medicare HMO

## 2023-06-16 VITALS — Ht 70.0 in | Wt 143.0 lb

## 2023-06-16 DIAGNOSIS — Z0000 Encounter for general adult medical examination without abnormal findings: Secondary | ICD-10-CM

## 2023-06-16 NOTE — Patient Instructions (Signed)
[name] , Thank you for taking time to come for your Medicare Wellness Visit. I appreciate your ongoing commitment to your health goals. Please review the following plan we discussed and let me know if I can assist you in the future.   Referrals/Orders/Follow-Ups/Clinician Recommendations: Keep up the good work.  This is a list of the screening recommended for you and due dates:  Health Maintenance  Topic Date Due   Zoster (Shingles) Vaccine (1 of 2) Never done   COVID-19 Vaccine (5 - 2023-24 season) 01/13/2023   Eye exam for diabetics  03/19/2023   Flu Shot  06/19/2023   Complete foot exam   03/06/2024   Medicare Annual Wellness Visit  06/15/2024   DTaP/Tdap/Td vaccine (4 - Td or Tdap) 11/25/2028   Pneumonia Vaccine  Completed   HPV Vaccine  Aged Out    Advanced directives: (Copy Requested) Please bring a copy of your health care power of attorney and living will to the office to be added to your chart at your convenience.  Next Medicare Annual Wellness Visit scheduled for next year: Yes  Preventive Care 87 Years and Older, Male  Preventive care refers to lifestyle choices and visits with your health care provider that can promote health and wellness. What does preventive care include? A yearly physical exam. This is also called an annual well check. Dental exams once or twice a year. Routine eye exams. Ask your health care provider how often you should have your eyes checked. Personal lifestyle choices, including: Daily care of your teeth and gums. Regular physical activity. Eating a healthy diet. Avoiding tobacco and drug use. Limiting alcohol use. Practicing safe sex. Taking low doses of aspirin every day. Taking vitamin and mineral supplements as recommended by your health care provider. What happens during an annual well check? The services and screenings done by your health care provider during your annual well check will depend on your age, overall health,  lifestyle risk factors, and family history of disease. Counseling  Your health care provider may ask you questions about your: Alcohol use. Tobacco use. Drug use. Emotional well-being. Home and relationship well-being. Sexual activity. Eating habits. History of falls. Memory and ability to understand (cognition). Work and work Astronomer. Screening  You may have the following tests or measurements: Height, weight, and BMI. Blood pressure. Lipid and cholesterol levels. These may be checked every 5 years, or more frequently if you are over 72 years old. Skin check. Lung cancer screening. You may have this screening every year starting at age 87 if you have a 30-pack-year history of smoking and currently smoke or have quit within the past 15 years. Fecal occult blood test (FOBT) of the stool. You may have this test every year starting at age 87. Flexible sigmoidoscopy or colonoscopy. You may have a sigmoidoscopy every 5 years or a colonoscopy every 10 years starting at age 70. Prostate cancer screening. Recommendations will vary depending on your family history and other risks. Hepatitis C blood test. Hepatitis B blood test. Sexually transmitted disease (STD) testing. Diabetes screening. This is done by checking your blood sugar (glucose) after you have not eaten for a while (fasting). You may have this done every 1-3 years. Abdominal aortic aneurysm (AAA) screening. You may need this if you are a current or former smoker. Osteoporosis. You may be screened starting at age 87 if you are at high risk. Talk with your health care provider about your test results, treatment options, and if necessary, the  need for more tests. Vaccines  Your health care provider may recommend certain vaccines, such as: Influenza vaccine. This is recommended every year. Tetanus, diphtheria, and acellular pertussis (Tdap, Td) vaccine. You may need a Td booster every 10 years. Zoster vaccine. You may need this  after age 87. Pneumococcal 13-valent conjugate (PCV13) vaccine. One dose is recommended after age 87. Pneumococcal polysaccharide (PPSV23) vaccine. One dose is recommended after age 87. Talk to your health care provider about which screenings and vaccines you need and how often you need them. This information is not intended to replace advice given to you by your health care provider. Make sure you discuss any questions you have with your health care provider. Document Released: 12/01/2015 Document Revised: 07/24/2016 Document Reviewed: 09/05/2015 Elsevier Interactive Patient Education  2017 [employer].  Fall Prevention in the Home Falls can cause injuries. They can happen to people of all ages. There are many things you can do to make your home safe and to help prevent falls. What can I do on the outside of my home? Regularly fix the edges of walkways and driveways and fix any cracks. Remove anything that might make you trip as you walk through a door, such as a raised step or threshold. Trim any bushes or trees on the path to your home. Use bright outdoor lighting. Clear any walking paths of anything that might make someone trip, such as rocks or tools. Regularly check to see if handrails are loose or broken. Make sure that both sides of any steps have handrails. Any raised decks and porches should have guardrails on the edges. Have any leaves, snow, or ice cleared regularly. Use sand or salt on walking paths during winter. Clean up any spills in your garage right away. This includes oil or grease spills. What can I do in the bathroom? Use night lights. Install grab bars by the toilet and in the tub and shower. Do not use towel bars as grab bars. Use non-skid mats or decals in the tub or shower. If you need to sit down in the shower, use a plastic, non-slip stool. Keep the floor dry. Clean up any water that spills on the floor as soon as it happens. Remove soap buildup in the tub or  shower regularly. Attach bath mats securely with double-sided non-slip rug tape. Do not have throw rugs and other things on the floor that can make you trip. What can I do in the bedroom? Use night lights. Make sure that you have a light by your bed that is easy to reach. Do not use any sheets or blankets that are too big for your bed. They should not hang down onto the floor. Have a firm chair that has side arms. You can use this for support while you get dressed. Do not have throw rugs and other things on the floor that can make you trip. What can I do in the kitchen? Clean up any spills right away. Avoid walking on wet floors. Keep items that you use a lot in easy-to-reach places. If you need to reach something above you, use a strong step stool that has a grab bar. Keep electrical cords out of the way. Do not use floor polish or wax that makes floors slippery. If you must use wax, use non-skid floor wax. Do not have throw rugs and other things on the floor that can make you trip. What can I do with my stairs? Do not leave any items on the  stairs. Make sure that there are handrails on both sides of the stairs and use them. Fix handrails that are broken or loose. Make sure that handrails are as long as the stairways. Check any carpeting to make sure that it is firmly attached to the stairs. Fix any carpet that is loose or worn. Avoid having throw rugs at the top or bottom of the stairs. If you do have throw rugs, attach them to the floor with carpet tape. Make sure that you have a light switch at the top of the stairs and the bottom of the stairs. If you do not have them, ask someone to add them for you. What else can I do to help prevent falls? Wear shoes that: Do not have high heels. Have rubber bottoms. Are comfortable and fit you well. Are closed at the toe. Do not wear sandals. If you use a stepladder: Make sure that it is fully opened. Do not climb a closed stepladder. Make  sure that both sides of the stepladder are locked into place. Ask someone to hold it for you, if possible. Clearly mark and make sure that you can see: Any grab bars or handrails. First and last steps. Where the edge of each step is. Use tools that help you move around (mobility aids) if they are needed. These include: Canes. Walkers. Scooters. Crutches. Turn on the lights when you go into a dark area. Replace any light bulbs as soon as they burn out. Set up your furniture so you have a clear path. Avoid moving your furniture around. If any of your floors are uneven, fix them. If there are any pets around you, be aware of where they are. Review your medicines with your doctor. Some medicines can make you feel dizzy. This can increase your chance of falling. Ask your doctor what other things that you can do to help prevent falls. This information is not intended to replace advice given to you by your health care provider. Make sure you discuss any questions you have with your health care provider. Document Released: 08/31/2009 Document Revised: 04/11/2016 Document Reviewed: 12/09/2014 Elsevier Interactive Patient Education  2017 [employer].

## 2023-06-16 NOTE — Progress Notes (Signed)
Subjective:   Keith Herrera is a 89 y.o. male who presents for Medicare Annual/Subsequent preventive examination.  Visit Complete: Virtual  I connected with  Keith Herrera on 06/16/23 by a audio enabled telemedicine application and verified that I am speaking with the correct person using two identifiers.  Patient Location: Home  Provider Location: Office/Clinic  I discussed the limitations of evaluation and management by telemedicine. The patient expressed understanding and agreed to proceed.  Vital Signs: Per patient no change in vitals since last visit.   Review of Systems    Cardiac Risk Factors include: advanced age (>34men, >48 women);male gender;hypertension;dyslipidemia;diabetes mellitus;Other (see comment), Risk factor comments: CAD, Aortic Atherosclerosis     Objective:    Today's Vitals   06/16/23 1452  Weight: 143 lb (64.9 kg)  Height: 5\' 10"  (1.778 m)   Body mass index is 20.52 kg/m.     06/16/2023    3:00 PM 05/17/2022    2:09 PM 03/08/2021    1:47 PM 12/08/2016   10:35 PM 12/08/2016    5:08 PM 12/27/2015   10:25 AM 03/20/2015    9:59 AM  Advanced Directives  Does Patient Have a Medical Advance Directive? Yes Yes Yes No No Yes No  Type of Estate agent of [place];Living will Living will;Healthcare Power of [employer] Power of [place];Living will      Does patient want to make changes to medical advance directive?  No - Patient declined No - Patient declined      Copy of Healthcare Power of Attorney in Chart? No - copy requested No - copy requested No - copy requested      Would patient like information on creating a medical advance directive?    No - Patient declined No - Patient declined  No - patient declined information    Current Medications (verified) Outpatient Encounter Medications as of 06/16/2023  Medication Sig   ACCU-CHEK AVIVA PLUS test strip USE FOR TESTING UP TO 4 TIMES DAILY AS DIRECTED   aspirin EC 81 MG  tablet Take 81 mg by mouth daily after supper.   blood glucose meter kit and supplies Dispense based on patient and insurance preference. Use up to four times daily as directed. (FOR ICD-10 E10.9, E11.9).   Blood Glucose Monitoring Suppl (ONE TOUCH ULTRA 2) w/Device KIT Use as directed E 11.9   brimonidine (ALPHAGAN) 0.2 % ophthalmic solution Administer 1 drop to both eyes Three (3) times a day.   dorzolamide-timolol (COSOPT) 2-0.5 % ophthalmic solution Place 1 drop into both eyes 2 (two) times daily.   empagliflozin (JARDIANCE) 25 MG TABS tablet Take 1 tablet (25 mg total) by mouth daily before breakfast.   finasteride (PROSCAR) 5 MG tablet TAKE 1 TABLET (5 MG TOTAL) BY MOUTH DAILY.   gabapentin (NEURONTIN) 300 MG capsule Take 1 capsule (300 mg total) by mouth at bedtime.   glipiZIDE (GLUCOTROL XL) 5 MG 24 hr tablet TAKE 1 TABLET EVERY DAY WITH BREAKFAST   isosorbide mononitrate (IMDUR) 30 MG 24 hr tablet TAKE 1/2 OF A TABLET (15 MG TOTAL) BY MOUTH DAILY   Lancets MISC Use as directed twice daily E11.9   losartan (COZAAR) 100 MG tablet TAKE 1 TABLET BY MOUTH EVERY DAY   meclizine (ANTIVERT) 12.5 MG tablet TAKE 1 TABLET BY MOUTH 3 TIMES DAILY AS NEEDED FOR DIZZINESS.   metFORMIN (GLUCOPHAGE) 500 MG tablet Take 1,000 mg by mouth daily with breakfast. Pt takes 500mg  2 tablets once a day.  metoprolol succinate (TOPROL-XL) 25 MG 24 hr tablet TAKE 1/2 TABLET BY MOUTH EVERY DAY   nitroGLYCERIN (NITROSTAT) 0.4 MG SL tablet PLACE 1 TABLET UNDER THE TONGUE EVERY 5 MINUTES AS NEEDED FOR CHEST PAIN   omeprazole (PRILOSEC) 40 MG capsule Take 1 capsule (40 mg total) by mouth daily.   simvastatin (ZOCOR) 40 MG tablet TAKE 1 TABLET BY MOUTH EVERYDAY AT BEDTIME   [DISCONTINUED] finasteride (PROSCAR) 5 MG tablet TAKE 1 TABLET (5 MG TOTAL) BY MOUTH DAILY.   No facility-administered encounter medications on file as of 06/16/2023.    Allergies (verified) Crestor [rosuvastatin calcium] and Lovastatin    History: Past Medical History:  Diagnosis Date   Adenomatous polyp of colon 05/2004   Allergic rhinitis    Anxiety    Aortic insufficiency 03/01/2022   Echocardiogram 02/2022: EF 60-65, no RWMA, Gr 2 DD, normal RVSF, mild to mod LAE, mild MR, mild AI, AV sclerosis w/o AS   BPH (benign prostatic hyperplasia)    CAD (coronary artery disease)    s/p Cypher DES to LAD and pD1 2010;  LHC was done 6/12: EF 65%, circumflex patent, mid RCA 80-90% (small and nondominant), LAD and diagonal stents patent, LAD at the origin of the first diagonal 30%, ostial D2 90%, mid 80-90% (small vessel).  Continued medical therapy was recommended.    Carotid stenosis    dopplers 02/2012: 0-39% RICA; 40-59% LICA   Coronary artery disease involving native coronary artery of native heart with angina pectoris (HCC) 11/02/2008   Myoview 02/2022: EF 76, no ischemia or infarction; low risk     Diabetes type 2, controlled (HCC)    Diverticulosis    colon   ED (erectile dysfunction)    Esophageal stricture    GERD (gastroesophageal reflux disease)    Hemorrhoids    Hiatal hernia    Hyperlipidemia    Hypertension    IBS (irritable bowel syndrome)    PVC's (premature ventricular contractions) 01/21/2020   Monitor 07/2019: PVC burden 21.6% // Echocardiogram 01/2020: EF 65-70, no RWMA, Gr 2 DD, normal RVSF, RVSP 43.3 mmHg (mod pul HTN), mild LAE, mild MR, trivial AI   Right inguinal hernia    s/p repair in 8/12   SVT (supraventricular tachycardia)    Past Surgical History:  Procedure Laterality Date   CARDIAC CATHETERIZATION N/A 12/10/2016   Procedure: Left Heart Cath and Coronary Angiography;  Surgeon: [name], MD;  Location: [facility] INVASIVE CV LAB;  Service: Cardiovascular;  Laterality: N/A;   CARDIAC CATHETERIZATION N/A 12/10/2016   Procedure: Coronary Balloon Angioplasty;  Surgeon: [name], MD;  Location: [facility] INVASIVE CV LAB;  Service: Cardiovascular;  Laterality: N/A;   CARDIAC CATHETERIZATION N/A  12/10/2016   Procedure: Intravascular Pressure Wire/FFR Study;  Surgeon: [name], MD;  Location: [facility] INVASIVE CV LAB;  Service: Cardiovascular;  Laterality: N/A;   cataract surgery  2020   CORONARY STENT PLACEMENT  10/18/2008   x 2   ELECTROPHYSIOLOGIC STUDY N/A 03/20/2015   no inducible SVT - Dr [name] HERNIA REPAIR Right    RIH with ultrapro patch   Family History  Problem Relation Age of Onset   Bladder Cancer Mother 1   Heart attack Father 45       Deceased   Pancreatic cancer Sister 45   Healthy Son    Healthy Daughter    Colon cancer Neg Hx    Social History   Socioeconomic History   Marital status: Married  Spouse name: Gwynn   Number of children: 3   Years of education: Not on file   Highest education level: Not on file  Occupational History   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  Has 2 living children.  1 passed away in a car accident.     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     Social Determinants of Health   Financial Resource Strain: Low Risk  (06/16/2023)   Overall Financial Resource Strain (CARDIA)    Difficulty of Paying Living Expenses: Not hard at all  Food Insecurity: No Food Insecurity (06/16/2023)   Hunger Vital Sign    Worried About Running Out of Food in the Last Year: Never true    Ran Out of Food in the Last Year: Never true  Transportation  Needs: No Transportation Needs (06/16/2023)   PRAPARE - Administrator, Civil Service (Medical): No    Lack of Transportation (Non-Medical): No  Physical Activity: Insufficiently Active (06/16/2023)   Exercise Vital Sign    Days of Exercise per Week: 3 days    Minutes of Exercise per Session: 20 min  Stress: No Stress Concern Present (06/16/2023)   [employer] of Occupational Health - Occupational Stress Questionnaire    Feeling of Stress : Not at all  Social Connections: Moderately Integrated (06/16/2023)   Social Connection and Isolation Panel [NHANES]    Frequency of Communication with Friends and Family: More than three times a week    Frequency of Social Gatherings with Friends and Family: Twice a week    Attends Religious Services: More than 4 times per year    Active Member of [employer] or Organizations: No    Attends Engineer, structural: Never    Marital Status: Married    Tobacco Counseling Counseling given: Not Answered Tobacco comments: smoked in teens   Clinical Intake:  Pre-visit preparation completed: Yes        BMI - recorded: 20.52 Diabetes: Yes CBG done?: No Did pt. bring in CBG monitor from home?: No  How often do you need to have someone help you when you read instructions, pamphlets, or other written materials from your doctor or pharmacy?: 1 - Never  Interpreter Needed?: No  Information entered by :: Chin Wachter, RMA   Activities of Daily Living    06/16/2023    2:54 PM  In your present state of health, do you have any difficulty performing the following activities:  Hearing? 1  Vision? 1  Comment Had cataract surgery  Difficulty concentrating or making decisions? 0  Walking or climbing stairs? 0  Dressing or bathing? 0  Doing errands, shopping? 0  Preparing Food and eating ? N  Using the Toilet? N  In the past  six months, have you accidently leaked urine? N  Do you have problems with loss of bowel control? N   Managing your Medications? N  Managing your Finances? N  Housekeeping or managing your Housekeeping? N    Patient Care Team: [name], MD as PCP - Jerelene Redden, MD as PCP - Cardiology (Cardiology) Marinus Maw, MD as PCP - Electrophysiology (Cardiology) Kennon Rounds as Physician Assistant (Cardiology) Arville Care, MD as Referring Physician (Ophthalmology)  Indicate any recent Medical Services you may have received from other than [facility] providers in the past year (date may be approximate).     Assessment:   This is a routine wellness examination for Keith Herrera.  Hearing/Vision screen Hearing Screening - Comments:: Wears hearing aides  Dietary issues and exercise activities discussed:     Goals Addressed               This Visit's Progress     patient (pt-stated)   On track     Maintain health and stay active; continue to work in yard       Depression Screen    06/16/2023    3:03 PM 03/07/2023    1:11 PM 08/22/2022    1:51 PM 05/17/2022    2:13 PM 02/28/2022   11:19 AM 02/28/2022   10:50 AM 08/29/2021    1:04 PM  PHQ 2/9 Scores  PHQ - 2 Score 0 0 2 0 0 0 0  PHQ- 9 Score 4 2 4         Fall Risk    06/16/2023    3:00 PM 03/07/2023    1:10 PM 05/17/2022    2:11 PM 02/28/2022   11:19 AM 02/28/2022   10:50 AM  Fall Risk   Falls in the past year? 0 0 1 0 1  Number falls in past yr: 0 0 0 0 0  Injury with Fall? 0 0 0 0 0  Risk for fall due to : No Fall Risks No Fall Risks No Fall Risks    Follow up Falls prevention discussed Falls evaluation completed Falls evaluation completed      MEDICARE RISK AT HOME:  Medicare Risk at Home - 06/16/23 1500     Any stairs in or around the home? Yes    If so, are there any without handrails? Yes    Home free of loose throw rugs in walkways, pet beds, electrical cords, etc? Yes    Adequate lighting in your home to reduce risk of falls? Yes    Life alert? No    Use of a cane, walker or w/c? No     Grab bars in the bathroom? Yes    Shower chair or bench in shower? Yes    Elevated toilet seat or a handicapped toilet? Yes             TIMED UP AND GO:  Was the test performed?  No    Cognitive Function:        06/16/2023    4:37 PM 05/17/2022    2:26 PM  6CIT Screen  What Year? 0 points 0 points  What month? 0 points 0 points  What time? 0 points 0 points  Count back from 20 0 points 0 points  Months in reverse 0 points 0 points  Repeat phrase 0 points 0 points  Total Score 0 points 0 points    Immunizations Immunization History  Administered Date(s) Administered   COVID-19, mRNA, vaccine(Comirnaty)12  years and older 09/12/2022   Fluad Quad(high Dose 65+) 07/19/2019, 09/21/2020, 08/22/2022   H1N1 10/28/2008   Influenza Whole 11/30/2007, 11/18/2009   Influenza, High Dose Seasonal PF 09/21/2013, 10/04/2014, 08/19/2017   Influenza-Unspecified 06/21/2016, 08/19/2018   Moderna Sars-Covid-2 Vaccination 12/01/2019, 12/29/2019, 11/02/2020   Pneumococcal Conjugate-13 10/05/2013   Pneumococcal Polysaccharide-23 11/30/2007   Td 03/23/1998, 10/28/2008   Tdap 11/25/2018    TDAP status: Up to date  Flu Vaccine status: Up to date  Pneumococcal vaccine status: Up to date  Covid-19 vaccine status: Completed vaccines  Qualifies for Shingles Vaccine? Yes   Zostavax completed No   Shingrix Completed?: No.    Education has been provided regarding the importance of this vaccine. Patient has been advised to call insurance company to determine out of pocket expense if they have not yet received this vaccine. Advised may also receive vaccine at local pharmacy or Health Dept. Verbalized acceptance and understanding.  Screening Tests Health Maintenance  Topic Date Due   Zoster Vaccines- Shingrix (1 of 2) Never done   COVID-19 Vaccine (5 - 2023-24 season) 01/13/2023   OPHTHALMOLOGY EXAM  03/19/2023   INFLUENZA VACCINE  06/19/2023   FOOT EXAM  03/06/2024   Medicare Annual  Wellness (AWV)  06/15/2024   DTaP/Tdap/Td (4 - Td or Tdap) 11/25/2028   Pneumonia Vaccine 79+ Years old  Completed   HPV VACCINES  Aged Out    Health Maintenance  Health Maintenance Due  Topic Date Due   Zoster Vaccines- Shingrix (1 of 2) Never done   COVID-19 Vaccine (5 - 2023-24 season) 01/13/2023   OPHTHALMOLOGY EXAM  03/19/2023    Colorectal cancer screening: No longer required.   Lung Cancer Screening: (Low Dose CT Chest recommended if Age 63-80 years, 20 pack-year currently smoking OR have quit w/in 15years.) does not qualify.   Lung Cancer Screening Referral: N/A  Additional Screening:  Hepatitis C Screening: does not qualify;   Vision Screening: Recommended annual ophthalmology exams for early detection of glaucoma and other disorders of the eye. Is the patient up to date with their annual eye exam?  Yes  Who is the provider or what is the name of the office in which the patient attends annual eye exams? Arville Care, MD, MS, FACS If pt is not established with a provider, would they like to be referred to a provider to establish care? No .   Dental Screening: Recommended annual dental exams for proper oral hygiene  Diabetic Foot Exam: Diabetic Foot Exam: Completed 03/07/2023  Community Resource Referral / Chronic Care Management: CRR required this visit?  No   CCM required this visit?  No     Plan:     I have personally reviewed and noted the following in the patient's chart:   Medical and social history Use of alcohol, tobacco or illicit drugs  Current medications and supplements including opioid prescriptions. Patient is not currently taking opioid prescriptions. Functional ability and status Nutritional status Physical activity Advanced directives List of other physicians Hospitalizations, surgeries, and ER visits in previous 12 months Vitals Screenings to include cognitive, depression, and falls Referrals and appointments  In addition, I have  reviewed and discussed with patient certain preventive protocols, quality metrics, and best practice recommendations. A written personalized care plan for preventive services as well as general preventive health recommendations were provided to patient.     Mykah Shin L Edu On, CMA   06/16/2023   After Visit Summary: (Mail) Due to this being a telephonic visit, the after  visit summary with patients personalized plan was offered to patient via mail   Nurse Notes: Patient has concerns today about frequent urination.  He will like to discuss with [name] at his up coming visit.

## 2023-07-04 ENCOUNTER — Other Ambulatory Visit: Payer: Self-pay | Admitting: Internal Medicine

## 2023-07-10 ENCOUNTER — Other Ambulatory Visit: Payer: Self-pay | Admitting: Cardiovascular Disease

## 2023-07-25 ENCOUNTER — Other Ambulatory Visit: Payer: Self-pay | Admitting: Cardiovascular Disease

## 2023-08-04 ENCOUNTER — Other Ambulatory Visit: Payer: Self-pay | Admitting: Internal Medicine

## 2023-08-05 ENCOUNTER — Other Ambulatory Visit: Payer: Self-pay

## 2023-08-06 ENCOUNTER — Encounter: Payer: Self-pay | Admitting: Cardiovascular Disease

## 2023-08-06 ENCOUNTER — Ambulatory Visit: Payer: Medicare HMO | Attending: Cardiovascular Disease | Admitting: Cardiovascular Disease

## 2023-08-06 VITALS — BP 118/58 | HR 58 | Ht 70.0 in | Wt 148.6 lb

## 2023-08-06 DIAGNOSIS — E785 Hyperlipidemia, unspecified: Secondary | ICD-10-CM

## 2023-08-06 DIAGNOSIS — I10 Essential (primary) hypertension: Secondary | ICD-10-CM | POA: Diagnosis not present

## 2023-08-06 DIAGNOSIS — I25119 Atherosclerotic heart disease of native coronary artery with unspecified angina pectoris: Secondary | ICD-10-CM | POA: Diagnosis not present

## 2023-08-06 NOTE — Progress Notes (Signed)
Cardiology Office Note:    Date:  08/06/2023   ID:  THO RADICE, DOB Oct 28, 1932, MRN [id number]  PCP:  [name], MD   [facility] Providers Cardiologist:  [name], MD Cardiology APP:  [name], PA-C  Electrophysiologist:  [name], MD     Referring MD: [name], MD   Chief Complaint  Patient presents with   Coronary Artery Disease    History of Present Illness:    Keith Herrera is a 89 y.o. male with a hx of:  Coronary artery disease  S/p DES to LAD and D1 in 2010 S/p POBA to D1 in 11/2016 (no stent due to prior PCI) Carotid artery disease Aortic atherosclerosis  PSVT EPS in 2016 >> no inducible SVT PVCs Symptomatic; eval by [name] in past (tx options limited due to CAD) Monitor 07/2019: PVC burden 21.6% Hypertension  Hyperlipidemia  Diabetes mellitus 2  GERD  Esophageal stricture Prior hx of chest pain of GI origin  The patient is here alone today. He is doing well from a cardiovascular perspective. Today, he denies symptoms of chest pain, shortness of breath, orthopnea, PND, lower extremity edema, dizziness, or syncope. States that 'leg weakness' is most limiting to him. He does have occasional heart palpitations.  He is still able to get out in his yard and do some light work.  He still driving without any major functional limitation.    Past Medical History:  Diagnosis Date   Adenomatous polyp of colon 05/2004   Allergic rhinitis    Anxiety    Aortic insufficiency 03/01/2022   Echocardiogram 02/2022: EF 60-65, no RWMA, Gr 2 DD, normal RVSF, mild to mod LAE, mild MR, mild AI, AV sclerosis w/o AS   BPH (benign prostatic hyperplasia)    CAD (coronary artery disease)    s/p Cypher DES to LAD and pD1 2010;  LHC was done 6/12: EF 65%, circumflex patent, mid RCA 80-90% (small and nondominant), LAD and diagonal stents patent, LAD at the origin of the first diagonal 30%, ostial D2 90%, mid 80-90% (small vessel).  Continued  medical therapy was recommended.    Carotid stenosis    dopplers 02/2012: 0-39% RICA; 40-59% LICA   Coronary artery disease involving native coronary artery of native heart with angina pectoris (HCC) 11/02/2008   Myoview 02/2022: EF 76, no ischemia or infarction; low risk     Diabetes type 2, controlled (HCC)    Diverticulosis    colon   ED (erectile dysfunction)    Esophageal stricture    GERD (gastroesophageal reflux disease)    Hemorrhoids    Hiatal hernia    Hyperlipidemia    Hypertension    IBS (irritable bowel syndrome)    PVC's (premature ventricular contractions) 01/21/2020   Monitor 07/2019: PVC burden 21.6% // Echocardiogram 01/2020: EF 65-70, no RWMA, Gr 2 DD, normal RVSF, RVSP 43.3 mmHg (mod pul HTN), mild LAE, mild MR, trivial AI   Right inguinal hernia    s/p repair in 8/12   SVT (supraventricular tachycardia)     Past Surgical History:  Procedure Laterality Date   CARDIAC CATHETERIZATION N/A 12/10/2016   Procedure: Left Heart Cath and Coronary Angiography;  Surgeon: [name], MD;  Location: [facility] INVASIVE CV LAB;  Service: Cardiovascular;  Laterality: N/A;   CARDIAC CATHETERIZATION N/A 12/10/2016   Procedure: Coronary Balloon Angioplasty;  Surgeon: [name], MD;  Location: [facility] INVASIVE CV LAB;  Service: Cardiovascular;  Laterality: N/A;  CARDIAC CATHETERIZATION N/A 12/10/2016   Procedure: Intravascular Pressure Wire/FFR Study;  Surgeon: [name], MD;  Location: [facility] INVASIVE CV LAB;  Service: Cardiovascular;  Laterality: N/A;   cataract surgery  2020   CORONARY STENT PLACEMENT  10/18/2008   x 2   ELECTROPHYSIOLOGIC STUDY N/A 03/20/2015   no inducible SVT - Dr [name] HERNIA REPAIR Right    RIH with ultrapro patch    Current Medications: Current Meds  Medication Sig   ACCU-CHEK AVIVA PLUS test strip USE FOR TESTING UP TO 4 TIMES DAILY AS DIRECTED   aspirin EC 81 MG tablet Take 81 mg by mouth daily after supper.   blood glucose  meter kit and supplies Dispense based on patient and insurance preference. Use up to four times daily as directed. (FOR ICD-10 E10.9, E11.9).   Blood Glucose Monitoring Suppl (ONE TOUCH ULTRA 2) w/Device KIT Use as directed E 11.9   brimonidine (ALPHAGAN) 0.2 % ophthalmic solution Administer 1 drop to both eyes Three (3) times a day.   dorzolamide-timolol (COSOPT) 2-0.5 % ophthalmic solution Place 1 drop into both eyes 2 (two) times daily.   empagliflozin (JARDIANCE) 25 MG TABS tablet Take 1 tablet (25 mg total) by mouth daily before breakfast.   finasteride (PROSCAR) 5 MG tablet TAKE 1 TABLET (5 MG TOTAL) BY MOUTH DAILY.   gabapentin (NEURONTIN) 300 MG capsule TAKE 1 CAPSULE BY MOUTH EVERYDAY AT BEDTIME   glipiZIDE (GLUCOTROL XL) 5 MG 24 hr tablet TAKE 1 TABLET BY MOUTH EVERY DAY WITH BREAKFAST   isosorbide mononitrate (IMDUR) 30 MG 24 hr tablet TAKE 1/2 OF A TABLET (15 MG TOTAL) BY MOUTH DAILY   Lancets MISC Use as directed twice daily E11.9   losartan (COZAAR) 100 MG tablet TAKE 1 TABLET BY MOUTH EVERY DAY   meclizine (ANTIVERT) 12.5 MG tablet TAKE 1 TABLET BY MOUTH 3 TIMES DAILY AS NEEDED FOR DIZZINESS.   metFORMIN (GLUCOPHAGE) 500 MG tablet Take 1,000 mg by mouth daily with breakfast. Pt takes 500mg  2 tablets once a day.   metoprolol succinate (TOPROL-XL) 25 MG 24 hr tablet TAKE 1/2 TABLET BY MOUTH DAILY   nitroGLYCERIN (NITROSTAT) 0.4 MG SL tablet PLACE 1 TABLET UNDER THE TONGUE EVERY 5 MINUTES AS NEEDED FOR CHEST PAIN   omeprazole (PRILOSEC) 40 MG capsule TAKE 1 CAPSULE (40 MG TOTAL) BY MOUTH DAILY.   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  Has 2 living children.  1 passed away in a car accident.     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     Social Determinants of Health   Financial Resource Strain: Low Risk  (06/16/2023)   Overall Financial Resource Strain (CARDIA)    Difficulty of Paying Living Expenses: Not hard at all  Food Insecurity: No Food Insecurity (06/16/2023)   Hunger Vital Sign    Worried About Running Out of Food in the Last Year: Never true    Ran Out of Food in the Last Year: Never true  Transportation Needs: No Transportation Needs (06/16/2023)   PRAPARE - Administrator, Civil Service (Medical): No    Lack of Transportation (Non-Medical): No  Physical Activity: Insufficiently Active (06/16/2023)   Exercise Vital Sign    Days of Exercise per Week: 3 days    Minutes of Exercise per Session: 20 min  Stress: No Stress Concern Present (06/16/2023)   [employer] of Occupational Health - Occupational Stress Questionnaire    Feeling of Stress : Not at all  Social Connections: Moderately Integrated (06/16/2023)   Social Connection and Isolation Panel [NHANES]    Frequency of Communication with Friends and Family: More than three times a week    Frequency of Social Gatherings with Friends and Family: Twice a week    Attends Religious Services: More than 4 times per year    Active Member of [employer] or Organizations: No     Attends Banker Meetings: Never    Marital Status: Married     Family History: The patient's family history includes Bladder Cancer (age of onset: 40) in his mother; Healthy in his daughter and son; Heart attack (age of onset: 40) in his father; Pancreatic cancer (age of onset: 34) in his sister. There is no history of Colon cancer.  ROS:   Please see the history of present illness.     All other systems reviewed and are negative.  EKGs/Labs/Other Studies Reviewed:         Recent Labs: 03/07/2023: BUN 21; Creatinine, Ser 1.16; Hemoglobin 16.1; Platelets 158.0; Potassium 4.5; Sodium 140; TSH 2.24 06/11/2023: ALT 17  Recent Lipid Panel    Component Value Date/Time   CHOL 135 06/11/2023 0858   TRIG 175 (H) 06/11/2023 0858   TRIG 128 11/19/2006 1032   HDL 33 (L) 06/11/2023 0858   CHOLHDL 4.1 06/11/2023 0858   CHOLHDL 4 03/07/2023 1344   VLDL 44.8 (H) 03/07/2023 1344   LDLCALC 72 06/11/2023 0858   LDLCALC 79 05/29/2020 1443   LDLDIRECT 65.0 03/07/2023 1344     Risk Assessment/Calculations:                Physical Exam:    VS:  BP (!) 118/58 (BP Location: Left Arm, Patient Position: Sitting, Cuff Size: Normal)   Pulse (!) 58   Ht 5\' 10"  (1.778 m)   Wt 148 lb 9.6 oz (67.4 kg)   SpO2 98%   BMI 21.32 kg/m     Wt Readings from Last 3 Encounters:  08/06/23 148 lb 9.6 oz (67.4 kg)  06/16/23 143 lb (64.9 kg)  04/16/23 143 lb 6.4 oz (65 kg)     GEN:  Well nourished, well developed pleasant elderly male in no acute distress HEENT: Normal NECK: No JVD; No carotid bruits LYMPHATICS: No lymphadenopathy CARDIAC: RRR, no murmurs, rubs, gallops RESPIRATORY:  Clear to auscultation without rales, wheezing or rhonchi  ABDOMEN: Soft, non-tender, non-distended MUSCULOSKELETAL:  No edema; No deformity  SKIN: Warm and dry NEUROLOGIC:  Alert and oriented x 3 PSYCHIATRIC:  Normal affect   ASSESSMENT:    1. Coronary artery disease involving native coronary  artery of native heart with angina pectoris (HCC)   2. Hyperlipidemia LDL goal <70   3. Essential hypertension    PLAN:    In order of problems listed above:  The patient is clinically stable on Imdur and metoprolol for antianginal therapy.  He continues on aspirin and statin drug as well.  I will see him back in 1 year for follow-up evaluation. Treated with simvastatin.  Cholesterol is 135, LDL 72, HDL 33.  At his advanced age of 89 years old I think is lipid management is appropriate and he has been clinically stable for many years now.  Will continue the same program and see him back in 1 year. Blood pressure is well-controlled on a combination of losartan, isosorbide, metoprolol.  Follow-up 1 year.  Labs reviewed with creatinine of 1.16 and potassium of 4.5.           Medication Adjustments/Labs and Tests Ordered: Current medicines are reviewed at length with the patient today.  Concerns regarding medicines are outlined above.  No orders of the defined types were placed in this encounter.  No orders of the defined types were placed in this encounter.   Patient Instructions  Medication Instructions:  Your physician recommends that you continue on your current medications as directed. Please refer to the Current Medication list given to you today.  *If you need a refill on your cardiac medications before your next appointment, please call your pharmacy*   Lab Work: NONE If you have labs (blood work) drawn today and your tests are completely normal, you will receive your results only by: MyChart Message (if you have MyChart) OR A paper copy in the mail If you have any lab test that is abnormal or we need to change your treatment, we will call you to review the results.   Testing/Procedures: NONE   Follow-Up: At [facility], you and your health needs are our priority.  As part of our continuing mission to provide you with exceptional heart care, we have created  designated Provider Care Teams.  These Care Teams include your primary Cardiologist (physician) and Advanced Practice Providers (APPs -  Physician Assistants and Nurse Practitioners) who all work together to provide you with the care you need, when you need it.  We recommend signing up for the patient portal called "MyChart".  Sign up information is provided on this After Visit Summary.  MyChart is used to connect with patients for Virtual Visits (Telemedicine).  Patients are able to view lab/test results, encounter notes, upcoming appointments, etc.  Non-urgent messages can be sent to your provider as well.   To learn more about what you can do with MyChart, go to [email or web address].    Your next appointment:   6 months  Provider:   [name], MD        Signed, [name], MD  08/06/2023 10:48 AM    [facility]

## 2023-08-06 NOTE — Patient Instructions (Addendum)
Medication Instructions:  Your physician recommends that you continue on your current medications as directed. Please refer to the Current Medication list given to you today.  *If you need a refill on your cardiac medications before your next appointment, please call your pharmacy*   Lab Work: NONE If you have labs (blood work) drawn today and your tests are completely normal, you will receive your results only by: MyChart Message (if you have MyChart) OR A paper copy in the mail If you have any lab test that is abnormal or we need to change your treatment, we will call you to review the results.   Testing/Procedures: NONE   Follow-Up: At [facility], you and your health needs are our priority.  As part of our continuing mission to provide you with exceptional heart care, we have created designated Provider Care Teams.  These Care Teams include your primary Cardiologist (physician) and Advanced Practice Providers (APPs -  Physician Assistants and Nurse Practitioners) who all work together to provide you with the care you need, when you need it.  We recommend signing up for the patient portal called "MyChart".  Sign up information is provided on this After Visit Summary.  MyChart is used to connect with patients for Virtual Visits (Telemedicine).  Patients are able to view lab/test results, encounter notes, upcoming appointments, etc.  Non-urgent messages can be sent to your provider as well.   To learn more about what you can do with MyChart, go to [email or web address].    Your next appointment:   6 month(s)  Provider:   [name], MD

## 2023-08-19 DIAGNOSIS — H01006 Unspecified blepharitis left eye, unspecified eyelid: Secondary | ICD-10-CM | POA: Diagnosis not present

## 2023-08-19 DIAGNOSIS — Z961 Presence of intraocular lens: Secondary | ICD-10-CM | POA: Diagnosis not present

## 2023-08-19 DIAGNOSIS — H401333 Pigmentary glaucoma, bilateral, severe stage: Secondary | ICD-10-CM | POA: Diagnosis not present

## 2023-08-19 DIAGNOSIS — H01003 Unspecified blepharitis right eye, unspecified eyelid: Secondary | ICD-10-CM | POA: Diagnosis not present

## 2023-08-19 DIAGNOSIS — H5319 Other subjective visual disturbances: Secondary | ICD-10-CM | POA: Diagnosis not present

## 2023-09-08 ENCOUNTER — Encounter: Payer: Self-pay | Admitting: Internal Medicine

## 2023-09-08 ENCOUNTER — Ambulatory Visit (INDEPENDENT_AMBULATORY_CARE_PROVIDER_SITE_OTHER): Payer: Medicare HMO | Admitting: Internal Medicine

## 2023-09-08 ENCOUNTER — Other Ambulatory Visit: Payer: Self-pay | Admitting: Internal Medicine

## 2023-09-08 VITALS — BP 130/72 | HR 59 | Temp 98.0°F | Ht 70.0 in | Wt 149.0 lb

## 2023-09-08 DIAGNOSIS — E1159 Type 2 diabetes mellitus with other circulatory complications: Secondary | ICD-10-CM | POA: Diagnosis not present

## 2023-09-08 DIAGNOSIS — I10 Essential (primary) hypertension: Secondary | ICD-10-CM | POA: Diagnosis not present

## 2023-09-08 DIAGNOSIS — E559 Vitamin D deficiency, unspecified: Secondary | ICD-10-CM

## 2023-09-08 DIAGNOSIS — Z7984 Long term (current) use of oral hypoglycemic drugs: Secondary | ICD-10-CM

## 2023-09-08 DIAGNOSIS — L299 Pruritus, unspecified: Secondary | ICD-10-CM | POA: Diagnosis not present

## 2023-09-08 DIAGNOSIS — Z23 Encounter for immunization: Secondary | ICD-10-CM | POA: Diagnosis not present

## 2023-09-08 LAB — BASIC METABOLIC PANEL
BUN: 21 mg/dL (ref 6–23)
CO2: 28 meq/L (ref 19–32)
Calcium: 9.5 mg/dL (ref 8.4–10.5)
Chloride: 104 meq/L (ref 96–112)
Creatinine, Ser: 1 mg/dL (ref 0.40–1.50)
GFR: 66.74 mL/min (ref >60.00)
Glucose, Bld: 242 mg/dL — ABNORMAL HIGH (ref 70–99)
Potassium: 4.4 meq/L (ref 3.5–5.1)
Sodium: 140 meq/L (ref 135–145)

## 2023-09-08 LAB — HEPATIC FUNCTION PANEL
ALT: 13 U/L (ref 0–53)
AST: 13 U/L (ref 0–37)
Albumin: 3.8 g/dL (ref 3.5–5.2)
Alkaline Phosphatase: 55 U/L (ref 39–117)
Bilirubin, Direct: 0.1 mg/dL (ref 0.0–0.3)
Total Bilirubin: 0.5 mg/dL (ref 0.2–1.2)
Total Protein: 6.4 g/dL (ref 6.0–8.3)

## 2023-09-08 LAB — LIPID PANEL
Cholesterol: 122 mg/dL (ref 0–200)
HDL: 31.4 mg/dL — ABNORMAL LOW (ref >39.00)
LDL Cholesterol: 46 mg/dL (ref 0–99)
NonHDL: 90.7
Total CHOL/HDL Ratio: 4
Triglycerides: 222 mg/dL — ABNORMAL HIGH (ref 0.0–149.0)
VLDL: 44.4 mg/dL — ABNORMAL HIGH (ref 0.0–40.0)

## 2023-09-08 LAB — VITAMIN D 25 HYDROXY (VIT D DEFICIENCY, FRACTURES): VITD: 23.05 ng/mL — ABNORMAL LOW (ref 30.00–100.00)

## 2023-09-08 LAB — HEMOGLOBIN A1C: Hgb A1c MFr Bld: 10 % — ABNORMAL HIGH (ref 4.6–6.5)

## 2023-09-08 MED ORDER — HYDROXYZINE PAMOATE 25 MG PO CAPS
ORAL_CAPSULE | ORAL | 2 refills | Status: DC
Start: 2023-09-08 — End: 2023-10-01

## 2023-09-08 MED ORDER — GLIPIZIDE ER 10 MG PO TB24: 10.0000 mg | ORAL_TABLET | Freq: Every day | ORAL | 3 refills | Status: DC

## 2023-09-08 MED ORDER — GABAPENTIN 300 MG PO CAPS: 600.0000 mg | ORAL_CAPSULE | Freq: Every day | ORAL | 1 refills | Status: DC

## 2023-09-08 NOTE — Progress Notes (Unsigned)
Patient ID: Keith Herrera, male   DOB: 1932-04-13, 89 y.o.   MRN: [id number]        Chief Complaint: follow up insomnia due to itching at night and LE neuritic pain, gait difficulty, dm, low vit d, htn       HPI:  Keith Herrera is a 89 y.o. male here with above, wrosening in the past month without rash or swelling, cough or congestion.  Pt denies chest pain, increased sob or doe, wheezing, orthopnea, PND, increased LE swelling, palpitations, dizziness or syncope.   Pt denies polydipsia, polyuria, or new focal neuro s/s.   Has some worsening gait as well, has been avoiding starting cane use but may be reconsidering in the past week.  Unfortunately unable to afford jardiance for now in the donut hole.   Wt Readings from Last 3 Encounters:  09/08/23 149 lb (67.6 kg)  08/06/23 148 lb 9.6 oz (67.4 kg)  06/16/23 143 lb (64.9 kg)   BP Readings from Last 3 Encounters:  09/08/23 130/72  08/06/23 (!) 118/58  04/16/23 132/62         Past Medical History:  Diagnosis Date   Adenomatous polyp of colon 05/2004   Allergic rhinitis    Anxiety    Aortic insufficiency 03/01/2022   Echocardiogram 02/2022: EF 60-65, no RWMA, Gr 2 DD, normal RVSF, mild to mod LAE, mild MR, mild AI, AV sclerosis w/o AS   BPH (benign prostatic hyperplasia)    CAD (coronary artery disease)    s/p Cypher DES to LAD and pD1 2010;  LHC was done 6/12: EF 65%, circumflex patent, mid RCA 80-90% (small and nondominant), LAD and diagonal stents patent, LAD at the origin of the first diagonal 30%, ostial D2 90%, mid 80-90% (small vessel).  Continued medical therapy was recommended.    Carotid stenosis    dopplers 02/2012: 0-39% RICA; 40-59% LICA   Coronary artery disease involving native coronary artery of native heart with angina pectoris (HCC) 11/02/2008   Myoview 02/2022: EF 76, no ischemia or infarction; low risk     Diabetes type 2, controlled (HCC)    Diverticulosis    colon   ED (erectile dysfunction)    Esophageal stricture     GERD (gastroesophageal reflux disease)    Hemorrhoids    Hiatal hernia    Hyperlipidemia    Hypertension    IBS (irritable bowel syndrome)    PVC's (premature ventricular contractions) 01/21/2020   Monitor 07/2019: PVC burden 21.6% // Echocardiogram 01/2020: EF 65-70, no RWMA, Gr 2 DD, normal RVSF, RVSP 43.3 mmHg (mod pul HTN), mild LAE, mild MR, trivial AI   Right inguinal hernia    s/p repair in 8/12   SVT (supraventricular tachycardia) (HCC)    Past Surgical History:  Procedure Laterality Date   CARDIAC CATHETERIZATION N/A 12/10/2016   Procedure: Left Heart Cath and Coronary Angiography;  Surgeon: [name], MD;  Location: [facility] INVASIVE CV LAB;  Service: Cardiovascular;  Laterality: N/A;   CARDIAC CATHETERIZATION N/A 12/10/2016   Procedure: Coronary Balloon Angioplasty;  Surgeon: [name], MD;  Location: [facility] INVASIVE CV LAB;  Service: Cardiovascular;  Laterality: N/A;   CARDIAC CATHETERIZATION N/A 12/10/2016   Procedure: Intravascular Pressure Wire/FFR Study;  Surgeon: [name], MD;  Location: [facility] INVASIVE CV LAB;  Service: Cardiovascular;  Laterality: N/A;   cataract surgery  2020   CORONARY STENT PLACEMENT  10/18/2008   x 2   ELECTROPHYSIOLOGIC STUDY N/A 03/20/2015   no  inducible SVT - Dr [name] HERNIA REPAIR Right    RIH with ultrapro patch    reports that he quit smoking about 67 years ago. His smoking use included cigarettes. He has never used smokeless tobacco. He reports that he does not drink alcohol and does not use drugs. family history includes Bladder Cancer (age of onset: 5) in his mother; Healthy in his daughter and son; Heart attack (age of onset: 51) in his father; Pancreatic cancer (age of onset: 55) in his sister. Allergies  Allergen Reactions   Crestor [Rosuvastatin Calcium] Rash    All over body   Lovastatin Itching and Rash   Current Outpatient Medications on File Prior to Visit  Medication Sig Dispense Refill    ACCU-CHEK AVIVA PLUS test strip USE FOR TESTING UP TO 4 TIMES DAILY AS DIRECTED 100 strip 2   aspirin EC 81 MG tablet Take 81 mg by mouth daily after supper.     blood glucose meter kit and supplies Dispense based on patient and insurance preference. Use up to four times daily as directed. (FOR ICD-10 E10.9, E11.9). 1 each 0   Blood Glucose Monitoring Suppl (ONE TOUCH ULTRA 2) w/Device KIT Use as directed E 11.9 1 each 0   brimonidine (ALPHAGAN) 0.2 % ophthalmic solution Administer 1 drop to both eyes Three (3) times a day.     dorzolamide-timolol (COSOPT) 2-0.5 % ophthalmic solution Place 1 drop into both eyes 2 (two) times daily.     empagliflozin (JARDIANCE) 25 MG TABS tablet Take 1 tablet (25 mg total) by mouth daily before breakfast. 90 tablet 3   finasteride (PROSCAR) 5 MG tablet TAKE 1 TABLET (5 MG TOTAL) BY MOUTH DAILY. 90 tablet 3   isosorbide mononitrate (IMDUR) 30 MG 24 hr tablet TAKE 1/2 OF A TABLET (15 MG TOTAL) BY MOUTH DAILY 45 tablet 2   Lancets MISC Use as directed twice daily E11.9 200 each 3   losartan (COZAAR) 100 MG tablet TAKE 1 TABLET BY MOUTH EVERY DAY 90 tablet 3   meclizine (ANTIVERT) 12.5 MG tablet TAKE 1 TABLET BY MOUTH 3 TIMES DAILY AS NEEDED FOR DIZZINESS. 90 tablet 0   metFORMIN (GLUCOPHAGE) 500 MG tablet Take 1,000 mg by mouth daily with breakfast. Pt takes 500mg  2 tablets once a day.     metoprolol succinate (TOPROL-XL) 25 MG 24 hr tablet TAKE 1/2 TABLET BY MOUTH DAILY 45 tablet 2   nitroGLYCERIN (NITROSTAT) 0.4 MG SL tablet PLACE 1 TABLET UNDER THE TONGUE EVERY 5 MINUTES AS NEEDED FOR CHEST PAIN 25 tablet 2   omeprazole (PRILOSEC) 40 MG capsule TAKE 1 CAPSULE (40 MG TOTAL) BY MOUTH DAILY. 90 capsule 3   simvastatin (ZOCOR) 40 MG tablet TAKE 1 TABLET BY MOUTH EVERYDAY AT BEDTIME 90 tablet 3   No current facility-administered medications on file prior to visit.        ROS:  All others reviewed and negative.  Objective        PE:  BP 130/72 (BP Location: Right  Arm, Patient Position: Sitting, Cuff Size: Normal)   Pulse (!) 59   Temp 98 F (36.7 C) (Oral)   Ht 5\' 10"  (1.778 m)   Wt 149 lb (67.6 kg)   SpO2 99%   BMI 21.38 kg/m                 Constitutional: Pt appears in NAD               HENT:  Head: NCAT.                Right Ear: External ear normal.                 Left Ear: External ear normal.                Eyes: . Pupils are equal, round, and reactive to light. Conjunctivae and EOM are normal               Nose: without d/c or deformity               Neck: Neck supple. Gross normal ROM               Cardiovascular: Normal rate and regular rhythm.                 Pulmonary/Chest: Effort normal and breath sounds without rales or wheezing.                Abd:  Soft, NT, ND, + BS, no organomegaly               Neurological: Pt is alert. At baseline orientation, motor grossly intact               Skin: Skin is warm. No rashes, no other new lesions, LE edema - none               Psychiatric: Pt behavior is normal without agitation   Micro: none  Cardiac tracings I have personally interpreted today:  none  Pertinent Radiological findings (summarize): none   Lab Results  Component Value Date   WBC 6.5 03/07/2023   HGB 16.1 03/07/2023   HCT 48.3 03/07/2023   PLT 158.0 03/07/2023   GLUCOSE 242 (H) 09/08/2023   CHOL 122 09/08/2023   TRIG 222.0 (H) 09/08/2023   HDL 31.40 (L) 09/08/2023   LDLDIRECT 65.0 03/07/2023   LDLCALC 46 09/08/2023   ALT 13 09/08/2023   AST 13 09/08/2023   NA 140 09/08/2023   K 4.4 09/08/2023   CL 104 09/08/2023   CREATININE 1.00 09/08/2023   BUN 21 09/08/2023   CO2 28 09/08/2023   TSH 2.24 03/07/2023   PSA 2.49 12/21/2012   INR 1.15 12/10/2016   HGBA1C 10.0 (H) 09/08/2023   MICROALBUR 1.3 03/07/2023   Assessment/Plan:  OLIVANDER LANGHAM is a 89 y.o. White or Caucasian [1] male with  has a past medical history of Adenomatous polyp of colon (05/2004), Allergic rhinitis, Anxiety, Aortic insufficiency  (03/01/2022), BPH (benign prostatic hyperplasia), CAD (coronary artery disease), Carotid stenosis, Coronary artery disease involving native coronary artery of native heart with angina pectoris (HCC) (11/02/2008), Diabetes type 2, controlled (HCC), Diverticulosis, ED (erectile dysfunction), Esophageal stricture, GERD (gastroesophageal reflux disease), Hemorrhoids, Hiatal hernia, Hyperlipidemia, Hypertension, IBS (irritable bowel syndrome), PVC's (premature ventricular contractions) (01/21/2020), Right inguinal hernia, and SVT (supraventricular tachycardia) (HCC).  Vitamin D deficiency Last vitamin D Lab Results  Component Value Date   VD25OH 23.05 (L) 09/08/2023   Low, to start replacement   Type 2 diabetes mellitus with vascular disease (HCC) Lab Results  Component Value Date   HGBA1C 10.0 (H) 09/08/2023   Uncontrolled unable to afford jardiance recently, for increased glucotrol xl to 10 mg every day,  pt to continue current medical treatment metformin 1000 bid   Pruritus Also for atarax 25- 50 mg at bedtime prn  Essential hypertension BP Readings from Last 3 Encounters:  09/08/23 130/72  08/06/23 Marland Kitchen)  118/58  04/16/23 132/62   Stable, pt to continue medical treatment losartan 100 every day, toprol xl 12.5 qd  Followup: Return in about 6 months (around 03/08/2024).  Oliver Barre, MD 09/10/2023 8:56 PM [facility]

## 2023-09-08 NOTE — Patient Instructions (Addendum)
You had the flu shot today  Please take all new medication as prescribed  - the hydroxyzine for itch and sleep  Ok to increase the gabapentin to 2 capsules at bedtime if the hydroxyzine does not work well for sleep  Ok to stay off the jardiance for now due to cost, but we can plan to restart in Jan 2025  Please highly consider walking with a cane outside the home, and call if you feel you need referral to Physical Therapy  Please continue all other medications as before, and refills have been done if requested.  Please have the pharmacy call with any other refills you may need.  Please keep your appointments with your specialists as you may have planned  Please go to the LAB at the blood drawing area for the tests to be done  You will be contacted by phone if any changes need to be made immediately.  Otherwise, you will receive a letter about your results with an explanation, but please check with MyChart first.  Please make an Appointment to return in 6 months, or sooner if needed

## 2023-09-10 ENCOUNTER — Encounter: Payer: Self-pay | Admitting: Internal Medicine

## 2023-09-10 NOTE — Assessment & Plan Note (Signed)
Last vitamin D Lab Results  Component Value Date   VD25OH 23.05 (L) 09/08/2023   Low, to start replacement

## 2023-09-10 NOTE — Assessment & Plan Note (Signed)
BP Readings from Last 3 Encounters:  09/08/23 130/72  08/06/23 (!) 118/58  04/16/23 132/62   Stable, pt to continue medical treatment losartan 100 every day, toprol xl 12.5 qd

## 2023-09-10 NOTE — Assessment & Plan Note (Signed)
Also for atarax 25- 50 mg at bedtime prn

## 2023-09-10 NOTE — Assessment & Plan Note (Signed)
Lab Results  Component Value Date   HGBA1C 10.0 (H) 09/08/2023   Uncontrolled unable to afford jardiance recently, for increased glucotrol xl to 10 mg every day,  pt to continue current medical treatment metformin 1000 bid

## 2023-09-29 ENCOUNTER — Ambulatory Visit: Payer: Medicare HMO | Admitting: Cardiovascular Disease

## 2023-10-01 ENCOUNTER — Other Ambulatory Visit: Payer: Self-pay

## 2023-10-01 ENCOUNTER — Other Ambulatory Visit: Payer: Self-pay | Admitting: Internal Medicine

## 2023-10-17 ENCOUNTER — Other Ambulatory Visit: Payer: Self-pay | Admitting: Internal Medicine

## 2023-11-04 ENCOUNTER — Other Ambulatory Visit: Payer: Self-pay | Admitting: Internal Medicine

## 2023-11-04 ENCOUNTER — Other Ambulatory Visit: Payer: Self-pay

## 2023-11-05 ENCOUNTER — Other Ambulatory Visit: Payer: Self-pay

## 2023-11-05 NOTE — Telephone Encounter (Signed)
Copied from CRM [id number]. Topic: Clinical - Medication Refill >> Nov 05, 2023  2:46 PM Hector Shade B wrote: Most Recent Primary Care Visit:  Provider: [name]  Department: [facility]  Visit Type: OFFICE VISIT  Date: 09/08/2023  Medication:  gabapentin (NEURONTIN) 300 MG capsule   Has the patient contacted their pharmacy? Yes (Agent: If no, request that the patient contact the pharmacy for the refill. If patient does not wish to contact the pharmacy document the reason why and proceed with request.) (Agent: If yes, when and what did the pharmacy advise?) Advised the patient to call the provider's office to request the refill  Is this the correct pharmacy for this prescription? Yes If no, delete pharmacy and type the correct one.  This is the patient's preferred pharmacy:  [id number] - [place], [place] - [address] [place] [address] Phone: [phone] Fax: [phone]  [facility] - [place], [place] - [address] [place] [address] Phone: [phone] Fax: [phone]   Has the prescription been filled recently? Yes  Is the patient out of the medication? Yes  Has the patient been seen for an appointment in the last year OR does the patient have an upcoming appointment? Yes  Can we respond through MyChart? No  Agent: Please be advised that Rx refills may take up to 3 business days. We ask that you follow-up with your pharmacy.

## 2023-11-06 ENCOUNTER — Other Ambulatory Visit: Payer: Self-pay

## 2023-11-06 ENCOUNTER — Other Ambulatory Visit: Payer: Self-pay | Admitting: Internal Medicine

## 2023-12-22 ENCOUNTER — Ambulatory Visit (INDEPENDENT_AMBULATORY_CARE_PROVIDER_SITE_OTHER): Payer: Medicare HMO

## 2023-12-22 ENCOUNTER — Ambulatory Visit: Payer: Medicare HMO | Admitting: Internal Medicine

## 2023-12-22 ENCOUNTER — Encounter: Payer: Self-pay | Admitting: Internal Medicine

## 2023-12-22 VITALS — BP 132/70 | HR 64 | Temp 97.8°F | Ht 70.0 in | Wt 149.0 lb

## 2023-12-22 DIAGNOSIS — E1159 Type 2 diabetes mellitus with other circulatory complications: Secondary | ICD-10-CM

## 2023-12-22 DIAGNOSIS — E559 Vitamin D deficiency, unspecified: Secondary | ICD-10-CM

## 2023-12-22 DIAGNOSIS — R051 Acute cough: Secondary | ICD-10-CM | POA: Diagnosis not present

## 2023-12-22 DIAGNOSIS — Z0001 Encounter for general adult medical examination with abnormal findings: Secondary | ICD-10-CM

## 2023-12-22 DIAGNOSIS — R918 Other nonspecific abnormal finding of lung field: Secondary | ICD-10-CM | POA: Diagnosis not present

## 2023-12-22 DIAGNOSIS — Z7984 Long term (current) use of oral hypoglycemic drugs: Secondary | ICD-10-CM

## 2023-12-22 DIAGNOSIS — E785 Hyperlipidemia, unspecified: Secondary | ICD-10-CM | POA: Diagnosis not present

## 2023-12-22 DIAGNOSIS — Z23 Encounter for immunization: Secondary | ICD-10-CM

## 2023-12-22 DIAGNOSIS — R0602 Shortness of breath: Secondary | ICD-10-CM | POA: Diagnosis not present

## 2023-12-22 DIAGNOSIS — L299 Pruritus, unspecified: Secondary | ICD-10-CM

## 2023-12-22 DIAGNOSIS — I10 Essential (primary) hypertension: Secondary | ICD-10-CM | POA: Diagnosis not present

## 2023-12-22 DIAGNOSIS — R062 Wheezing: Secondary | ICD-10-CM | POA: Diagnosis not present

## 2023-12-22 DIAGNOSIS — I700 Atherosclerosis of aorta: Secondary | ICD-10-CM | POA: Diagnosis not present

## 2023-12-22 LAB — BASIC METABOLIC PANEL
BUN: 22 mg/dL (ref 6–23)
CO2: 28 meq/L (ref 19–32)
Calcium: 9 mg/dL (ref 8.4–10.5)
Chloride: 105 meq/L (ref 96–112)
Creatinine, Ser: 0.95 mg/dL (ref 0.40–1.50)
GFR: 70.83 mL/min (ref >60.00)
Glucose, Bld: 184 mg/dL — ABNORMAL HIGH (ref 70–99)
Potassium: 4.2 meq/L (ref 3.5–5.1)
Sodium: 141 meq/L (ref 135–145)

## 2023-12-22 LAB — CBC WITH DIFFERENTIAL/PLATELET
Basophils Absolute: 0 10*3/uL (ref 0.0–0.1)
Basophils Relative: 0.5 % (ref 0.0–3.0)
Eosinophils Absolute: 0.3 10*3/uL (ref 0.0–0.7)
Eosinophils Relative: 3.2 % (ref 0.0–5.0)
HCT: 41.6 % (ref 39.0–52.0)
Hemoglobin: 14.1 g/dL (ref 13.0–17.0)
Lymphocytes Relative: 9 % — ABNORMAL LOW (ref 12.0–46.0)
Lymphs Abs: 0.8 10*3/uL (ref 0.7–4.0)
MCHC: 33.8 g/dL (ref 30.0–36.0)
MCV: 89.8 fL (ref 78.0–100.0)
Monocytes Absolute: 0.6 10*3/uL (ref 0.1–1.0)
Monocytes Relative: 7.6 % (ref 3.0–12.0)
Neutro Abs: 6.7 10*3/uL (ref 1.4–7.7)
Neutrophils Relative %: 79.7 % — ABNORMAL HIGH (ref 43.0–77.0)
Platelets: 255 10*3/uL (ref 150.0–400.0)
RBC: 4.63 Mil/uL (ref 4.22–5.81)
RDW: 13.6 % (ref 11.5–15.5)
WBC: 8.4 10*3/uL (ref 4.0–10.5)

## 2023-12-22 LAB — MICROALBUMIN / CREATININE URINE RATIO
Creatinine,U: 163.8 mg/dL
Microalb Creat Ratio: 8.4 mg/g (ref 0.0–30.0)
Microalb, Ur: 13.8 mg/dL — ABNORMAL HIGH (ref 0.0–1.9)

## 2023-12-22 LAB — HEPATIC FUNCTION PANEL
ALT: 20 U/L (ref 0–53)
AST: 16 U/L (ref 0–37)
Albumin: 3.9 g/dL (ref 3.5–5.2)
Alkaline Phosphatase: 64 U/L (ref 39–117)
Bilirubin, Direct: 0.1 mg/dL (ref 0.0–0.3)
Total Bilirubin: 0.4 mg/dL (ref 0.2–1.2)
Total Protein: 6.5 g/dL (ref 6.0–8.3)

## 2023-12-22 LAB — TSH: TSH: 3.18 u[IU]/mL (ref 0.35–5.50)

## 2023-12-22 LAB — LIPID PANEL
Cholesterol: 101 mg/dL (ref 0–200)
HDL: 29.5 mg/dL — ABNORMAL LOW (ref >39.00)
LDL Cholesterol: 24 mg/dL (ref 0–99)
NonHDL: 71.2
Total CHOL/HDL Ratio: 3
Triglycerides: 235 mg/dL — ABNORMAL HIGH (ref 0.0–149.0)
VLDL: 47 mg/dL — ABNORMAL HIGH (ref 0.0–40.0)

## 2023-12-22 LAB — HEMOGLOBIN A1C: Hgb A1c MFr Bld: 9.7 % — ABNORMAL HIGH (ref 4.6–6.5)

## 2023-12-22 MED ORDER — TRIAMCINOLONE ACETONIDE 0.1 % EX CREA: 1.0000 | TOPICAL_CREAM | Freq: Two times a day (BID) | CUTANEOUS | 1 refills | Status: AC

## 2023-12-22 MED ORDER — CEFDINIR 300 MG PO CAPS: 300.0000 mg | ORAL_CAPSULE | Freq: Two times a day (BID) | ORAL | 0 refills | Status: AC

## 2023-12-22 MED ORDER — HYDROCODONE BIT-HOMATROP MBR 5-1.5 MG/5ML PO SOLN: 5.0000 mL | Freq: Four times a day (QID) | ORAL | 0 refills | Status: AC | PRN

## 2023-12-22 MED ORDER — CEFTRIAXONE SODIUM 1 G IJ SOLR
1.0000 g | Freq: Once | INTRAMUSCULAR | Status: AC
Start: 2023-12-22 — End: 2023-12-22
  Administered 2023-12-22: 1 g via INTRAMUSCULAR

## 2023-12-22 MED ORDER — PREDNISONE 10 MG PO TABS: ORAL_TABLET | ORAL | 0 refills | Status: AC

## 2023-12-22 NOTE — Assessment & Plan Note (Addendum)
Mild to mod, for rocephin 1 gm I'm, for antibx course omnicef 250 bid, cough med prn, for cxr,  to f/u any worsening symptoms or concerns

## 2023-12-22 NOTE — Progress Notes (Signed)
Patient ID: Keith Herrera, male   DOB: [birth year], 89 y.o.   MRN: [id number]         Chief Complaint:: wellness exam and Cough (Since Friday, denies any other sx. Unable to sleep) With wheezing, dm, chronic itching, htn, hld       HPI:  Keith Herrera is a 89 y.o. male here for wellness exam; declines covid booster, o/w up to date                        Also Here with acute onset mild to mod 4 days ST, HA, general weakness and malaise, with prod cough greenish sputum, but Pt denies chest pain, increased sob or doe, wheezing, orthopnea, PND, increased LE swelling, palpitations, dizziness or syncope, except for mild wheezing since last pm.  Pt denies polydipsia, polyuria, or new focal neuro s/s.    Pt denies recent wt loss, night sweats, loss of appetite, or other constitutional symptoms  also has itching chronic all over without rash, asks for refill triam cr in jar form. Due for prevnar 20   Wt Readings from Last 3 Encounters:  12/22/23 149 lb (67.6 kg)  09/08/23 149 lb (67.6 kg)  08/06/23 148 lb 9.6 oz (67.4 kg)   BP Readings from Last 3 Encounters:  12/22/23 132/70  09/08/23 130/72  08/06/23 (!) 118/58   Immunization History  Administered Date(s) Administered   Fluad Quad(high Dose 65+) 07/19/2019, 09/21/2020, 08/22/2022   Fluad Trivalent(High Dose 65+) 09/08/2023   H1N1 10/28/2008   Influenza Whole 11/30/2007, 11/18/2009   Influenza, High Dose Seasonal PF 09/21/2013, 10/04/2014, 08/19/2017   Influenza-Unspecified 06/21/2016, 08/19/2018   Moderna SARS-COV2 Booster Vaccination 04/19/2021   Moderna Sars-Covid-2 Vaccination 12/01/2019, 12/29/2019, 11/02/2020   PNEUMOCOCCAL CONJUGATE-20 12/22/2023   Pfizer(Comirnaty)Fall Seasonal Vaccine 12 years and older 09/12/2022   Pneumococcal Conjugate-13 10/05/2013   Pneumococcal Polysaccharide-23 11/30/2007   Td 03/23/1998, 10/28/2008   Tdap 11/25/2018   Zoster Recombinant(Shingrix) 06/20/2023, 09/02/2023   Health Maintenance Due   Topic Date Due   COVID-19 Vaccine (6 - 2024-25 season) 07/20/2023      Past Medical History:  Diagnosis Date   Adenomatous polyp of colon 05/2004   Allergic rhinitis    Anxiety    Aortic insufficiency 03/01/2022   Echocardiogram 02/2022: EF 60-65, no RWMA, Gr 2 DD, normal RVSF, mild to mod LAE, mild MR, mild AI, AV sclerosis w/o AS   BPH (benign prostatic hyperplasia)    CAD (coronary artery disease)    s/p Cypher DES to LAD and pD1 2010;  LHC was done 6/12: EF 65%, circumflex patent, mid RCA 80-90% (small and nondominant), LAD and diagonal stents patent, LAD at the origin of the first diagonal 30%, ostial D2 90%, mid 80-90% (small vessel).  Continued medical therapy was recommended.    Carotid stenosis    dopplers 02/2012: 0-39% RICA; 40-59% LICA   Coronary artery disease involving native coronary artery of native heart with angina pectoris (HCC) 11/02/2008   Myoview 02/2022: EF 76, no ischemia or infarction; low risk     Diabetes type 2, controlled (HCC)    Diverticulosis    colon   ED (erectile dysfunction)    Esophageal stricture    GERD (gastroesophageal reflux disease)    Hemorrhoids    Hiatal hernia    Hyperlipidemia    Hypertension    IBS (irritable bowel syndrome)    PVC's (premature ventricular contractions) 01/21/2020   Monitor 07/2019: PVC burden 21.6% //  Echocardiogram 01/2020: EF 65-70, no RWMA, Gr 2 DD, normal RVSF, RVSP 43.3 mmHg (mod pul HTN), mild LAE, mild MR, trivial AI   Right inguinal hernia    s/p repair in 8/12   SVT (supraventricular tachycardia) (HCC)    Past Surgical History:  Procedure Laterality Date   CARDIAC CATHETERIZATION N/A 12/10/2016   Procedure: Left Heart Cath and Coronary Angiography;  Surgeon: [name], MD;  Location: [facility] INVASIVE CV LAB;  Service: Cardiovascular;  Laterality: N/A;   CARDIAC CATHETERIZATION N/A 12/10/2016   Procedure: Coronary Balloon Angioplasty;  Surgeon: [name], MD;  Location: [facility] INVASIVE CV LAB;   Service: Cardiovascular;  Laterality: N/A;   CARDIAC CATHETERIZATION N/A 12/10/2016   Procedure: Intravascular Pressure Wire/FFR Study;  Surgeon: [name], MD;  Location: [facility] INVASIVE CV LAB;  Service: Cardiovascular;  Laterality: N/A;   cataract surgery  2020   CORONARY STENT PLACEMENT  10/18/2008   x 2   ELECTROPHYSIOLOGIC STUDY N/A 03/20/2015   no inducible SVT - Dr [name] HERNIA REPAIR Right    RIH with ultrapro patch    reports that he quit smoking about 67 years ago. His smoking use included cigarettes. He has never used smokeless tobacco. He reports that he does not drink alcohol and does not use drugs. family history includes Bladder Cancer (age of onset: 22) in his mother; Healthy in his daughter and son; Heart attack (age of onset: 4) in his father; Pancreatic cancer (age of onset: 36) in his sister. Allergies  Allergen Reactions   Crestor [Rosuvastatin Calcium] Rash    All over body   Lovastatin Itching and Rash   Current Outpatient Medications on File Prior to Visit  Medication Sig Dispense Refill   ACCU-CHEK AVIVA PLUS test strip USE FOR TESTING UP TO 4 TIMES DAILY AS DIRECTED 100 strip 2   aspirin EC 81 MG tablet Take 81 mg by mouth daily after supper.     blood glucose meter kit and supplies Dispense based on patient and insurance preference. Use up to four times daily as directed. (FOR ICD-10 E10.9, E11.9). 1 each 0   Blood Glucose Monitoring Suppl (ONE TOUCH ULTRA 2) w/Device KIT Use as directed E 11.9 1 each 0   brimonidine (ALPHAGAN) 0.2 % ophthalmic solution Administer 1 drop to both eyes Three (3) times a day.     dorzolamide-timolol (COSOPT) 2-0.5 % ophthalmic solution Place 1 drop into both eyes 2 (two) times daily.     empagliflozin (JARDIANCE) 25 MG TABS tablet Take 1 tablet (25 mg total) by mouth daily before breakfast. 90 tablet 3   finasteride (PROSCAR) 5 MG tablet TAKE 1 TABLET (5 MG TOTAL) BY MOUTH DAILY. 90 tablet 3   gabapentin  (NEURONTIN) 300 MG capsule Take 2 capsules (600 mg total) by mouth at bedtime. 180 capsule 1   glipiZIDE (GLUCOTROL XL) 10 MG 24 hr tablet Take 1 tablet (10 mg total) by mouth daily with breakfast. 90 tablet 3   hydrOXYzine (VISTARIL) 25 MG capsule TAKE 1-2 CAPSULES BY MOUTH AT BEDTIME AS NEEDED FOR ITCHING AND SLEEP 180 capsule 1   isosorbide mononitrate (IMDUR) 30 MG 24 hr tablet TAKE 1/2 OF A TABLET (15 MG TOTAL) BY MOUTH DAILY 45 tablet 2   Lancets MISC Use as directed twice daily E11.9 200 each 3   losartan (COZAAR) 100 MG tablet TAKE 1 TABLET BY MOUTH EVERY DAY 90 tablet 3   meclizine (ANTIVERT) 12.5 MG tablet TAKE 1 TABLET  BY MOUTH 3 TIMES DAILY AS NEEDED FOR DIZZINESS. 90 tablet 0   metFORMIN (GLUCOPHAGE) 500 MG tablet Take 1,000 mg by mouth daily with breakfast. Pt takes 500mg  2 tablets once a day.     metFORMIN (GLUCOPHAGE-XR) 500 MG 24 hr tablet TAKE 2 TABLETS BY MOUTH EVERY DAY WITH BREAKFAST 180 tablet 3   metoprolol succinate (TOPROL-XL) 25 MG 24 hr tablet TAKE 1/2 TABLET BY MOUTH DAILY 45 tablet 2   nitroGLYCERIN (NITROSTAT) 0.4 MG SL tablet PLACE 1 TABLET UNDER THE TONGUE EVERY 5 MINUTES AS NEEDED FOR CHEST PAIN 25 tablet 2   omeprazole (PRILOSEC) 40 MG capsule TAKE 1 CAPSULE (40 MG TOTAL) BY MOUTH DAILY. 90 capsule 3   simvastatin (ZOCOR) 40 MG tablet TAKE 1 TABLET BY MOUTH EVERYDAY AT BEDTIME 90 tablet 3   No current facility-administered medications on file prior to visit.        ROS:  All others reviewed and negative.  Objective        PE:  BP 132/70 (BP Location: Left Arm, Patient Position: Sitting, Cuff Size: Large)   Pulse 64   Temp 97.8 F (36.6 C) (Temporal)   Ht 5\' 10"  (1.778 m)   Wt 149 lb (67.6 kg)   SpO2 97%   BMI 21.38 kg/m                 Constitutional: Pt appears mild ill               HENT: Head: NCAT.                Right Ear: External ear normal.                 Left Ear: External ear normal. Bilat tm's with mild erythema.  Max sinus areas non  tender.  Pharynx with mild erythema, no exudate                Eyes: . Pupils are equal, round, and reactive to light. Conjunctivae and EOM are normal               Nose: without d/c or deformity               Neck: Neck supple. Gross normal ROM               Cardiovascular: Normal rate and regular rhythm.                 Pulmonary/Chest: Effort normal and breath sounds decreased without rales but with few bilat rhonchi wheezing.                Abd:  Soft, NT, ND, + BS, no organomegaly               Neurological: Pt is alert. At baseline orientation, motor grossly intact               Skin: Skin is warm. No rashes, no other new lesions, LE edema - none               Psychiatric: Pt behavior is normal without agitation   Micro: none  Cardiac tracings I have personally interpreted today:  none  Pertinent Radiological findings (summarize): none   Lab Results  Component Value Date   WBC 6.5 03/07/2023   HGB 16.1 03/07/2023   HCT 48.3 03/07/2023   PLT 158.0 03/07/2023   GLUCOSE 242 (H) 09/08/2023   CHOL 122 09/08/2023   TRIG 222.0 (  H) 09/08/2023   HDL 31.40 (L) 09/08/2023   LDLDIRECT 65.0 03/07/2023   LDLCALC 46 09/08/2023   ALT 13 09/08/2023   AST 13 09/08/2023   NA 140 09/08/2023   K 4.4 09/08/2023   CL 104 09/08/2023   CREATININE 1.00 09/08/2023   BUN 21 09/08/2023   CO2 28 09/08/2023   TSH 2.24 03/07/2023   PSA 2.49 12/21/2012   INR 1.15 12/10/2016   HGBA1C 10.0 (H) 09/08/2023   MICROALBUR 1.3 03/07/2023   Assessment/Plan:  Keith Herrera is a 89 y.o. White or Caucasian [1] male with  has a past medical history of Adenomatous polyp of colon (05/2004), Allergic rhinitis, Anxiety, Aortic insufficiency (03/01/2022), BPH (benign prostatic hyperplasia), CAD (coronary artery disease), Carotid stenosis, Coronary artery disease involving native coronary artery of native heart with angina pectoris (HCC) (11/02/2008), Diabetes type 2, controlled (HCC), Diverticulosis, ED (erectile  dysfunction), Esophageal stricture, GERD (gastroesophageal reflux disease), Hemorrhoids, Hiatal hernia, Hyperlipidemia, Hypertension, IBS (irritable bowel syndrome), PVC's (premature ventricular contractions) (01/21/2020), Right inguinal hernia, and SVT (supraventricular tachycardia) (HCC).  Encounter for well adult exam with abnormal findings Age and sex appropriate education and counseling updated with regular exercise and diet Referrals for preventative services - none needed Immunizations addressed - decliens covid booster Smoking counseling  - none needed Evidence for depression or other mood disorder - none significant Most recent labs reviewed. I have personally reviewed and have noted: 1) the patient's medical and social history 2) The patient's current medications and supplements 3) The patient's height, weight, and BMI have been recorded in the chart   Cough Mild to mod, for rocephin 1 gm I'm, for antibx course omnicef 250 bid, cough med prn, for cxr,  to f/u any worsening symptoms or concerns  Essential hypertension BP Readings from Last 3 Encounters:  12/22/23 132/70  09/08/23 130/72  08/06/23 (!) 118/58   Stable, pt to continue medical treatment losartan 100 every day, toprol xl 12.5 qd   Hyperlipidemia LDL goal <70 Lab Results  Component Value Date   LDLCALC 46 09/08/2023   Stable, pt to continue current statin zocor 40 qd   Pruritus For triam cr prn  Type 2 diabetes mellitus with vascular disease (HCC) Lab Results  Component Value Date   HGBA1C 10.0 (H) 09/08/2023   Previously uncontrolled now on increased glucotrol xl 10, pt to continue current medical treatment and jardiance 25 every day, metformin 1000 mg qd   Vitamin D deficiency Last vitamin D Lab Results  Component Value Date   VD25OH 23.05 (L) 09/08/2023   Low, to start oral replacement   Wheezing Mild to mod, for prednisone taper prn,,  to f/u any worsening symptoms or concerns  Followup:  Return in about 11 weeks (around 03/08/2024).  Oliver Barre, MD 12/22/2023 8:27 PM [facility]

## 2023-12-22 NOTE — Assessment & Plan Note (Signed)
Mild to mod, for prednisone taper prn,,  to f/u any worsening symptoms or concerns

## 2023-12-22 NOTE — Assessment & Plan Note (Signed)
Lab Results  Component Value Date   HGBA1C 10.0 (H) 09/08/2023   Previously uncontrolled now on increased glucotrol xl 10, pt to continue current medical treatment and jardiance 25 every day, metformin 1000 mg qd

## 2023-12-22 NOTE — Assessment & Plan Note (Signed)
Lab Results  Component Value Date   LDLCALC 46 09/08/2023   Stable, pt to continue current statin zocor 40 qd

## 2023-12-22 NOTE — Patient Instructions (Addendum)
You had the Prevnar 20 pneumonia shot today  You had the antibiotic shot today (rocephin) which is a cousin to penicillin  Please take all new medication as prescribed - the antibiotic, cough medicine, and short course of prednisone  Please continue all other medications as before, and refills have been done if requested.  Please have the pharmacy call with any other refills you may need.  Please keep your appointments with your specialists as you may have planned  Please go to the XRAY Department in the first floor for the x-ray testing  Please go to the LAB at the blood drawing area for the tests to be done  You will be contacted by phone if any changes need to be made immediately.  Otherwise, you will receive a letter about your results with an explanation, but please check with MyChart first.  Please make an Appointment to return in Apr 21, or sooner if needed

## 2023-12-22 NOTE — Assessment & Plan Note (Signed)
BP Readings from Last 3 Encounters:  12/22/23 132/70  09/08/23 130/72  08/06/23 (!) 118/58   Stable, pt to continue medical treatment losartan 100 every day, toprol xl 12.5 qd

## 2023-12-22 NOTE — Assessment & Plan Note (Signed)
For triam cr prn 

## 2023-12-22 NOTE — Assessment & Plan Note (Signed)
Last vitamin D Lab Results  Component Value Date   VD25OH 23.05 (L) 09/08/2023   Low, to start oral replacement

## 2023-12-22 NOTE — Assessment & Plan Note (Signed)
Age and sex appropriate education and counseling updated with regular exercise and diet Referrals for preventative services - none needed Immunizations addressed - decliens covid booster Smoking counseling  - none needed Evidence for depression or other mood disorder - none significant Most recent labs reviewed. I have personally reviewed and have noted: 1) the patient's medical and social history 2) The patient's current medications and supplements 3) The patient's height, weight, and BMI have been recorded in the chart

## 2023-12-23 LAB — URINALYSIS, ROUTINE W REFLEX MICROSCOPIC
Hgb urine dipstick: NEGATIVE
Leukocytes,Ua: NEGATIVE
Nitrite: NEGATIVE
RBC / HPF: NONE SEEN (ref 0–?)
Specific Gravity, Urine: >=1.03 — AB (ref 1.000–1.030)
Total Protein, Urine: 30 — AB
Urine Glucose: >=1000 — AB
Urobilinogen, UA: 0.2 (ref 0.0–1.0)
pH: 5 (ref 5.0–8.0)

## 2023-12-25 ENCOUNTER — Other Ambulatory Visit: Payer: Self-pay | Admitting: Internal Medicine

## 2023-12-25 ENCOUNTER — Encounter: Payer: Self-pay | Admitting: Internal Medicine

## 2023-12-25 DIAGNOSIS — E1159 Type 2 diabetes mellitus with other circulatory complications: Secondary | ICD-10-CM

## 2024-01-04 ENCOUNTER — Encounter: Payer: Self-pay | Admitting: Internal Medicine

## 2024-01-07 NOTE — Telephone Encounter (Signed)
 Copied from CRM [id number]. Topic: Clinical - Prescription Issue >> Jan 07, 2024  4:26 PM Fuller Mandril wrote: Reason for CRM: Patient called states when he saw provider last he ordered cough medication 2/3. Not sure what the name was or if syrup or pill but states pharmacy still does not have medication available. Checking to see if something else can be sent in. Thank You

## 2024-01-08 MED ORDER — PROMETHAZINE-DM 6.25-15 MG/5ML PO SYRP: 5.0000 mL | ORAL_SOLUTION | Freq: Four times a day (QID) | ORAL | 1 refills | Status: AC | PRN

## 2024-01-14 DIAGNOSIS — E1165 Type 2 diabetes mellitus with hyperglycemia: Secondary | ICD-10-CM | POA: Diagnosis not present

## 2024-01-14 DIAGNOSIS — E785 Hyperlipidemia, unspecified: Secondary | ICD-10-CM | POA: Diagnosis not present

## 2024-01-14 DIAGNOSIS — I10 Essential (primary) hypertension: Secondary | ICD-10-CM | POA: Diagnosis not present

## 2024-01-22 ENCOUNTER — Encounter: Payer: Self-pay | Admitting: Cardiovascular Disease

## 2024-01-22 ENCOUNTER — Ambulatory Visit: Payer: Medicare HMO | Attending: Cardiovascular Disease | Admitting: Cardiovascular Disease

## 2024-01-22 VITALS — BP 142/64 | HR 61 | Resp 16 | Ht 70.0 in | Wt 140.6 lb

## 2024-01-22 DIAGNOSIS — E785 Hyperlipidemia, unspecified: Secondary | ICD-10-CM | POA: Diagnosis not present

## 2024-01-22 DIAGNOSIS — I10 Essential (primary) hypertension: Secondary | ICD-10-CM

## 2024-01-22 DIAGNOSIS — I25119 Atherosclerotic heart disease of native coronary artery with unspecified angina pectoris: Secondary | ICD-10-CM | POA: Diagnosis not present

## 2024-01-22 NOTE — Progress Notes (Signed)
 Cardiology Office Note:    Date:  01/22/2024   ID:  SYNCERE EBLE, DOB 1932/09/07, MRN [id number]  PCP:  [name], MD   [facility] Providers Cardiologist:  [name], MD Cardiology APP:  [name], PA-C  Electrophysiologist:  [name], MD     Referring MD: [name], MD   Chief Complaint  Patient presents with   Coronary artery disease involving native coronary artery of   Follow-up    6 months    History of Present Illness:    Keith Herrera is a 89 y.o. male with a hx of:  Coronary artery disease  S/p DES to LAD and D1 in 2010 S/p POBA to D1 in 11/2016 (no stent due to prior PCI) Carotid artery disease Aortic atherosclerosis  PSVT EPS in 2016 >> no inducible SVT PVCs Symptomatic; eval by [name] in past (tx options limited due to CAD) Monitor 07/2019: PVC burden 21.6% Hypertension  Hyperlipidemia  Diabetes mellitus 2 - oral hypoglycemics. HgB A1C 02/25 = 9.7 GERD  Esophageal stricture Prior hx of chest pain of GI origin  The patient is here alone today.  He has been doing pretty well from a cardiac perspective and he denies any symptoms of chest pain, chest pressure, or heart palpitations.  He has no leg swelling, shortness of breath, orthopnea, or PND.  He reports that his blood glucose has been high in he was started on some new medication recently.  It looks like he is on a combination of metformin, glipizide, and empagliflozin.  Hemoglobin A1c was 9.7.  His other labs look good with a creatinine of 0.95, cholesterol 101, HDL 30, and LDL 24.  Triglycerides are elevated at 235, reflective of his poor glycemic control.  States that he is eating too many sweets and snacks.  He has lost about 10 pounds and attributes this to his aging.  He is about to celebrate his 89th birthday next month.  He is still driving and getting around well.  Current Medications: Current Meds  Medication Sig   ACCU-CHEK AVIVA PLUS test strip USE FOR  TESTING UP TO 4 TIMES DAILY AS DIRECTED   aspirin EC 81 MG tablet Take 81 mg by mouth daily after supper.   blood glucose meter kit and supplies Dispense based on patient and insurance preference. Use up to four times daily as directed. (FOR ICD-10 E10.9, E11.9).   Blood Glucose Monitoring Suppl (ONE TOUCH ULTRA 2) w/Device KIT Use as directed E 11.9   brimonidine (ALPHAGAN) 0.2 % ophthalmic solution Administer 1 drop to both eyes Three (3) times a day.   brimonidine-timolol (COMBIGAN) 0.2-0.5 % ophthalmic solution Place into both eyes every 12 (twelve) hours.   cefdinir (OMNICEF) 300 MG capsule Take 1 capsule (300 mg total) by mouth 2 (two) times daily.   empagliflozin (JARDIANCE) 25 MG TABS tablet Take 1 tablet (25 mg total) by mouth daily before breakfast.   finasteride (PROSCAR) 5 MG tablet TAKE 1 TABLET (5 MG TOTAL) BY MOUTH DAILY.   gabapentin (NEURONTIN) 300 MG capsule Take 2 capsules (600 mg total) by mouth at bedtime.   glipiZIDE (GLUCOTROL XL) 10 MG 24 hr tablet Take 1 tablet (10 mg total) by mouth daily with breakfast.   hydrOXYzine (VISTARIL) 25 MG capsule TAKE 1-2 CAPSULES BY MOUTH AT BEDTIME AS NEEDED FOR ITCHING AND SLEEP   isosorbide mononitrate (IMDUR) 30 MG 24 hr tablet TAKE 1/2 OF A TABLET (15 MG TOTAL) BY  MOUTH DAILY   Lancets MISC Use as directed twice daily E11.9   latanoprost (XALATAN) 0.005 % ophthalmic solution Place 1 drop into both eyes at bedtime.   losartan (COZAAR) 100 MG tablet TAKE 1 TABLET BY MOUTH EVERY DAY   meclizine (ANTIVERT) 12.5 MG tablet TAKE 1 TABLET BY MOUTH 3 TIMES DAILY AS NEEDED FOR DIZZINESS.   metFORMIN (GLUCOPHAGE-XR) 500 MG 24 hr tablet TAKE 2 TABLETS BY MOUTH EVERY DAY WITH BREAKFAST   metoprolol succinate (TOPROL-XL) 25 MG 24 hr tablet TAKE 1/2 TABLET BY MOUTH DAILY   nitroGLYCERIN (NITROSTAT) 0.4 MG SL tablet PLACE 1 TABLET UNDER THE TONGUE EVERY 5 MINUTES AS NEEDED FOR CHEST PAIN   omeprazole (PRILOSEC) 40 MG capsule TAKE 1 CAPSULE (40 MG  TOTAL) BY MOUTH DAILY.   predniSONE (DELTASONE) 10 MG tablet 2 tabs by mouth per day for 5 days   promethazine-dextromethorphan (PROMETHAZINE-DM) 6.25-15 MG/5ML syrup Take 5 mLs by mouth 4 (four) times daily as needed.   simvastatin (ZOCOR) 40 MG tablet TAKE 1 TABLET BY MOUTH EVERYDAY AT BEDTIME   triamcinolone cream (KENALOG) 0.1 % Apply 1 Application topically 2 (two) times daily.     Allergies:   Crestor [rosuvastatin calcium] and Lovastatin   ROS:   Please see the history of present illness.    All other systems reviewed and are negative.  EKGs/Labs/Other Studies Reviewed:    The following studies were reviewed today: Cardiac Studies & Procedures   ______________________________________________________________________________________________ CARDIAC CATHETERIZATION  CARDIAC CATHETERIZATION 12/10/2016  Narrative  Non-dominant vessel, Mid RCA lesion, 100 %stenosed. This has progressed since 2012. There are right to right and left to right collaterals.  Patent stent in the first Diag.  Patent proximal LAD stent.  Ost 2nd Diag to mid 2nd Diag lesion, 75 %stenosed. Stable disease since 2012.  1st Diag lesion, 80 %stenosed. This lesion was past the prior stent. After PTCA with a 2.0 balloon, a stent could not be delivered due to the prior LAD and diagional stents.  Post intervention with PTCA, there is a 10% residual stenosis.  LV end diastolic pressure is normal.  There is no aortic valve stenosis.  50% mid LAD lesion which was assessed by FFR. Resting FFR was 0.89 and after adenosine, FFR dropped to 0.82.  Continue dual antiplatelet therapy for at least a month. No stent was actually placed. The stent could not be delivered due to the prior LAD and diagonal stents. There is an excellent angioplasty result.  FFR of the mid LAD was negative. Continue aggressive medical therapy. Can consider continuing clopidogrel long-term.  Findings Coronary Findings Diagnostic   Dominance: Left  Left Anterior Descending Previously placed Prox LAD to Mid LAD drug eluting stent is widely patent.  First Diagonal Branch Vessel is small in size. Previously placed Ost 1st Diag to 1st Diag drug eluting stent is widely patent.  Second Diagonal Branch Vessel is small in size.  Left Circumflex The vessel exhibits minimal luminal irregularities.  Right Coronary Artery Vessel is small. Collaterals Dist RCA filled by collaterals from Acute Mrg.  The lesion is chronically occluded with left-to-right and right-to-right collateral flow.  Right Posterior Descending Artery Collaterals RPDA filled by collaterals from Dist LAD.  Intervention  1st Diag lesion Angioplasty Lesion crossed with guidewire using a WIRE ASAHI PROWATER 180CM. Angioplasty alone was performed using a BALLOON MOZEC 2.0X12. Maximum pressure: 10 atm. The pre-interventional distal flow is normal (TIMI 3).  The post-interventional distal flow is normal (TIMI 3). The intervention was successful .  No complications occurred at this lesion. Unable to deliver 2.25 Promus stent through the prior stented area. There is a 10% residual stenosis post intervention.   STRESS TESTS  MYOCARDIAL PERFUSION IMAGING 03/01/2022  Narrative   The study is normal. The study is low risk.   No ST deviation was noted.   LV perfusion is normal. There is no evidence of ischemia. There is no evidence of infarction.   Left ventricular function is normal. Nuclear stress EF: 76 %. End diastolic cavity size is normal. End systolic cavity size is normal.   Prior study available for comparison from 12/09/2016.   ECHOCARDIOGRAM  ECHOCARDIOGRAM COMPLETE 03/01/2022  Narrative ECHOCARDIOGRAM REPORT    Patient Name:   SABASTION HRDLICKA Date of Exam: 03/01/2022 Medical Rec #:  [id number]        Height:       70.0 in Accession #:    [id number]       Weight:       158.0 lb Date of Birth:  1931/12/27        BSA:          1.888  m Patient Age:    89 years         BP:           118/50 mmHg Patient Gender: M                HR:           57 bpm. Exam Location:  [address]  Procedure: 2D Echo, Cardiac Doppler and Color Doppler  Indications:    R06.02 SOB  History:        Patient has prior history of Echocardiogram examinations, most recent 01/20/2020. CAD, Arrythmias:PVC, Signs/Symptoms:Shortness of Breath and Chest Pain; Risk Factors:Former Smoker, Diabetes, Hypertension and Dyslipidemia.  Sonographer:    Samule Ohm RDCS Referring Phys: [id number] Ever[address] Bio WEAVER  IMPRESSIONS   1. Left ventricular ejection fraction, by estimation, is 60 to 65%. The left ventricle has normal function. The left ventricle has no regional wall motion abnormalities. There is mild left ventricular hypertrophy. Left ventricular diastolic parameters are consistent with Grade II diastolic dysfunction (pseudonormalization). 2. Right ventricular systolic function is normal. The right ventricular size is normal. 3. Left atrial size was mild to moderately dilated. 4. Posterior leaflet calcified. Mild mitral valve regurgitation. 5. The tricuspid valve is degenerative. 6. Aortic valve regurgitation is mild. Aortic valve sclerosis/calcification is present, without any evidence of aortic stenosis. 7. The inferior vena cava is normal in size with greater than 50% respiratory variability, suggesting right atrial pressure of 3 mmHg.  Comparison(s): No significant change from prior study.  FINDINGS Left Ventricle: Left ventricular ejection fraction, by estimation, is 60 to 65%. The left ventricle has normal function. The left ventricle has no regional wall motion abnormalities. The left ventricular internal cavity size was normal in size. There is mild left ventricular hypertrophy. Left ventricular diastolic parameters are consistent with Grade II diastolic dysfunction (pseudonormalization).  Right Ventricle: The right ventricular size is normal.  No increase in right ventricular wall thickness. Right ventricular systolic function is normal.  Left Atrium: Left atrial size was mild to moderately dilated.  Right Atrium: Right atrial size was normal in size. Prominent Eustachian valve.  Pericardium: There is no evidence of pericardial effusion.  Mitral Valve: Posterior leaflet calcified. Mild mitral valve regurgitation.  Tricuspid Valve: The tricuspid valve is degenerative in appearance. Tricuspid valve regurgitation is mild.  Aortic Valve: Aortic valve regurgitation  is mild. Aortic regurgitation PHT measures 476 msec. Aortic valve sclerosis/calcification is present, without any evidence of aortic stenosis.  Pulmonic Valve: The pulmonic valve was grossly normal. Pulmonic valve regurgitation is not visualized.  Aorta: The aortic root and ascending aorta are structurally normal, with no evidence of dilitation.  Venous: The inferior vena cava is normal in size with greater than 50% respiratory variability, suggesting right atrial pressure of 3 mmHg.  IAS/Shunts: No atrial level shunt detected by color flow Doppler.   LEFT VENTRICLE PLAX 2D LVIDd:         4.00 cm   Diastology LVIDs:         2.50 cm   LV e' medial:    7.62 cm/s LV PW:         1.20 cm   LV E/e' medial:  14.2 LV IVS:        1.10 cm   LV e' lateral:   7.61 cm/s LVOT diam:     1.80 cm   LV E/e' lateral: 14.2 LV SV:         62 LV SV Index:   33 LVOT Area:     2.54 cm   RIGHT VENTRICLE             IVC RV S prime:     15.30 cm/s  IVC diam: 1.60 cm TAPSE (M-mode): 2.8 cm RVSP:           46.0 mmHg  LEFT ATRIUM             Index        RIGHT ATRIUM           Index LA diam:        4.00 cm 2.12 cm/m   RA Pressure: 3.00 mmHg LA Vol (A2C):   43.7 ml 23.14 ml/m  RA Area:     18.10 cm LA Vol (A4C):   58.5 ml 30.98 ml/m  RA Volume:   50.80 ml  26.90 ml/m LA Biplane Vol: 52.2 ml 27.64 ml/m AORTIC VALVE LVOT Vmax:   110.67 cm/s LVOT Vmean:  69.667 cm/s LVOT VTI:     0.243 m AI PHT:      476 msec  AORTA Ao Root diam: 3.00 cm Ao Asc diam:  3.40 cm  MITRAL VALVE                TRICUSPID VALVE MV Area (PHT): 3.46 cm     TR Peak grad:   43.0 mmHg MV Decel Time: 219 msec     TR Vmax:        328.00 cm/s MV E velocity: 108.00 cm/s  Estimated RAP:  3.00 mmHg MV A velocity: 114.00 cm/s  RVSP:           46.0 mmHg MV E/A ratio:  0.95 SHUNTS Systemic VTI:  0.24 m Systemic Diam: 1.80 cm  Carolan Clines Electronically signed by Carolan Clines Signature Date/Time: 03/01/2022/11:39:08 AM    Final    MONITORS  LONG TERM MONITOR (3-14 DAYS) 08/09/2019  Narrative 1) the basic rhythm is normal sinus with an average HR of 60 bpm 2) there are occasional PAC's with short supraventricular runs, longest 12 beats 3) frequent PVC's with PVC burden of 21.6%, but no ventricular runs 4) no pathologic pauses 5) no atrial fibrillation or flutter       ______________________________________________________________________________________________      EKG:        Recent Labs: 12/22/2023: ALT 20; BUN 22; Creatinine, Ser  0.95; Hemoglobin 14.1; Platelets 255.0; Potassium 4.2; Sodium 141; TSH 3.18  Recent Lipid Panel    Component Value Date/Time   CHOL 101 12/22/2023 1615   CHOL 135 06/11/2023 0858   TRIG 235.0 (H) 12/22/2023 1615   TRIG 128 11/19/2006 1032   HDL 29.50 (L) 12/22/2023 1615   HDL 33 (L) 06/11/2023 0858   CHOLHDL 3 12/22/2023 1615   VLDL 47.0 (H) 12/22/2023 1615   LDLCALC 24 12/22/2023 1615   LDLCALC 72 06/11/2023 0858   LDLCALC 79 05/29/2020 1443   LDLDIRECT 65.0 03/07/2023 1344           Physical Exam:    VS:  BP (!) 142/64 (BP Location: Right Arm, Patient Position: Sitting, Cuff Size: Normal)   Pulse 61   Resp 16   Ht 5\' 10"  (1.778 m)   Wt 140 lb 9.6 oz (63.8 kg)   SpO2 97%   BMI 20.17 kg/m     Wt Readings from Last 3 Encounters:  01/22/24 140 lb 9.6 oz (63.8 kg)  12/22/23 149 lb (67.6 kg)  09/08/23 149 lb (67.6 kg)      GEN:  Well nourished, well developed pleasant elderly male in no acute distress HEENT: Normal NECK: No JVD; No carotid bruits LYMPHATICS: No lymphadenopathy CARDIAC: RRR, no murmurs, rubs, gallops RESPIRATORY:  Clear to auscultation without rales, wheezing or rhonchi  ABDOMEN: Soft, non-tender, non-distended MUSCULOSKELETAL:  No edema; No deformity  SKIN: Warm and dry NEUROLOGIC:  Alert and oriented x 3 PSYCHIATRIC:  Normal affect   Assessment & Plan Coronary artery disease involving native coronary artery of native heart with angina pectoris (HCC) No anginal symptoms on low dose isosorbide and metoprolol succinate. Continues on ASA.  Essential hypertension Blood pressure controlled in the setting of his advanced age. Continue losartan, metoprolol succinate.  Hyperlipidemia LDL goal <70 Treated with simvastatin. LDL 24. Triglycerides elevated in setting of poorly controlled diabetes. Counseled on glycemic control, dietary changes.       Medication Adjustments/Labs and Tests Ordered: Current medicines are reviewed at length with the patient today.  Concerns regarding medicines are outlined above.  No orders of the defined types were placed in this encounter.  No orders of the defined types were placed in this encounter.   Patient Instructions  Follow-Up: At [facility], you and your health needs are our priority.  As part of our continuing mission to provide you with exceptional heart care, we have created designated Provider Care Teams.  These Care Teams include your primary Cardiologist (physician) and Advanced Practice Providers (APPs -  Physician Assistants and Nurse Practitioners) who all work together to provide you with the care you need, when you need it.  Your next appointment:   1 year(s)  Provider:   [name], MD      1st Floor: - Lobby - Registration  - Pharmacy  - Lab - Cafe  2nd Floor: - PV Lab - Diagnostic Testing (echo, CT, nuclear  med)  3rd Floor: - Vacant  4th Floor: - TCTS (cardiothoracic surgery) - AFib Clinic - Structural Heart Clinic - Vascular Surgery  - Vascular Ultrasound  5th Floor: - HeartCare Cardiology (general and EP) - Clinical Pharmacy for coumadin, hypertension, lipid, weight-loss medications, and med management appointments    Valet parking services will be available as well.     Signed, [name], MD  01/22/2024 12:40 PM    [facility]

## 2024-01-22 NOTE — Patient Instructions (Signed)
 Follow-Up: At [facility], you and your health needs are our priority.  As part of our continuing mission to provide you with exceptional heart care, we have created designated Provider Care Teams.  These Care Teams include your primary Cardiologist (physician) and Advanced Practice Providers (APPs -  Physician Assistants and Nurse Practitioners) who all work together to provide you with the care you need, when you need it.  Your next appointment:   1 year(s)  Provider:   [name], MD        1st Floor: - Lobby - Registration  - Pharmacy  - Lab - Cafe  2nd Floor: - PV Lab - Diagnostic Testing (echo, CT, nuclear med)  3rd Floor: - Vacant  4th Floor: - TCTS (cardiothoracic surgery) - AFib Clinic - Structural Heart Clinic - Vascular Surgery  - Vascular Ultrasound  5th Floor: - HeartCare Cardiology (general and EP) - Clinical Pharmacy for coumadin, hypertension, lipid, weight-loss medications, and med management appointments    Valet parking services will be available as well.

## 2024-01-22 NOTE — Assessment & Plan Note (Signed)
 Blood pressure controlled in the setting of his advanced age. Continue losartan, metoprolol succinate.

## 2024-01-22 NOTE — Assessment & Plan Note (Signed)
 No anginal symptoms on low dose isosorbide and metoprolol succinate. Continues on ASA.

## 2024-01-22 NOTE — Assessment & Plan Note (Signed)
 Treated with simvastatin. LDL 24. Triglycerides elevated in setting of poorly controlled diabetes. Counseled on glycemic control, dietary changes.

## 2024-03-08 ENCOUNTER — Ambulatory Visit: Payer: Medicare HMO | Admitting: Internal Medicine

## 2024-03-08 ENCOUNTER — Ambulatory Visit (INDEPENDENT_AMBULATORY_CARE_PROVIDER_SITE_OTHER): Payer: Medicare HMO | Admitting: Internal Medicine

## 2024-03-08 ENCOUNTER — Encounter: Payer: Self-pay | Admitting: Internal Medicine

## 2024-03-08 ENCOUNTER — Ambulatory Visit (INDEPENDENT_AMBULATORY_CARE_PROVIDER_SITE_OTHER)

## 2024-03-08 VITALS — BP 120/62 | HR 53 | Temp 98.6°F | Ht 70.0 in | Wt 145.0 lb

## 2024-03-08 DIAGNOSIS — E1159 Type 2 diabetes mellitus with other circulatory complications: Secondary | ICD-10-CM

## 2024-03-08 DIAGNOSIS — Z7984 Long term (current) use of oral hypoglycemic drugs: Secondary | ICD-10-CM | POA: Diagnosis not present

## 2024-03-08 DIAGNOSIS — R42 Dizziness and giddiness: Secondary | ICD-10-CM

## 2024-03-08 DIAGNOSIS — I10 Essential (primary) hypertension: Secondary | ICD-10-CM

## 2024-03-08 DIAGNOSIS — M79672 Pain in left foot: Secondary | ICD-10-CM | POA: Insufficient documentation

## 2024-03-08 DIAGNOSIS — E785 Hyperlipidemia, unspecified: Secondary | ICD-10-CM | POA: Diagnosis not present

## 2024-03-08 DIAGNOSIS — M7662 Achilles tendinitis, left leg: Secondary | ICD-10-CM | POA: Diagnosis not present

## 2024-03-08 DIAGNOSIS — M7732 Calcaneal spur, left foot: Secondary | ICD-10-CM | POA: Diagnosis not present

## 2024-03-08 MED ORDER — MECLIZINE HCL 12.5 MG PO TABS: 12.5000 mg | ORAL_TABLET | Freq: Three times a day (TID) | ORAL | 1 refills | Status: AC | PRN

## 2024-03-08 MED ORDER — LINAGLIPTIN 5 MG PO TABS: 5.0000 mg | ORAL_TABLET | Freq: Every day | ORAL | Status: AC

## 2024-03-08 NOTE — Assessment & Plan Note (Signed)
 BP Readings from Last 3 Encounters:  03/08/24 120/62  01/22/24 (!) 142/64  12/22/23 132/70   improved, pt to continue medical treatment losartan  100 mg every day, toprol  xl 12.5 qd

## 2024-03-08 NOTE — Assessment & Plan Note (Signed)
 Mild to mod worse at night, can't sleep.  Likely plantar fascitiis vs bone spur.  For xray, refer sport medicine, as might benefit from cortisone

## 2024-03-08 NOTE — Assessment & Plan Note (Signed)
 Lab Results  Component Value Date   HGBA1C 9.7 (H) 12/22/2023   Uncontrolled,, pt to continue current medical treatment jardiance  25 every day, glucotrol  xl 10 every day, tradjenta  5 mg every day, and metformin  ER 500 mg - 2 qam as too soon for repeat A1c, and doing well; to also f/u endo next month as planned

## 2024-03-08 NOTE — Assessment & Plan Note (Signed)
 Lab Results  Component Value Date   LDLCALC 24 12/22/2023   Stable, pt to continue current statin zocor  40mg  hs

## 2024-03-08 NOTE — Patient Instructions (Addendum)
 Please take all new medication as prescribed - the meclizine  as needed for dizziness  Please continue all other medications as before, and refills have been done if requested.  Please have the pharmacy call with any other refills you may need.  Please keep your appointments with your specialists as you may have planned- endocrinology next month  You will be contacted regarding the referral for: Sports Medicine  Please go to the XRAY Department in the first floor for the x-ray testing  You will be contacted by phone if any changes need to be made immediately.  Otherwise, you will receive a letter about your results with an explanation, but please check with MyChart first.  Please make an Appointment to return in 6 months, or sooner if needed

## 2024-03-08 NOTE — Assessment & Plan Note (Signed)
 With mild worsening last wk , exam benign, for mecliine prn restart

## 2024-03-08 NOTE — Progress Notes (Signed)
 Patient ID: Keith Herrera, male   DOB: 1932-10-14, 89 y.o.   MRN: [id number]        Chief Complaint: follow up left heel pain, vertigo, DM, htn, hld       HPI:  Keith Herrera is a 89 y.o. male here overall doing ok, did see endo with start tradjenta  5 mg daily, tolerating well.  Pt denies chest pain, increased sob or doe, wheezing, orthopnea, PND, increased LE swelling, palpitations, or syncope,but has had several mild episodes vertigo in the past wk with head movement.  Still driving, about to be 89 yo next month..   Pt denies polydipsia, polyuria, or new focal neuro s/s.   Does have 1 mo onset left heel burning pain mostly at night and can't sleep well       Wt Readings from Last 3 Encounters:  03/08/24 145 lb (65.8 kg)  01/22/24 140 lb 9.6 oz (63.8 kg)  12/22/23 149 lb (67.6 kg)   BP Readings from Last 3 Encounters:  03/08/24 120/62  01/22/24 (!) 142/64  12/22/23 132/70         Past Medical History:  Diagnosis Date   Adenomatous polyp of colon 05/2004   Allergic rhinitis    Anxiety    Aortic insufficiency 03/01/2022   Echocardiogram 02/2022: EF 60-65, no RWMA, Gr 2 DD, normal RVSF, mild to mod LAE, mild MR, mild AI, AV sclerosis w/o AS   BPH (benign prostatic hyperplasia)    CAD (coronary artery disease)    s/p Cypher DES to LAD and pD1 2010;  LHC was done 6/12: EF 65%, circumflex patent, mid RCA 80-90% (small and nondominant), LAD and diagonal stents patent, LAD at the origin of the first diagonal 30%, ostial D2 90%, mid 80-90% (small vessel).  Continued medical therapy was recommended.    Carotid stenosis    dopplers 02/2012: 0-39% RICA; 40-59% LICA   Coronary artery disease involving native coronary artery of native heart with angina pectoris (HCC) 11/02/2008   Myoview  02/2022: EF 76, no ischemia or infarction; low risk     Diabetes type 2, controlled (HCC)    Diverticulosis    colon   ED (erectile dysfunction)    Esophageal stricture    GERD (gastroesophageal reflux  disease)    Hemorrhoids    Hiatal hernia    Hyperlipidemia    Hypertension    IBS (irritable bowel syndrome)    PVC's (premature ventricular contractions) 01/21/2020   Monitor 07/2019: PVC burden 21.6% // Echocardiogram 01/2020: EF 65-70, no RWMA, Gr 2 DD, normal RVSF, RVSP 43.3 mmHg (mod pul HTN), mild LAE, mild MR, trivial AI   Right inguinal hernia    s/p repair in 8/12   SVT (supraventricular tachycardia) (HCC)    Past Surgical History:  Procedure Laterality Date   CARDIAC CATHETERIZATION N/A 12/10/2016   Procedure: Left Heart Cath and Coronary Angiography;  Surgeon: Lucendia Rusk, MD;  Location: [facility] INVASIVE CV LAB;  Service: Cardiovascular;  Laterality: N/A;   CARDIAC CATHETERIZATION N/A 12/10/2016   Procedure: Coronary Balloon Angioplasty;  Surgeon: Lucendia Rusk, MD;  Location: [facility] INVASIVE CV LAB;  Service: Cardiovascular;  Laterality: N/A;   CARDIAC CATHETERIZATION N/A 12/10/2016   Procedure: Intravascular Pressure Wire/FFR Study;  Surgeon: Lucendia Rusk, MD;  Location: [facility] INVASIVE CV LAB;  Service: Cardiovascular;  Laterality: N/A;   cataract surgery  2020   CORONARY STENT PLACEMENT  10/18/2008   x 2   ELECTROPHYSIOLOGIC STUDY N/A 03/20/2015   no  inducible SVT - Dr Ronal Coca HERNIA REPAIR Right    RIH with ultrapro patch    reports that he quit smoking about 68 years ago. His smoking use included cigarettes. He has never used smokeless tobacco. He reports that he does not drink alcohol and does not use drugs. family history includes Bladder Cancer (age of onset: 78) in his mother; Healthy in his daughter and son; Heart attack (age of onset: 89) in his father; Pancreatic cancer (age of onset: 74) in his sister. Allergies  Allergen Reactions   Crestor [Rosuvastatin Calcium ] Rash    All over body   Lovastatin Itching and Rash   Current Outpatient Medications on File Prior to Visit  Medication Sig Dispense Refill   ACCU-CHEK AVIVA PLUS test strip USE  FOR TESTING UP TO 4 TIMES DAILY AS DIRECTED 100 strip 2   aspirin  EC 81 MG tablet Take 81 mg by mouth daily after supper.     blood glucose meter kit and supplies Dispense based on patient and insurance preference. Use up to four times daily as directed. (FOR ICD-10 E10.9, E11.9). 1 each 0   Blood Glucose Monitoring Suppl (ONE TOUCH ULTRA 2) w/Device KIT Use as directed E 11.9 1 each 0   brimonidine  (ALPHAGAN ) 0.2 % ophthalmic solution Administer 1 drop to both eyes Three (3) times a day.     brimonidine -timolol (COMBIGAN) 0.2-0.5 % ophthalmic solution Place into both eyes every 12 (twelve) hours.     cefdinir  (OMNICEF ) 300 MG capsule Take 1 capsule (300 mg total) by mouth 2 (two) times daily. 20 capsule 0   dorzolamide-timolol (COSOPT) 2-0.5 % ophthalmic solution 1 drop 2 (two) times daily.     empagliflozin  (JARDIANCE ) 25 MG TABS tablet Take 1 tablet (25 mg total) by mouth daily before breakfast. 90 tablet 3   finasteride  (PROSCAR ) 5 MG tablet TAKE 1 TABLET (5 MG TOTAL) BY MOUTH DAILY. 90 tablet 3   gabapentin  (NEURONTIN ) 300 MG capsule Take 2 capsules (600 mg total) by mouth at bedtime. 180 capsule 1   glipiZIDE  (GLUCOTROL  XL) 10 MG 24 hr tablet Take 1 tablet (10 mg total) by mouth daily with breakfast. 90 tablet 3   hydrOXYzine  (VISTARIL ) 25 MG capsule TAKE 1-2 CAPSULES BY MOUTH AT BEDTIME AS NEEDED FOR ITCHING AND SLEEP 180 capsule 1   isosorbide  mononitrate (IMDUR ) 30 MG 24 hr tablet TAKE 1/2 OF A TABLET (15 MG TOTAL) BY MOUTH DAILY 45 tablet 2   Lancets MISC Use as directed twice daily E11.9 200 each 3   latanoprost  (XALATAN ) 0.005 % ophthalmic solution Place 1 drop into both eyes at bedtime.     losartan  (COZAAR ) 100 MG tablet TAKE 1 TABLET BY MOUTH EVERY DAY 90 tablet 3   metFORMIN  (GLUCOPHAGE -XR) 500 MG 24 hr tablet TAKE 2 TABLETS BY MOUTH EVERY DAY WITH BREAKFAST 180 tablet 3   metoprolol  succinate (TOPROL -XL) 25 MG 24 hr tablet TAKE 1/2 TABLET BY MOUTH DAILY 45 tablet 2   nitroGLYCERIN   (NITROSTAT ) 0.4 MG SL tablet PLACE 1 TABLET UNDER THE TONGUE EVERY 5 MINUTES AS NEEDED FOR CHEST PAIN 25 tablet 2   omeprazole  (PRILOSEC) 40 MG capsule TAKE 1 CAPSULE (40 MG TOTAL) BY MOUTH DAILY. 90 capsule 3   predniSONE  (DELTASONE ) 10 MG tablet 2 tabs by mouth per day for 5 days 10 tablet 0   promethazine -dextromethorphan (PROMETHAZINE -DM) 6.25-15 MG/5ML syrup Take 5 mLs by mouth 4 (four) times daily as needed. 118 mL 1   simvastatin  (  ZOCOR ) 40 MG tablet TAKE 1 TABLET BY MOUTH EVERYDAY AT BEDTIME 90 tablet 3   triamcinolone  cream (KENALOG ) 0.1 % Apply 1 Application topically 2 (two) times daily. 453.6 g 1   No current facility-administered medications on file prior to visit.        ROS:  All others reviewed and negative.  Objective        PE:  BP 120/62 (BP Location: Left Arm, Patient Position: Sitting, Cuff Size: Normal)   Pulse (!) 53   Temp 98.6 F (37 C) (Oral)   Ht 5\' 10"  (1.778 m)   Wt 145 lb (65.8 kg)   SpO2 98%   BMI 20.81 kg/m                 Constitutional: Pt appears in NAD               HENT: Head: NCAT.                Right Ear: External ear normal.                 Left Ear: External ear normal.                Eyes: . Pupils are equal, round, and reactive to light. Conjunctivae and EOM are normal               Nose: without d/c or deformity               Neck: Neck supple. Gross normal ROM               Cardiovascular: Normal rate and regular rhythm.                 Pulmonary/Chest: Effort normal and breath sounds without rales or wheezing.                Abd:  Soft, NT, ND, + BS, no organomegaly               Neurological: Pt is alert. At baseline orientation, motor grossly intact               Skin: Skin is warm. No rashes, no other new lesions, LE edema - none, left heel mild tender               Psychiatric: Pt behavior is normal without agitation   Micro: none  Cardiac tracings I have personally interpreted today:  none  Pertinent Radiological findings  (summarize): none   Lab Results  Component Value Date   WBC 8.4 12/22/2023   HGB 14.1 12/22/2023   HCT 41.6 12/22/2023   PLT 255.0 12/22/2023   GLUCOSE 184 (H) 12/22/2023   CHOL 101 12/22/2023   TRIG 235.0 (H) 12/22/2023   HDL 29.50 (L) 12/22/2023   LDLDIRECT 65.0 03/07/2023   LDLCALC 24 12/22/2023   ALT 20 12/22/2023   AST 16 12/22/2023   NA 141 12/22/2023   K 4.2 12/22/2023   CL 105 12/22/2023   CREATININE 0.95 12/22/2023   BUN 22 12/22/2023   CO2 28 12/22/2023   TSH 3.18 12/22/2023   PSA 2.49 12/21/2012   INR 1.15 12/10/2016   HGBA1C 9.7 (H) 12/22/2023   MICROALBUR 13.8 (H) 12/22/2023   Assessment/Plan:  ARRINGTON YOHE is a 89 y.o. White or Caucasian [1] male with  has a past medical history of Adenomatous polyp of colon (05/2004), Allergic rhinitis, Anxiety, Aortic insufficiency (03/01/2022), BPH (benign prostatic hyperplasia),  CAD (coronary artery disease), Carotid stenosis, Coronary artery disease involving native coronary artery of native heart with angina pectoris (HCC) (11/02/2008), Diabetes type 2, controlled (HCC), Diverticulosis, ED (erectile dysfunction), Esophageal stricture, GERD (gastroesophageal reflux disease), Hemorrhoids, Hiatal hernia, Hyperlipidemia, Hypertension, IBS (irritable bowel syndrome), PVC's (premature ventricular contractions) (01/21/2020), Right inguinal hernia, and SVT (supraventricular tachycardia) (HCC).  Pain of left heel Mild to mod worse at night, can't sleep.  Likely plantar fascitiis vs bone spur.  For xray, refer sport medicine, as might benefit from cortisone  Type 2 diabetes mellitus with vascular disease (HCC) Lab Results  Component Value Date   HGBA1C 9.7 (H) 12/22/2023   Uncontrolled,, pt to continue current medical treatment jardiance  25 every day, glucotrol  xl 10 every day, tradjenta  5 mg every day, and metformin  ER 500 mg - 2 qam as too soon for repeat A1c, and doing well; to also f/u endo next month as  planned   Hyperlipidemia LDL goal <70 Lab Results  Component Value Date   LDLCALC 24 12/22/2023   Stable, pt to continue current statin zocor  40mg  hs   Essential hypertension BP Readings from Last 3 Encounters:  03/08/24 120/62  01/22/24 (!) 142/64  12/22/23 132/70   improved, pt to continue medical treatment losartan  100 mg every day, toprol  xl 12.5 qd   Dizziness With mild worsening last wk , exam benign, for mecliine prn restart  Followup: Return in about 6 months (around 09/07/2024).  Keith Colonel, MD 03/08/2024 10:35 AM [facility]

## 2024-03-11 NOTE — Progress Notes (Unsigned)
 Ben Jackson D.[facility] [address] [place] [address] Phone: [phone]   Assessment and Plan:     There are no diagnoses linked to this encounter.  ***   Pertinent previous records reviewed include ***    Follow Up: ***     Subjective:   I, Keith Herrera, am serving as a Neurosurgeon for Doctor Ulysees Gander  Chief Complaint: pain in left heel   HPI:   03/12/2024 Patient is a 89 year old male with left heel pain. Patient states   Relevant Historical Information: ***  Additional pertinent review of systems negative.   Current Outpatient Medications:    ACCU-CHEK AVIVA PLUS test strip, USE FOR TESTING UP TO 4 TIMES DAILY AS DIRECTED, Disp: 100 strip, Rfl: 2   aspirin  EC 81 MG tablet, Take 81 mg by mouth daily after supper., Disp: , Rfl:    blood glucose meter kit and supplies, Dispense based on patient and insurance preference. Use up to four times daily as directed. (FOR ICD-10 E10.9, E11.9)., Disp: 1 each, Rfl: 0   Blood Glucose Monitoring Suppl (ONE TOUCH ULTRA 2) w/Device KIT, Use as directed E 11.9, Disp: 1 each, Rfl: 0   brimonidine  (ALPHAGAN ) 0.2 % ophthalmic solution, Administer 1 drop to both eyes Three (3) times a day., Disp: , Rfl:    brimonidine -timolol (COMBIGAN) 0.2-0.5 % ophthalmic solution, Place into both eyes every 12 (twelve) hours., Disp: , Rfl:    cefdinir  (OMNICEF ) 300 MG capsule, Take 1 capsule (300 mg total) by mouth 2 (two) times daily., Disp: 20 capsule, Rfl: 0   dorzolamide-timolol (COSOPT) 2-0.5 % ophthalmic solution, 1 drop 2 (two) times daily., Disp: , Rfl:    empagliflozin  (JARDIANCE ) 25 MG TABS tablet, Take 1 tablet (25 mg total) by mouth daily before breakfast., Disp: 90 tablet, Rfl: 3   finasteride  (PROSCAR ) 5 MG tablet, TAKE 1 TABLET (5 MG TOTAL) BY MOUTH DAILY., Disp: 90 tablet, Rfl: 3   gabapentin  (NEURONTIN ) 300 MG capsule, Take 2 capsules (600 mg total) by mouth at bedtime., Disp: 180  capsule, Rfl: 1   glipiZIDE  (GLUCOTROL  XL) 10 MG 24 hr tablet, Take 1 tablet (10 mg total) by mouth daily with breakfast., Disp: 90 tablet, Rfl: 3   hydrOXYzine  (VISTARIL ) 25 MG capsule, TAKE 1-2 CAPSULES BY MOUTH AT BEDTIME AS NEEDED FOR ITCHING AND SLEEP, Disp: 180 capsule, Rfl: 1   isosorbide  mononitrate (IMDUR ) 30 MG 24 hr tablet, TAKE 1/2 OF A TABLET (15 MG TOTAL) BY MOUTH DAILY, Disp: 45 tablet, Rfl: 2   Lancets MISC, Use as directed twice daily E11.9, Disp: 200 each, Rfl: 3   latanoprost  (XALATAN ) 0.005 % ophthalmic solution, Place 1 drop into both eyes at bedtime., Disp: , Rfl:    linagliptin  (TRADJENTA ) 5 MG TABS tablet, Take 1 tablet (5 mg total) by mouth daily., Disp: , Rfl:    losartan  (COZAAR ) 100 MG tablet, TAKE 1 TABLET BY MOUTH EVERY DAY, Disp: 90 tablet, Rfl: 3   meclizine  (ANTIVERT ) 12.5 MG tablet, Take 1 tablet (12.5 mg total) by mouth 3 (three) times daily as needed., Disp: 40 tablet, Rfl: 1   metFORMIN  (GLUCOPHAGE -XR) 500 MG 24 hr tablet, TAKE 2 TABLETS BY MOUTH EVERY DAY WITH BREAKFAST, Disp: 180 tablet, Rfl: 3   metoprolol  succinate (TOPROL -XL) 25 MG 24 hr tablet, TAKE 1/2 TABLET BY MOUTH DAILY, Disp: 45 tablet, Rfl: 2   nitroGLYCERIN  (NITROSTAT ) 0.4 MG SL tablet, PLACE 1 TABLET UNDER THE TONGUE EVERY  5 MINUTES AS NEEDED FOR CHEST PAIN, Disp: 25 tablet, Rfl: 2   omeprazole  (PRILOSEC) 40 MG capsule, TAKE 1 CAPSULE (40 MG TOTAL) BY MOUTH DAILY., Disp: 90 capsule, Rfl: 3   predniSONE  (DELTASONE ) 10 MG tablet, 2 tabs by mouth per day for 5 days, Disp: 10 tablet, Rfl: 0   promethazine -dextromethorphan (PROMETHAZINE -DM) 6.25-15 MG/5ML syrup, Take 5 mLs by mouth 4 (four) times daily as needed., Disp: 118 mL, Rfl: 1   simvastatin  (ZOCOR ) 40 MG tablet, TAKE 1 TABLET BY MOUTH EVERYDAY AT BEDTIME, Disp: 90 tablet, Rfl: 3   triamcinolone  cream (KENALOG ) 0.1 %, Apply 1 Application topically 2 (two) times daily., Disp: 453.6 g, Rfl: 1   Objective:     There were no vitals filed for this  visit.    There is no height or weight on file to calculate BMI.    Physical Exam:    ***   Electronically signed by:  Marshall Skeeter D.[facility] 7:44 AM 03/11/24

## 2024-03-12 ENCOUNTER — Ambulatory Visit: Admitting: Sports Medicine

## 2024-03-12 VITALS — BP 120/62 | HR 76 | Ht 70.0 in | Wt 145.0 lb

## 2024-03-12 DIAGNOSIS — I739 Peripheral vascular disease, unspecified: Secondary | ICD-10-CM

## 2024-03-12 DIAGNOSIS — B351 Tinea unguium: Secondary | ICD-10-CM

## 2024-03-12 DIAGNOSIS — M79672 Pain in left foot: Secondary | ICD-10-CM | POA: Diagnosis not present

## 2024-03-12 NOTE — Patient Instructions (Addendum)
 Thank you for coming in today  I recommend that you contact your cardiologist and let them know that you are having a new burning sensation in your left heel and white discoloration in left foot.  Tylenol  for day to day pain relief May try Voltaren  gel over areas of pain   For toenail fungus, you may use over-the-counter toenail fungus creams.  You can try medication called terbinafine  Follow-up as needed

## 2024-03-16 ENCOUNTER — Telehealth: Payer: Self-pay

## 2024-03-16 DIAGNOSIS — R0989 Other specified symptoms and signs involving the circulatory and respiratory systems: Secondary | ICD-10-CM

## 2024-03-16 DIAGNOSIS — M79672 Pain in left foot: Secondary | ICD-10-CM

## 2024-03-16 NOTE — Telephone Encounter (Signed)
 Order placed for ABI's w/TBI at this time. Called and left message for patient to let him know and asked that he call back if wanting to discuss further.

## 2024-03-16 NOTE — Telephone Encounter (Signed)
-----   Message from Ulysees Gander sent at 03/12/2024  1:34 PM EDT ----- Regarding: RE: New left heel pain I think repeat ABI and TBI's are a great place to start.  Nothing additional at this time.  Thank you.  Ben ----- Message ----- From: Arnoldo Lapping, MD Sent: 03/12/2024   1:28 PM EDT To: Ulysees Gander, DO; Keller Patella, RN Subject: RE: New left heel pain                         Sure. Do you want me to just repeat ABI's and TBI's? I'll add my nurse, Isa Manuel, and we will put orders in for that. Let me know if you think he needs additional testing.  Athena Bland ----- Message ----- From: Ulysees Gander, DO Sent: 03/12/2024   1:23 PM EDT To: Arnoldo Lapping, MD Subject: New left heel pain                             Hello Dr. Arlester Ladd.  I just saw Keith Herrera for the first time in clinic for new onset left heel pain for 3 to 4 weeks.  I am concerned that his pain may be vascular in nature with atherosclerosis visible on x-ray imaging, increased blanching of left foot compared to right foot, no specific MOI, and physical exam not consistent with plantar fasciitis or Achilles tendinitis.  I saw he had an unremarkable ABI in June 2024, however with new symptoms I recommended that he reach back out to your office to discuss possible next steps.  Pedal pulses were weakly present bilaterally to me, so I do not feel that this is an urgent or emergent need for reevaluation.  I just wanted touch base.  Thank you.

## 2024-03-18 ENCOUNTER — Encounter: Payer: Self-pay | Admitting: Internal Medicine

## 2024-04-13 DIAGNOSIS — E1165 Type 2 diabetes mellitus with hyperglycemia: Secondary | ICD-10-CM | POA: Diagnosis not present

## 2024-04-15 ENCOUNTER — Ambulatory Visit: Payer: Self-pay | Admitting: Cardiovascular Disease

## 2024-04-15 ENCOUNTER — Ambulatory Visit (HOSPITAL_COMMUNITY)
Admission: RE | Admit: 2024-04-15 | Discharge: 2024-04-15 | Disposition: A | Discharge status: Home / Self Care | Source: Ambulatory Visit | Attending: Cardiovascular Disease | Admitting: Cardiovascular Disease

## 2024-04-15 DIAGNOSIS — R0989 Other specified symptoms and signs involving the circulatory and respiratory systems: Secondary | ICD-10-CM

## 2024-04-15 DIAGNOSIS — M79672 Pain in left foot: Secondary | ICD-10-CM | POA: Diagnosis not present

## 2024-04-15 LAB — VAS US ABI WITH/WO TBI: Left ABI: 1.18

## 2024-04-19 DIAGNOSIS — Z0100 Encounter for examination of eyes and vision without abnormal findings: Secondary | ICD-10-CM | POA: Diagnosis not present

## 2024-04-19 DIAGNOSIS — E119 Type 2 diabetes mellitus without complications: Secondary | ICD-10-CM | POA: Diagnosis not present

## 2024-04-19 DIAGNOSIS — H524 Presbyopia: Secondary | ICD-10-CM | POA: Diagnosis not present

## 2024-04-20 DIAGNOSIS — E785 Hyperlipidemia, unspecified: Secondary | ICD-10-CM | POA: Diagnosis not present

## 2024-04-20 DIAGNOSIS — I10 Essential (primary) hypertension: Secondary | ICD-10-CM | POA: Diagnosis not present

## 2024-04-20 DIAGNOSIS — E1165 Type 2 diabetes mellitus with hyperglycemia: Secondary | ICD-10-CM | POA: Diagnosis not present

## 2024-05-07 ENCOUNTER — Other Ambulatory Visit: Payer: Self-pay | Admitting: Internal Medicine

## 2024-05-07 ENCOUNTER — Other Ambulatory Visit: Payer: Self-pay

## 2024-05-07 ENCOUNTER — Other Ambulatory Visit: Payer: Self-pay | Admitting: Cardiovascular Disease

## 2024-05-14 ENCOUNTER — Other Ambulatory Visit: Payer: Self-pay | Admitting: Internal Medicine

## 2024-05-25 DIAGNOSIS — H01003 Unspecified blepharitis right eye, unspecified eyelid: Secondary | ICD-10-CM | POA: Diagnosis not present

## 2024-05-25 DIAGNOSIS — H5319 Other subjective visual disturbances: Secondary | ICD-10-CM | POA: Diagnosis not present

## 2024-05-25 DIAGNOSIS — H01006 Unspecified blepharitis left eye, unspecified eyelid: Secondary | ICD-10-CM | POA: Diagnosis not present

## 2024-05-25 DIAGNOSIS — H21233 Degeneration of iris (pigmentary), bilateral: Secondary | ICD-10-CM | POA: Diagnosis not present

## 2024-05-25 DIAGNOSIS — Z961 Presence of intraocular lens: Secondary | ICD-10-CM | POA: Diagnosis not present

## 2024-06-08 ENCOUNTER — Other Ambulatory Visit: Payer: Self-pay | Admitting: Cardiovascular Disease

## 2024-06-22 ENCOUNTER — Ambulatory Visit (INDEPENDENT_AMBULATORY_CARE_PROVIDER_SITE_OTHER): Payer: Medicare HMO

## 2024-06-22 VITALS — Ht 70.0 in | Wt 145.0 lb

## 2024-06-22 DIAGNOSIS — Z0000 Encounter for general adult medical examination without abnormal findings: Secondary | ICD-10-CM

## 2024-06-22 NOTE — Progress Notes (Signed)
 Subjective:   Keith Herrera is a 89 y.o. who presents for a Medicare Wellness preventive visit.  As a reminder, Annual Wellness Visits don't include a physical exam, and some assessments may be limited, especially if this visit is performed virtually. We may recommend an in-person follow-up visit with your provider if needed.  Visit Complete: Virtual I connected with  Keith Herrera on 06/22/24 by a audio enabled telemedicine application and verified that I am speaking with the correct person using two identifiers.  Patient Location: Home  Provider Location: Home Office  I discussed the limitations of evaluation and management by telemedicine. The patient expressed understanding and agreed to proceed.  Vital Signs: Because this visit was a virtual/telehealth visit, some criteria may be missing or patient reported. Any vitals not documented were not able to be obtained and vitals that have been documented are patient reported.  VideoDeclined- This patient declined Librarian, academic. Therefore the visit was completed with audio only.  Persons Participating in Visit: Patient.  AWV Questionnaire: No: Patient Medicare AWV questionnaire was not completed prior to this visit.  Cardiac Risk Factors include: advanced age (>5men, >69 women);hypertension;male gender;diabetes mellitus;dyslipidemia;Other (see comment), Risk factor comments: BPH     Objective:    Today's Vitals   06/22/24 1356  Weight: 145 lb (65.8 kg)  Height: 5' 10 (1.778 m)   Body mass index is 20.81 kg/m.     06/22/2024    2:28 PM 06/16/2023    3:00 PM 05/17/2022    2:09 PM 03/08/2021    1:47 PM 12/08/2016   10:35 PM 12/08/2016    5:08 PM 12/27/2015   10:25 AM  Advanced Directives  Does Patient Have a Medical Advance Directive? Yes Yes Yes Yes No  No  Yes   Type of Estate agent of [place];Living will Healthcare Power of [place];Living will Living  will;Healthcare Power of [employer] Power of [place];Living will     Does patient want to make changes to medical advance directive?   No - Patient declined No - Patient declined     Copy of Healthcare Power of Attorney in Chart? No - copy requested No - copy requested No - copy requested No - copy requested     Would patient like information on creating a medical advance directive?     No - Patient declined  No - Patient declined       Data saved with a previous flowsheet row definition    Current Medications (verified) Outpatient Encounter Medications as of 06/22/2024  Medication Sig   ACCU-CHEK AVIVA PLUS test strip USE FOR TESTING UP TO 4 TIMES DAILY AS DIRECTED   aspirin  EC 81 MG tablet Take 81 mg by mouth daily after supper.   blood glucose meter kit and supplies Dispense based on patient and insurance preference. Use up to four times daily as directed. (FOR ICD-10 E10.9, E11.9).   Blood Glucose Monitoring Suppl (ONE TOUCH ULTRA 2) w/Device KIT Use as directed E 11.9   brimonidine  (ALPHAGAN ) 0.2 % ophthalmic solution Administer 1 drop to both eyes Three (3) times a day.   brimonidine -timolol (COMBIGAN) 0.2-0.5 % ophthalmic solution Place into both eyes every 12 (twelve) hours.   cefdinir  (OMNICEF ) 300 MG capsule Take 1 capsule (300 mg total) by mouth 2 (two) times daily.   dorzolamide-timolol (COSOPT) 2-0.5 % ophthalmic solution 1 drop 2 (two) times daily.   empagliflozin  (JARDIANCE ) 25 MG TABS tablet Take 1 tablet (25 mg  total) by mouth daily before breakfast.   finasteride  (PROSCAR ) 5 MG tablet TAKE ONE TABLET BY MOUTH EVERY DAY   gabapentin  (NEURONTIN ) 300 MG capsule TAKE TWO CAPSULES BY MOUTH AT BEDTIME   glipiZIDE  (GLUCOTROL  XL) 10 MG 24 hr tablet Take 1 tablet (10 mg total) by mouth daily with breakfast.   hydrOXYzine  (VISTARIL ) 25 MG capsule TAKE 1-2 CAPSULES BY MOUTH AT BEDTIME AS NEEDED FOR ITCHING AND SLEEP   isosorbide  mononitrate (IMDUR ) 30 MG 24 hr tablet TAKE  ONE-HALF TABLET BY MOUTH EVERY DAY   Lancets MISC Use as directed twice daily E11.9   latanoprost  (XALATAN ) 0.005 % ophthalmic solution PLACE ONE drop into BOTH eyes nightly   linagliptin  (TRADJENTA ) 5 MG TABS tablet Take 1 tablet (5 mg total) by mouth daily.   losartan  (COZAAR ) 100 MG tablet TAKE 1 TABLET BY MOUTH EVERY DAY   meclizine  (ANTIVERT ) 12.5 MG tablet Take 1 tablet (12.5 mg total) by mouth 3 (three) times daily as needed.   metFORMIN  (GLUCOPHAGE -XR) 500 MG 24 hr tablet TAKE 2 TABLETS BY MOUTH EVERY DAY WITH BREAKFAST   metoprolol  succinate (TOPROL -XL) 25 MG 24 hr tablet TAKE ONE-HALF TABLET BY MOUTH EVERY DAY   nitroGLYCERIN  (NITROSTAT ) 0.4 MG SL tablet PLACE 1 TABLET UNDER THE TONGUE EVERY 5 MINUTES AS NEEDED FOR CHEST PAIN   omeprazole  (PRILOSEC) 40 MG capsule TAKE ONE CAPSULE BY MOUTH EVERY DAY   predniSONE  (DELTASONE ) 10 MG tablet 2 tabs by mouth per day for 5 days   promethazine -dextromethorphan (PROMETHAZINE -DM) 6.25-15 MG/5ML syrup Take 5 mLs by mouth 4 (four) times daily as needed.   simvastatin  (ZOCOR ) 40 MG tablet TAKE ONE TABLET BY MOUTH AT BEDTIME   triamcinolone  cream (KENALOG ) 0.1 % Apply 1 Application topically 2 (two) times daily.   No facility-administered encounter medications on file as of 06/22/2024.    Allergies (verified) Crestor [rosuvastatin calcium ] and Lovastatin   History: Past Medical History:  Diagnosis Date   Adenomatous polyp of colon 05/2004   Allergic rhinitis    Anxiety    Aortic insufficiency 03/01/2022   Echocardiogram 02/2022: EF 60-65, no RWMA, Gr 2 DD, normal RVSF, mild to mod LAE, mild MR, mild AI, AV sclerosis w/o AS   BPH (benign prostatic hyperplasia)    CAD (coronary artery disease)    s/p Cypher DES to LAD and pD1 2010;  LHC was done 6/12: EF 65%, circumflex patent, mid RCA 80-90% (small and nondominant), LAD and diagonal stents patent, LAD at the origin of the first diagonal 30%, ostial D2 90%, mid 80-90% (small vessel).  Continued  medical therapy was recommended.    Carotid stenosis    dopplers 02/2012: 0-39% RICA; 40-59% LICA   Coronary artery disease involving native coronary artery of native heart with angina pectoris (HCC) 11/02/2008   Myoview  02/2022: EF 76, no ischemia or infarction; low risk     Diabetes type 2, controlled (HCC)    Diverticulosis    colon   ED (erectile dysfunction)    Esophageal stricture    GERD (gastroesophageal reflux disease)    Hemorrhoids    Hiatal hernia    Hyperlipidemia    Hypertension    IBS (irritable bowel syndrome)    PVC's (premature ventricular contractions) 01/21/2020   Monitor 07/2019: PVC burden 21.6% // Echocardiogram 01/2020: EF 65-70, no RWMA, Gr 2 DD, normal RVSF, RVSP 43.3 mmHg (mod pul HTN), mild LAE, mild MR, trivial AI   Right inguinal hernia    s/p repair in 8/12  SVT (supraventricular tachycardia) [facility])    Past Surgical History:  Procedure Laterality Date   CARDIAC CATHETERIZATION N/A 12/10/2016   Procedure: Left Heart Cath and Coronary Angiography;  Surgeon: Candyce GORMAN Reek, MD;  Location: [facility] INVASIVE CV LAB;  Service: Cardiovascular;  Laterality: N/A;   CARDIAC CATHETERIZATION N/A 12/10/2016   Procedure: Coronary Balloon Angioplasty;  Surgeon: Candyce GORMAN Reek, MD;  Location: [facility] INVASIVE CV LAB;  Service: Cardiovascular;  Laterality: N/A;   CARDIAC CATHETERIZATION N/A 12/10/2016   Procedure: Intravascular Pressure Wire/FFR Study;  Surgeon: Candyce GORMAN Reek, MD;  Location: [facility] INVASIVE CV LAB;  Service: Cardiovascular;  Laterality: N/A;   cataract surgery  2020   CORONARY STENT PLACEMENT  10/18/2008   x 2   ELECTROPHYSIOLOGIC STUDY N/A 03/20/2015   no inducible SVT - Dr Keith FONTANA HERNIA REPAIR Right    RIH with ultrapro patch   Family History  Problem Relation Age of Onset   Bladder Cancer Mother 63   Heart attack Father 45       Deceased   Pancreatic cancer Sister 73   Healthy Son    Healthy Daughter    Colon cancer Neg Hx     Social History   Socioeconomic History   Marital status: Married    Spouse name: Keith Herrera   Number of children: 3   Years of education: Not on file   Highest education level: Not on file  Occupational History   Occupation: Midwife for [employer]    Employer: RETIRED  Tobacco Use   Smoking status: Former    Current packs/day: 0.00    Types: Cigarettes    Quit date: 03/05/1956    Years since quitting: 68.3   Smokeless tobacco: Never   Tobacco comments:    smoked in teens  Vaping Use   Vaping status: Never Used  Substance and Sexual Activity   Alcohol use: No    Alcohol/week: 0.0 standard drinks of alcohol   Drug use: No   Sexual activity: Not on file  Other Topics Concern   Not on file  Social History Narrative   Lives with wife in a 3 story home.  Has 2 living children.  1 passed away in a car accident.     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     Social Drivers of Corporate investment banker Strain: Low Risk  (06/22/2024)   Overall Financial Resource Strain (CARDIA)    Difficulty of Paying Living Expenses: Not very hard  Food Insecurity: No Food Insecurity (06/22/2024)   Hunger Vital Sign    Worried About Running Out of Food in the Last Year: Never true    Ran Out of Food in the Last Year: Never true  Transportation Needs: No Transportation Needs (06/22/2024)   PRAPARE - Administrator, Civil Service (Medical): No    Lack of Transportation (Non-Medical): No  Physical Activity: Inactive (06/22/2024)   Exercise Vital Sign    Days of Exercise per Week: 0 days    Minutes of Exercise per Session: 0 min  Stress: No Stress Concern Present (06/22/2024)   [employer] of Occupational Health - Occupational Stress Questionnaire    Feeling of Stress: Not at all  Social Connections: Socially Integrated (06/22/2024)   Social Connection and Isolation Panel    Frequency of Communication with Friends and  Family: Three times a week    Frequency of  Social Gatherings with Friends and Family: Once a week    Attends Religious Services: More than 4 times per year    Active Member of [employer] or Organizations: Yes    Attends Banker Meetings: Never    Marital Status: Married    Tobacco Counseling Counseling given: Not Answered Tobacco comments: smoked in teens    Clinical Intake:  Pre-visit preparation completed: Yes  Pain : 0-10 Pain Type: Acute pain Pain Location: Foot (heel of lt foot) Pain Orientation: Left Pain Descriptors / Indicators: Sharp, Pins and needles, Aching (Lt leg also aches him at night time) Pain Onset: More than a month ago Pain Frequency: Intermittent Pain Relieving Factors: Excedrine 200 Effect of Pain on Daily Activities: has to get up and walk on it  Pain Relieving Factors: Excedrine 200  BMI - recorded: 20.81 Nutritional Status: BMI of 19-24  Normal Nutritional Risks: None Diabetes: Yes CBG done?: No Did pt. bring in CBG monitor from home?: No  Lab Results  Component Value Date   HGBA1C 9.7 (H) 12/22/2023   HGBA1C 10.0 (H) 09/08/2023   HGBA1C 8.4 (H) 03/07/2023     How often do you need to have someone help you when you read instructions, pamphlets, or other written materials from your doctor or pharmacy?: 1 - Never  Interpreter Needed?: No  Information entered by :: Keith Herrera, RMA   Activities of Daily Living     06/22/2024    1:56 PM  In your present state of health, do you have any difficulty performing the following activities:  Hearing? 1  Comment Wears hearing aides  Vision? 0  Difficulty concentrating or making decisions? 0  Walking or climbing stairs? 0  Dressing or bathing? 0  Doing errands, shopping? 0  Preparing Food and eating ? N  Using the Toilet? N  In the past six months, have you accidently leaked urine? N  Do you have problems with loss of bowel control? N  Managing your Medications? N  Managing your  Finances? N  Housekeeping or managing your Housekeeping? N    Patient Care Team: Keith Herrera ORN, MD as PCP - Keith Wonda Sharper, MD as PCP - Cardiology (Cardiology) Keith Herrera ORN, MD as PCP - Electrophysiology (Cardiology) Keith Herrera as Physician Assistant (Cardiology) Keith Lenis, MD as Referring Physician (Ophthalmology)  I have updated your Care Teams any recent Medical Services you may have received from other providers in the past year.     Assessment:   This is a routine wellness examination for Keith Herrera.  Hearing/Vision screen Hearing Screening - Comments:: Wears hearing aides Vision Screening - Comments:: Wears eyeglasses/ Dr. Carletta in [facility]   Goals Addressed   None    Depression Screen     06/22/2024    2:32 PM 03/08/2024    9:51 AM 09/08/2023    1:10 PM 06/16/2023    3:03 PM 03/07/2023    1:11 PM 08/22/2022    1:51 PM 05/17/2022    2:13 PM  PHQ 2/9 Scores  PHQ - 2 Score 0 0 0 0 0 2 0  PHQ- 9 Score 3   4 2 4      Fall Risk     06/22/2024    2:29 PM 03/08/2024    9:56 AM 09/08/2023    1:10 PM 06/16/2023    3:00 PM 03/07/2023    1:10 PM  Fall Risk   Falls in the past year? 0 0 0 0 0  Number falls in past yr: 0 0 0 0 0  Injury with Fall? 0 0 0 0 0  Risk for fall due to :  No Fall Risks No Fall Risks No Fall Risks No Fall Risks  Follow up Falls evaluation completed;Falls prevention discussed Falls evaluation completed Falls evaluation completed Falls prevention discussed Falls evaluation completed    MEDICARE RISK AT HOME:  Medicare Risk at Home Any stairs in or around the home?: Yes (3 story home/has ramps) If so, are there any without handrails?: No Home free of loose throw rugs in walkways, pet beds, electrical cords, etc?: Yes Adequate lighting in your home to reduce risk of falls?: Yes Life alert?: No Use of a cane, walker or w/c?: No Grab bars in the bathroom?: Yes Shower chair or bench in shower?: Yes Elevated toilet seat  or a handicapped toilet?: Yes  TIMED UP AND GO:  Was the test performed?  No  Cognitive Function: 6CIT completed        06/22/2024    2:30 PM 06/16/2023    4:37 PM 05/17/2022    2:26 PM  6CIT Screen  What Year? 0 points 0 points 0 points  What month? 0 points 0 points 0 points  What time? 0 points 0 points 0 points  Count back from 20 0 points 0 points 0 points  Months in reverse 0 points 0 points 0 points  Repeat phrase 0 points 0 points 0 points  Total Score 0 points 0 points 0 points    Immunizations Immunization History  Administered Date(s) Administered   Fluad Quad(high Dose 65+) 07/19/2019, 09/21/2020, 08/22/2022   Fluad Trivalent(High Dose 65+) 09/08/2023   H1N1 10/28/2008   Influenza Whole 11/30/2007, 11/18/2009   Influenza, High Dose Seasonal PF 09/21/2013, 10/04/2014, 08/19/2017   Influenza-Unspecified 06/21/2016, 08/19/2018   Moderna SARS-COV2 Booster Vaccination 04/19/2021   Moderna Sars-Covid-2 Vaccination 12/01/2019, 12/29/2019, 11/02/2020   PNEUMOCOCCAL CONJUGATE-20 12/22/2023   Pfizer(Comirnaty)Fall Seasonal Vaccine 12 years and older 09/12/2022   Pneumococcal Conjugate-13 10/05/2013   Pneumococcal Polysaccharide-23 11/30/2007   Td 03/23/1998, 10/28/2008   Tdap 11/25/2018   Zoster Recombinant(Shingrix) 06/20/2023, 09/02/2023    Screening Tests Health Maintenance  Topic Date Due   Medicare Annual Wellness (AWV)  06/15/2024   INFLUENZA VACCINE  06/18/2024   OPHTHALMOLOGY EXAM  07/28/2024   FOOT EXAM  03/08/2025   DTaP/Tdap/Td (4 - Td or Tdap) 11/25/2028   Pneumococcal Vaccine: 50+ Years  Completed   Zoster Vaccines- Shingrix  Completed   Hepatitis B Vaccines  Aged Out   HPV VACCINES  Aged Out   Meningococcal B Vaccine  Aged Out   COVID-19 Vaccine  Discontinued    Health Maintenance  Health Maintenance Due  Topic Date Due   Medicare Annual Wellness (AWV)  06/15/2024   INFLUENZA VACCINE  06/18/2024   Health Maintenance Items Addressed: See  Nurse Notes at the end of this note  Additional Screening:  Vision Screening: Recommended annual ophthalmology exams for early detection of glaucoma and other disorders of the eye. Would you like a referral to an eye doctor? No    Dental Screening: Recommended annual dental exams for proper oral hygiene  Community Resource Referral / Chronic Care Management: CRR required this visit?  No   CCM required this visit?  No   Plan:    I have personally reviewed and noted the following in the patient's chart:   Medical and social history Use of alcohol, tobacco or illicit drugs  Current medications and  supplements including opioid prescriptions. Patient is not currently taking opioid prescriptions. Functional ability and status Nutritional status Physical activity Advanced directives List of other physicians Hospitalizations, surgeries, and ER visits in previous 12 months Vitals Screenings to include cognitive, depression, and falls Referrals and appointments  In addition, I have reviewed and discussed with patient certain preventive protocols, quality metrics, and best practice recommendations. A written personalized care plan for preventive services as well as general preventive health recommendations were provided to patient.   Sahirah Rudell L Keith Herrera, CMA   06/22/2024   After Visit Summary: (MyChart) Due to this being a telephonic visit, the after visit summary with patients personalized plan was offered to patient via MyChart   Notes: Nothing significant to report at this time.

## 2024-06-22 NOTE — Patient Instructions (Signed)
 [name] , Thank you for taking time out of your busy schedule to complete your Annual Wellness Visit with me. I enjoyed our conversation and look forward to speaking with you again next year. I, as well as your care team,  appreciate your ongoing commitment to your health goals. Please review the following plan we discussed and let me know if I can assist you in the future. Your Game plan/ To Do List    Follow up Visits: We will see or speak with you next year for your Next Medicare AWV with our clinical staff Have you seen your provider in the last 6 months (3 months if uncontrolled diabetes)? Yes  Clinician Recommendations:  Aim for 30 minutes of exercise or brisk walking, 6-8 glasses of water, and 5 servings of fruits and vegetables each day. Keep up the good work.      This is a list of the screenings recommended for you:  Health Maintenance  Topic Date Due   Medicare Annual Wellness Visit  06/15/2024   Flu Shot  06/18/2024   Eye exam for diabetics  07/28/2024   Complete foot exam   03/08/2025   DTaP/Tdap/Td vaccine (4 - Td or Tdap) 11/25/2028   Pneumococcal Vaccine for age over 54  Completed   Zoster (Shingles) Vaccine  Completed   Hepatitis B Vaccine  Aged Out   HPV Vaccine  Aged Out   Meningitis B Vaccine  Aged Out   COVID-19 Vaccine  Discontinued    Advanced directives: (Copy Requested) Please bring a copy of your health care power of attorney and living will to the office to be added to your chart at your convenience. You can mail to [facility] [address] [place], [place] [address] or email to [email or web address] Advance Care Planning is important because it:  [x]  Makes sure you receive the medical care that is consistent with your values, goals, and preferences  [x]  It provides guidance to your family and loved ones and reduces their decisional burden about whether or not they are making the right decisions based on your wishes.  Follow  the link provided in your after visit summary or read over the paperwork we have mailed to you to help you started getting your Advance Directives in place. If you need assistance in completing these, please reach out to us  so that we can help you!  See attachments for Preventive Care and Fall Prevention Tips.

## 2024-07-20 ENCOUNTER — Other Ambulatory Visit: Payer: Self-pay | Admitting: Internal Medicine

## 2024-07-21 DIAGNOSIS — E1165 Type 2 diabetes mellitus with hyperglycemia: Secondary | ICD-10-CM | POA: Diagnosis not present

## 2024-07-28 DIAGNOSIS — E785 Hyperlipidemia, unspecified: Secondary | ICD-10-CM | POA: Diagnosis not present

## 2024-07-28 DIAGNOSIS — G629 Polyneuropathy, unspecified: Secondary | ICD-10-CM | POA: Diagnosis not present

## 2024-07-28 DIAGNOSIS — E1165 Type 2 diabetes mellitus with hyperglycemia: Secondary | ICD-10-CM | POA: Diagnosis not present

## 2024-07-28 DIAGNOSIS — I10 Essential (primary) hypertension: Secondary | ICD-10-CM | POA: Diagnosis not present

## 2024-08-04 ENCOUNTER — Other Ambulatory Visit: Payer: Self-pay | Admitting: Internal Medicine

## 2024-08-12 ENCOUNTER — Other Ambulatory Visit: Payer: Self-pay | Admitting: Internal Medicine

## 2024-09-03 ENCOUNTER — Other Ambulatory Visit: Payer: Self-pay | Admitting: Internal Medicine

## 2024-09-03 ENCOUNTER — Other Ambulatory Visit: Payer: Self-pay

## 2024-09-07 ENCOUNTER — Ambulatory Visit: Admitting: Internal Medicine

## 2024-09-07 ENCOUNTER — Encounter: Payer: Self-pay | Admitting: Internal Medicine

## 2024-09-07 VITALS — BP 126/72 | HR 56 | Temp 97.9°F | Ht 70.0 in | Wt 148.0 lb

## 2024-09-07 DIAGNOSIS — M7660 Achilles tendinitis, unspecified leg: Secondary | ICD-10-CM | POA: Insufficient documentation

## 2024-09-07 DIAGNOSIS — K5900 Constipation, unspecified: Secondary | ICD-10-CM

## 2024-09-07 DIAGNOSIS — Z23 Encounter for immunization: Secondary | ICD-10-CM

## 2024-09-07 DIAGNOSIS — M7662 Achilles tendinitis, left leg: Secondary | ICD-10-CM | POA: Diagnosis not present

## 2024-09-07 DIAGNOSIS — E1159 Type 2 diabetes mellitus with other circulatory complications: Secondary | ICD-10-CM | POA: Diagnosis not present

## 2024-09-07 DIAGNOSIS — E785 Hyperlipidemia, unspecified: Secondary | ICD-10-CM | POA: Diagnosis not present

## 2024-09-07 DIAGNOSIS — I10 Essential (primary) hypertension: Secondary | ICD-10-CM | POA: Diagnosis not present

## 2024-09-07 DIAGNOSIS — Z7984 Long term (current) use of oral hypoglycemic drugs: Secondary | ICD-10-CM

## 2024-09-07 MED ORDER — POLYETHYLENE GLYCOL 3350 17 GM/SCOOP PO POWD: 17.0000 g | Freq: Every day | ORAL | 1 refills | Status: AC | PRN

## 2024-09-07 NOTE — Assessment & Plan Note (Signed)
 BP Readings from Last 3 Encounters:  09/07/24 126/72  03/12/24 120/62  03/08/24 120/62   Stable, pt to continue medical treatment losartan  100 mg every day, toprol  xl 25 qd

## 2024-09-07 NOTE — Assessment & Plan Note (Signed)
 Lab Results  Component Value Date   LDLCALC 24 12/22/2023   Stable, pt to continue current statin zocor  40 mg qd

## 2024-09-07 NOTE — Assessment & Plan Note (Signed)
 With vascular disease PAD  Lab Results  Component Value Date   HGBA1C 9.7 (H) 12/22/2023   Uncontrolled, but more recent A1c 8.2 per endo last month, pt to continue current medical treatment jardiance  25 every day, glucotrol  xl 10 mg every day, tradejenta 5 every day metformin  ER 500 mg - 2 qd and f/u endo as planned

## 2024-09-07 NOTE — Assessment & Plan Note (Signed)
 Mild to mod, Ok for miralax  17 gm every day prn

## 2024-09-07 NOTE — Patient Instructions (Signed)
 You had the flu shot today  Please take all new medication as prescribed - the Miralax  once per day for constipation  Please continue all other medications as before, and refills have been done if requested.  Please have the pharmacy call with any other refills you may need.  Please continue your efforts at being more active, low cholesterol diet, and weight control.  You are otherwise up to date with prevention measures today.  Please keep your appointments with your specialists as you may have planned - endocrinology  We can hold on further lab tests today  Please make an Appointment to return in 6 months, or sooner if needed

## 2024-09-07 NOTE — Assessment & Plan Note (Signed)
 Mild, for tylenol  prn, and f/u sports medicine on first floor if worsens

## 2024-09-07 NOTE — Progress Notes (Signed)
 Patient ID: Keith Herrera, male   DOB: 15-May-1932, 89 y.o.   MRN: [id number]        Chief Complaint: follow up left achilles tendonitis, constipation, DM,  hld, htn       HPI:  Keith Herrera is a 89 y.o. male here overall doing quite nicely it seems, except for complaint today of mild achilles insertion site left heel pain without swelling.  Pt denies chest pain, increased sob or doe, wheezing, orthopnea, PND, increased LE swelling, palpitations, dizziness or syncope.   Pt denies polydipsia, polyuria, or new focal neuro s/s.    Pt denies fever, wt loss, night sweats, loss of appetite, or other constitutional symptoms    Due for flu shot today.  Also has mild intermittent constipation recently despite metamucil Wt Readings from Last 3 Encounters:  09/07/24 148 lb (67.1 kg)  06/22/24 145 lb (65.8 kg)  03/12/24 145 lb (65.8 kg)   BP Readings from Last 3 Encounters:  09/07/24 126/72  03/12/24 120/62  03/08/24 120/62         Past Medical History:  Diagnosis Date   Adenomatous polyp of colon 05/2004   Allergic rhinitis    Anxiety    Aortic insufficiency 03/01/2022   Echocardiogram 02/2022: EF 60-65, no RWMA, Gr 2 DD, normal RVSF, mild to mod LAE, mild MR, mild AI, AV sclerosis w/o AS   BPH (benign prostatic hyperplasia)    CAD (coronary artery disease)    s/p Cypher DES to LAD and pD1 2010;  LHC was done 6/12: EF 65%, circumflex patent, mid RCA 80-90% (small and nondominant), LAD and diagonal stents patent, LAD at the origin of the first diagonal 30%, ostial D2 90%, mid 80-90% (small vessel).  Continued medical therapy was recommended.    Carotid stenosis    dopplers 02/2012: 0-39% RICA; 40-59% LICA   Coronary artery disease involving native coronary artery of native heart with angina pectoris 11/02/2008   Myoview  02/2022: EF 76, no ischemia or infarction; low risk     Diabetes type 2, controlled (HCC)    Diverticulosis    colon   ED (erectile dysfunction)    Esophageal stricture     GERD (gastroesophageal reflux disease)    Hemorrhoids    Hiatal hernia    Hyperlipidemia    Hypertension    IBS (irritable bowel syndrome)    PVC's (premature ventricular contractions) 01/21/2020   Monitor 07/2019: PVC burden 21.6% // Echocardiogram 01/2020: EF 65-70, no RWMA, Gr 2 DD, normal RVSF, RVSP 43.3 mmHg (mod pul HTN), mild LAE, mild MR, trivial AI   Right inguinal hernia    s/p repair in 8/12   SVT (supraventricular tachycardia)    Past Surgical History:  Procedure Laterality Date   CARDIAC CATHETERIZATION N/A 12/10/2016   Procedure: Left Heart Cath and Coronary Angiography;  Surgeon: Candyce GORMAN Reek, MD;  Location: [facility] INVASIVE CV LAB;  Service: Cardiovascular;  Laterality: N/A;   CARDIAC CATHETERIZATION N/A 12/10/2016   Procedure: Coronary Balloon Angioplasty;  Surgeon: Candyce GORMAN Reek, MD;  Location: [facility] INVASIVE CV LAB;  Service: Cardiovascular;  Laterality: N/A;   CARDIAC CATHETERIZATION N/A 12/10/2016   Procedure: Intravascular Pressure Wire/FFR Study;  Surgeon: Candyce GORMAN Reek, MD;  Location: [facility] INVASIVE CV LAB;  Service: Cardiovascular;  Laterality: N/A;   cataract surgery  2020   CORONARY STENT PLACEMENT  10/18/2008   x 2   ELECTROPHYSIOLOGIC STUDY N/A 03/20/2015   no inducible SVT - Dr Waddell   INGUINAL HERNIA REPAIR  Right    RIH with ultrapro patch    reports that he quit smoking about 68 years ago. His smoking use included cigarettes. He has never used smokeless tobacco. He reports that he does not drink alcohol and does not use drugs. family history includes Bladder Cancer (age of onset: 74) in his mother; Healthy in his daughter and son; Heart attack (age of onset: 55) in his father; Pancreatic cancer (age of onset: 45) in his sister. Allergies  Allergen Reactions   Crestor [Rosuvastatin Calcium ] Rash    All over body   Lovastatin Itching and Rash   Current Outpatient Medications on File Prior to Visit  Medication Sig Dispense Refill   ACCU-CHEK  AVIVA PLUS test strip USE FOR TESTING UP TO 4 TIMES DAILY AS DIRECTED 100 strip 2   aspirin  EC 81 MG tablet Take 81 mg by mouth daily after supper.     blood glucose meter kit and supplies Dispense based on patient and insurance preference. Use up to four times daily as directed. (FOR ICD-10 E10.9, E11.9). 1 each 0   Blood Glucose Monitoring Suppl (ONE TOUCH ULTRA 2) w/Device KIT Use as directed E 11.9 1 each 0   brimonidine  (ALPHAGAN ) 0.2 % ophthalmic solution Administer 1 drop to both eyes Three (3) times a day.     brimonidine -timolol (COMBIGAN) 0.2-0.5 % ophthalmic solution Place into both eyes every 12 (twelve) hours.     cefdinir  (OMNICEF ) 300 MG capsule Take 1 capsule (300 mg total) by mouth 2 (two) times daily. 20 capsule 0   dorzolamide-timolol (COSOPT) 2-0.5 % ophthalmic solution 1 drop 2 (two) times daily.     empagliflozin  (JARDIANCE ) 25 MG TABS tablet Take 1 tablet (25 mg total) by mouth daily before breakfast. 90 tablet 3   finasteride  (PROSCAR ) 5 MG tablet TAKE ONE TABLET BY MOUTH EVERY DAY 90 tablet 3   gabapentin  (NEURONTIN ) 300 MG capsule TAKE TWO CAPSULES BY MOUTH AT BEDTIME 180 capsule 0   glipiZIDE  (GLUCOTROL  XL) 10 MG 24 hr tablet TAKE ONE TABLET BY MOUTH EVERY DAY WITH BREAKFAST 90 tablet 3   hydrOXYzine  (VISTARIL ) 25 MG capsule TAKE 1-2 CAPSULES BY MOUTH AT BEDTIME AS NEEDED FOR ITCHING AND SLEEP 180 capsule 1   isosorbide  mononitrate (IMDUR ) 30 MG 24 hr tablet TAKE ONE-HALF TABLET BY MOUTH EVERY DAY 45 tablet 2   Lancets MISC Use as directed twice daily E11.9 200 each 3   latanoprost  (XALATAN ) 0.005 % ophthalmic solution PLACE ONE drop into BOTH eyes nightly 7.5 mL 1   linagliptin  (TRADJENTA ) 5 MG TABS tablet Take 1 tablet (5 mg total) by mouth daily.     losartan  (COZAAR ) 100 MG tablet TAKE ONE TABLET BY MOUTH EVERY DAY 90 tablet 2   meclizine  (ANTIVERT ) 12.5 MG tablet Take 1 tablet (12.5 mg total) by mouth 3 (three) times daily as needed. 40 tablet 1   metFORMIN   (GLUCOPHAGE -XR) 500 MG 24 hr tablet TAKE TWO TABLETS BY MOUTH EVERY DAY WITH BREAKFAST 180 tablet 2   metoprolol  succinate (TOPROL -XL) 25 MG 24 hr tablet TAKE ONE-HALF TABLET BY MOUTH EVERY DAY 45 tablet 3   nitroGLYCERIN  (NITROSTAT ) 0.4 MG SL tablet PLACE 1 TABLET UNDER THE TONGUE EVERY 5 MINUTES AS NEEDED FOR CHEST PAIN 25 tablet 2   omeprazole  (PRILOSEC) 40 MG capsule TAKE ONE CAPSULE BY MOUTH EVERY DAY 90 capsule 1   predniSONE  (DELTASONE ) 10 MG tablet 2 tabs by mouth per day for 5 days 10 tablet 0   promethazine -dextromethorphan (  PROMETHAZINE -DM) 6.25-15 MG/5ML syrup Take 5 mLs by mouth 4 (four) times daily as needed. 118 mL 1   RYBELSUS 7 MG TABS Take 1 tablet by mouth every morning.     simvastatin  (ZOCOR ) 40 MG tablet TAKE ONE TABLET BY MOUTH AT BEDTIME 90 tablet 0   triamcinolone  cream (KENALOG ) 0.1 % Apply 1 Application topically 2 (two) times daily. 453.6 g 1   No current facility-administered medications on file prior to visit.        ROS:  All others reviewed and negative.  Objective        PE:  BP 126/72 (BP Location: Right Arm, Patient Position: Sitting, Cuff Size: Normal)   Pulse (!) 56   Temp 97.9 F (36.6 C) (Oral)   Ht 5' 10 (1.778 m)   Wt 148 lb (67.1 kg)   SpO2 98%   BMI 21.24 kg/m                 Constitutional: Pt appears in NAD               HENT: Head: NCAT.                Right Ear: External ear normal.                 Left Ear: External ear normal.                Eyes: . Pupils are equal, round, and reactive to light. Conjunctivae and EOM are normal               Nose: without d/c or deformity               Neck: Neck supple. Gross normal ROM               Cardiovascular: Normal rate and regular rhythm.                 Pulmonary/Chest: Effort normal and breath sounds without rales or wheezing.                Abd:  Soft, NT, ND, + BS, no organomegaly               Neurological: Pt is alert. At baseline orientation, motor grossly intact                Skin: Skin is warm. No rashes, no other new lesions, LE edema - none, but has mild tender left post heel achilles insertion area without swelling               Psychiatric: Pt behavior is normal without agitation   Micro: none  Cardiac tracings I have personally interpreted today:  none  Pertinent Radiological findings (summarize): none   Lab Results  Component Value Date   WBC 8.4 12/22/2023   HGB 14.1 12/22/2023   HCT 41.6 12/22/2023   PLT 255.0 12/22/2023   GLUCOSE 184 (H) 12/22/2023   CHOL 101 12/22/2023   TRIG 235.0 (H) 12/22/2023   HDL 29.50 (L) 12/22/2023   LDLDIRECT 65.0 03/07/2023   LDLCALC 24 12/22/2023   ALT 20 12/22/2023   AST 16 12/22/2023   NA 141 12/22/2023   K 4.2 12/22/2023   CL 105 12/22/2023   CREATININE 0.95 12/22/2023   BUN 22 12/22/2023   CO2 28 12/22/2023   TSH 3.18 12/22/2023   PSA 2.49 12/21/2012   INR 1.15 12/10/2016   HGBA1C 9.7 (H) 12/22/2023  MICROALBUR 1.0 12/21/2009   Assessment/Plan:  Keith Herrera is a 89 y.o. White or Caucasian [1] male with  has a past medical history of Adenomatous polyp of colon (05/2004), Allergic rhinitis, Anxiety, Aortic insufficiency (03/01/2022), BPH (benign prostatic hyperplasia), CAD (coronary artery disease), Carotid stenosis, Coronary artery disease involving native coronary artery of native heart with angina pectoris (11/02/2008), Diabetes type 2, controlled (HCC), Diverticulosis, ED (erectile dysfunction), Esophageal stricture, GERD (gastroesophageal reflux disease), Hemorrhoids, Hiatal hernia, Hyperlipidemia, Hypertension, IBS (irritable bowel syndrome), PVC's (premature ventricular contractions) (01/21/2020), Right inguinal hernia, and SVT (supraventricular tachycardia).  Achilles tendinitis Mild, for tylenol  prn, and f/u sports medicine on first floor if worsens  Type 2 diabetes mellitus with vascular disease (HCC) With vascular disease PAD  Lab Results  Component Value Date   HGBA1C 9.7 (H) 12/22/2023    Uncontrolled, but more recent A1c 8.2 per endo last month, pt to continue current medical treatment jardiance  25 every day, glucotrol  xl 10 mg every day, tradejenta 5 every day metformin  ER 500 mg - 2 qd and f/u endo as planned   Hyperlipidemia LDL goal <70 Lab Results  Component Value Date   LDLCALC 24 12/22/2023   Stable, pt to continue current statin zocor  40 mg qd   Essential hypertension BP Readings from Last 3 Encounters:  09/07/24 126/72  03/12/24 120/62  03/08/24 120/62   Stable, pt to continue medical treatment losartan  100 mg every day, toprol  xl 25 qd   Constipation Mild to mod, Ok for miralax  17 gm every day prn  Followup: Return in about 6 months (around 03/08/2025).  Lynwood Rush, MD 09/07/2024 1:59 PM [facility]

## 2024-11-04 ENCOUNTER — Other Ambulatory Visit: Payer: Self-pay

## 2024-11-04 ENCOUNTER — Other Ambulatory Visit: Payer: Self-pay | Admitting: Internal Medicine

## 2024-11-05 ENCOUNTER — Other Ambulatory Visit: Payer: Self-pay

## 2025-03-08 ENCOUNTER — Ambulatory Visit: Admitting: Internal Medicine

## 2025-06-23 ENCOUNTER — Ambulatory Visit
